# Patient Record
Sex: Female | Born: 1950
Health system: Southern US, Community
[De-identification: ages and names within clinical notes are randomized; demographics above are authoritative.]

## PROBLEM LIST (undated history)

## (undated) DIAGNOSIS — T7840XA Allergy, unspecified, initial encounter: Secondary | ICD-10-CM

## (undated) DIAGNOSIS — F419 Anxiety disorder, unspecified: Secondary | ICD-10-CM

## (undated) DIAGNOSIS — I10 Essential (primary) hypertension: Secondary | ICD-10-CM

## (undated) DIAGNOSIS — E785 Hyperlipidemia, unspecified: Secondary | ICD-10-CM

## (undated) DIAGNOSIS — E278 Other specified disorders of adrenal gland: Secondary | ICD-10-CM

## (undated) HISTORY — PX: OTHER SURGICAL HISTORY: SHX169

## (undated) HISTORY — PX: TONSILLECTOMY: SUR1361

## (undated) HISTORY — DX: Essential (primary) hypertension: I10

## (undated) HISTORY — PX: TUBAL LIGATION: SHX77

## (undated) HISTORY — DX: Other specified disorders of adrenal gland: E27.8

## (undated) HISTORY — PX: ECTOPIC PREGNANCY SURGERY: SHX613

## (undated) HISTORY — DX: Allergy, unspecified, initial encounter: T78.40XA

## (undated) HISTORY — DX: Hyperlipidemia, unspecified: E78.5

---

## 1998-02-22 ENCOUNTER — Ambulatory Visit (HOSPITAL_COMMUNITY): Admission: RE | Admit: 1998-02-22 | Discharge: 1998-02-22 | Payer: Self-pay

## 1999-02-08 ENCOUNTER — Other Ambulatory Visit: Admission: RE | Admit: 1999-02-08 | Discharge: 1999-02-08 | Payer: Self-pay | Admitting: *Deleted

## 1999-02-17 ENCOUNTER — Encounter: Admission: RE | Admit: 1999-02-17 | Discharge: 1999-02-17 | Payer: Self-pay | Admitting: Obstetrics

## 1999-02-17 ENCOUNTER — Other Ambulatory Visit: Admission: RE | Admit: 1999-02-17 | Discharge: 1999-02-17 | Payer: Self-pay | Admitting: Obstetrics

## 1999-03-14 ENCOUNTER — Encounter: Admission: RE | Admit: 1999-03-14 | Discharge: 1999-03-14 | Payer: Self-pay | Admitting: Internal Medicine

## 1999-05-24 ENCOUNTER — Other Ambulatory Visit: Admission: RE | Admit: 1999-05-24 | Discharge: 1999-05-24 | Payer: Self-pay | Admitting: Obstetrics & Gynecology

## 1999-05-24 ENCOUNTER — Encounter: Admission: RE | Admit: 1999-05-24 | Discharge: 1999-05-24 | Payer: Self-pay | Admitting: Obstetrics & Gynecology

## 1999-06-29 ENCOUNTER — Encounter: Admission: RE | Admit: 1999-06-29 | Discharge: 1999-06-29 | Payer: Self-pay | Admitting: Internal Medicine

## 1999-07-08 ENCOUNTER — Encounter: Admission: RE | Admit: 1999-07-08 | Discharge: 1999-07-08 | Payer: Self-pay | Admitting: Obstetrics & Gynecology

## 1999-10-07 ENCOUNTER — Encounter: Admission: RE | Admit: 1999-10-07 | Discharge: 1999-10-07 | Payer: Self-pay | Admitting: Internal Medicine

## 1999-10-10 ENCOUNTER — Encounter: Admission: RE | Admit: 1999-10-10 | Discharge: 1999-10-10 | Payer: Self-pay | Admitting: Obstetrics

## 2000-03-06 ENCOUNTER — Encounter: Admission: RE | Admit: 2000-03-06 | Discharge: 2000-03-06 | Payer: Self-pay | Admitting: Obstetrics & Gynecology

## 2000-04-12 ENCOUNTER — Encounter: Admission: RE | Admit: 2000-04-12 | Discharge: 2000-04-12 | Payer: Self-pay | Admitting: Internal Medicine

## 2001-11-19 ENCOUNTER — Other Ambulatory Visit: Admission: RE | Admit: 2001-11-19 | Discharge: 2001-11-19 | Payer: Self-pay | Admitting: Obstetrics and Gynecology

## 2001-11-22 ENCOUNTER — Encounter: Admission: RE | Admit: 2001-11-22 | Discharge: 2001-11-22 | Payer: Self-pay | Admitting: Obstetrics and Gynecology

## 2001-11-22 ENCOUNTER — Encounter: Payer: Self-pay | Admitting: Obstetrics and Gynecology

## 2002-11-21 ENCOUNTER — Other Ambulatory Visit: Admission: RE | Admit: 2002-11-21 | Discharge: 2002-11-21 | Payer: Self-pay | Admitting: Obstetrics and Gynecology

## 2003-11-30 ENCOUNTER — Encounter: Admission: RE | Admit: 2003-11-30 | Discharge: 2003-11-30 | Payer: Self-pay | Admitting: Internal Medicine

## 2003-12-08 ENCOUNTER — Encounter: Admission: RE | Admit: 2003-12-08 | Discharge: 2003-12-08 | Payer: Self-pay | Admitting: Internal Medicine

## 2003-12-15 ENCOUNTER — Other Ambulatory Visit: Admission: RE | Admit: 2003-12-15 | Discharge: 2003-12-15 | Payer: Self-pay | Admitting: Obstetrics and Gynecology

## 2005-03-23 ENCOUNTER — Encounter: Admission: RE | Admit: 2005-03-23 | Discharge: 2005-03-23 | Payer: Self-pay | Admitting: Family Medicine

## 2006-04-06 ENCOUNTER — Ambulatory Visit: Payer: Self-pay | Admitting: Family Medicine

## 2006-05-03 ENCOUNTER — Ambulatory Visit: Payer: Self-pay | Admitting: Family Medicine

## 2006-05-17 ENCOUNTER — Ambulatory Visit: Payer: Self-pay | Admitting: Family Medicine

## 2006-06-22 ENCOUNTER — Ambulatory Visit: Payer: Self-pay | Admitting: Family Medicine

## 2006-08-23 ENCOUNTER — Ambulatory Visit: Payer: Self-pay | Admitting: Family Medicine

## 2006-10-15 ENCOUNTER — Ambulatory Visit: Payer: Self-pay | Admitting: Family Medicine

## 2006-10-16 ENCOUNTER — Encounter: Admission: RE | Admit: 2006-10-16 | Discharge: 2006-10-16 | Payer: Self-pay | Admitting: Family Medicine

## 2006-11-15 ENCOUNTER — Ambulatory Visit: Payer: Self-pay | Admitting: Family Medicine

## 2006-11-21 ENCOUNTER — Ambulatory Visit: Payer: Self-pay | Admitting: Family Medicine

## 2006-12-06 ENCOUNTER — Ambulatory Visit: Payer: Self-pay | Admitting: Family Medicine

## 2007-01-03 ENCOUNTER — Ambulatory Visit: Payer: Self-pay | Admitting: Family Medicine

## 2007-02-19 ENCOUNTER — Ambulatory Visit: Payer: Self-pay | Admitting: Family Medicine

## 2007-02-19 ENCOUNTER — Encounter: Admission: RE | Admit: 2007-02-19 | Discharge: 2007-02-19 | Payer: Self-pay | Admitting: Obstetrics and Gynecology

## 2007-04-02 ENCOUNTER — Ambulatory Visit: Payer: Self-pay | Admitting: Family Medicine

## 2007-04-25 ENCOUNTER — Ambulatory Visit: Payer: Self-pay | Admitting: Family Medicine

## 2007-04-26 ENCOUNTER — Encounter: Admission: RE | Admit: 2007-04-26 | Discharge: 2007-04-26 | Payer: Self-pay | Admitting: Family Medicine

## 2007-05-14 ENCOUNTER — Ambulatory Visit: Payer: Self-pay | Admitting: Family Medicine

## 2007-08-15 ENCOUNTER — Ambulatory Visit: Payer: Self-pay | Admitting: Family Medicine

## 2008-02-21 ENCOUNTER — Encounter: Admission: RE | Admit: 2008-02-21 | Discharge: 2008-02-21 | Payer: Self-pay | Admitting: Internal Medicine

## 2008-05-12 ENCOUNTER — Ambulatory Visit: Payer: Self-pay | Admitting: Internal Medicine

## 2008-05-13 ENCOUNTER — Ambulatory Visit: Payer: Self-pay | Admitting: *Deleted

## 2008-05-14 ENCOUNTER — Ambulatory Visit: Payer: Self-pay | Admitting: Internal Medicine

## 2008-05-14 LAB — CONVERTED CEMR LAB
Albumin: 4.5 g/dL (ref 3.5–5.2)
Alkaline Phosphatase: 103 units/L (ref 39–117)
Calcium: 9.4 mg/dL (ref 8.4–10.5)
Cholesterol: 192 mg/dL (ref 0–200)
Creatinine, Ser: 0.78 mg/dL (ref 0.40–1.20)
Glucose, Bld: 99 mg/dL (ref 70–99)
LDL Cholesterol: 124 mg/dL — ABNORMAL HIGH (ref 0–99)
Potassium: 3.4 meq/L — ABNORMAL LOW (ref 3.5–5.3)
Total Bilirubin: 0.5 mg/dL (ref 0.3–1.2)
Triglycerides: 94 mg/dL (ref <150)
VLDL: 19 mg/dL (ref 0–40)

## 2008-06-24 ENCOUNTER — Ambulatory Visit: Payer: Self-pay | Admitting: Internal Medicine

## 2008-07-29 ENCOUNTER — Ambulatory Visit: Payer: Self-pay | Admitting: Internal Medicine

## 2008-08-19 ENCOUNTER — Ambulatory Visit: Payer: Self-pay | Admitting: Internal Medicine

## 2008-08-19 LAB — CONVERTED CEMR LAB
ALT: 9 units/L (ref 0–35)
AST: 18 units/L (ref 0–37)
Albumin: 4.7 g/dL (ref 3.5–5.2)
Alkaline Phosphatase: 113 units/L (ref 39–117)
CO2: 23 meq/L (ref 19–32)
Calcium: 9.6 mg/dL (ref 8.4–10.5)
Chloride: 104 meq/L (ref 96–112)
Creatinine, Ser: 0.86 mg/dL (ref 0.40–1.20)
Total Protein: 7.6 g/dL (ref 6.0–8.3)

## 2008-09-01 ENCOUNTER — Emergency Department (HOSPITAL_COMMUNITY): Admission: EM | Admit: 2008-09-01 | Discharge: 2008-09-01 | Payer: Self-pay | Admitting: Emergency Medicine

## 2008-09-25 ENCOUNTER — Ambulatory Visit: Payer: Self-pay | Admitting: Internal Medicine

## 2008-12-17 ENCOUNTER — Ambulatory Visit: Payer: Self-pay | Admitting: Internal Medicine

## 2009-02-19 ENCOUNTER — Telehealth (INDEPENDENT_AMBULATORY_CARE_PROVIDER_SITE_OTHER): Payer: Self-pay | Admitting: *Deleted

## 2009-02-25 ENCOUNTER — Ambulatory Visit: Payer: Self-pay | Admitting: Internal Medicine

## 2009-04-01 ENCOUNTER — Encounter: Payer: Self-pay | Admitting: Internal Medicine

## 2009-04-01 ENCOUNTER — Ambulatory Visit: Payer: Self-pay | Admitting: Family Medicine

## 2009-04-01 DIAGNOSIS — I10 Essential (primary) hypertension: Secondary | ICD-10-CM | POA: Insufficient documentation

## 2009-04-01 DIAGNOSIS — M5416 Radiculopathy, lumbar region: Secondary | ICD-10-CM | POA: Insufficient documentation

## 2009-04-01 DIAGNOSIS — M1990 Unspecified osteoarthritis, unspecified site: Secondary | ICD-10-CM | POA: Insufficient documentation

## 2009-04-23 ENCOUNTER — Ambulatory Visit: Payer: Self-pay | Admitting: Family Medicine

## 2009-04-29 ENCOUNTER — Ambulatory Visit: Payer: Self-pay | Admitting: Internal Medicine

## 2009-05-20 ENCOUNTER — Ambulatory Visit: Payer: Self-pay | Admitting: Internal Medicine

## 2009-07-01 ENCOUNTER — Ambulatory Visit: Payer: Self-pay | Admitting: Internal Medicine

## 2009-07-01 LAB — CONVERTED CEMR LAB
BUN: 17 mg/dL (ref 6–23)
Cholesterol: 205 mg/dL — ABNORMAL HIGH (ref 0–200)
HDL: 49 mg/dL (ref >39)
LDL Cholesterol: 134 mg/dL — ABNORMAL HIGH (ref 0–99)
Potassium: 4.2 meq/L (ref 3.5–5.3)
Sodium: 140 meq/L (ref 135–145)
TSH: 0.98 microintl units/mL (ref 0.350–4.500)
Total CHOL/HDL Ratio: 4.2
Triglycerides: 110 mg/dL (ref <150)
VLDL: 22 mg/dL (ref 0–40)

## 2009-07-07 ENCOUNTER — Ambulatory Visit: Payer: Self-pay | Admitting: Internal Medicine

## 2009-07-07 ENCOUNTER — Encounter: Payer: Self-pay | Admitting: Internal Medicine

## 2009-07-13 ENCOUNTER — Ambulatory Visit (HOSPITAL_COMMUNITY): Admission: RE | Admit: 2009-07-13 | Discharge: 2009-07-13 | Payer: Self-pay | Admitting: Internal Medicine

## 2009-08-11 ENCOUNTER — Ambulatory Visit: Payer: Self-pay | Admitting: Internal Medicine

## 2009-09-10 ENCOUNTER — Ambulatory Visit: Payer: Self-pay | Admitting: Family Medicine

## 2009-10-05 ENCOUNTER — Ambulatory Visit: Payer: Self-pay | Admitting: Internal Medicine

## 2009-10-07 ENCOUNTER — Ambulatory Visit: Payer: Self-pay | Admitting: Internal Medicine

## 2009-10-07 LAB — CONVERTED CEMR LAB
Cholesterol: 195 mg/dL (ref 0–200)
LDL Cholesterol: 130 mg/dL — ABNORMAL HIGH (ref 0–99)

## 2009-10-21 ENCOUNTER — Encounter (INDEPENDENT_AMBULATORY_CARE_PROVIDER_SITE_OTHER): Payer: Self-pay | Admitting: Internal Medicine

## 2009-11-29 ENCOUNTER — Ambulatory Visit: Payer: Self-pay | Admitting: Internal Medicine

## 2010-02-07 ENCOUNTER — Ambulatory Visit: Payer: Self-pay | Admitting: Internal Medicine

## 2010-02-07 LAB — CONVERTED CEMR LAB
CO2: 18 meq/L — ABNORMAL LOW (ref 19–32)
Calcium: 10.1 mg/dL (ref 8.4–10.5)
Glucose, Bld: 90 mg/dL (ref 70–99)
Hgb A1c MFr Bld: 5.9 % (ref 4.6–6.1)
Potassium: 4 meq/L (ref 3.5–5.3)

## 2010-05-17 ENCOUNTER — Ambulatory Visit: Payer: Self-pay | Admitting: Internal Medicine

## 2010-06-21 ENCOUNTER — Ambulatory Visit: Payer: Self-pay | Admitting: Internal Medicine

## 2010-06-24 ENCOUNTER — Ambulatory Visit (HOSPITAL_COMMUNITY): Admission: RE | Admit: 2010-06-24 | Discharge: 2010-06-24 | Payer: Self-pay | Admitting: Internal Medicine

## 2010-07-22 ENCOUNTER — Ambulatory Visit (HOSPITAL_COMMUNITY): Admission: RE | Admit: 2010-07-22 | Discharge: 2010-07-22 | Payer: Self-pay | Admitting: Family Medicine

## 2010-09-26 ENCOUNTER — Ambulatory Visit (HOSPITAL_COMMUNITY): Admission: RE | Admit: 2010-09-26 | Discharge: 2010-09-26 | Payer: Self-pay | Admitting: Internal Medicine

## 2010-11-24 ENCOUNTER — Encounter (INDEPENDENT_AMBULATORY_CARE_PROVIDER_SITE_OTHER): Payer: Self-pay | Admitting: Family Medicine

## 2010-11-24 LAB — CONVERTED CEMR LAB
ALT: 9 units/L (ref 0–35)
AST: 16 units/L (ref 0–37)
CO2: 27 meq/L (ref 19–32)
Cholesterol: 195 mg/dL (ref 0–200)
Creatinine, Ser: 0.82 mg/dL (ref 0.40–1.20)
HDL: 51 mg/dL (ref >39)
LDL Cholesterol: 124 mg/dL — ABNORMAL HIGH (ref 0–99)
Sodium: 138 meq/L (ref 135–145)
Total Bilirubin: 0.9 mg/dL (ref 0.3–1.2)
Total Protein: 7.6 g/dL (ref 6.0–8.3)
Triglycerides: 101 mg/dL (ref <150)
VLDL: 20 mg/dL (ref 0–40)

## 2010-11-29 ENCOUNTER — Encounter: Payer: Self-pay | Admitting: Cardiology

## 2010-11-29 ENCOUNTER — Ambulatory Visit
Admission: RE | Admit: 2010-11-29 | Discharge: 2010-11-29 | Payer: Self-pay | Source: Home / Self Care | Attending: Cardiology | Admitting: Cardiology

## 2010-11-29 DIAGNOSIS — R079 Chest pain, unspecified: Secondary | ICD-10-CM | POA: Insufficient documentation

## 2010-12-08 NOTE — Assessment & Plan Note (Signed)
Summary: chest pain/mt   Visit Type:  Initial Consult Primary Provider:  [facility]  CC:  chest pain.  History of Present Illness: The patient is seen in consultation for evaluation of chest pain.  She has no known cardiovascular disease.  She does have hypertension. she does not smoke.  She does not have diabetes.  Her mother had coronary disease and underwent CABG.  Her LDL is 124.  HDL is 51.  She says that she has chest discomfort at times.  It can occur at rest or with stress.  There is no nausea vomiting or diaphoresis.  She's had no syncope or presyncope.  The discomfort occurs substernal.  It is short in duration.  Preventive Screening-Counseling & Management  Alcohol-Tobacco     Smoking Status: never  Caffeine-Diet-Exercise     Does Patient Exercise: yes      Drug Use:  no.    Current Medications (verified): 1)  Flexeril 10 Mg Tabs (Cyclobenzaprine Hcl) .... Take 1 Tablet By Mouth Every 8 Hours As Needed 2)  Hydrochlorothiazide 25 Mg Tabs (Hydrochlorothiazide) .... Take One Tablet By Mouth Daily. 3)  Fish Oil   Oil (Fish Oil) .... Three Times A Day 4)  Krill Oil 1000 Mg Caps (Krill Oil) .... Once Daily 5)  Vitamin D3 5000 Unit Caps (Cholecalciferol) .... Once Daily 6)  Vitamin B-12 500 Mcg  Tabs (Cyanocobalamin) .... Once Daily 7)  Vitamin C 500 Mg  Tabs (Ascorbic Acid) .... Once Daily 8)  Gabapentin 300 Mg Caps (Gabapentin) .... Take 1 Capsule By Mouth Once A Day 9)  Potassium Chloride Crys Cr 20 Meq Cr-Tabs (Potassium Chloride Crys Cr) .... Take One Tablet By Mouth Daily 10)  Losartan Potassium 100 Mg Tabs (Losartan Potassium) .... Take 1 Tablet By Mouth Once A Day 11)  Amlodipine Besylate 10 Mg Tabs (Amlodipine Besylate) .... Take One Tablet By Mouth Daily 12)  Tramadol Hcl 50 Mg Tabs (Tramadol Hcl) .... Take 1 Tablet By Mouth Three Times A Day  Allergies (verified): 1)  ! Ace Inhibitors  Past History:  Past Medical History: Hypertension Osteoarthritis Back  pain Lipitor.... leg pain Chest pain Thyroid cyst   September 26, 2010( insignificant)  Family History: Family History of Cancer:  Family History of Coronary Artery Disease:  Family History of Diabetes:  Family History of Hypertension:   Social History: Divorced  Tobacco Use - No.  Alcohol Use - yes -- ocasional Regular Exercise - yes Drug Use - no Smoking Status:  never Does Patient Exercise:  yes Drug Use:  no  Review of Systems       The patient denies fever, chills, headache, sweats, rash, change in vision, change in hearing, cough, nausea vomiting, urinary symptoms.  All of the systems are reviewed and are negative  Vital Signs:  Patient profile:   60 year old female Height:      63 inches Weight:      187 pounds BMI:     33.25 Pulse rate:   84 / minute BP sitting:   142 / 78  (left arm) Cuff size:   regular  Vitals Entered By: Hardin Negus, RMA (November 29, 2010 2:07 PM)  Physical Exam  General:  patient is overweight but stable. Head:  head is atraumatic. Eyes:  no xanthelasma. Neck:  no jugular venous distention. Chest Wall:  no chest wall tenderness. Lungs:  lungs are clear.  Respiratory effort is not labored. Heart:  cardiac exam reveals a normal S2.  No clicks  or significant murmurs. Abdomen:  abdomen is soft. Msk:  no musculoskeletal deformities. Extremities:  no peripheral edema. Skin:  no skin rashes. Psych:  patient is oriented to person time and place.  Affect is normal.   Impression & Recommendations:  Problem # 1:  CHEST PAIN (ICD-786.50)  The patient does have some risk factors for coronary disease.  Her Framingham risk score for significant coronary disease in the next 10 years is less than 10%.  She is at low risk.  Her pain is not typical.  EKG is done and reviewed by me.  There is no diagnostic abnormalities.  We will proceed with a standard exercise tolerance test.  Orders: Treadmill (Treadmill)  Problem # 2:  HYPERTENSION  (ICD-401.9) Blood pressure is controlled today.  No change in therapy.  Problem # 3:  BACK PAIN, LUMBAR, WITH RADICULOPATHY (ICD-724.4) I feel that her back pain is not vascular in origin.  I will see her back to proceed with the treadmill.  Other Orders: EKG w/ Interpretation (93000)  Patient Instructions: 1)  Your physician has requested that you have an exercise tolerance test.  For further information please visit [email or web address]  Please also follow instruction sheet, as given.

## 2010-12-16 ENCOUNTER — Ambulatory Visit (HOSPITAL_COMMUNITY)
Admission: RE | Admit: 2010-12-16 | Discharge: 2010-12-16 | Disposition: A | Discharge status: Home / Self Care | Payer: Self-pay | Source: Ambulatory Visit | Attending: Family Medicine | Admitting: Family Medicine

## 2010-12-16 ENCOUNTER — Other Ambulatory Visit: Payer: Self-pay | Admitting: Family Medicine

## 2010-12-16 DIAGNOSIS — R52 Pain, unspecified: Secondary | ICD-10-CM

## 2010-12-16 DIAGNOSIS — R0789 Other chest pain: Secondary | ICD-10-CM | POA: Insufficient documentation

## 2010-12-19 ENCOUNTER — Encounter: Payer: Self-pay | Admitting: Cardiology

## 2010-12-19 ENCOUNTER — Encounter (INDEPENDENT_AMBULATORY_CARE_PROVIDER_SITE_OTHER): Payer: PRIVATE HEALTH INSURANCE | Admitting: Cardiology

## 2010-12-19 ENCOUNTER — Encounter (INDEPENDENT_AMBULATORY_CARE_PROVIDER_SITE_OTHER): Payer: PRIVATE HEALTH INSURANCE

## 2010-12-19 DIAGNOSIS — R0989 Other specified symptoms and signs involving the circulatory and respiratory systems: Secondary | ICD-10-CM

## 2010-12-19 DIAGNOSIS — R079 Chest pain, unspecified: Secondary | ICD-10-CM

## 2011-03-07 ENCOUNTER — Other Ambulatory Visit (HOSPITAL_COMMUNITY): Payer: Self-pay | Admitting: Family Medicine

## 2011-03-07 DIAGNOSIS — Z1231 Encounter for screening mammogram for malignant neoplasm of breast: Secondary | ICD-10-CM

## 2011-07-24 ENCOUNTER — Ambulatory Visit (HOSPITAL_COMMUNITY)
Admission: RE | Admit: 2011-07-24 | Discharge: 2011-07-24 | Disposition: A | Discharge status: Home / Self Care | Payer: Self-pay | Source: Ambulatory Visit | Attending: Family Medicine | Admitting: Family Medicine

## 2011-07-24 DIAGNOSIS — Z1231 Encounter for screening mammogram for malignant neoplasm of breast: Secondary | ICD-10-CM | POA: Insufficient documentation

## 2011-08-07 ENCOUNTER — Other Ambulatory Visit: Payer: Self-pay | Admitting: Family Medicine

## 2011-10-26 ENCOUNTER — Other Ambulatory Visit: Payer: Self-pay | Admitting: Family Medicine

## 2011-10-26 DIAGNOSIS — N644 Mastodynia: Secondary | ICD-10-CM

## 2011-11-13 ENCOUNTER — Ambulatory Visit
Admission: RE | Admit: 2011-11-13 | Discharge: 2011-11-13 | Disposition: A | Discharge status: Home / Self Care | Payer: Self-pay | Source: Ambulatory Visit | Attending: Family Medicine | Admitting: Family Medicine

## 2011-11-13 DIAGNOSIS — N644 Mastodynia: Secondary | ICD-10-CM

## 2012-06-03 ENCOUNTER — Other Ambulatory Visit: Payer: Self-pay | Admitting: Family Medicine

## 2012-07-30 ENCOUNTER — Encounter: Payer: Self-pay | Admitting: Internal Medicine

## 2012-07-30 ENCOUNTER — Ambulatory Visit (INDEPENDENT_AMBULATORY_CARE_PROVIDER_SITE_OTHER): Payer: Self-pay | Admitting: Internal Medicine

## 2012-07-30 VITALS — BP 141/85 | HR 79 | Temp 98.1°F | Ht 63.0 in | Wt 188.4 lb

## 2012-07-30 DIAGNOSIS — M899 Disorder of bone, unspecified: Secondary | ICD-10-CM

## 2012-07-30 DIAGNOSIS — N644 Mastodynia: Secondary | ICD-10-CM | POA: Insufficient documentation

## 2012-07-30 DIAGNOSIS — Z23 Encounter for immunization: Secondary | ICD-10-CM

## 2012-07-30 DIAGNOSIS — I10 Essential (primary) hypertension: Secondary | ICD-10-CM

## 2012-07-30 DIAGNOSIS — R209 Unspecified disturbances of skin sensation: Secondary | ICD-10-CM

## 2012-07-30 DIAGNOSIS — Z0000 Encounter for general adult medical examination without abnormal findings: Secondary | ICD-10-CM

## 2012-07-30 DIAGNOSIS — M8580 Other specified disorders of bone density and structure, unspecified site: Secondary | ICD-10-CM | POA: Insufficient documentation

## 2012-07-30 DIAGNOSIS — Z87898 Personal history of other specified conditions: Secondary | ICD-10-CM | POA: Insufficient documentation

## 2012-07-30 LAB — CBC
MCH: 29.6 pg (ref 26.0–34.0)
MCHC: 33.8 g/dL (ref 30.0–36.0)
Platelets: 309 10*3/uL (ref 150–400)
RBC: 4.76 MIL/uL (ref 3.87–5.11)
WBC: 5.9 10*3/uL (ref 4.0–10.5)

## 2012-07-30 MED ORDER — AMLODIPINE BESYLATE 10 MG PO TABS: 10.0000 mg | ORAL_TABLET | Freq: Every day | ORAL | Status: DC

## 2012-07-30 MED ORDER — LOSARTAN POTASSIUM-HCTZ 100-25 MG PO TABS: 1.0000 | ORAL_TABLET | Freq: Every day | ORAL | Status: DC

## 2012-07-30 NOTE — Patient Instructions (Signed)
Apply bacitracin ointment to right ear daily for 5-7 days and see if that helps with pain, if no relief call or come to clinic. If severe, go to ED  Take benadryl at night as needed to help with allergies and may help with sleep  We increased the dose of your Losartan-HCTZ medication: 100-25mg  tablet daily and keep taking amlodipine 10mg  daily  Follow up b/l breast ultrasound  Flu vaccine and Tdap given today  Pap smear next visit--follow up one month  Decrease salt intake in your diet and continue medication and continue exercise--try to monitor BP at home  Hypertension As your heart beats, it forces blood through your arteries. This force is your blood pressure. If the pressure is too high, it is called hypertension (HTN) or high blood pressure. HTN is dangerous because you may have it and not know it. High blood pressure may mean that your heart has to work harder to pump blood. Your arteries may be narrow or stiff. The extra work puts you at risk for heart disease, stroke, and other problems.  Blood pressure consists of two numbers, a higher number over a lower, 110/72, for example. It is stated as "110 over 72." The ideal is below 120 for the top number (systolic) and under 80 for the bottom (diastolic). Write down your blood pressure today. You should pay close attention to your blood pressure if you have certain conditions such as:  Heart failure.   Prior heart attack.   Diabetes   Chronic kidney disease.   Prior stroke.   Multiple risk factors for heart disease.  To see if you have HTN, your blood pressure should be measured while you are seated with your arm held at the level of the heart. It should be measured at least twice. A one-time elevated blood pressure reading (especially in the Emergency Department) does not mean that you need treatment. There may be conditions in which the blood pressure is different between your right and left arms. It is important to see your  caregiver soon for a recheck. Most people have essential hypertension which means that there is not a specific cause. This type of high blood pressure may be lowered by changing lifestyle factors such as:  Stress.   Smoking.   Lack of exercise.   Excessive weight.   Drug/tobacco/alcohol use.   Eating less salt.  Most people do not have symptoms from high blood pressure until it has caused damage to the body. Effective treatment can often prevent, delay or reduce that damage. TREATMENT  When a cause has been identified, treatment for high blood pressure is directed at the cause. There are a large number of medications to treat HTN. These fall into several categories, and your caregiver will help you select the medicines that are best for you. Medications may have side effects. You should review side effects with your caregiver. If your blood pressure stays high after you have made lifestyle changes or started on medicines,   Your medication(s) may need to be changed.   Other problems may need to be addressed.   Be certain you understand your prescriptions, and know how and when to take your medicine.   Be sure to follow up with your caregiver within the time frame advised (usually within two weeks) to have your blood pressure rechecked and to review your medications.   If you are taking more than one medicine to lower your blood pressure, make sure you know how and at what  times they should be taken. Taking two medicines at the same time can result in blood pressure that is too low.  SEEK IMMEDIATE MEDICAL CARE IF:  You develop a severe headache, blurred or changing vision, or confusion.   You have unusual weakness or numbness, or a faint feeling.   You have severe chest or abdominal pain, vomiting, or breathing problems.  MAKE SURE YOU:   Understand these instructions.   Will watch your condition.   Will get help right away if you are not doing well or get worse.  Document  Released: 10/23/2005 Document Revised: 10/12/2011 Document Reviewed: 06/12/2008 [facility] Patient Information 2012 [place], [place].

## 2012-07-30 NOTE — Assessment & Plan Note (Addendum)
History of chronic bilateral breast tenderness. Status post abscess drainage right breast several years ago. Currently claims return of breast tenderness more on the lateral surfaces of bilateral breast. Possible palpable cysts on breast exam bilaterally lower quadrants.Mammogram January 2013 negative. Usually gets mammograms every September. Presented to clinic today with letter from [facility] Center claiming she is due for her next mammogram in September 2013.  -Bilateral ultrasound if applicable, followup mammogram -Continue to monitor

## 2012-07-30 NOTE — Assessment & Plan Note (Addendum)
Blood pressure in clinic today 115/82. On amlodipine 10 mg daily and Hyzaar 50-12.5 mg daily. Counseled her on the following a salt restricted diet, and continued exercise.  -Increase Hyzaar 200-25 mg daily, continue Norvasc 10 mg--monitor closely due to hx of angioedema with ace inhibitor but claims to tolerate losartan with no issues. -Encouraged to check blood pressure at home -Follow a salt restricted diet -Continue to monitor

## 2012-07-30 NOTE — Assessment & Plan Note (Signed)
Stable at this time. No complaints of pain.  -Continue to monitor

## 2012-07-30 NOTE — Progress Notes (Signed)
Subjective:   Patient ID: Eileen Cameron female   DOB: 07/28/1951 61 y.o.   MRN: [id number]  HPI: [name] is a 61 y.o. very pleasant African American female past medical history of hypertension and hyperlipidemia presenting to the clinic today for new patient evaluation. [name] was a previous [facility] patient.  She has been taking amlodipine 10 mg and Hyzaar 50-12.5 mg daily for her blood pressure and has been taking fish oil, Krill oil, CoQ10 which she claims is for her high cholesterol. She also takes vitamin B12 and a multivitamin daily. She claims she was tried and on a statin in the past but had severe bilateral leg pain. Her main complaint today is right ear pain, scratchy throat and eyes, and feeling tired for the past 2 weeks. She also claims to have "honking"=coughing up occasional phlegm at times. She claims she had menopause approximately 10 years ago but still has occasional hot flashes and gets warm especially at night. She wakes up at least 2 times in the middle of the night to go to the bathroom. Her thyroid was last checked in 2010 with a TSH of 0.98, she was found to have a small thyroid cyst in 2011. Her last noted LDL was 124 January 2013.  She claims to snore at night and has occasionally woken up feeling that she can't catch her breath. She says that she's been told of a sleep study in the past but was not sure if it is covered by [employer] card. We will investigate this further and order if possible refer her for sleep study evaluation.  Otherwise Eileen Cameron has no major complaints at this time. She denies any fever, chills, nausea, vomiting, diarrhea, chest pain, shortness of breath, abdominal pain, or any urinary complaints at this time.    Of note, [name] claims to have had a colonoscopy in the past with no abnormal findings.  We will try to find prior records, if none found, will need to refer again for screening.   Past Medical History  Diagnosis Date  .  Hypertension   . Hyperlipidemia   . Allergy     seasonal allergies, ace inhibitors--angioedema   Current Outpatient Prescriptions  Medication Sig Dispense Refill  . amLODipine (NORVASC) 10 MG tablet Take 1 tablet (10 mg total) by mouth daily.  30 tablet  5  . Coenzyme Q10 (CO Q-10) 100 MG CAPS Take by mouth.      . Cyanocobalamin (VITAMIN B-12) 2500 MCG SUBL Place under the tongue.      . Multiple Vitamins-Minerals (CENTRUM SILVER ADULT 50+ PO) Take by mouth.      . Omega-3 Fatty Acids (FISH OIL) 1000 MG CAPS Take by mouth.      . OMEGA-3 KRILL OIL 300 MG CAPS Take by mouth daily.      Marland Kitchen DISCONTD: amLODipine (NORVASC) 10 MG tablet Take 10 mg by mouth daily.      Marland Kitchen DISCONTD: losartan-hydrochlorothiazide (HYZAAR) 50-12.5 MG per tablet Take 1 tablet by mouth daily.      Marland Kitchen losartan-hydrochlorothiazide (HYZAAR) 100-25 MG per tablet Take 1 tablet by mouth daily.  30 tablet  5   Family History  Problem Relation Age of Onset  . Heart disease Mother   . Diabetes Mother   . Hypertension Sister   . Heart disease Maternal Grandmother    History   Social History  . Marital Status: Divorced    Spouse Name: N/A    Number of Children: N/A  .  Years of Education: N/A   Social History Main Topics  . Smoking status: Never Smoker   . Smokeless tobacco: None  . Alcohol Use: No  . Drug Use: No  . Sexually Active: No   Other Topics Concern  . None   Social History Narrative  . None   Review of Systems: Constitutional: Denies fever, chills, diaphoresis, appetite change and fatigue.  HEENT: R Ear pain, scratchy throat and eyes. Denies photophobia, eye pain, redness, hearing loss, congestion, sore throat, rhinorrhea, sneezing, mouth sores, trouble swallowing, neck pain, neck stiffness and tinnitus.   Respiratory: Coughs up phlegm at times. Denies SOB, DOE, chest tightness,  and wheezing.   Cardiovascular: Denies chest pain, palpitations and leg swelling.  Gastrointestinal: Denies nausea,  vomiting, abdominal pain, diarrhea, constipation, blood in stool and abdominal distention.  Genitourinary: Denies dysuria, urgency, frequency, hematuria, flank pain and difficulty urinating.  Musculoskeletal: Hx of back pain. Denies myalgias, joint swelling, arthralgias and gait problem.  Skin: Denies pallor, rash and wound.  Neurological: Denies dizziness, seizures, syncope, weakness, light-headedness, numbness and headaches.  Psychiatric/Behavioral: Sleep disturbance. Denies suicidal ideation, mood changes, confusion, nervousness, and agitation  Objective:  Physical Exam: Filed Vitals:   07/30/12 1507 07/30/12 1623  BP: 158/82 141/85  Pulse: 78 79  Temp: 98.1 F (36.7 C)   TempSrc: Oral   Height: 5\' 3"  (1.6 m)   Weight: 188 lb 6.4 oz (85.458 kg)   SpO2: 98%    Constitutional: Vital signs reviewed.  Patient is a well-developed and well-nourished female in no acute distress and cooperative with exam. Alert and oriented x3.  Head: Normocephalic and atraumatic Ear: TM normal bilaterally, pimple noted in the right middle ear 4:00 position, non-oozing.  Mouth: no erythema or exudates, MMM Eyes: PERRLA, EOMI, conjunctivae normal, No scleral icterus.  Neck: Supple, Trachea midline normal ROM.  Cardiovascular: RRR, S1 normal, S2 normal, pulses symmetric and intact bilaterally Pulmonary/Chest: CTAB, no wheezes, rales, or rhonchi Abdominal: Soft. Non-tender, non-distended, bowel sounds are normal, no masses, organomegaly, or guarding present.  GU: no CVA tenderness Musculoskeletal: No joint deformities, erythema, or stiffness, ROM full and nontender, bilateral medial malleolus edema, nonpitting, non-tender. Hematology: no cervical, inginal, or axillary adenopathy.  Neurological: A&O x3, Strength is normal and symmetric bilaterally, cranial nerve II-XII are grossly intact, no focal motor deficit, sensory intact to light touch bilaterally.  Skin: Warm, dry and intact. No rash, cyanosis, or  clubbing.  Psychiatric: Normal mood and affect. speech and behavior is normal. Judgment and thought content normal. Cognition and memory are normal.   Assessment & Plan:  Discuss case with Dr. Lonzo Cloud  Hypertension-increase Hyzaar to 100-25 mg daily, continue Norvasc 10 mg daily, continue to monitor blood pressure  Right ear pain-apply bacitracin ointment to area as needed or at least 5-7 days, if symptoms worsen call or come to clinic  Scratchy throat and eyes-possibly secondary to allergies, has tried Claritin in the past to no relief, advised to try Benadryl as needed  Followup labs, return to clinic in one month

## 2012-07-31 LAB — COMPLETE METABOLIC PANEL WITH GFR
ALT: 8 U/L (ref 0–35)
Alkaline Phosphatase: 105 U/L (ref 39–117)
BUN: 16 mg/dL (ref 6–23)
CO2: 27 mEq/L (ref 19–32)
GFR, Est African American: >89 mL/min
GFR, Est Non African American: 79 mL/min
Glucose, Bld: 94 mg/dL (ref 70–99)
Sodium: 140 mEq/L (ref 135–145)
Total Bilirubin: 0.4 mg/dL (ref 0.3–1.2)

## 2012-07-31 LAB — T4, FREE: Free T4: 1.27 ng/dL (ref 0.80–1.80)

## 2012-07-31 NOTE — Progress Notes (Signed)
INTERNAL MEDICINE TEACHING ATTENDING ADDENDUM Lonzo Cloud , MD: I personally saw and evaluated Eileen Cameron  in this clinic visit in conjunction with the resident, Dr. Virgina Organ. I have discussed the patient's plan of care with Dr. Virgina Organ during this visit. I have confirmed the physical exam findings and have read and agree with the clinic note including the plan.

## 2012-08-02 ENCOUNTER — Ambulatory Visit
Admission: RE | Admit: 2012-08-02 | Discharge: 2012-08-02 | Disposition: A | Discharge status: Home / Self Care | Payer: Self-pay | Source: Ambulatory Visit | Attending: Internal Medicine | Admitting: Internal Medicine

## 2012-08-02 DIAGNOSIS — N644 Mastodynia: Secondary | ICD-10-CM

## 2012-08-26 ENCOUNTER — Ambulatory Visit (INDEPENDENT_AMBULATORY_CARE_PROVIDER_SITE_OTHER): Payer: Self-pay | Admitting: Internal Medicine

## 2012-08-26 ENCOUNTER — Encounter: Payer: Self-pay | Admitting: Internal Medicine

## 2012-08-26 VITALS — BP 136/86 | HR 82 | Temp 97.2°F | Ht 63.0 in | Wt 189.4 lb

## 2012-08-26 DIAGNOSIS — G4700 Insomnia, unspecified: Secondary | ICD-10-CM

## 2012-08-26 DIAGNOSIS — Z0000 Encounter for general adult medical examination without abnormal findings: Secondary | ICD-10-CM | POA: Insufficient documentation

## 2012-08-26 DIAGNOSIS — I10 Essential (primary) hypertension: Secondary | ICD-10-CM

## 2012-08-26 DIAGNOSIS — M899 Disorder of bone, unspecified: Secondary | ICD-10-CM

## 2012-08-26 DIAGNOSIS — M8580 Other specified disorders of bone density and structure, unspecified site: Secondary | ICD-10-CM

## 2012-08-26 MED ORDER — TRAZODONE HCL 50 MG PO TABS: 25.0000 mg | ORAL_TABLET | Freq: Every day | ORAL | Status: DC

## 2012-08-26 MED ORDER — CETIRIZINE HCL 10 MG PO CHEW: 10.0000 mg | CHEWABLE_TABLET | Freq: Every day | ORAL | Status: DC

## 2012-08-26 MED ORDER — CALCIUM CITRATE-VITAMIN D 315-200 MG-UNIT PO TABS: 2.0000 | ORAL_TABLET | Freq: Two times a day (BID) | ORAL | Status: DC

## 2012-08-26 NOTE — Assessment & Plan Note (Signed)
The patient's last Dexa shows a Tmin of -1.5, consistent with osteopenia -start taking calcium-vit D 1200-800 mg consistently daily -patient would benefit from repeat DEXA.  Patient declines at this time, but agrees to rediscuss at next visit

## 2012-08-26 NOTE — Assessment & Plan Note (Signed)
BP within goal after increasing losartan-HCTZ at last visit -continue current regimen

## 2012-08-26 NOTE — Progress Notes (Signed)
HPI The patient is a 61 y.o. yo female with a history of HTN, osteopenia, presenting for a follow-up visit.  The patient notes difficulty sleeping.  She describes it as difficulty falling asleep at night, and occasionally awakening at 3-4 am unable to go back to sleep.  She notes no racing or anxious thoughts.  The patient has tried taking benadryl with no relief.  She has tried valium short-term in the past with some success.  The patient had a DEXA 10 years, consistent with osteopenia.  She has taken calcium and vitamin D occasionally at home, but is unsure of the dose.  The patient has a history of HTN.  At her last visit, her losartan-HCTZ was increased to 100-25 mg.  Today her repeat BP is 136/86.  The patient has had a mammogram since her last visit, which was normal.  ROS: General: no fevers, chills, changes in weight, changes in appetite Skin: no rash HEENT: no blurry vision, hearing changes, sore throat Pulm: no dyspnea, coughing, wheezing CV: no chest pain, palpitations, shortness of breath Abd: no abdominal pain, nausea/vomiting, diarrhea/constipation GU: no dysuria, hematuria, polyuria Ext: no arthralgias, myalgias Neuro: no weakness, numbness, or tingling  Filed Vitals:   08/26/12 1018  BP: 136/86  Pulse:   Temp:     PEX General: alert, cooperative, and in no apparent distress HEENT: pupils equal round and reactive to light, vision grossly intact, oropharynx clear and non-erythematous  Neck: supple, no lymphadenopathy Lungs: clear to ascultation bilaterally, normal work of respiration, no wheezes, rales, ronchi Heart: regular rate and rhythm, no murmurs, gallops, or rubs Abdomen: soft, non-tender, non-distended, normal bowel sounds Msk: no joint edema, warmth, or erythema Extremities: no cyanosis, clubbing, or edema Neurologic: alert & oriented X3, cranial nerves II-XII intact, strength grossly intact, sensation intact to light touch  Current Outpatient  Prescriptions on File Prior to Visit  Medication Sig Dispense Refill  . amLODipine (NORVASC) 10 MG tablet Take 1 tablet (10 mg total) by mouth daily.  30 tablet  5  . Coenzyme Q10 (CO Q-10) 100 MG CAPS Take by mouth.      . Cyanocobalamin (VITAMIN B-12) 2500 MCG SUBL Place under the tongue.      Marland Kitchen losartan-hydrochlorothiazide (HYZAAR) 100-25 MG per tablet Take 1 tablet by mouth daily.  30 tablet  5  . Multiple Vitamins-Minerals (CENTRUM SILVER ADULT 50+ PO) Take by mouth.      . Omega-3 Fatty Acids (FISH OIL) 1000 MG CAPS Take by mouth.      . OMEGA-3 KRILL OIL 300 MG CAPS Take by mouth daily.        Assessment/Plan

## 2012-08-26 NOTE — Patient Instructions (Signed)
For your difficulty sleeping, try the following tips to help you sleep: -Avoid caffeine after noon (12:00 pm) -Avoid bright lights, such as a TV or computer screen, in the 2-3 hours before bedtime -Go to bed at the same time each night to get your body into a healthy sleep rhythm -Use the bedroom only for sleeping (do not watch TV, eat, or do other stimulating activities in the bedroom)  If you still have difficulty sleeping, you can try Trazodone, 1/2 of a tablet every evening.  Your bone scan 10 years ago shows slightly weaker bones, a condition called Osteopenia. -start taking Calcium and Vitamin D, 2 tablets twice per day -we recommend scheduling a repeat Bone Scan (DEXA) in the near future  Please return for a follow-up visit in 5-6 months.   Insomnia Insomnia is frequent trouble falling and/or staying asleep. Insomnia can be a long term problem or a short term problem. Both are common. Insomnia can be a short term problem when the wakefulness is related to a certain stress or worry. Long term insomnia is often related to ongoing stress during waking hours and/or poor sleeping habits. Overtime, sleep deprivation itself can make the problem worse. Every little thing feels more severe because you are overtired and your ability to cope is decreased. CAUSES   Stress, anxiety, and depression.  Poor sleeping habits.  Distractions such as TV in the bedroom.  Naps close to bedtime.  Engaging in emotionally charged conversations before bed.  Technical reading before sleep.  Alcohol and other sedatives. They may make the problem worse. They can hurt normal sleep patterns and normal dream activity.  Stimulants such as caffeine for several hours prior to bedtime.  Pain syndromes and shortness of breath can cause insomnia.  Exercise late at night.  Changing time zones may cause sleeping problems (jet lag). It is sometimes helpful to have someone observe your sleeping patterns. They  should look for periods of not breathing during the night (sleep apnea). They should also look to see how long those periods last. If you live alone or observers are uncertain, you can also be observed at a sleep clinic where your sleep patterns will be professionally monitored. Sleep apnea requires a checkup and treatment. Give your caregivers your medical history. Give your caregivers observations your family has made about your sleep.  SYMPTOMS   Not feeling rested in the morning.  Anxiety and restlessness at bedtime.  Difficulty falling and staying asleep. TREATMENT   Your caregiver may prescribe treatment for an underlying medical disorders. Your caregiver can give advice or help if you are using alcohol or other drugs for self-medication. Treatment of underlying problems will usually eliminate insomnia problems.  Medications can be prescribed for short time use. They are generally not recommended for lengthy use.  Over-the-counter sleep medicines are not recommended for lengthy use. They can be habit forming.  You can promote easier sleeping by making lifestyle changes such as:  Using relaxation techniques that help with breathing and reduce muscle tension.  Exercising earlier in the day.  Changing your diet and the time of your last meal. No night time snacks.  Establish a regular time to go to bed.  Counseling can help with stressful problems and worry.  Soothing music and white noise may be helpful if there are background noises you cannot remove.  Stop tedious detailed work at least one hour before bedtime. HOME CARE INSTRUCTIONS   Keep a diary. Inform your caregiver about your progress. This  includes any medication side effects. See your caregiver regularly. Take note of:  Times when you are asleep.  Times when you are awake during the night.  The quality of your sleep.  How you feel the next day. This information will help your caregiver care for you.  Get out  of bed if you are still awake after 15 minutes. Read or do some quiet activity. Keep the lights down. Wait until you feel sleepy and go back to bed.  Keep regular sleeping and waking hours. Avoid naps.  Exercise regularly.  Avoid distractions at bedtime. Distractions include watching television or engaging in any intense or detailed activity like attempting to balance the household checkbook.  Develop a bedtime ritual. Keep a familiar routine of bathing, brushing your teeth, climbing into bed at the same time each night, listening to soothing music. Routines increase the success of falling to sleep faster.  Use relaxation techniques. This can be using breathing and muscle tension release routines. It can also include visualizing peaceful scenes. You can also help control troubling or intruding thoughts by keeping your mind occupied with boring or repetitive thoughts like the old concept of counting sheep. You can make it more creative like imagining planting one beautiful flower after another in your backyard garden.  During your day, work to eliminate stress. When this is not possible use some of the previous suggestions to help reduce the anxiety that accompanies stressful situations. MAKE SURE YOU:   Understand these instructions.  Will watch your condition.  Will get help right away if you are not doing well or get worse. Document Released: 10/20/2000 Document Revised: 01/15/2012 Document Reviewed: 11/20/2007 [facility] Patient Information 2013 [place], [place].

## 2012-08-26 NOTE — Assessment & Plan Note (Signed)
Patient states she has had a colonoscopy within the last 10 years.  I called [facility] today, but they do not have a colonoscopy on file for her.  Will follow-up with patient at next visit to determine where this occurred.

## 2012-08-26 NOTE — Assessment & Plan Note (Signed)
The patient notes occasional difficulty sleeping.  We discussed maintaining appropriate sleep hygiene, and the patient was given information to read at home about maintaining a proper sleep schedule. -first line, try sleep hygiene -if still unable to sleep, gave prescription for trazodone 25 mg qhs prn

## 2012-10-09 ENCOUNTER — Emergency Department (HOSPITAL_COMMUNITY)
Admission: EM | Admit: 2012-10-09 | Discharge: 2012-10-09 | Disposition: A | Discharge status: Home / Self Care | Payer: Self-pay | Attending: Emergency Medicine | Admitting: Emergency Medicine

## 2012-10-09 ENCOUNTER — Encounter (HOSPITAL_COMMUNITY): Payer: Self-pay | Admitting: Emergency Medicine

## 2012-10-09 ENCOUNTER — Emergency Department (HOSPITAL_COMMUNITY): Payer: Self-pay

## 2012-10-09 DIAGNOSIS — R05 Cough: Secondary | ICD-10-CM | POA: Insufficient documentation

## 2012-10-09 DIAGNOSIS — R059 Cough, unspecified: Secondary | ICD-10-CM | POA: Insufficient documentation

## 2012-10-09 DIAGNOSIS — I10 Essential (primary) hypertension: Secondary | ICD-10-CM | POA: Insufficient documentation

## 2012-10-09 DIAGNOSIS — J020 Streptococcal pharyngitis: Secondary | ICD-10-CM | POA: Insufficient documentation

## 2012-10-09 DIAGNOSIS — E785 Hyperlipidemia, unspecified: Secondary | ICD-10-CM | POA: Insufficient documentation

## 2012-10-09 DIAGNOSIS — Z79899 Other long term (current) drug therapy: Secondary | ICD-10-CM | POA: Insufficient documentation

## 2012-10-09 DIAGNOSIS — R52 Pain, unspecified: Secondary | ICD-10-CM | POA: Insufficient documentation

## 2012-10-09 LAB — URINALYSIS, ROUTINE W REFLEX MICROSCOPIC
Bilirubin Urine: NEGATIVE
Glucose, UA: NEGATIVE mg/dL
Hgb urine dipstick: NEGATIVE
Nitrite: NEGATIVE
Specific Gravity, Urine: 1.021 (ref 1.005–1.030)
pH: 8 (ref 5.0–8.0)

## 2012-10-09 LAB — URINE MICROSCOPIC-ADD ON

## 2012-10-09 MED ORDER — PENICILLIN G BENZATHINE 1200000 UNIT/2ML IM SUSP
1.2000 10*6.[IU] | Freq: Once | INTRAMUSCULAR | Status: AC
  Administered 2012-10-09: 1.2 10*6.[IU] via INTRAMUSCULAR
  Filled 2012-10-09: qty 2

## 2012-10-09 MED ORDER — ACETAMINOPHEN 325 MG PO TABS
650.0000 mg | ORAL_TABLET | Freq: Once | ORAL | Status: AC
  Administered 2012-10-09: 650 mg via ORAL
  Filled 2012-10-09: qty 2

## 2012-10-09 NOTE — ED Notes (Signed)
Pt c/o frontal HA x 3 days with cough and body aches; pt sts some sore throat; pt denies vision change

## 2012-10-09 NOTE — ED Notes (Signed)
Dr Freida Busman in room with pt at this time

## 2012-10-09 NOTE — ED Notes (Signed)
Patient transported to X-ray 

## 2012-10-09 NOTE — ED Provider Notes (Signed)
History     CSN: [id number]  Arrival date & time 10/09/12  4098   First MD Initiated Contact with Patient 10/09/12 0957      Chief Complaint  Patient presents with  . Headache  . Cough  . Generalized Body Aches    (Consider location/radiation/quality/duration/timing/severity/associated sxs/prior treatment) Patient is a 61 y.o. female presenting with headaches and cough. The history is provided by the patient.  Headache   Cough Associated symptoms include headaches.   patient here with cough headache and sore throat x3 days. No vomiting or diarrhea. Low-grade temp without chills. No rashes. Mild headache characterized as dull and without visual changes. No difficulty swallowing. Nothing makes her symptoms better and no medications used prior to arrival  Past Medical History  Diagnosis Date  . Hypertension   . Hyperlipidemia   . Allergy     seasonal allergies, ace inhibitors--angioedema    Past Surgical History  Procedure Date  . Ectopic pregnancy surgery 1970s  . Tubal ligation   . Right breast abscess drainage     Family History  Problem Relation Age of Onset  . Heart disease Mother 47  . Diabetes Mother   . Hypertension Sister   . Heart disease Maternal Grandmother     History  Substance Use Topics  . Smoking status: Never Smoker   . Smokeless tobacco: Not on file  . Alcohol Use: No    OB History    Grav Para Term Preterm Abortions TAB SAB Ect Mult Living                  Review of Systems  Respiratory: Positive for cough.   Neurological: Positive for headaches.  All other systems reviewed and are negative.    Allergies  Ace inhibitors  Home Medications   Current Outpatient Rx  Name  Route  Sig  Dispense  Refill  . AMLODIPINE BESYLATE 10 MG PO TABS   Oral   Take 1 tablet (10 mg total) by mouth daily.   30 tablet   5   . CETIRIZINE HCL 10 MG PO CHEW   Oral   Chew 1 tablet (10 mg total) by mouth daily.   30 tablet   5   . CO Q-10  100 MG PO CAPS   Oral   Take 1 tablet by mouth daily.          Marland Kitchen VITAMIN B-12 2500 MCG SL SUBL   Sublingual   Place 1 tablet under the tongue daily.          Marland Kitchen LOSARTAN POTASSIUM-HCTZ 100-25 MG PO TABS   Oral   Take 1 tablet by mouth daily.   30 tablet   5   . CENTRUM SILVER ADULT 50+ PO   Oral   Take by mouth.         Marland Kitchen FISH OIL 1000 MG PO CAPS   Oral   Take 2 capsules by mouth daily.          . OMEGA-3 KRILL OIL 300 MG PO CAPS   Oral   Take 1 capsule by mouth daily.            BP 157/79  Pulse 99  Temp 99 F (37.2 C) (Oral)  Resp 18  SpO2 96%  Physical Exam  Nursing note and vitals reviewed. Constitutional: She is oriented to person, place, and time. She appears well-developed and well-nourished.  Non-toxic appearance. No distress.  HENT:  Head: Normocephalic and atraumatic.  Mouth/Throat: Oropharyngeal exudate and posterior oropharyngeal erythema present.  Eyes: Conjunctivae normal, EOM and lids are normal. Pupils are equal, round, and reactive to light.  Neck: Normal range of motion. Neck supple. No tracheal deviation present. No mass present.  Cardiovascular: Normal rate, regular rhythm and normal heart sounds.  Exam reveals no gallop.   No murmur heard. Pulmonary/Chest: Effort normal. No stridor. No respiratory distress. She has decreased breath sounds. She has wheezes. She has no rhonchi. She has no rales.  Abdominal: Soft. Normal appearance and bowel sounds are normal. She exhibits no distension. There is no tenderness. There is no rebound and no CVA tenderness.  Musculoskeletal: Normal range of motion. She exhibits no edema and no tenderness.  Neurological: She is alert and oriented to person, place, and time. She has normal strength. No cranial nerve deficit or sensory deficit. GCS eye subscore is 4. GCS verbal subscore is 5. GCS motor subscore is 6.  Skin: Skin is warm and dry. No abrasion and no rash noted.  Psychiatric: She has a normal mood  and affect. Her speech is normal and behavior is normal.    ED Course  Procedures (including critical care time)   Labs Reviewed  URINALYSIS, ROUTINE W REFLEX MICROSCOPIC  URINE CULTURE   No results found.   No diagnosis found.    MDM  Pt given bicillin for strep throat and tylenol for fever--stable for discharge        Toy Baker, MD 10/09/12 1226

## 2012-10-10 LAB — URINE CULTURE

## 2012-12-17 ENCOUNTER — Ambulatory Visit: Payer: No Typology Code available for payment source

## 2012-12-31 ENCOUNTER — Encounter: Payer: Self-pay | Admitting: Internal Medicine

## 2012-12-31 ENCOUNTER — Ambulatory Visit (INDEPENDENT_AMBULATORY_CARE_PROVIDER_SITE_OTHER): Payer: No Typology Code available for payment source | Admitting: Internal Medicine

## 2012-12-31 VITALS — BP 138/84 | HR 64 | Temp 97.0°F | Ht 63.0 in | Wt 181.3 lb

## 2012-12-31 DIAGNOSIS — R42 Dizziness and giddiness: Secondary | ICD-10-CM

## 2012-12-31 DIAGNOSIS — Z0000 Encounter for general adult medical examination without abnormal findings: Secondary | ICD-10-CM

## 2012-12-31 DIAGNOSIS — I10 Essential (primary) hypertension: Secondary | ICD-10-CM

## 2012-12-31 DIAGNOSIS — I951 Orthostatic hypotension: Secondary | ICD-10-CM

## 2012-12-31 MED ORDER — TRIAMCINOLONE ACETONIDE 0.1 % EX CREA: TOPICAL_CREAM | Freq: Every day | CUTANEOUS | Status: DC | PRN

## 2012-12-31 MED ORDER — AMLODIPINE BESYLATE 10 MG PO TABS: 5.0000 mg | ORAL_TABLET | Freq: Every day | ORAL | Status: DC

## 2012-12-31 NOTE — Patient Instructions (Addendum)
PLEASE TAKE 1/2 TABLET OF YOUR HYZAAR AND AMLODIPINE DAILY  STAND UP SLOWLY AND WAIT ONCE STANDING FOR A SHORT TIME BEFORE WALKING  DRINK PLENTY OF WATER AND EAT REGULARLY  IF YOUR DIZZINESS GETS WORSE, CALL OR COME TO CLINIC [phone] OR IF SEVERE GO TO ED  YOU HAVE BEEN REFERRED FOR COLONOSCOPY THIS VISIT

## 2013-01-01 ENCOUNTER — Encounter: Payer: Self-pay | Admitting: Internal Medicine

## 2013-01-01 DIAGNOSIS — I951 Orthostatic hypotension: Secondary | ICD-10-CM | POA: Insufficient documentation

## 2013-01-01 NOTE — Progress Notes (Signed)
Subjective:   Patient ID: Eileen Cameron female   DOB: 1951-07-14 62 y.o.   MRN: [id number]  HPI: Eileen Cameron is a 62 y.o. very pleasant African American female past medical history of hypertension and hyperlipidemia presenting to clinic today for medication refills and complaints of dizziness.  She claims for the past 3 weeks she has been feeling light-headed at times and when she closes her eyes at times she feels like the room is spinning. She does endorse falling in the bath tub ~1 month ago and slightly hitting her head but denies any LOC.  She claims she occasionally has a sharp pain in her head radiating down to neck but spontaneously resolves.  She denies any LOC, N/V/D, chest pain, or shortness of breath.  She does claim to clear her throat at all "honking" and clearing her nose and occasionally notices small speckles of blood that spontaneously resolves.  She denies any overt nose bleed or pain.    Of note: orthostatic vital signs in clinic were positive: 156/84 with HR 64 lying and 138/84 with HR 64 standing.   Past Medical History  Diagnosis Date  . Hypertension   . Hyperlipidemia   . Allergy     seasonal allergies, ace inhibitors--angioedema   Current Outpatient Prescriptions  Medication Sig Dispense Refill  . amLODipine (NORVASC) 10 MG tablet Take 0.5 tablets (5 mg total) by mouth daily.  30 tablet  5  . Coenzyme Q10 (CO Q-10) 100 MG CAPS Take 1 tablet by mouth daily.       . Cyanocobalamin (VITAMIN B-12) 2500 MCG SUBL Place 1 tablet under the tongue daily.       Marland Kitchen losartan-hydrochlorothiazide (HYZAAR) 100-25 MG per tablet Take 0.5 tablets by mouth daily.      . Multiple Vitamins-Minerals (CENTRUM SILVER ADULT 50+ PO) Take by mouth.      . Omega-3 Fatty Acids (FISH OIL) 1000 MG CAPS Take 2 capsules by mouth daily.       . OMEGA-3 KRILL OIL 300 MG CAPS Take 1 capsule by mouth daily.       . cetirizine (ZYRTEC) 10 MG chewable tablet Chew 1 tablet (10 mg total) by mouth daily.   30 tablet  5  . triamcinolone cream (KENALOG) 0.1 % Apply topically daily as needed.  30 g  0   No current facility-administered medications for this visit.   Family History  Problem Relation Age of Onset  . Heart disease Mother 104  . Diabetes Mother   . Hypertension Sister   . Heart disease Maternal Grandmother    History   Social History  . Marital Status: Divorced    Spouse Name: N/A    Number of Children: N/A  . Years of Education: N/A   Social History Main Topics  . Smoking status: Never Smoker   . Smokeless tobacco: None  . Alcohol Use: No  . Drug Use: No  . Sexually Active: None   Other Topics Concern  . None   Social History Narrative  . None   Review of Systems: Constitutional: Denies fever, chills, diaphoresis, appetite change and fatigue.  HEENT: intermittent nasal congestion and sputum.  Denies photophobia, eye pain, redness, hearing loss, ear pain, congestion, sore throat, rhinorrhea, sneezing, mouth sores, trouble swallowing, neck pain, neck stiffness and tinnitus.   Respiratory: Denies SOB, DOE, cough, chest tightness,  and wheezing.   Cardiovascular: Denies chest pain, palpitations and leg swelling.  Gastrointestinal: Denies nausea, vomiting, abdominal pain, diarrhea, constipation, blood in  stool and abdominal distention.  Genitourinary: Denies dysuria, urgency, frequency, hematuria, flank pain and difficulty urinating.  Musculoskeletal: Denies myalgias, back pain, joint swelling, arthralgias and gait problem.  Skin: Denies pallor, rash and wound.  Neurological: Dizziness.  Denies seizures, syncope, weakness, light-headedness, numbness and headaches.  Hematological: Denies adenopathy. Easy bruising, personal or family bleeding history  Psychiatric/Behavioral: Denies suicidal ideation, mood changes, confusion, nervousness, sleep disturbance and agitation  Objective:  Physical Exam: Filed Vitals:   12/31/12 1435 12/31/12 1605 12/31/12 1607 12/31/12 1610   BP: 136/79 156/84 143/85 138/84  Pulse: 84 64 64 64  Temp: 97 F (36.1 C)     TempSrc: Oral     Height: 5\' 3"  (1.6 m)     Weight: 181 lb 4.8 oz (82.237 kg)     SpO2: 98%      Constitutional: Vital signs reviewed.  Patient is a well-developed and well-nourished female in no acute distress and cooperative with exam. Alert and oriented x3.  Head: Normocephalic and atraumatic Ear: TM normal bilaterally Mouth: no erythema or exudates, MMM Eyes: PERRL, EOMI, conjunctivae normal, No scleral icterus.  Neck: Supple, Trachea midline normal ROM Cardiovascular: RRR, S1 normal, S2 normal, no MRG, pulses symmetric and intact bilaterally Pulmonary/Chest: CTAB, no wheezes, rales, or rhonchi Abdominal: Soft. Non-tender, non-distended, bowel sounds are normal, no masses, organomegaly, or guarding present.  GU: no CVA tenderness Musculoskeletal: No joint deformities, erythema, or stiffness, ROM full and no nontender Hematology: no cervical, inginal, or axillary adenopathy.  Neurological: A&O x3, Strength is normal and symmetric bilaterally, cranial nerve II-XII are grossly intact, no focal motor deficit, sensory intact to light touch bilaterally. Gait stable.  Finger to nose testing intact.   Skin: Warm, dry and intact. No rash, cyanosis, or clubbing.  Psychiatric: Normal mood and affect. speech and behavior is normal. Judgment and thought content normal. Cognition and memory are normal.   Assessment & Plan:  Discussed with Dr. Criselda Peaches  -Diziness: orthostatic vital signs +.  will decrease amlodipine and continue her dose of hyzaar 50-12.5 and monitor for 2 weeks, if no improvement she will need to return and further work up for dizziness  -Referred for colonoscopy today

## 2013-01-01 NOTE — Assessment & Plan Note (Signed)
BP Readings from Last 3 Encounters:  12/31/12 138/84  10/09/12 157/79  08/26/12 136/86    Lab Results  Component Value Date   NA 140 07/30/2012   K 3.7 07/30/2012   CREATININE 0.80 07/30/2012    Assessment:  Blood pressure control: controlled  Progress toward BP goal:  at goal  Comments: takes half of her Hyzaar daily and takes full dose Norvasc  Plan:  Medications:  continue current medications: Hyzaar 50-12.5mg  daily and Norvasc 5mg  daily, instructed to cut both tablets in half for now.    Educational resources provided: brochure  Self management tools provided: home blood pressure logbook

## 2013-01-01 NOTE — Assessment & Plan Note (Signed)
Referred for colonoscopy

## 2013-01-01 NOTE — Assessment & Plan Note (Signed)
Complains of recent dizziness ~2-3 weeks, worse when lying down and eyes close.  +orthostatic vital signs in clinic today.  Recommended to take 1/2 dose of current BP meds.  ?vertigo although no nystagmus or tinnitus.  Does report bumping her head slightly on the side ~1 month prior when she slipped in the bath tub.    -take Hyzaar 50-12.5mg  daily -take norvasc 5mg  daily -monitor closely, return to clinic in 1-2 weeks for BP check and if symptoms still present or worsening will need to consider further work up for dizziness

## 2013-01-07 ENCOUNTER — Encounter (HOSPITAL_COMMUNITY): Payer: Self-pay | Admitting: Pharmacy Technician

## 2013-01-08 ENCOUNTER — Ambulatory Visit (HOSPITAL_COMMUNITY)
Admission: RE | Admit: 2013-01-08 | Discharge: 2013-01-08 | Disposition: A | Discharge status: Home / Self Care | Payer: No Typology Code available for payment source | Source: Ambulatory Visit | Attending: Gastroenterology | Admitting: Gastroenterology

## 2013-01-08 ENCOUNTER — Encounter (HOSPITAL_COMMUNITY)
Admission: RE | Disposition: A | Discharge status: Home / Self Care | Payer: Self-pay | Source: Ambulatory Visit | Attending: Gastroenterology

## 2013-01-08 ENCOUNTER — Encounter (HOSPITAL_COMMUNITY): Payer: Self-pay | Admitting: *Deleted

## 2013-01-08 DIAGNOSIS — Z1211 Encounter for screening for malignant neoplasm of colon: Secondary | ICD-10-CM | POA: Insufficient documentation

## 2013-01-08 DIAGNOSIS — K5730 Diverticulosis of large intestine without perforation or abscess without bleeding: Secondary | ICD-10-CM | POA: Insufficient documentation

## 2013-01-08 DIAGNOSIS — Z79899 Other long term (current) drug therapy: Secondary | ICD-10-CM | POA: Insufficient documentation

## 2013-01-08 DIAGNOSIS — I10 Essential (primary) hypertension: Secondary | ICD-10-CM | POA: Insufficient documentation

## 2013-01-08 HISTORY — PX: COLONOSCOPY: SHX5424

## 2013-01-08 SURGERY — COLONOSCOPY
Anesthesia: Moderate Sedation

## 2013-01-08 MED ORDER — FENTANYL CITRATE 0.05 MG/ML IJ SOLN
INTRAMUSCULAR | Status: AC
  Filled 2013-01-08: qty 4

## 2013-01-08 MED ORDER — MIDAZOLAM HCL 10 MG/2ML IJ SOLN
INTRAMUSCULAR | Status: AC
  Filled 2013-01-08: qty 4

## 2013-01-08 MED ORDER — MIDAZOLAM HCL 5 MG/5ML IJ SOLN
INTRAMUSCULAR | Status: DC | PRN
  Administered 2013-01-08: 1 mg via INTRAVENOUS
  Administered 2013-01-08 (×2): 2 mg via INTRAVENOUS
  Administered 2013-01-08: 1 mg via INTRAVENOUS

## 2013-01-08 MED ORDER — FENTANYL CITRATE 0.05 MG/ML IJ SOLN
INTRAMUSCULAR | Status: DC | PRN
  Administered 2013-01-08 (×3): 25 ug via INTRAVENOUS

## 2013-01-08 MED ORDER — DIPHENHYDRAMINE HCL 50 MG/ML IJ SOLN
INTRAMUSCULAR | Status: DC | PRN
  Administered 2013-01-08: 12.5 mg via INTRAVENOUS

## 2013-01-08 MED ORDER — DIPHENHYDRAMINE HCL 50 MG/ML IJ SOLN
INTRAMUSCULAR | Status: AC
  Filled 2013-01-08: qty 1

## 2013-01-08 MED ORDER — SODIUM CHLORIDE 0.9 % IV SOLN
INTRAVENOUS | Status: DC
  Administered 2013-01-08: 13:00:00 via INTRAVENOUS

## 2013-01-08 NOTE — Op Note (Signed)
[facility] [address] [place], [address]   COLONOSCOPY PROCEDURE REPORT  PATIENT: Eileen Cameron, Eileen Cameron  MR#: [id number] BIRTHDATE: 1951-05-17 , 61  yrs. old GENDER: Female ENDOSCOPIST: Willis Modena, MD REFERRED QQ:[id number] Gae Gallop, MD PROCEDURE DATE:  01/08/2013 PROCEDURE:   Colonoscopy, screening ASA CLASS:   Class II INDICATIONS:Average risk patient for colon cancer. MEDICATIONS: Benadryl 12.5 mg IV, Fentanyl 75 mcg IV, and Versed 6 mg IV  DESCRIPTION OF PROCEDURE:   After the risks benefits and alternatives of the procedure were thoroughly explained, informed consent was obtained.  A digital rectal exam revealed no abnormalities of the rectum.   The Pentax Ped Colon [id number] endoscope was introduced through the anus and advanced to the cecum, which was identified by both the appendix and ileocecal valve. No adverse events experienced.   The quality of the prep was good.  The instrument was then slowly withdrawn as the colon was fully examined.    Findings: Digital rectal exam was normal.  Prep quality was good.  Few scattered small sigmoid diverticula.  Otherwise normal colonoscopy; no polyps, masses, vascular ectasias, or inflammatory changes were seen. Normal retroflexed view of rectum.            Withdrawal time was about 10 minutes     .  The scope was withdrawn and the procedure completed.  ENDOSCOPIC IMPRESSION:     Few sigmoid diverticula, otherwise normal colonoscopy.  RECOMMENDATIONS:     1.  Watch for potential complications of procedure. 2.  Hemoccults annually x 3 years 5-9. 3.  Repeat screening colonoscopy in 10 years. 4.  Follow-up with [facility] on as-needed basis.  eSigned:  Willis Modena, MD 01/08/2013 2:01 PM   cc:

## 2013-01-08 NOTE — H&P (Signed)
Patient interval history reviewed.  Patient examined again.  There has been no change from documented H/P dated 01/03/13 (scanned into chart from our office) except as documented above.  Assessment:  1.  Average-risk colon cancer screening.  Plan:  1.  Colonoscopy. 2.  Risks (bleeding, infection, bowel perforation that could require surgery, sedation-related changes in cardiopulmonary systems), benefits (identification and possible treatment of source of symptoms, exclusion of certain causes of symptoms), and alternatives (watchful waiting, radiographic imaging studies, empiric medical treatment) of colonoscopy were explained to patient in detail and she wishes to proceed.

## 2013-01-09 ENCOUNTER — Encounter (HOSPITAL_COMMUNITY): Payer: Self-pay | Admitting: Gastroenterology

## 2013-01-14 ENCOUNTER — Ambulatory Visit (INDEPENDENT_AMBULATORY_CARE_PROVIDER_SITE_OTHER): Payer: No Typology Code available for payment source | Admitting: Internal Medicine

## 2013-01-14 ENCOUNTER — Ambulatory Visit: Payer: No Typology Code available for payment source | Admitting: Internal Medicine

## 2013-01-14 ENCOUNTER — Encounter: Payer: Self-pay | Admitting: Internal Medicine

## 2013-01-14 VITALS — BP 145/86 | HR 77 | Temp 96.9°F | Ht 63.0 in | Wt 181.0 lb

## 2013-01-14 DIAGNOSIS — J309 Allergic rhinitis, unspecified: Secondary | ICD-10-CM

## 2013-01-14 DIAGNOSIS — M899 Disorder of bone, unspecified: Secondary | ICD-10-CM

## 2013-01-14 DIAGNOSIS — Z0100 Encounter for examination of eyes and vision without abnormal findings: Secondary | ICD-10-CM

## 2013-01-14 DIAGNOSIS — I10 Essential (primary) hypertension: Secondary | ICD-10-CM

## 2013-01-14 DIAGNOSIS — J300 Vasomotor rhinitis: Secondary | ICD-10-CM

## 2013-01-14 DIAGNOSIS — Z0000 Encounter for general adult medical examination without abnormal findings: Secondary | ICD-10-CM

## 2013-01-14 DIAGNOSIS — I951 Orthostatic hypotension: Secondary | ICD-10-CM

## 2013-01-14 DIAGNOSIS — M949 Disorder of cartilage, unspecified: Secondary | ICD-10-CM

## 2013-01-14 DIAGNOSIS — M8580 Other specified disorders of bone density and structure, unspecified site: Secondary | ICD-10-CM

## 2013-01-14 MED ORDER — FLONASE 50 MCG/ACT NA SUSP: NASAL | Status: DC

## 2013-01-14 MED ORDER — CALCIUM CARBONATE-VITAMIN D 600-400 MG-UNIT PO TABS: 2.0000 | ORAL_TABLET | Freq: Every day | ORAL | Status: DC

## 2013-01-14 NOTE — Progress Notes (Signed)
Subjective:   Patient ID: Eileen Cameron female   DOB: 1951/08/01 62 y.o.   MRN: [id number]  HPI: [name] is a 62 y.o. pleasant African American female past medical history of hypertension and hyperlipidemia presenting to clinic today for follow up visit when noted to have orthostatic vital signs and complaints of light-headedness and dizzy.  Her BP medications were reduced in dose at that visit and today is noted NOT to be orthostatic with mild elevation in BP.  She reports slight improvement in symptoms.  Denies any syncope, near syncopal episodes, and loss of conciousness.  She does report occasional headaches, one sided that radiate to eye and relieved with NSAID.  She also endorses eye itchiness, occasional redness, tearing, and pain on the corners of her eyes when pressing.  She claims claritin has never work nor has zyrtec which made her feel funny.  She denies seasonal allergies and feels like these symptoms persist at anytime.  She continues to "honk" by clearing her throat when she has phlegm--occasionally green in color and sometimes blood streak when she does it a lot.    She is also requesting to see an eye specialist for vision screening and eye evaluation.  Otherwise, she has no major complaints at this time. She denies any fever, chills, N/V/D, chest pain, shortness of breath, abdominal pain, or any urinary complaints at this time.    Of note, she had her colonoscopy done by Dr. Dulce Sellar on 01/08/13--few sigmoid diverticuli, repeat 10 years.     Past Medical History  Diagnosis Date  . Hypertension   . Hyperlipidemia   . Allergy     seasonal allergies, ace inhibitors--angioedema   Current Outpatient Prescriptions  Medication Sig Dispense Refill  . amLODipine (NORVASC) 10 MG tablet Take 5 mg by mouth every morning.      . Coenzyme Q10 (CO Q-10) 100 MG CAPS Take 1 tablet by mouth daily.       . Cyanocobalamin (VITAMIN B-12) 2500 MCG SUBL Place 1 tablet under the tongue daily.        Marland Kitchen losartan-hydrochlorothiazide (HYZAAR) 100-25 MG per tablet Take 0.5 tablets by mouth every morning.       . Multiple Vitamins-Minerals (CENTRUM SILVER ADULT 50+ PO) Take by mouth.      . naproxen sodium (ANAPROX) 220 MG tablet Take 220-440 mg by mouth 2 (two) times daily as needed (for pain).      . Omega-3 Fatty Acids (FISH OIL) 1000 MG CAPS Take 2 capsules by mouth daily.       . OMEGA-3 KRILL OIL 300 MG CAPS Take 1 capsule by mouth daily.       . Simethicone 125 MG CAPS Take 125 mg by mouth daily as needed (for gas).      . sodium chloride (OCEAN) 0.65 % nasal spray Place 1 spray into the nose daily as needed for congestion.      . triamcinolone cream (KENALOG) 0.1 % Apply 1 application topically daily as needed (apply to rash).      [name] 50 MCG/ACT nasal spray 2 sprays per nostril one daily  16 g  2   No current facility-administered medications for this visit.   Family History  Problem Relation Age of Onset  . Heart disease Mother 75  . Diabetes Mother   . Hypertension Sister   . Heart disease Maternal Grandmother    History   Social History  . Marital Status: Divorced    Spouse Name: N/A  Number of Children: N/A  . Years of Education: N/A   Social History Main Topics  . Smoking status: Never Smoker   . Smokeless tobacco: None  . Alcohol Use: No  . Drug Use: No  . Sexually Active: No   Other Topics Concern  . None   Social History Narrative  . None   Review of Systems: Constitutional: Denies fever, chills, diaphoresis, appetite change and fatigue.  HEENT: occasional redness in b/l eyes, itching, pain on sides, congestion.  Denies photophobia, hearing loss, ear pain, congestion, sore throat,sneezing, mouth sores, trouble swallowing, neck pain, neck stiffness and tinnitus.   Respiratory: +phlegm.  Denies SOB, DOE, cough, chest tightness,  and wheezing.   Cardiovascular: Denies chest pain, palpitations and leg swelling.  Gastrointestinal: Denies nausea,  vomiting, abdominal pain, diarrhea, constipation, blood in stool and abdominal distention.  Genitourinary: Denies dysuria, urgency, frequency, hematuria, flank pain and difficulty urinating.  Musculoskeletal: Denies myalgias, back pain, joint swelling, arthralgias and gait problem.  Skin: Denies pallor, rash and wound.  Neurological: occasional light-headedness and headaches.  Denies dizziness, seizures, syncope, weakness,numbness. Hematological: Denies adenopathy. Easy bruising, personal or family bleeding history  Psychiatric/Behavioral: Denies suicidal ideation, mood changes, confusion, nervousness, sleep disturbance and agitation  Objective:  Physical Exam: Filed Vitals:   01/14/13 0852 01/14/13 0907 01/14/13 0909 01/14/13 0911  BP: 142/82 146/79 146/84 145/86  Pulse: 79 74 75 77  Temp: 96.9 F (36.1 C)     TempSrc: Oral     Height: 5\' 3"  (1.6 m)     Weight: 181 lb (82.101 kg)     SpO2: 99%      Constitutional: Vital signs reviewed.  Patient is a well-developed and well-nourished female in no acute distress and cooperative with exam. Alert and oriented x3.  Head: Normocephalic and atraumatic Ear: TM normal bilaterally Mouth: no erythema or exudates, MMM Eyes: PERRL, EOMI, conjunctivae normal, No scleral icterus.  Neck: Supple, Trachea midline normal ROM Cardiovascular: RRR, S1 normal, S2 normal, no MRG, pulses symmetric and intact bilaterally Pulmonary/Chest: CTAB, no wheezes, rales, or rhonchi Abdominal: Soft. Non-tender, non-distended, bowel sounds are normal, no masses, organomegaly, or guarding present.  GU: no CVA tenderness Musculoskeletal: No joint deformities, erythema, or stiffness, ROM full and no nontender Hematology: no cervical, inginal, or axillary adenopathy.  Neurological: A&O x3, Strength is normal and symmetric bilaterally, cranial nerve II-XII are grossly intact, no focal motor deficit, sensory intact to light touch bilaterally.  Skin: Warm, dry and intact. No  rash, cyanosis, or clubbing.  Psychiatric: Normal mood and affect. speech and behavior is normal. Judgment and thought content normal. Cognition and memory are normal.   Assessment & Plan:  Discussed with Dr. Josem Kaufmann  -trial of flonase for presumed vasomotor rhinitis -continue to monitor bp and symptoms of occasional dizziness with current bp medication regimen

## 2013-01-14 NOTE — Assessment & Plan Note (Signed)
Symptoms slightly improved since last visit.  Not orthosatic today in clinic.  Will proceed with current medication regimen and allow slight elevation in BP given reduced dose of medications.  Denies any head spinning, nausea, and vomiting at this time.  Occasionally had headaches and buzzing sound in b/l ears.    -continue to monitor, if symptoms worsen, instructed to call or come back to clinic -if syncope, or near syncope, or LOC, will need to go to ED asap  -if symptoms persist, will need to investigate further -instructed to continue to take her time when standing suddenly and avoiding rapid movements -will proceed with flonase trial for possible vasomotor rhinitis

## 2013-01-14 NOTE — Assessment & Plan Note (Signed)
Relates nasal congestion, headaches that radiate towards eyes sometimes, itchy eyes, tearing, but not seasonal and can be at any time.    -will try trial of flonase today

## 2013-01-14 NOTE — Patient Instructions (Addendum)
Please continue to take your blood pressure medicines as prescribed  Please check your blood pressure daily and record your values, call me in one week with your values.  If severely elevated >150/90, call me and let me know, we may need to change your medicine  If you notice your dizziness or symptoms getting worse or not improving call clinic [phone]  If any fainting, go directly to ED  Try flonase nasal spray to help with allergic symptoms, its [facility]  Take some time before standing, drink plenty of water, eat well, and take your time in the shower   Follow up with eye doctor  It was great seeing you  Allergic Rhinitis Allergic rhinitis is when the mucous membranes in the nose respond to allergens. Allergens are particles in the air that cause your body to have an allergic reaction. This causes you to release allergic antibodies. Through a chain of events, these eventually cause you to release histamine into the blood stream (hence the use of antihistamines). Although meant to be protective to the body, it is this release that causes your discomfort, such as frequent sneezing, congestion and an itchy runny nose.  CAUSES  The pollen allergens may come from grasses, trees, and weeds. This is seasonal allergic rhinitis, or "hay fever." Other allergens cause year-round allergic rhinitis (perennial allergic rhinitis) such as house dust mite allergen, pet dander and mold spores.  SYMPTOMS   Nasal stuffiness (congestion).  Runny, itchy nose with sneezing and tearing of the eyes.  There is often an itching of the mouth, eyes and ears. It cannot be cured, but it can be controlled with medications. DIAGNOSIS  If you are unable to determine the offending allergen, skin or blood testing may find it. TREATMENT   Avoid the allergen.  Medications and allergy shots (immunotherapy) can help.  Hay fever may often be treated with antihistamines in pill or nasal spray forms.  Antihistamines block the effects of histamine. There are over-the-counter medicines that may help with nasal congestion and swelling around the eyes. Check with your caregiver before taking or giving this medicine. If the treatment above does not work, there are many new medications your caregiver can prescribe. Stronger medications may be used if initial measures are ineffective. Desensitizing injections can be used if medications and avoidance fails. Desensitization is when a patient is given ongoing shots until the body becomes less sensitive to the allergen. Make sure you follow up with your caregiver if problems continue. SEEK MEDICAL CARE IF:   You develop fever (more than 100.5 F (38.1 C).  You develop a cough that does not stop easily (persistent).  You have shortness of breath.  You start wheezing.  Symptoms interfere with normal daily activities. Document Released: 07/18/2001 Document Revised: 01/15/2012 Document Reviewed: 01/27/2009 [facility] Patient Information 2013 [place], [place].

## 2013-01-14 NOTE — Assessment & Plan Note (Signed)
Discussed dexa scans again on the phone today.  She has not been taking the calcium and vitamin dr. Manson Cameron recommended in 08/2012 but claims she does take vitamin d 5000iu daily and has some calcium vitamin d combination at home with unknown dose to her at this time.    -resume calcium-vitamin d 1200-800mg  daily, asked to take current bottles to pharmacy and discuss with pharmacists tablets needed to take to equal current prescription so she does not need to buy more if possible -if any side effects asked to call clinic -consider repeat DEXA when willing

## 2013-01-14 NOTE — Assessment & Plan Note (Addendum)
Cannot afford zostavax vaccine, otherwise up to date.   she had her colonoscopy done by Dr. Dulce Sellar on 01/08/13--few sigmoid diverticuli, repeat 10 years.

## 2013-01-14 NOTE — Assessment & Plan Note (Addendum)
BP today slightly elevated in setting of reducing dose of norvasc last visit to 5mg  for orthostatic hypotension.   BP Readings from Last 3 Encounters:  01/14/13 145/86  01/08/13 153/82  01/08/13 153/82   Lab Results  Component Value Date   NA 140 07/30/2012   K 3.7 07/30/2012   CREATININE 0.80 07/30/2012    Assessment:  Blood pressure control: mildly elevated  Progress toward BP goal:  at goal  Comments: decreased norvasc last visit due to orthostatic vital signs.  NOT orthostatic today.    Plan:  Medications:  continue current medications norvasc 5mg  qd and hyzaar 50-12.5mg  daily  Educational resources provided: video  Self management tools provided: home blood pressure logbook;instructions for home blood pressure monitoring  Other plans: monitor BP at home, instructed to call clinic with values after one week or if she notices drastic elevation in BP >150/90 to call clinic and we may need to make adjustments

## 2013-01-23 ENCOUNTER — Other Ambulatory Visit: Payer: Self-pay | Admitting: Internal Medicine

## 2013-01-23 DIAGNOSIS — I10 Essential (primary) hypertension: Secondary | ICD-10-CM

## 2013-01-23 MED ORDER — LOSARTAN POTASSIUM-HCTZ 50-12.5 MG PO TABS
1.0000 | ORAL_TABLET | Freq: Every day | ORAL | Status: DC
Start: 2013-01-23 — End: 2013-04-08

## 2013-01-23 NOTE — Telephone Encounter (Signed)
Changed it to hyzaar 50-12.5 which is what she should be taking.

## 2013-02-24 ENCOUNTER — Encounter: Payer: Self-pay | Admitting: *Deleted

## 2013-02-24 ENCOUNTER — Telehealth: Payer: Self-pay | Admitting: *Deleted

## 2013-02-24 NOTE — Telephone Encounter (Signed)
Pt left a message unable to afford meds since [facility] closed. Message left on ID recording to start back with [facility] and poss [facility]. If no assist call [facility] - may have to change med. Stanton Kidney Gaylon Bentz RN 02/24/13 2:15PM

## 2013-04-01 ENCOUNTER — Telehealth: Payer: Self-pay | Admitting: Internal Medicine

## 2013-04-01 NOTE — Telephone Encounter (Signed)
I called to speak to Eileen Cameron on the phone today in regards to paperwork I received from [facility] in regards to some medication changes and her blood pressure.  Per [facility] documentation, she is asking for flonase, however, they can get Nasonex for free as an alternative.  Additionally, they gave a list of free medications for BP that are available and noted that her most recent BP reading was 158/98 with continued occasional dizziness.    She has an appointment to be seen by me on 04/15/13, however, she will likely need to be seen earlier given her BP and dizziness complaints if she wishes.    I called the house number where there was no answer and then I called her cell phone and left a voicemail for her to call the clinic back to speak to myself or schedule an earlier appointment to be seen in [facility].   Upon speaking to her or seeing her in opc we can decide if she still needs nasal spray and if so we can change to nasonex at that time.

## 2013-04-08 ENCOUNTER — Encounter: Payer: No Typology Code available for payment source | Admitting: Internal Medicine

## 2013-04-08 ENCOUNTER — Encounter: Payer: Self-pay | Admitting: Internal Medicine

## 2013-04-08 ENCOUNTER — Ambulatory Visit (INDEPENDENT_AMBULATORY_CARE_PROVIDER_SITE_OTHER): Payer: No Typology Code available for payment source | Admitting: Internal Medicine

## 2013-04-08 VITALS — BP 142/80 | HR 74 | Temp 97.2°F | Ht 63.0 in | Wt 183.7 lb

## 2013-04-08 DIAGNOSIS — R232 Flushing: Secondary | ICD-10-CM

## 2013-04-08 DIAGNOSIS — M25562 Pain in left knee: Secondary | ICD-10-CM | POA: Insufficient documentation

## 2013-04-08 DIAGNOSIS — N951 Menopausal and female climacteric states: Secondary | ICD-10-CM

## 2013-04-08 DIAGNOSIS — I10 Essential (primary) hypertension: Secondary | ICD-10-CM

## 2013-04-08 DIAGNOSIS — M25569 Pain in unspecified knee: Secondary | ICD-10-CM

## 2013-04-08 MED ORDER — OLMESARTAN MEDOXOMIL-HCTZ 20-12.5 MG PO TABS: 1.0000 | ORAL_TABLET | Freq: Every day | ORAL | Status: DC

## 2013-04-08 MED ORDER — MOMETASONE FUROATE 50 MCG/ACT NA SUSP: 2.0000 | Freq: Every day | NASAL | Status: DC

## 2013-04-08 NOTE — Progress Notes (Signed)
Case discussed with Dr. Heloise Beecham  Immediately after the resident saw the patient.  We reviewed the resident's history and exam and pertinent patient test results.  I agree with the assessment, diagnosis and plan of care documented in the resident's note.  Black Cohash should also be avoided in women with high risk for breast cancer, though the evidence around this recommendation is by expert opinion.

## 2013-04-08 NOTE — Progress Notes (Signed)
Patient ID: Eileen Cameron, female   DOB: 18-Feb-1951, 62 y.o.   MRN: [id number]  Subjective:   Patient ID: Eileen Cameron female   DOB: [birth year] 62 y.o.   MRN: [id number]  HPI: [name] is a 62 y.o. female with history of HTN presenting to the clinic for follow up of BP. HTN: Patient is here for followup of hypertension. She had some dizziness associated with amlodipine 10 mg and reports that dizziness has resolved since her PCP decrease the dose to 5 mg daily. She is also on losartan-HCTZ, however this medication will no longer be affordable to the medication assistance program. Her blood pressure today is 142/80. L knee pain:  patient reports about a week's worth of pain in her Left knee. The pain is intermittent, and often worse toward the end of the day. she denies any joint swelling or redness. She denies any numbness of her lower leg. She has tried Aleve and ice application, which helps the pain. She denies any antecedent trauma. She has chronic back pain with disc bulge at L5-S1 seen on MRI in 2005.   Past Medical History  Diagnosis Date  . Hypertension   . Hyperlipidemia   . Allergy     seasonal allergies, ace inhibitors--angioedema   Current Outpatient Prescriptions  Medication Sig Dispense Refill  . amLODipine (NORVASC) 10 MG tablet Take 5 mg by mouth every morning.      . Calcium Carbonate-Vitamin D 600-400 MG-UNIT per tablet Take 2 tablets by mouth daily.  60 tablet  5  . Coenzyme Q10 (CO Q-10) 100 MG CAPS Take 1 tablet by mouth daily.       . Cyanocobalamin (VITAMIN B-12) 2500 MCG SUBL Place 1 tablet under the tongue daily.       . mometasone (NASONEX) 50 MCG/ACT nasal spray Place 2 sprays into the nose daily.  17 g  2  . Multiple Vitamins-Minerals (CENTRUM SILVER ADULT 50+ PO) Take by mouth.      . naproxen sodium (ANAPROX) 220 MG tablet Take 220-440 mg by mouth 2 (two) times daily as needed (for pain).      Marland Kitchen olmesartan-hydrochlorothiazide (BENICAR HCT) 20-12.5 MG per  tablet Take 1 tablet by mouth daily.  30 tablet  5  . Omega-3 Fatty Acids (FISH OIL) 1000 MG CAPS Take 2 capsules by mouth daily.       . OMEGA-3 KRILL OIL 300 MG CAPS Take 1 capsule by mouth daily.       . Simethicone 125 MG CAPS Take 125 mg by mouth daily as needed (for gas).      . sodium chloride (OCEAN) 0.65 % nasal spray Place 1 spray into the nose daily as needed for congestion.      . triamcinolone cream (KENALOG) 0.1 % Apply 1 application topically daily as needed (apply to rash).       No current facility-administered medications for this visit.   Family History  Problem Relation Age of Onset  . Heart disease Mother 20  . Diabetes Mother   . Hypertension Sister   . Heart disease Maternal Grandmother    History   Social History  . Marital Status: Divorced    Spouse Name: N/A    Number of Children: N/A  . Years of Education: N/A   Social History Main Topics  . Smoking status: Never Smoker   . Smokeless tobacco: None  . Alcohol Use: No  . Drug Use: No  . Sexually Active: No   Other Topics  Concern  . None   Social History Narrative  . None   Review of Systems: 10 pt ROS performed, pertinent positives and negatives noted in HPI Objective:  Physical Exam: Filed Vitals:   04/08/13 0813  BP: 142/80  Pulse: 74  Temp: 97.2 F (36.2 C)  TempSrc: Oral  Height: 5\' 3"  (1.6 m)  Weight: 183 lb 11.2 oz (83.326 kg)  SpO2: 99%   Constitutional: Vital signs reviewed.  Patient is a well-developed and well-nourished woman in no acute distress and cooperative with exam. Alert and oriented x3.  Head: Normocephalic and atraumatic Mouth: no erythema or exudates, MMM Eyes: PERRL, EOMI, conjunctivae normal, No scleral icterus.  Cardiovascular: RRR, S1 normal, S2 normal, no MRG, pulses symmetric and intact bilaterally Pulmonary/Chest: CTAB, no wheezes, rales, or rhonchi Abdominal: Soft. Non-tender, non-distended, bowel sounds are normal, no masses, organomegaly, or guarding  present.  Musculoskeletal: examination of bilateral knee joints reveals no joint effusion or erythema. No cyst palpated in the popliteal fossa. Range of motion of bilateral knee joints is full strength is 5 out of 5. No joint laxity. Negative straight leg raise bilaterally and McMurray maneuvers. 2+ patellar reflexes. Soft tissues pulses are 2+ bilaterally. Neurological: A&O x3, Strength is normal and symmetric bilaterally, cranial nerve II-XII are grossly intact, no focal motor deficit, sensory intact to light touch bilaterally.  Skin: Warm, dry and intact. No rash, cyanosis, or clubbing.  Psychiatric: Normal mood and affect. Assessment & Plan:   Please see problem-based charting for assessment and plan.

## 2013-04-08 NOTE — Assessment & Plan Note (Signed)
Patient wants to know if ok to take black cohosh for hot flash symptoms. Told her that there is a small chance this medication may raise her blood pressure, but should be okay to trial taking it. Instructed her to check her blood pressure on days when she takes the medication. If she notices a pattern of elevated blood pressures while on this supplement, she should discontinue it. She will return in one month for followup of blood pressure.

## 2013-04-08 NOTE — Assessment & Plan Note (Signed)
Lab Results  Component Value Date   NA 140 07/30/2012   K 3.7 07/30/2012   CL 103 07/30/2012   CO2 27 07/30/2012   BUN 16 07/30/2012   CREATININE 0.80 07/30/2012   CREATININE 0.82 11/24/2010    BP Readings from Last 3 Encounters:  04/08/13 142/80  01/14/13 145/86  01/08/13 153/82    Assessment: Hypertension control:  controlled  Progress toward goals:  at goal Barriers to meeting goals:  no barriers identified  Plan: Hypertension treatment:  Continued amlodipine 5 mg daily. Substitute Benicar/HCTZ for losartan/HCTZ due to formulary changes in medication assistance program.   Patient wants to know if ok to take black cohosh for hot flash symptoms. Told her that there is a small chance this medication may raise her blood pressure, but should be okay to trial taking it. Instructed her to check her blood pressure on days when she takes the medication. If she notices a pattern of elevated blood pressures while on this supplement, she should discontinue it. She will return in one month for followup of blood pressure.

## 2013-04-08 NOTE — Patient Instructions (Signed)
1. I have faxed new prescriptions for your blood pressure medication to your pharmacy. Keep taking amlodipine 5mg  daily. Add benicar-HCTZ one pill daily. I have also sent in the new nasal spray. 2. Keep using ice or heat for your knee. It may represent osteoarthritis. Follow up with Dr. Virgina Organ on July 8th or call earlier if needed. Keep an eye out for these symptoms: knee locking, popping, red knee, fever, or chills.  Try taking ibuprofen 600-800 mg three times a day ( no more than 3 times a day) for the next 5 days for the pain. Try elevating the joint, and using ice packs.   Osteoarthritis Osteoarthritis is the most common form of arthritis. It is redness, soreness, and swelling (inflammation) affecting the cartilage. Cartilage acts as a cushion, covering the ends of bones where they meet to form a joint. CAUSES  Over time, the cartilage begins to wear away. This causes bone to rub on bone. This produces pain and stiffness in the affected joints. Factors that contribute to this problem are:  Excessive body weight.  Age.  Overuse of joints. SYMPTOMS   People with osteoarthritis usually experience joint pain, swelling, or stiffness.  Over time, the joint may lose its normal shape.  Small deposits of bone (osteophytes) may grow on the edges of the joint.  Bits of bone or cartilage can break off and float inside the joint space. This may cause more pain and damage.  Osteoarthritis can lead to depression, anxiety, feelings of helplessness, and limitations on daily activities. The most commonly affected joints are in the:  Ends of the fingers.  Thumbs.  Neck.  Lower back.  Knees.  Hips. DIAGNOSIS  Diagnosis is mostly based on your symptoms and exam. Tests may be helpful, including:  X-rays of the affected joint.  A computerized magnetic scan (MRI).  Blood tests to rule out other types of arthritis.  Joint fluid tests. This involves using a needle to draw fluid from the  joint and examining the fluid under a microscope. TREATMENT  Goals of treatment are to control pain, improve joint function, maintain a normal body weight, and maintain a healthy lifestyle. Treatment approaches may include:  A prescribed exercise program with rest and joint relief.  Weight control with nutritional education.  Pain relief techniques such as:  Properly applied heat and cold.  Electric pulses delivered to nerve endings under the skin (transcutaneous electrical nerve stimulation, TENS).  Massage.  Certain supplements. Ask your caregiver before using any supplements, especially in combination with prescribed drugs.  Medicines to control pain, such as:  Acetaminophen.  Nonsteroidal anti-inflammatory drugs (NSAIDs), such as naproxen.  Narcotic or central-acting agents, such as tramadol. This drug carries a risk of addiction and is generally prescribed for short-term use.  Corticosteroids. These can be given orally or as injection. This is a short-term treatment, not recommended for routine use.  Surgery to reposition the bones and relieve pain (osteotomy) or to remove loose pieces of bone and cartilage. Joint replacement may be needed in advanced states of osteoarthritis. HOME CARE INSTRUCTIONS  Your caregiver can recommend specific types of exercise. These may include:  Strengthening exercises. These are done to strengthen the muscles that support joints affected by arthritis. They can be performed with weights or with exercise bands to add resistance.  Aerobic activities. These are exercises, such as brisk walking or low-impact aerobics, that get your heart pumping. They can help keep your lungs and circulatory system in shape.  Range-of-motion activities. These  keep your joints limber.  Balance and agility exercises. These help you maintain daily living skills. Learning about your condition and being actively involved in your care will help improve the course of  your osteoarthritis. SEEK MEDICAL CARE IF:   You feel hot or your skin turns red.  You develop a rash in addition to your joint pain.  You have an oral temperature above 102 F (38.9 C). FOR MORE INFORMATION  National Institute of Arthritis and Musculoskeletal and Skin Diseases: www.niams.http://www.myers.net/ [employer] on Aging: https://walker.com/ American College of Rheumatology: www.rheumatology.org Document Released: 10/23/2005 Document Revised: 01/15/2012 Document Reviewed: 02/03/2010 [facility] Patient Information 2014 [place], [place].

## 2013-04-08 NOTE — Assessment & Plan Note (Signed)
Possibly early osteoarthritis with description of worsening pain in joint toward the end of the dayand with heavy use. No clinical evidence to suggest an infectious or inflammatory arthritis. Negative lateral straight leg raise, and no neurologic deficits. No alarm systems or indication for x-ray of the knee today. Instructed on conservative therapy with rest, ice, elevation, and NSAIDs with ibuprofen 600 800 mg up to 3 times a day. Discussed alarm systems including joint locking/popping, acute swelling of the joint, or warmth/erythema of the joint. She should return to the clinic if she experiences any of these. -She will come back in one month for followup

## 2013-04-15 ENCOUNTER — Encounter: Payer: No Typology Code available for payment source | Admitting: Internal Medicine

## 2013-05-13 ENCOUNTER — Ambulatory Visit (INDEPENDENT_AMBULATORY_CARE_PROVIDER_SITE_OTHER): Payer: No Typology Code available for payment source | Admitting: Internal Medicine

## 2013-05-13 ENCOUNTER — Encounter: Payer: Self-pay | Admitting: Internal Medicine

## 2013-05-13 VITALS — BP 143/80 | HR 79 | Temp 97.0°F | Ht 63.0 in | Wt 186.4 lb

## 2013-05-13 DIAGNOSIS — G479 Sleep disorder, unspecified: Secondary | ICD-10-CM | POA: Insufficient documentation

## 2013-05-13 DIAGNOSIS — M25562 Pain in left knee: Secondary | ICD-10-CM

## 2013-05-13 DIAGNOSIS — M8580 Other specified disorders of bone density and structure, unspecified site: Secondary | ICD-10-CM

## 2013-05-13 DIAGNOSIS — R5383 Other fatigue: Secondary | ICD-10-CM

## 2013-05-13 DIAGNOSIS — R5381 Other malaise: Secondary | ICD-10-CM

## 2013-05-13 DIAGNOSIS — M25569 Pain in unspecified knee: Secondary | ICD-10-CM

## 2013-05-13 DIAGNOSIS — I10 Essential (primary) hypertension: Secondary | ICD-10-CM

## 2013-05-13 DIAGNOSIS — I951 Orthostatic hypotension: Secondary | ICD-10-CM

## 2013-05-13 DIAGNOSIS — M949 Disorder of cartilage, unspecified: Secondary | ICD-10-CM

## 2013-05-13 DIAGNOSIS — E785 Hyperlipidemia, unspecified: Secondary | ICD-10-CM

## 2013-05-13 DIAGNOSIS — M899 Disorder of bone, unspecified: Secondary | ICD-10-CM

## 2013-05-13 NOTE — Assessment & Plan Note (Addendum)
Resolved  Since last visit  F/u dexa scan Calcium and vitamin d

## 2013-05-13 NOTE — Assessment & Plan Note (Signed)
Repeat dexa scan Has not started calcium yet but says she does take vitamin d but not sure of how many units  -advised to restart calcium at least 1200mg  daily and 400-800iu vitamin d -f/u dexa

## 2013-05-13 NOTE — Assessment & Plan Note (Signed)
Gradual onset, claims to have very little energy and easily tired.  Also reports feeling cold often and then has occasional hot flashes.  Last TSH wnl 0.828 2013  -Recheck tsh and ft4 -cbc, cmet

## 2013-05-13 NOTE — Progress Notes (Signed)
Subjective:   Patient ID: Madie Cahn female   DOB: May 01, 1951 62 y.o.   MRN: [id number]  HPI: Ms.Deanna Depp is a 62 y.o. African American female with PMH of hypertension and hyperlipidemia presenting to clinic today for routine follow up visit.  She was last seen by  Dr. Heloise Beecham in [facility] on 04/08/13 for HTN and knee pain.  Since her last visit, her BP has remained stable 143/80 today.  She has been unable to start the benicar/hctz since it is not available at [facility] at this time, so she is continuing her Hyzaar until [facility] calls her along with her amlodipine.  She denies any more dizziness.    Her knee pain has improved since her last visit.  Of note, she was noted to have T score of -1.5 for lumbar in 2003 dexa scan and is due for another.  She has been prescribed calcium and vitamin d but has been unable to get it from her pharmacy.  Today, she endorses feeling tired all the time and cold at times along with occasional hot flashes. She denies any fever chills, chest pain, N/V/D, abdominal pain, or any urinary complaints at this time.   Past Medical History  Diagnosis Date  . Hypertension   . Hyperlipidemia   . Allergy     seasonal allergies, ace inhibitors--angioedema   Current Outpatient Prescriptions  Medication Sig Dispense Refill  . amLODipine (NORVASC) 10 MG tablet Take 5 mg by mouth every morning.      . Calcium Carbonate-Vitamin D 600-400 MG-UNIT per tablet Take 2 tablets by mouth daily.  60 tablet  5  . Coenzyme Q10 (CO Q-10) 100 MG CAPS Take 1 tablet by mouth daily.       . Cyanocobalamin (VITAMIN B-12) 2500 MCG SUBL Place 1 tablet under the tongue daily.       . mometasone (NASONEX) 50 MCG/ACT nasal spray Place 2 sprays into the nose daily.  17 g  2  . Multiple Vitamins-Minerals (CENTRUM SILVER ADULT 50+ PO) Take by mouth.      . naproxen sodium (ANAPROX) 220 MG tablet Take 220-440 mg by mouth 2 (two) times daily as needed (for pain).      . Nutritional Supplements (ESTROVEN PO)  Take by mouth.      . olmesartan-hydrochlorothiazide (BENICAR HCT) 20-12.5 MG per tablet Take 1 tablet by mouth daily.  30 tablet  5  . Omega-3 Fatty Acids (FISH OIL) 1000 MG CAPS Take 2 capsules by mouth daily.       . OMEGA-3 KRILL OIL 300 MG CAPS Take 1 capsule by mouth daily.       . Simethicone 125 MG CAPS Take 125 mg by mouth daily as needed (for gas).      . sodium chloride (OCEAN) 0.65 % nasal spray Place 1 spray into the nose daily as needed for congestion.      . triamcinolone cream (KENALOG) 0.1 % Apply 1 application topically daily as needed (apply to rash).       No current facility-administered medications for this visit.   Family History  Problem Relation Age of Onset  . Heart disease Mother 66  . Diabetes Mother   . Hypertension Sister   . Heart disease Maternal Grandmother    History   Social History  . Marital Status: Divorced    Spouse Name: N/A    Number of Children: N/A  . Years of Education: N/A   Social History Main Topics  . Smoking  status: Never Smoker   . Smokeless tobacco: None  . Alcohol Use: No  . Drug Use: No  . Sexually Active: No   Other Topics Concern  . None   Social History Narrative  . None   Review of Systems:  Constitutional:  Feeling cold, fatigue, occasional hot flashes.  Denies fever, diaphoresis, appetite change   HEENT:  Denies congestion, sore throat, rhinorrhea, sneezing, mouth sores, trouble swallowing, neck pain   Respiratory:  Denies SOB, DOE, cough, and wheezing.   Cardiovascular:  Denies palpitations and leg swelling.   Gastrointestinal:  Denies nausea, vomiting, abdominal pain, diarrhea, constipation, blood in stool and abdominal distention.   Genitourinary:  Denies dysuria, urgency, frequency, hematuria, flank pain and difficulty urinating.   Musculoskeletal:  Denies myalgias, back pain, joint swelling, arthralgias and gait problem.   Skin:  Denies pallor, rash and wound.   Neurological:  Denies dizziness, seizures,  syncope, weakness, light-headedness, numbness and headaches.    Objective:  Physical Exam: Filed Vitals:   05/13/13 1410  BP: 143/80  Pulse: 79  Temp: 97 F (36.1 C)  TempSrc: Oral  Height: 5\' 3"  (1.6 m)  Weight: 186 lb 6.4 oz (84.55 kg)  SpO2: 98%   Vitals reviewed. General: sitting in chair, NAD HEENT: PERRL, EOMI, no scleral icterus Cardiac: RRR, no rubs, murmurs or gallops Pulm: clear to auscultation bilaterally, no wheezes, rales, or rhonchi Abd: soft, nontender, nondistended, BS present Ext: warm and well perfused, no pedal edema, +2DP B/L Neuro: alert and oriented X3, cranial nerves II-XII grossly intact, strength and sensation to light touch equal in bilateral upper and lower extremities  Assessment & Plan:  Discussed with Dr. Rogelia Boga  Repeat dexa scan, restart calcium vitamin d Resume benicar/hctz when available at [facility] Check TSH and ft4

## 2013-05-13 NOTE — Assessment & Plan Note (Signed)
BP Readings from Last 3 Encounters:  05/13/13 143/80  04/08/13 142/80  01/14/13 145/86   Lab Results  Component Value Date   NA 140 07/30/2012   K 3.7 07/30/2012   CREATININE 0.80 07/30/2012   Assessment: Blood pressure control: controlled Progress toward BP goal:  unchanged Comments: has not started benicar/hctz yet due to not being available at [facility].  It is now available for pick up and she will start likely next week when she is due for her amlodipine refill as well.   Plan: Medications:  continue current medications amlodipine 5mg  and losartan/hctz but transition to benicar/hctz when available Educational resources provided: brochure Self management tools provided: home blood pressure logbook

## 2013-05-13 NOTE — Assessment & Plan Note (Signed)
Resolved since decreasing amlodipine to 5mg  qd

## 2013-05-13 NOTE — Patient Instructions (Signed)
General Instructions:  Please restart your calcium and vitamin d tablets Please get your dexa scan done Please start your new BP medication when available at [facility]  Treatment Goals:  Goals (1 Years of Data) as of 05/13/13         As of Today 04/08/13 01/14/13 01/14/13 01/14/13     Blood Pressure    . Blood Pressure < 140/90  143/80 142/80 145/86 146/84 146/79      Progress Toward Treatment Goals:  Treatment Goal 05/13/2013  Blood pressure unchanged    Self Care Goals & Plans:  Self Care Goal 05/13/2013  Manage my medications take my medicines as prescribed; bring my medications to every visit  Monitor my health -  Eat healthy foods -  Be physically active -    Care Management & Community Referrals:  Referral 01/14/2013  Referrals made for care management support none needed

## 2013-05-14 LAB — LIPID PANEL
Cholesterol: 187 mg/dL (ref 0–200)
HDL: 51 mg/dL (ref >39)
Total CHOL/HDL Ratio: 3.7 Ratio

## 2013-05-14 LAB — CBC WITH DIFFERENTIAL/PLATELET
Eosinophils Relative: 1 % (ref 0–5)
HCT: 39.8 % (ref 36.0–46.0)
Lymphocytes Relative: 52 % — ABNORMAL HIGH (ref 12–46)
Lymphs Abs: 2.3 10*3/uL (ref 0.7–4.0)
MCH: 29.8 pg (ref 26.0–34.0)
MCHC: 34.4 g/dL (ref 30.0–36.0)
MCV: 86.5 fL (ref 78.0–100.0)
Monocytes Absolute: 0.4 10*3/uL (ref 0.1–1.0)
Monocytes Relative: 9 % (ref 3–12)
Platelets: 283 10*3/uL (ref 150–400)
RBC: 4.6 MIL/uL (ref 3.87–5.11)
RDW: 14.4 % (ref 11.5–15.5)
WBC: 4.5 10*3/uL (ref 4.0–10.5)

## 2013-05-14 LAB — COMPREHENSIVE METABOLIC PANEL
BUN: 14 mg/dL (ref 6–23)
CO2: 30 mEq/L (ref 19–32)
Calcium: 9.9 mg/dL (ref 8.4–10.5)
Chloride: 104 mEq/L (ref 96–112)
Creat: 0.89 mg/dL (ref 0.50–1.10)
Glucose, Bld: 86 mg/dL (ref 70–99)
Sodium: 141 mEq/L (ref 135–145)

## 2013-05-14 NOTE — Progress Notes (Signed)
Case discussed with Dr. Qureshi at the time of the visit.  We reviewed the resident's history and exam and pertinent patient test results.  I agree with the assessment, diagnosis, and plan of care documented in the resident's note. 

## 2013-05-21 ENCOUNTER — Ambulatory Visit (HOSPITAL_COMMUNITY)
Admission: RE | Admit: 2013-05-21 | Discharge: 2013-05-21 | Disposition: A | Discharge status: Home / Self Care | Payer: No Typology Code available for payment source | Source: Ambulatory Visit | Attending: Internal Medicine | Admitting: Internal Medicine

## 2013-05-21 DIAGNOSIS — Z780 Asymptomatic menopausal state: Secondary | ICD-10-CM | POA: Insufficient documentation

## 2013-05-21 DIAGNOSIS — Z13820 Encounter for screening for osteoporosis: Secondary | ICD-10-CM | POA: Insufficient documentation

## 2013-05-21 DIAGNOSIS — M8580 Other specified disorders of bone density and structure, unspecified site: Secondary | ICD-10-CM

## 2013-06-17 ENCOUNTER — Ambulatory Visit: Payer: No Typology Code available for payment source

## 2013-06-25 ENCOUNTER — Other Ambulatory Visit: Payer: Self-pay | Admitting: *Deleted

## 2013-06-25 MED ORDER — AMLODIPINE BESYLATE 5 MG PO TABS: 5.0000 mg | ORAL_TABLET | Freq: Every day | ORAL | Status: DC

## 2013-06-25 NOTE — Telephone Encounter (Signed)
Please call in to health department, if 5mg  not available, then she should get the 10mg  tablet and take 1/2 daily for total 5mg  daily.

## 2013-06-25 NOTE — Telephone Encounter (Signed)
Faxed in

## 2013-08-12 ENCOUNTER — Telehealth: Payer: Self-pay | Admitting: Internal Medicine

## 2013-08-12 NOTE — Telephone Encounter (Signed)
Called Dr. Christophe Louis office again and spoke with Drinda Butts. Patient never resch her appointment on 03/06/2013 and no future appointment have been attempted by the office.

## 2013-08-19 ENCOUNTER — Ambulatory Visit (INDEPENDENT_AMBULATORY_CARE_PROVIDER_SITE_OTHER): Payer: No Typology Code available for payment source | Admitting: Internal Medicine

## 2013-08-19 ENCOUNTER — Encounter: Payer: Self-pay | Admitting: Internal Medicine

## 2013-08-19 VITALS — BP 144/80 | HR 83 | Temp 97.5°F | Ht 63.0 in | Wt 194.4 lb

## 2013-08-19 DIAGNOSIS — Z0000 Encounter for general adult medical examination without abnormal findings: Secondary | ICD-10-CM

## 2013-08-19 DIAGNOSIS — M8580 Other specified disorders of bone density and structure, unspecified site: Secondary | ICD-10-CM

## 2013-08-19 DIAGNOSIS — I10 Essential (primary) hypertension: Secondary | ICD-10-CM

## 2013-08-19 DIAGNOSIS — R232 Flushing: Secondary | ICD-10-CM

## 2013-08-19 DIAGNOSIS — Z1239 Encounter for other screening for malignant neoplasm of breast: Secondary | ICD-10-CM

## 2013-08-19 DIAGNOSIS — Z23 Encounter for immunization: Secondary | ICD-10-CM

## 2013-08-19 DIAGNOSIS — M899 Disorder of bone, unspecified: Secondary | ICD-10-CM

## 2013-08-19 DIAGNOSIS — N951 Menopausal and female climacteric states: Secondary | ICD-10-CM

## 2013-08-19 MED ORDER — CALCIUM CARBONATE-VITAMIN D 600-400 MG-UNIT PO TABS: 2.0000 | ORAL_TABLET | Freq: Every day | ORAL | Status: DC

## 2013-08-19 MED ORDER — TRIAMCINOLONE ACETONIDE 0.1 % EX CREA: 1.0000 "application " | TOPICAL_CREAM | Freq: Every day | CUTANEOUS | Status: DC | PRN

## 2013-08-19 NOTE — Progress Notes (Signed)
Kenalog cream and Calcium-Vit D rxs faxed to [facility].

## 2013-08-19 NOTE — Assessment & Plan Note (Signed)
Flu vaccine given today Referred for upcoming mammogram today as well

## 2013-08-19 NOTE — Assessment & Plan Note (Signed)
She wishes to continue calcium and vitamin d supplementation

## 2013-08-19 NOTE — Assessment & Plan Note (Signed)
BP Readings from Last 3 Encounters:  08/19/13 144/80  05/13/13 143/80  04/08/13 142/80   Lab Results  Component Value Date   NA 141 05/13/2013   K 4.1 05/13/2013   CREATININE 0.89 05/13/2013   Assessment: Blood pressure control: controlled Comments: compliant with her current medications  Plan: Medications:  continue current medications amlodipine 5mg  and benicar 20-12.5mg  po qd

## 2013-08-19 NOTE — Progress Notes (Signed)
Subjective:   Patient ID: Eileen Cameron female   DOB: Jun 15, 1951 62 y.o.   MRN: [id number]  HPI: [name] is a 62 y.o. African American female with PMH of hypertension and hyperlipidemia presenting to clinic today for routine follow up visit. She reports compliance with her BP medications.  She asks me today if there is anything to treat her hot flashes.  She says they happen often and at random times, but she has been noticing them more frequently.  She occasionally has them at night as well and reports sweating mainly around her face and neck. She claims they started since menopause in her 64s and never stopped. She denies any chest pain, fever, chills, headaches, syncope, nausea, vomiting, abdominal pain, or any urinary complaints.  No major changes in her medications and claims her symptoms were likely present prior to starting amlodipine. Otherwise, she has no complaints today and claims to be doing well.   She is requesting flu vaccine today and we will schedule her for an upcoming mammogram appointment.   Past Medical History  Diagnosis Date  . Hypertension   . Hyperlipidemia   . Allergy     seasonal allergies, ace inhibitors--angioedema   Current Outpatient Prescriptions  Medication Sig Dispense Refill  . amLODipine (NORVASC) 5 MG tablet Take 1 tablet (5 mg total) by mouth daily.  30 tablet  5  . Coenzyme Q10 (CO Q-10) 100 MG CAPS Take 1 tablet by mouth daily.       . mometasone (NASONEX) 50 MCG/ACT nasal spray Place 2 sprays into the nose daily.  17 g  2  . Multiple Vitamins-Minerals (CENTRUM SILVER ADULT 50+ PO) Take by mouth.      . olmesartan-hydrochlorothiazide (BENICAR HCT) 20-12.5 MG per tablet Take 1 tablet by mouth daily.  30 tablet  5  . Omega-3 Fatty Acids (FISH OIL) 1000 MG CAPS Take 2 capsules by mouth daily.       . OMEGA-3 KRILL OIL 300 MG CAPS Take 1 capsule by mouth daily.       . Simethicone 125 MG CAPS Take 125 mg by mouth daily as needed (for gas).        . Calcium Carbonate-Vitamin D 600-400 MG-UNIT per tablet Take 2 tablets by mouth daily.  60 tablet  5  . triamcinolone cream (KENALOG) 0.1 % Apply 1 application topically daily as needed (apply to rash).  30 g  0   No current facility-administered medications for this visit.   Family History  Problem Relation Age of Onset  . Heart disease Mother 59  . Diabetes Mother   . Hypertension Sister   . Heart disease Maternal Grandmother    History   Social History  . Marital Status: Divorced    Spouse Name: N/A    Number of Children: N/A  . Years of Education: N/A   Social History Main Topics  . Smoking status: Never Smoker   . Smokeless tobacco: None  . Alcohol Use: No  . Drug Use: No  . Sexual Activity: None   Other Topics Concern  . None   Social History Narrative  . None   Review of Systems:  Constitutional:  Denies fever, chills, diaphoresis, appetite change and fatigue.   HEENT:  Occasional nasal congestion. Denies sore throat, rhinorrhea, sneezing, mouth sores, trouble swallowing, neck pain   Respiratory:  Denies SOB, DOE, cough, and wheezing.   Cardiovascular:  Denies chest pain, palpitations, and leg swelling.   Gastrointestinal:  Denies nausea,  vomiting, abdominal pain, diarrhea, constipation, blood in stool and abdominal distention.   Genitourinary:  Denies dysuria, urgency, frequency, hematuria, flank pain and difficulty urinating.   Musculoskeletal:  Denies myalgias, back pain, joint swelling, arthralgias and gait problem.   Skin:  Denies pallor, rash and wound.   Neurological:  "hot flashes" Denies dizziness, seizures, syncope, weakness, light-headedness, numbness and headaches.    Objective:  Physical Exam: Filed Vitals:   08/19/13 1314  BP: 144/80  Pulse: 83  Temp: 97.5 F (36.4 C)  TempSrc: Oral  Height: 5\' 3"  (1.6 m)  Weight: 194 lb 6.4 oz (88.179 kg)  SpO2: 97%   Vitals reviewed. General: sitting in chair, NAD HEENT: PERRL, EOMI, no scleral  icterus Cardiac: RRR, 2/6 SEM loudest at RUS border Pulm: clear to auscultation bilaterally, no wheezes Abd: soft, nontender, nondistended, BS present Ext: warm and well perfused, no pedal edema, +2DP B/L Neuro: alert and oriented X3, cranial nerves II-XII grossly intact, strength and sensation to light touch equal in bilateral upper and lower extremities  Assessment & Plan:  Discussed with Dr. Josem Kaufmann -Diary of hotflashes

## 2013-08-19 NOTE — Assessment & Plan Note (Addendum)
Chronic per description. She claims symptoms started with menopause and never resolved, however, she feel they happen more often now.  Currently not too bothersome but noticeable. She has episodes at random times of the day and night and does not know of any specific trigger. Could be chronic vasomotor flushing but could also possibly be secondary to medications.  Other etiologies however less likely at this time include carcinoid syndrome or even pheochromocytoma among others.   -recommend diary of symptoms at this time for at least 2-3 weeks to find possible trigger. Instructed to notice time of day, activities before, length of onset. Will review once patient has completed, she can drop it to clinic when done.  -discussed possible therapy options if this continues and/or worsens to a point of distress to Eileen Cameron.  Options include SSRI, gabapentin, or even HRT, however, she does not wish to try HRT but says maybe SSRI.  -will continue to monitor

## 2013-08-19 NOTE — Patient Instructions (Signed)
Please keep a diary of your "hot flash" symptoms  Please get your mammogram done  You did get your flu shot today  Please let me know right away if you notice worsening shortness of breath, loss of consciousness or feeling like you may faint, or chest pain.

## 2013-08-20 NOTE — Progress Notes (Signed)
Case discussed with Dr. Qureshi at the time of the visit.  We reviewed the resident's history and exam and pertinent patient test results.  I agree with the assessment, diagnosis and plan of care documented in the resident's note. 

## 2013-09-09 ENCOUNTER — Ambulatory Visit (HOSPITAL_COMMUNITY)
Admission: RE | Admit: 2013-09-09 | Discharge: 2013-09-09 | Disposition: A | Discharge status: Home / Self Care | Payer: No Typology Code available for payment source | Source: Ambulatory Visit | Attending: Internal Medicine | Admitting: Internal Medicine

## 2013-09-09 ENCOUNTER — Other Ambulatory Visit: Payer: Self-pay | Admitting: Internal Medicine

## 2013-09-09 DIAGNOSIS — Z1239 Encounter for other screening for malignant neoplasm of breast: Secondary | ICD-10-CM

## 2013-09-09 DIAGNOSIS — Z1231 Encounter for screening mammogram for malignant neoplasm of breast: Secondary | ICD-10-CM

## 2013-10-27 ENCOUNTER — Other Ambulatory Visit: Payer: Self-pay | Admitting: *Deleted

## 2013-10-27 DIAGNOSIS — J300 Vasomotor rhinitis: Secondary | ICD-10-CM

## 2013-10-27 DIAGNOSIS — I10 Essential (primary) hypertension: Secondary | ICD-10-CM

## 2013-10-27 MED ORDER — MOMETASONE FUROATE 50 MCG/ACT NA SUSP: 2.0000 | Freq: Every day | NASAL | Status: DC

## 2013-10-27 MED ORDER — OLMESARTAN MEDOXOMIL-HCTZ 20-12.5 MG PO TABS: 1.0000 | ORAL_TABLET | Freq: Every day | ORAL | Status: DC

## 2013-11-04 ENCOUNTER — Ambulatory Visit: Payer: Medicare Other

## 2013-11-12 ENCOUNTER — Encounter: Payer: Self-pay | Admitting: Internal Medicine

## 2013-11-12 ENCOUNTER — Ambulatory Visit (INDEPENDENT_AMBULATORY_CARE_PROVIDER_SITE_OTHER): Payer: No Typology Code available for payment source | Admitting: Internal Medicine

## 2013-11-12 VITALS — BP 153/80 | HR 82 | Temp 97.3°F | Ht 63.0 in | Wt 192.8 lb

## 2013-11-12 DIAGNOSIS — J300 Vasomotor rhinitis: Secondary | ICD-10-CM

## 2013-11-12 DIAGNOSIS — R1013 Epigastric pain: Secondary | ICD-10-CM

## 2013-11-12 DIAGNOSIS — I10 Essential (primary) hypertension: Secondary | ICD-10-CM

## 2013-11-12 MED ORDER — TRIAMCINOLONE ACETONIDE 0.1 % EX CREA: 1.0000 "application " | TOPICAL_CREAM | Freq: Two times a day (BID) | CUTANEOUS | Status: DC

## 2013-11-12 MED ORDER — AMLODIPINE BESYLATE 5 MG PO TABS: 5.0000 mg | ORAL_TABLET | Freq: Every day | ORAL | Status: DC

## 2013-11-12 MED ORDER — MOMETASONE FUROATE 50 MCG/ACT NA SUSP: 2.0000 | Freq: Every day | NASAL | Status: DC

## 2013-11-12 MED ORDER — OMEPRAZOLE 20 MG PO CPDR: 20.0000 mg | DELAYED_RELEASE_CAPSULE | Freq: Two times a day (BID) | ORAL | Status: DC

## 2013-11-12 MED ORDER — TRIAMCINOLONE ACETONIDE 0.1 % EX CREA: 1.0000 "application " | TOPICAL_CREAM | Freq: Every day | CUTANEOUS | Status: DC | PRN

## 2013-11-12 MED ORDER — OLMESARTAN MEDOXOMIL-HCTZ 20-12.5 MG PO TABS: 1.0000 | ORAL_TABLET | Freq: Every day | ORAL | Status: DC

## 2013-11-12 NOTE — Patient Instructions (Signed)
It was nice to meet you today.   In terms of your belly pain it maybe some acid reflux and therefore start taking the Omeprazole 20mg  pill twice a day for a month and we will see you back to see how your pain is doing or sooner if you are not improving.   Have a happy new year!Gastroesophageal Reflux Disease, Adult Gastroesophageal reflux disease (GERD) happens when acid from your stomach goes into your food pipe (esophagus). The acid can cause a burning feeling in your chest. Over time, the acid can make small holes (ulcers) in your food pipe.  HOME CARE  Ask your doctor for advice about:  Losing weight.  Quitting smoking.  Alcohol use.  Avoid foods and drinks that make your problems worse. You may want to avoid:  Caffeine and alcohol.  Chocolate.  Mints.  Garlic and onions.  Spicy foods.  Citrus fruits, such as oranges, lemons, or limes.  Foods that contain tomato, such as sauce, chili, salsa, and pizza.  Fried and fatty foods.  Avoid lying down for 3 hours before you go to bed or before you take a nap.  Eat small meals often, instead of large meals.  Wear loose-fitting clothing. Do not wear anything tight around your waist.  Raise (elevate) the head of your bed 6 to 8 inches with wood blocks. Using extra pillows does not help.  Only take medicines as told by your doctor.  Do not take aspirin or ibuprofen. GET HELP RIGHT AWAY IF:   You have pain in your arms, neck, jaw, teeth, or back.  Your pain gets worse or changes.  You feel sick to your stomach (nauseous), throw up (vomit), or sweat (diaphoresis).  You feel short of breath, or you pass out (faint).  Your throw up is green, yellow, black, or looks like coffee grounds or blood.  Your poop (stool) is red, bloody, or black. MAKE SURE YOU:   Understand these instructions.  Will watch your condition.  Will get help right away if you are not doing well or get worse. Document Released: 04/10/2008  Document Revised: 01/15/2012 Document Reviewed: 05/12/2011 [facility] Patient Information 2014 [place], [place].

## 2013-11-13 DIAGNOSIS — R1013 Epigastric pain: Secondary | ICD-10-CM | POA: Insufficient documentation

## 2013-11-13 NOTE — Progress Notes (Signed)
   Subjective:    Patient ID: Eileen Cameron, female    DOB: [birth year], 63 y.o.   MRN: [id number]  Abdominal Pain Pertinent negatives include no constipation, diarrhea, dysuria, fever, nausea or vomiting.  Ankle Pain    Eileen Cameron is a 63 yo woman pmh as listed below presents for some ongoing abdominal pain.   Patient states that pain has been going on since before the Christmas holiday it is mostly located in her upper abdomen exacerbated by heavy meals and sometimes worse when eating and then sleeping. Only intermittently does she notice a sour taste in her mouth and she is currently not on any over-the-counter medication. She did say at one point she tried some Pepto-Bismol that provided immediate relief but has not taking anything consistently since that time. He denied any weight loss secondary to this abdominal pain any nausea/vomiting or diarrhea she doesn't have any postprandial pain and still has them maintains a good appetite. She has noticed some increased flatulence over the holiday season but is not sure whether or not this relieves the abdominal pain. Is not sure about any frank different dietary habits but she has moved in with her daughter and grandson.  Review of Systems  Constitutional: Negative for fever, chills, activity change, fatigue and unexpected weight change.  Respiratory: Negative for cough and shortness of breath.   Cardiovascular: Negative for chest pain, palpitations and leg swelling.  Gastrointestinal: Positive for abdominal pain. Negative for nausea, vomiting, diarrhea, constipation, blood in stool, abdominal distention and anal bleeding.  Genitourinary: Negative for dysuria, flank pain and pelvic pain.  Skin: Negative for rash.  Allergic/Immunologic: Negative for food allergies.    Past Medical History  Diagnosis Date  . Hypertension   . Hyperlipidemia   . Allergy     seasonal allergies, ace inhibitors--angioedema   Social, surgical, family history  reviewed.    Objective:   Physical Exam Filed Vitals:   11/12/13 1036  BP: 153/80  Pulse: 82  Temp: 97.3 F (36.3 C)   General: sitting in chair, NAD HEENT: PERRL, EOMI, no scleral icterus Cardiac: RRR, no rubs, murmurs or gallops Pulm: clear to auscultation bilaterally, moving normal volumes of air Abd: soft, nontender, nondistended, BS present, no organomegaly Ext: warm and well perfused, no pedal edema Neuro: alert and oriented X3, cranial nerves II-XII grossly intact     Assessment & Plan:  Please see problem oriented charting.  Patient discussed with Dr. Lynnae January

## 2013-11-13 NOTE — Progress Notes (Signed)
Case discussed with Dr. Sadek soon after the resident saw the patient.  We reviewed the resident's history and exam and pertinent patient test results.  I agree with the assessment, diagnosis, and plan of care documented in the resident's note. 

## 2013-11-13 NOTE — Assessment & Plan Note (Signed)
Patient has been having pain going on for at least 3-4 weeks making more of a chronic issue there has been no recent exacerbation the symptoms. She's not also having any significant decrease in weight or having uncontrolled abdominal pain. She has nothing localizing on physical exam worrisome for acute abdomen. She does have some supporting factors towards GERD including immediate relief with Pepto-Bismol, exacerbation of some epigastric symptoms or discomfort with heavy meals. -Omeprazole 20 mg twice a day for one month -Followup in one month for resolution of symptoms or before if worsening

## 2013-11-19 ENCOUNTER — Other Ambulatory Visit: Payer: Self-pay | Admitting: Internal Medicine

## 2013-11-19 ENCOUNTER — Telehealth: Payer: Self-pay | Admitting: *Deleted

## 2013-11-19 DIAGNOSIS — R1013 Epigastric pain: Secondary | ICD-10-CM

## 2013-11-19 MED ORDER — ESOMEPRAZOLE MAGNESIUM 20 MG PO CPDR: 20.0000 mg | DELAYED_RELEASE_CAPSULE | Freq: Every day | ORAL | Status: DC

## 2013-11-19 NOTE — Telephone Encounter (Signed)
Can we do nexium 20mg  daily. Per Dr. Gwenlyn Perking note for GERD, this looks to be the right dose instead of 40mg  bid. If this is incorrect or if Dr. Algis Liming wished to prescribe a different dose please check with her. In the meantime, i have placed the order for nexium 20mg , please call or fax in

## 2013-11-19 NOTE — Telephone Encounter (Signed)
Health dept states they can provide nexium 40mg  BID at no charge for pt instead of omeprazole, please change med list if ok

## 2013-11-20 NOTE — Telephone Encounter (Signed)
Called [facility]

## 2013-11-25 ENCOUNTER — Telehealth: Payer: Self-pay | Admitting: Internal Medicine

## 2013-11-25 ENCOUNTER — Encounter: Payer: Self-pay | Admitting: Internal Medicine

## 2013-11-25 NOTE — Telephone Encounter (Signed)
I called [name] today after seeing she had an appointment to see me in [facility].  She was last seen by Dr. Algis Liming on 11/12/13 for complaints of epigastric abdominal pain thought to be secondary to GERD and started on Omeprazole.  She was recommended to return in February for re-evaluation.  Today, Eileen Cameron reports picking up the nexium but has not been taking it daily as prescribed.  She does endorse some mild abdominal pain today after drinking coffee and not taking her nexium.  I reviewed the importance of adherence to PPI therapy daily and to take it at least 30 minutes before or after a meal. She says she will work on compliance and we also discussed avoiding spicy foods and caffeinated drinks as much as possible.  I encouraged her to return to [facility] for further evaluation if she feels like her pain has not improved or gotten better and she said she will reschedule when needed for February as discussed by Dr. Algis Liming. She thanked me for the call.

## 2013-12-29 ENCOUNTER — Ambulatory Visit (INDEPENDENT_AMBULATORY_CARE_PROVIDER_SITE_OTHER): Payer: No Typology Code available for payment source | Admitting: Internal Medicine

## 2013-12-29 ENCOUNTER — Telehealth: Payer: Self-pay | Admitting: *Deleted

## 2013-12-29 ENCOUNTER — Encounter: Payer: Self-pay | Admitting: Internal Medicine

## 2013-12-29 VITALS — BP 141/74 | HR 84 | Temp 97.4°F | Wt 192.5 lb

## 2013-12-29 DIAGNOSIS — J309 Allergic rhinitis, unspecified: Secondary | ICD-10-CM

## 2013-12-29 DIAGNOSIS — R0982 Postnasal drip: Secondary | ICD-10-CM

## 2013-12-29 MED ORDER — SALINE SPRAY 0.65 % NA SOLN: 1.0000 | Freq: Four times a day (QID) | NASAL | Status: DC

## 2013-12-29 MED ORDER — FLUTICASONE PROPIONATE 50 MCG/ACT NA SUSP: 2.0000 | Freq: Every day | NASAL | Status: DC

## 2013-12-29 NOTE — Patient Instructions (Signed)
-You have an upper respiratory infection with allergies.  -Continue using the eye drops reccommended by your eye doctor.  -Start using the saline nasal spray at least 4 times per day.  -Use the flonase nasal spray for your allergies.  -Follow up with your primary care doctor for routine care visit within one month.  -It was great meeting you today!    Allergic Rhinitis Allergic rhinitis is when the mucous membranes in the nose respond to allergens. Allergens are particles in the air that cause your body to have an allergic reaction. This causes you to release allergic antibodies. Through a chain of events, these eventually cause you to release histamine into the blood stream. Although meant to protect the body, it is this release of histamine that causes your discomfort, such as frequent sneezing, congestion, and an itchy, runny nose.  CAUSES  Seasonal allergic rhinitis (hay fever) is caused by pollen allergens that may come from grasses, trees, and weeds. Year-round allergic rhinitis (perennial allergic rhinitis) is caused by allergens such as house dust mites, pet dander, and mold spores.  SYMPTOMS   Nasal stuffiness (congestion).  Itchy, runny nose with sneezing and tearing of the eyes. DIAGNOSIS  Your health care provider can help you determine the allergen or allergens that trigger your symptoms. If you and your health care provider are unable to determine the allergen, skin or blood testing may be used. TREATMENT  Allergic Rhinitis does not have a cure, but it can be controlled by:  Medicines and allergy shots (immunotherapy).  Avoiding the allergen. Hay fever may often be treated with antihistamines in pill or nasal spray forms. Antihistamines block the effects of histamine. There are over-the-counter medicines that may help with nasal congestion and swelling around the eyes. Check with your health care provider before taking or giving this medicine.  If avoiding the allergen or the  medicine prescribed do not work, there are many new medicines your health care provider can prescribe. Stronger medicine may be used if initial measures are ineffective. Desensitizing injections can be used if medicine and avoidance does not work. Desensitization is when a patient is given ongoing shots until the body becomes less sensitive to the allergen. Make sure you follow up with your health care provider if problems continue. HOME CARE INSTRUCTIONS It is not possible to completely avoid allergens, but you can reduce your symptoms by taking steps to limit your exposure to them. It helps to know exactly what you are allergic to so that you can avoid your specific triggers. SEEK MEDICAL CARE IF:   You have a fever.  You develop a cough that does not stop easily (persistent).  You have shortness of breath.  You start wheezing.  Symptoms interfere with normal daily activities. Document Released: 07/18/2001 Document Revised: 08/13/2013 Document Reviewed: 06/30/2013 [facility] Patient Information 2014 [place].  Upper Respiratory Infection, Adult An upper respiratory infection (URI) is also known as the common cold. It is often caused by a type of germ (virus). Colds are easily spread (contagious). You can pass it to others by kissing, coughing, sneezing, or drinking out of the same glass. Usually, you get better in 1 or 2 weeks.  HOME CARE   Only take medicine as told by your doctor.  Use a warm mist humidifier or breathe in steam from a hot shower.  Drink enough water and fluids to keep your pee (urine) clear or pale yellow.  Get plenty of rest.  Return to work when your temperature  is back to normal or as told by your doctor. You may use a face mask and wash your hands to stop your cold from spreading. GET HELP RIGHT AWAY IF:   After the first few days, you feel you are getting worse.  You have questions about your medicine.  You have chills, shortness of breath, or brown  or red spit (mucus).  You have yellow or brown snot (nasal discharge) or pain in the face, especially when you bend forward.  You have a fever, puffy (swollen) neck, pain when you swallow, or white spots in the back of your throat.  You have a bad headache, ear pain, sinus pain, or chest pain.  You have a high-pitched whistling sound when you breathe in and out (wheezing).  You have a lasting cough or cough up blood.  You have sore muscles or a stiff neck. MAKE SURE YOU:   Understand these instructions.  Will watch your condition.  Will get help right away if you are not doing well or get worse. Document Released: 04/10/2008 Document Revised: 01/15/2012 Document Reviewed: 02/27/2011 [facility] Patient Information 2014 [place], [place].

## 2013-12-29 NOTE — Telephone Encounter (Signed)
Pt calls c/o "a cold" for 3 to 4 weeks states she will be going to ED if not given appt, appt given for this pm dr Hayes Ludwig.

## 2013-12-29 NOTE — Telephone Encounter (Signed)
Ok, thanks.

## 2013-12-31 DIAGNOSIS — J302 Other seasonal allergic rhinitis: Secondary | ICD-10-CM | POA: Insufficient documentation

## 2013-12-31 NOTE — Progress Notes (Signed)
   Subjective:    Patient ID: Eileen Cameron, female    DOB: 1951-04-25, 63 y.o.   MRN: [id number]  URI  Associated symptoms include congestion, coughing, ear pain and rhinorrhea. Pertinent negatives include no abdominal pain, chest pain, diarrhea, dysuria, headaches, nausea, rash, sore throat, vomiting or wheezing.   [name] is a 63 year old woman with PMH significant for HTN who presents for acute visit for URI symptoms. For the past 3-4 weeks she has had a cough productive of scant clear sputum (described as hacking/post-nasal drip) that is worse at night, itchy watery eyes, runny nose with clear discharge, transient ear pain, throat itching. She started taking a generic OTC allergy medicine one week ago but saw no improvement of her symptoms.  She states that her daughter is taking antibiotics for sinusitis and she was worried she also needed antibiotics. She has had seasonal allergies for years, saw an allergist years ago but had not been on allergy medications for a long time.    Review of Systems  Constitutional: Negative for fever, chills, diaphoresis, activity change, appetite change, fatigue and unexpected weight change.  HENT: Positive for congestion, ear pain, postnasal drip and rhinorrhea. Negative for dental problem, ear discharge, hearing loss, nosebleeds, sinus pressure, sore throat, tinnitus, trouble swallowing and voice change.        Throat itching, postnasal drip  Eyes: Positive for itching. Negative for photophobia, pain, discharge, redness and visual disturbance.  Respiratory: Positive for cough. Negative for chest tightness, shortness of breath, wheezing and stridor.   Cardiovascular: Negative for chest pain, palpitations and leg swelling.  Gastrointestinal: Negative for nausea, vomiting, abdominal pain, diarrhea and constipation.  Genitourinary: Negative for dysuria.  Musculoskeletal: Negative for back pain and myalgias.  Skin: Negative for rash.  Allergic/Immunologic:  Positive for environmental allergies.  Neurological: Negative for dizziness, weakness, light-headedness and headaches.  Hematological: Negative for adenopathy.  Psychiatric/Behavioral: Negative for behavioral problems and agitation.       Objective:   Physical Exam  Nursing note and vitals reviewed. Constitutional: She is oriented to person, place, and time. She appears well-developed. No distress.  Obese  HENT:  Right Ear: External ear normal.  Left Ear: External ear normal.  Mouth/Throat: Oropharynx is clear and moist. No oropharyngeal exudate.  Pale nasal turbinates bilaterally with no discharge  Eyes: Conjunctivae are normal. Right eye exhibits no discharge. Left eye exhibits no discharge. No scleral icterus.  Neck: No thyromegaly present.  Cardiovascular: Normal rate and regular rhythm.   Pulmonary/Chest: Effort normal and breath sounds normal. No respiratory distress. She has no wheezes. She has no rales.  Abdominal: Soft. There is no tenderness.  Musculoskeletal: She exhibits no edema and no tenderness.  Lymphadenopathy:    She has no cervical adenopathy.  Neurological: She is alert and oriented to person, place, and time.  Skin: Skin is warm and dry. No rash noted. She is not diaphoretic. No erythema. No pallor.  Psychiatric: She has a normal mood and affect.          Assessment & Plan:

## 2013-12-31 NOTE — Progress Notes (Signed)
INTERNAL MEDICINE TEACHING ATTENDING ADDENDUM - Amiel Sharrow, DO: I reviewed and discussed at the time of visit with the resident Dr. Kennerly, the patient's medical history, physical examination, diagnosis and results of tests and treatment and I agree with the patient's care as documented. 

## 2013-12-31 NOTE — Assessment & Plan Note (Addendum)
Patient with symptoms of allergic rhinitis with no dental pain, no purulent nasal discharge, no sinus tenderness/pain.  Rx of Flonase given to patient. Called [facility], they had free samples of this prescription ready for pick up.  Pt is interested in starting Allegra OTC as well but will wait until after using Flonase for a while to start this medications.  Saline nasal spray 4 x daily for nasal congestion/moisturizing -Pt to continue using eye drops prescribed by her eye doctor as she has seen improvement of her itchy watery eyes -Pt advised to follow up with her PCP for reevaluation of her symptoms and possible referral to an Allergist if her symptoms do not improve.

## 2014-01-14 ENCOUNTER — Ambulatory Visit (INDEPENDENT_AMBULATORY_CARE_PROVIDER_SITE_OTHER): Payer: No Typology Code available for payment source | Admitting: Internal Medicine

## 2014-01-14 ENCOUNTER — Encounter: Payer: Self-pay | Admitting: Internal Medicine

## 2014-01-14 VITALS — BP 142/79 | HR 83 | Temp 97.4°F | Ht 63.0 in | Wt 194.0 lb

## 2014-01-14 DIAGNOSIS — Z124 Encounter for screening for malignant neoplasm of cervix: Secondary | ICD-10-CM

## 2014-01-14 DIAGNOSIS — R1031 Right lower quadrant pain: Secondary | ICD-10-CM | POA: Insufficient documentation

## 2014-01-14 LAB — COMPREHENSIVE METABOLIC PANEL
AST: 16 U/L (ref 0–37)
Albumin: 4.4 g/dL (ref 3.5–5.2)
Alkaline Phosphatase: 91 U/L (ref 39–117)
BILIRUBIN TOTAL: 0.6 mg/dL (ref 0.2–1.2)
BUN: 10 mg/dL (ref 6–23)
CALCIUM: 9.4 mg/dL (ref 8.4–10.5)
CO2: 29 mEq/L (ref 19–32)
CREATININE: 0.69 mg/dL (ref 0.50–1.10)
Chloride: 104 mEq/L (ref 96–112)
Glucose, Bld: 96 mg/dL (ref 70–99)
Potassium: 3.7 mEq/L (ref 3.5–5.3)
Sodium: 141 mEq/L (ref 135–145)
Total Protein: 7.1 g/dL (ref 6.0–8.3)

## 2014-01-14 LAB — CBC WITH DIFFERENTIAL/PLATELET
BASOS ABS: 0 10*3/uL (ref 0.0–0.1)
BASOS PCT: 0 % (ref 0–1)
EOS ABS: 0 10*3/uL (ref 0.0–0.7)
EOS PCT: 1 % (ref 0–5)
HCT: 39.8 % (ref 36.0–46.0)
Hemoglobin: 13.5 g/dL (ref 12.0–15.0)
LYMPHS ABS: 2 10*3/uL (ref 0.7–4.0)
Lymphocytes Relative: 43 % (ref 12–46)
MCH: 30.1 pg (ref 26.0–34.0)
MCHC: 33.9 g/dL (ref 30.0–36.0)
MCV: 88.8 fL (ref 78.0–100.0)
Monocytes Absolute: 0.5 10*3/uL (ref 0.1–1.0)
Monocytes Relative: 10 % (ref 3–12)
NEUTROS PCT: 46 % (ref 43–77)
Neutro Abs: 2.2 10*3/uL (ref 1.7–7.7)
PLATELETS: 279 10*3/uL (ref 150–400)
RBC: 4.48 MIL/uL (ref 3.87–5.11)
RDW: 13.9 % (ref 11.5–15.5)
WBC: 4.7 10*3/uL (ref 4.0–10.5)

## 2014-01-14 NOTE — Progress Notes (Signed)
   Subjective:    Patient ID: Eileen Cameron, female    DOB: Oct 12, 1951, 62 y.o.   MRN: [id number]  HPI  Eileen Cameron presents with complaints of pain in her right pelvic area for a week and a half. She can point to a specific area in the RLQ of her abdomen and describes the discomfort as deep with a burning quality.  Never had this before.  Endorses heart burn symptoms, a diarrhea x 2 days last week, and mild nausea without vomiting.  No fever, pain does not change with position. Hx significant for lumbar radiculopathy and hypertension.   Review of Systems  Constitutional: Negative.   HENT: Negative.   Eyes: Negative.   Respiratory: Negative.   Cardiovascular: Negative.   Gastrointestinal: Negative.        Lower right abdominal pain with mild discomfort along upper right medial thigh  Genitourinary: Negative.   Musculoskeletal:       Pain right side  Skin: Negative.   Allergic/Immunologic: Positive for environmental allergies.  Neurological: Negative.   Psychiatric/Behavioral: Negative.        Objective:   Physical Exam  Constitutional: She is oriented to person, place, and time. She appears well-developed and well-nourished. No distress.  HENT:  Head: Normocephalic and atraumatic.  Eyes: Conjunctivae and EOM are normal. Pupils are equal, round, and reactive to light.  Neck: Normal range of motion.  Cardiovascular: Normal rate, regular rhythm and normal heart sounds.   Pulmonary/Chest: Effort normal and breath sounds normal.  Abdominal: Soft. Bowel sounds are normal. She exhibits no distension. There is tenderness in the right lower quadrant. There is no rebound and no guarding.    + Psoas maneuver with pain on flexion of right hip - Rovsing without pain elicited on palpation LLQ equivocal Obturator on int and ext rotation of right thigh  Genitourinary: Uterus normal. There is no rash or tenderness on the right labia. There is no rash or tenderness on the left labia. Cervix  exhibits no motion tenderness, no discharge and no friability. Right adnexum displays no mass and no tenderness. Left adnexum displays no mass and no tenderness. No erythema or bleeding around the vagina. No vaginal discharge found.  Could not palpate ovaries secondary abdominal girth.  Lymphadenopathy:       Right: No inguinal adenopathy present.       Left: No inguinal adenopathy present.  Neurological: She is alert and oriented to person, place, and time.  + Psoas maneuver with pain on flexion of right hip - Rovsing without pain elicited on palpation LLQ equivical Obturator on int and ext rotation of right thigh  Skin: Skin is warm and dry.  Psychiatric: She has a normal mood and affect.          Assessment & Plan:  See separate problem list charting:

## 2014-01-14 NOTE — Patient Instructions (Signed)
It is unclear what is the cause of your lower belly pain. We will have you get blood work and a CT Scan to evaluate your belly pain further.

## 2014-01-14 NOTE — Assessment & Plan Note (Signed)
RLQ pain x 1.5 weeks associated with mild decreased appetite, mild nausea, no fever, brief diarrhea, increased reflux, burning quality, positive Psoas maneuver, neg Rovsing, Obturator indeterminate.  No guarding, no rigidity, no rebound. Normal pelvic exam, no vaginal atrophy noted, ovaries unable to be palpated secondary to abdominal girth on bimanual exam, no reproducible pain on pelvic exam.  -[place] abd/ trans vag r/o appendicitis vs ovarian etiology

## 2014-01-15 ENCOUNTER — Other Ambulatory Visit: Payer: Self-pay | Admitting: Internal Medicine

## 2014-01-15 ENCOUNTER — Ambulatory Visit (HOSPITAL_COMMUNITY)
Admission: RE | Admit: 2014-01-15 | Discharge: 2014-01-15 | Disposition: A | Discharge status: Home / Self Care | Payer: No Typology Code available for payment source | Source: Ambulatory Visit | Attending: Internal Medicine | Admitting: Internal Medicine

## 2014-01-15 DIAGNOSIS — R1031 Right lower quadrant pain: Secondary | ICD-10-CM

## 2014-01-15 DIAGNOSIS — R109 Unspecified abdominal pain: Secondary | ICD-10-CM | POA: Insufficient documentation

## 2014-01-15 DIAGNOSIS — I880 Nonspecific mesenteric lymphadenitis: Secondary | ICD-10-CM | POA: Insufficient documentation

## 2014-01-15 LAB — URINALYSIS, ROUTINE W REFLEX MICROSCOPIC
Bilirubin Urine: NEGATIVE
Glucose, UA: NEGATIVE mg/dL
HGB URINE DIPSTICK: NEGATIVE
KETONES UR: NEGATIVE mg/dL
LEUKOCYTES UA: NEGATIVE
NITRITE: NEGATIVE
PROTEIN: NEGATIVE mg/dL
Specific Gravity, Urine: 1.015 (ref 1.005–1.030)
UROBILINOGEN UA: 0.2 mg/dL (ref 0.0–1.0)
pH: 6 (ref 5.0–8.0)

## 2014-01-15 MED ORDER — CIPROFLOXACIN HCL 500 MG PO TABS: 500.0000 mg | ORAL_TABLET | Freq: Two times a day (BID) | ORAL | Status: DC

## 2014-01-15 MED ORDER — IOHEXOL 300 MG/ML  SOLN
80.0000 mL | Freq: Once | INTRAMUSCULAR | Status: AC | PRN
  Administered 2014-01-15: 80 mL via INTRAVENOUS

## 2014-01-15 NOTE — Progress Notes (Signed)
Cipro rx faxed to [facility].

## 2014-01-16 NOTE — Progress Notes (Signed)
Case discussed with Dr. Schooler at the time of the visit.  We reviewed the resident's history and exam and pertinent patient test results.  I agree with the assessment, diagnosis, and plan of care documented in the resident's note.     

## 2014-01-19 ENCOUNTER — Ambulatory Visit (HOSPITAL_COMMUNITY): Payer: No Typology Code available for payment source

## 2014-03-03 ENCOUNTER — Other Ambulatory Visit: Payer: Self-pay | Admitting: *Deleted

## 2014-03-04 MED ORDER — ESOMEPRAZOLE MAGNESIUM 20 MG PO CPDR: 20.0000 mg | DELAYED_RELEASE_CAPSULE | Freq: Every day | ORAL | Status: DC

## 2014-03-31 ENCOUNTER — Ambulatory Visit (INDEPENDENT_AMBULATORY_CARE_PROVIDER_SITE_OTHER): Payer: Medicare Other | Admitting: Internal Medicine

## 2014-03-31 ENCOUNTER — Encounter: Payer: Self-pay | Admitting: Internal Medicine

## 2014-03-31 VITALS — BP 155/80 | HR 80 | Temp 97.6°F | Ht 63.0 in | Wt 191.9 lb

## 2014-03-31 DIAGNOSIS — Z23 Encounter for immunization: Secondary | ICD-10-CM | POA: Diagnosis not present

## 2014-03-31 DIAGNOSIS — J302 Other seasonal allergic rhinitis: Secondary | ICD-10-CM

## 2014-03-31 DIAGNOSIS — I10 Essential (primary) hypertension: Secondary | ICD-10-CM

## 2014-03-31 DIAGNOSIS — J309 Allergic rhinitis, unspecified: Secondary | ICD-10-CM

## 2014-03-31 DIAGNOSIS — Z0000 Encounter for general adult medical examination without abnormal findings: Secondary | ICD-10-CM

## 2014-03-31 MED ORDER — FEXOFENADINE HCL 180 MG PO TABS: 180.0000 mg | ORAL_TABLET | Freq: Every day | ORAL | Status: DC

## 2014-03-31 MED ORDER — TRIAMCINOLONE ACETONIDE 0.1 % EX CREA: 1.0000 "application " | TOPICAL_CREAM | Freq: Two times a day (BID) | CUTANEOUS | Status: DC

## 2014-03-31 MED ORDER — SIMETHICONE 125 MG PO CAPS: 125.0000 mg | ORAL_CAPSULE | Freq: Every day | ORAL | Status: DC | PRN

## 2014-03-31 MED ORDER — OLMESARTAN MEDOXOMIL-HCTZ 20-12.5 MG PO TABS: 1.0000 | ORAL_TABLET | Freq: Every day | ORAL | Status: DC

## 2014-03-31 MED ORDER — CALCIUM CARBONATE-VITAMIN D 600-400 MG-UNIT PO TABS: 2.0000 | ORAL_TABLET | Freq: Every day | ORAL | Status: DC

## 2014-03-31 MED ORDER — AMLODIPINE BESYLATE 5 MG PO TABS: 5.0000 mg | ORAL_TABLET | Freq: Every day | ORAL | Status: DC

## 2014-03-31 NOTE — Assessment & Plan Note (Signed)
BP Readings from Last 3 Encounters:  03/31/14 155/80  01/14/14 142/79  12/29/13 141/74   Lab Results  Component Value Date   NA 141 01/14/2014   K 3.7 01/14/2014   CREATININE 0.69 01/14/2014   Assessment: Blood pressure control: moderately elevated Progress toward BP goal:  deteriorated  Plan: Medications:  continue current medications benicar and norvasc. Gave paper prescriptions today since she is changing her pharmacy to [facility]

## 2014-03-31 NOTE — Progress Notes (Signed)
Subjective:   Patient ID: Eileen Cameron female   DOB: 04/17/1951 63 y.o.   MRN: [id number]  HPI: [name] is a 63 y.o. African American female with PMH of HTN presenting to [facility] today for routine visit. She reports worsening of her seasonal allergies with no relief from claritin or zyrtec in the past. She has not tried Human resources officer and is willing to try that now given her congestion and watery eyes. She also reports nasal congestion relief with prior nasal spray [facility] but she cannot remember the name. It seems it is likely nasonex based on prior prescriptions but she is unsure and will check when she gets home in case she has old bottle and will also check with new pharmacy and records. Currently, she claims flonase and the otc nasal spray has provided minimal if any relief. She still has intermittent hot flashes.   Past Medical History  Diagnosis Date  . Hypertension   . Hyperlipidemia   . Allergy     seasonal allergies, ace inhibitors--angioedema   Current Outpatient Prescriptions  Medication Sig Dispense Refill  . amLODipine (NORVASC) 5 MG tablet Take 1 tablet (5 mg total) by mouth daily.  30 tablet  5  . Calcium Carbonate-Vitamin D 600-400 MG-UNIT per tablet Take 2 tablets by mouth daily.  60 tablet  5  . ciprofloxacin (CIPRO) 500 MG tablet Take 1 tablet (500 mg total) by mouth 2 (two) times daily.  14 tablet  0  . Coenzyme Q10 (CO Q-10) 100 MG CAPS Take 1 tablet by mouth daily.       Marland Kitchen esomeprazole (NEXIUM) 20 MG capsule Take 1 capsule (20 mg total) by mouth daily.  30 capsule  1  . fluticasone (FLONASE) 50 MCG/ACT nasal spray Place 2 sprays into both nostrils daily.  16 g  2  . Multiple Vitamins-Minerals (CENTRUM SILVER ADULT 50+ PO) Take by mouth.      . olmesartan-hydrochlorothiazide (BENICAR HCT) 20-12.5 MG per tablet Take 1 tablet by mouth daily.  30 tablet  11  . Omega-3 Fatty Acids (FISH OIL) 1000 MG CAPS Take 2 capsules by mouth daily.       .  OMEGA-3 KRILL OIL 300 MG CAPS Take 1 capsule by mouth daily.       . Simethicone 125 MG CAPS Take 125 mg by mouth daily as needed (for gas).      . sodium chloride (OCEAN) 0.65 % SOLN nasal spray Place 1 spray into both nostrils 4 (four) times daily.  15 mL  3  . triamcinolone cream (KENALOG) 0.1 % Apply 1 application topically 2 (two) times daily.  30 g  0  . triamcinolone cream (KENALOG) 0.1 % Apply 1 application topically daily as needed (apply to rash).  30 g  0   No current facility-administered medications for this visit.   Family History  Problem Relation Age of Onset  . Heart disease Mother 37  . Diabetes Mother   . Hypertension Sister   . Heart disease Maternal Grandmother    History   Social History  . Marital Status: Divorced    Spouse Name: N/A    Number of Children: N/A  . Years of Education: N/A   Social History Main Topics  . Smoking status: Never Smoker   . Smokeless tobacco: None  . Alcohol Use: No  . Drug Use: No  . Sexual Activity: None   Other Topics Concern  . None   Social History  Narrative  . None   Review of Systems:  Constitutional:  Denies fever, chills, diaphoresis, appetite change  HEENT:  Nasal congestion  Respiratory:  Denies SOB, DOE, cough, and wheezing.   Cardiovascular:  Denies chest pain, and leg swelling. Occasional feeling "skipped beat"  Gastrointestinal:  Denies nausea, vomiting  Genitourinary:  Denies dysuria   Musculoskeletal:  Denies myalgias  Skin:  Denies pallor, rash and wound.   Neurological:  Denies dizziness   Objective:  Physical Exam: Filed Vitals:   03/31/14 1539  BP: 155/80  Pulse: 80  Temp: 97.6 F (36.4 C)  TempSrc: Oral  Height: 5\' 3"  (1.6 m)  Weight: 191 lb 14.4 oz (87.045 kg)  SpO2: 99%   Vitals reviewed. General: sitting in chair, NAD HEENT: EOMI Cardiac: RRR, 1/6 SEM heard loudest upper sternal borders Pulm: clear to auscultation bilaterally, no wheezes, rales, or rhonchi Abd: soft, nontender,  nondistended, BS present Ext: warm and well perfused, no pedal edema, moving all 4 extremities Neuro: alert and oriented X3, cranial nerves II-XII grossly intact, strength and sensation to light touch equal in bilateral upper and lower extremities  Assessment & Plan:  Discussed with Dr. Stann Mainland Allergic rhinitis--Allegra

## 2014-03-31 NOTE — Patient Instructions (Signed)
Please check with your pharmacy and home medications and call me with what nasal spray you believed worked  I have provided you with all your prescriptions to be filled at new pharmacy  You have gotten your pneumovax  Today  Try allegra for allergy relief  Fexofenadine capsules or tablets What is this medicine? FEXOFENADINE (fex oh FEN a deen) is an antihistamine. This medicine is used to treat or prevent symptoms of allergies. It is also used to help reduce itchy skin rash and hives. This medicine may be used for other purposes; ask your health care provider or pharmacist if you have questions. COMMON BRAND NAME(S): Allegra Allergy 12 Hour , Allegra Allergy 24 Hour, Allegra Children's Allergy , Allegra What should I tell my health care provider before I take this medicine? They need to know if you have any of these conditions: -kidney disease -an unusual or allergic reaction to fexofenadine, terfenadine, other medicines, foods, dyes, or preservatives -pregnant or trying to get pregnant -breast-feeding How should I use this medicine? Take this medicine by mouth with a full glass of water. Follow the directions on the prescription label. You may take this medicine with food or on an empty stomach. Take your medicine at regular intervals. Do not take it more often than directed. You may need to take this medicine for several days before your symptoms improve. Talk to your pediatrician regarding the use of this medicine in children. While this drug may be prescribed for children as young as 30 years old for selected conditions, precautions do apply. Overdosage: If you think you have taken too much of this medicine contact a poison control center or emergency room at once. NOTE: This medicine is only for you. Do not share this medicine with others. What if I miss a dose? If you miss a dose, take it as soon as you can. If it is almost time for your next dose, take only that dose. Do not take  double or extra doses. What may interact with this medicine? -antacids -erythromycin -grapefruit, apple, or orange juice -ketoconazole -magnesium-containing products This list may not describe all possible interactions. Give your health care provider a list of all the medicines, herbs, non-prescription drugs, or dietary supplements you use. Also tell them if you smoke, drink alcohol, or use illegal drugs. Some items may interact with your medicine. What should I watch for while using this medicine? Visit your doctor or health care professional for regular checks on your health. Tell your doctor or healthcare professional if your symptoms do not start to get better or if they get worse. What side effects may I notice from receiving this medicine? Side effects that you should report to your doctor or health care professional as soon as possible: -allergic reactions like skin rash, itching or hives, swelling of the face, lips, or tongue -breathing problems -chest pain -fast heartbeat -infection or fever Side effects that usually do not require medical attention (report to your doctor or health care professional if they continue or are bothersome): -cough -drowsiness -dry or irritated nose, mouth, or throat -headache -menstrual changes -pain -stomach upset, nausea This list may not describe all possible side effects. Call your doctor for medical advice about side effects. You may report side effects to FDA at 1-800-FDA-1088. Where should I keep my medicine? Keep out of the reach of children. Store at room temperature between 20 and 25 degrees C (68 and 77degrees F). Protect from moisture. Throw away any unused medicine after the  expiration date. NOTE: This sheet is a summary. It may not cover all possible information. If you have questions about this medicine, talk to your doctor, pharmacist, or health care provider.  2014, Elsevier/Gold Standard. (2006-07-25 12:30:00)

## 2014-03-31 NOTE — Assessment & Plan Note (Signed)
Pneumovax today 

## 2014-03-31 NOTE — Assessment & Plan Note (Addendum)
Minimal relief with flonase and otc nasal spray. Reports relief in the past with ?medication but does not recall name. Maybe nasonex per chart review, but she will try to see if she has old bottle at home and check with pharmacy and let me know.   -will start allegra, no relief with claritin and zyrtec in the past -if no improvement in symptoms, will consider referral or allergist

## 2014-04-01 ENCOUNTER — Telehealth: Payer: Self-pay | Admitting: *Deleted

## 2014-04-01 NOTE — Telephone Encounter (Signed)
Call from pt - pt states she uses Nasonex and Flonase nasal sprays. Thanks

## 2014-04-01 NOTE — Progress Notes (Signed)
Case discussed with Dr. Qureshi soon after the resident saw the patient.  We reviewed the resident's history and exam and pertinent patient test results.  I agree with the assessment, diagnosis, and plan of care documented in the resident's note. 

## 2014-04-01 NOTE — Telephone Encounter (Signed)
Thank you.  I spoke to Eileen Cameron on the phone today. She would like to continue with flonase and will also continue nexium. She has omeprazole at home and nexium., but will only take nexium from now on.

## 2014-05-19 ENCOUNTER — Ambulatory Visit (INDEPENDENT_AMBULATORY_CARE_PROVIDER_SITE_OTHER): Payer: Medicare Other | Admitting: Internal Medicine

## 2014-05-19 ENCOUNTER — Encounter: Payer: Self-pay | Admitting: Internal Medicine

## 2014-05-19 VITALS — BP 149/79 | HR 75 | Temp 97.1°F | Ht 63.0 in | Wt 188.7 lb

## 2014-05-19 DIAGNOSIS — I10 Essential (primary) hypertension: Secondary | ICD-10-CM | POA: Diagnosis not present

## 2014-05-19 DIAGNOSIS — L821 Other seborrheic keratosis: Secondary | ICD-10-CM

## 2014-05-22 DIAGNOSIS — L821 Other seborrheic keratosis: Secondary | ICD-10-CM | POA: Insufficient documentation

## 2014-05-22 NOTE — Assessment & Plan Note (Signed)
Left side abdomen, present for several years, now itchy. Does scrap off when scratching and bled with itching a few days ago. Currently no inflamed, tender, and no bleeding or erythema. She was in a rush to leave as she had to pick up her grandchild from soon.   -will do supportive measures for now with itching, cortisone cream she wishes to apply which I think should be okay -shave biopsy if still present or bothersome, cautioned on warning signs of changing shape, color, more itching or bleeding -may need dermatology or cryotherapy (unsure if can be done in our clinic)

## 2014-05-22 NOTE — Assessment & Plan Note (Signed)
BP Readings from Last 3 Encounters:  05/19/14 149/79  03/31/14 155/80  01/14/14 142/79   Lab Results  Component Value Date   NA 141 01/14/2014   K 3.7 01/14/2014   CREATININE 0.69 01/14/2014   Assessment: Blood pressure control:   Progress toward BP goal:    Comments: slightly elevated but improved since last week  Plan: Medications:  continue current medications norvasc 5mg  and benicar 20-12.5mg 

## 2014-05-22 NOTE — Progress Notes (Signed)
Subjective:   Patient ID: Eileen Cameron female   DOB: [birth year] 63 y.o.   MRN: [id number]  HPI: Eileen Cameron is a 63 y.o. female with HTN presenting to [facility] today for acute visit.   L lower abdominal "mole" that has been present for several years and recently more itchy than usual. She says it did scrape off sometime ago because it was itching and when she was scratching she noticed some mild bleeding but that has since then resolved. She denies change in color, size, or shape. No tenderness or erythema. No other places on body with similar lesion.   Past Medical History  Diagnosis Date  . Hypertension   . Hyperlipidemia   . Allergy     seasonal allergies, ace inhibitors--angioedema   Current Outpatient Prescriptions  Medication Sig Dispense Refill  . amLODipine (NORVASC) 5 MG tablet Take 1 tablet (5 mg total) by mouth daily.  30 tablet  5  . Calcium Carbonate-Vitamin D 600-400 MG-UNIT per tablet Take 2 tablets by mouth daily.  60 tablet  5  . Coenzyme Q10 (CO Q-10) 100 MG CAPS Take 1 tablet by mouth daily.       Marland Kitchen esomeprazole (NEXIUM) 20 MG capsule Take 1 capsule (20 mg total) by mouth daily.  30 capsule  1  . fexofenadine (ALLEGRA ALLERGY) 180 MG tablet Take 1 tablet (180 mg total) by mouth daily.  30 tablet  3  . fluticasone (FLONASE) 50 MCG/ACT nasal spray Place 2 sprays into both nostrils daily.  16 g  2  . Multiple Vitamins-Minerals (CENTRUM SILVER ADULT 50+ PO) Take by mouth.      . olmesartan-hydrochlorothiazide (BENICAR HCT) 20-12.5 MG per tablet Take 1 tablet by mouth daily.  30 tablet  11  . Omega-3 Fatty Acids (FISH OIL) 1000 MG CAPS Take 2 capsules by mouth daily.       . OMEGA-3 KRILL OIL 300 MG CAPS Take 1 capsule by mouth daily.       . Simethicone 125 MG CAPS Take 1 capsule (125 mg total) by mouth daily as needed (for gas).  28 each  0  . sodium chloride (OCEAN) 0.65 % SOLN nasal spray Place 1 spray into both nostrils 4 (four) times daily.  15 mL  3  .  triamcinolone cream (KENALOG) 0.1 % Apply 1 application topically 2 (two) times daily.  30 g  0   No current facility-administered medications for this visit.   Family History  Problem Relation Age of Onset  . Heart disease Mother 2  . Diabetes Mother   . Hypertension Sister   . Heart disease Maternal Grandmother    History   Social History  . Marital Status: Divorced    Spouse Name: N/A    Number of Children: N/A  . Years of Education: N/A   Social History Main Topics  . Smoking status: Never Smoker   . Smokeless tobacco: None  . Alcohol Use: No  . Drug Use: No  . Sexual Activity: None   Other Topics Concern  . None   Social History Narrative  . None   Review of Systems:  Constitutional:  Denies fever, chills  HEENT:  Occasional nasal congestion  Respiratory:  Denies SOB  Cardiovascular:  Denies chest pain  Gastrointestinal:  Denies nausea, vomiting, abdominal pain  Genitourinary:  Denies dysuria  Musculoskeletal:  Denies myalgias.   Skin:  L abdominal mole--itchy   Neurological:  Denies dizziness   Objective:  Physical Exam: Filed Vitals:  05/19/14 1521  BP: 149/79  Pulse: 75  Temp: 97.1 F (36.2 C)  TempSrc: Oral  Height: 5\' 3"  (1.6 m)  Weight: 188 lb 11.2 oz (85.594 kg)  SpO2: 98%   Vitals reviewed. General: sitting in chair, NAD HEENT: EOMI Cardiac: RRR Pulm: clear to auscultation bilaterally, no wheezes, rales, or rhonchi Abd: soft, nontender, nondistended, BS present Ext: warm and well perfused, no pedal edema Neuro: alert and oriented X3 Skin: L lower abdominal darkened lesion, raised surface, dry cracked surface, no bleeding, erythema or edema, no tenderness to palpation, oval/circular shape ~2cm in length, itchy   Assessment & Plan:  Discussed with Dr. Berneice Gandy for now, supportive measure

## 2014-05-22 NOTE — Progress Notes (Signed)
INTERNAL MEDICINE TEACHING ATTENDING ADDENDUM - Aldine Contes, MD: I personally saw and evaluated Eileen Cameron in this clinic visit in conjunction with the resident, Dr. Eula Fried. I have discussed patient's plan of care with medical resident during this visit. I have confirmed the physical exam findings and have read and agree with the clinic note including the plan with the following addition: - Patient with mole over abdomen which is now pruritic and mild bleeding - On exam- Patient with likely sebhorreic keratosis, no active bleeding - Will continue with supportive measures for now - If persists or worsening pruritis or bleeding will consider shave biopsy pr referral to dermatology

## 2014-05-22 NOTE — Patient Instructions (Signed)
General Instructions:   Please bring your medicines with you each time you come to clinic.  Medicines may include prescription medications, over-the-counter medications, herbal remedies, eye drops, vitamins, or other pills.  Continue to watch your lesion, try not to scratch. You can apply cortisone cream for itching relief if that helps.   If it changes shape or size or you have more itching or bleeding return or call opc and we will need to treat further, maybe shave biopsy or dermatology referral if needed.   Progress Toward Treatment Goals:  Treatment Goal 05/19/2014  Blood pressure improved    Self Care Goals & Plans:  Self Care Goal 05/19/2014  Manage my medications bring my medications to every visit  Monitor my health -  Eat healthy foods -  Be physically active -    No flowsheet data found.   Care Management & Community Referrals:  Referral 01/14/2013  Referrals made for care management support none needed

## 2014-07-01 ENCOUNTER — Other Ambulatory Visit: Payer: Self-pay | Admitting: *Deleted

## 2014-07-01 DIAGNOSIS — I10 Essential (primary) hypertension: Secondary | ICD-10-CM

## 2014-07-01 DIAGNOSIS — J302 Other seasonal allergic rhinitis: Secondary | ICD-10-CM

## 2014-07-01 MED ORDER — FEXOFENADINE HCL 180 MG PO TABS: 180.0000 mg | ORAL_TABLET | Freq: Every day | ORAL | Status: DC

## 2014-07-01 MED ORDER — CALCIUM CARBONATE-VITAMIN D 600-400 MG-UNIT PO TABS: 2.0000 | ORAL_TABLET | Freq: Every day | ORAL | Status: DC

## 2014-07-01 MED ORDER — AMLODIPINE BESYLATE 5 MG PO TABS: 5.0000 mg | ORAL_TABLET | Freq: Every day | ORAL | Status: DC

## 2014-07-01 MED ORDER — OLMESARTAN MEDOXOMIL-HCTZ 20-12.5 MG PO TABS: 1.0000 | ORAL_TABLET | Freq: Every day | ORAL | Status: DC

## 2014-07-01 NOTE — Telephone Encounter (Signed)
Meds were just refilled but patient and pharmacy are requesting a 90 day supply.  It has to be sent in that way. Please resend.

## 2014-07-03 MED ORDER — OLMESARTAN MEDOXOMIL-HCTZ 20-12.5 MG PO TABS: 1.0000 | ORAL_TABLET | Freq: Every day | ORAL | Status: DC

## 2014-07-03 MED ORDER — FEXOFENADINE HCL 180 MG PO TABS: 180.0000 mg | ORAL_TABLET | Freq: Every day | ORAL | Status: DC

## 2014-07-03 MED ORDER — CALCIUM CARBONATE-VITAMIN D 600-400 MG-UNIT PO TABS: 2.0000 | ORAL_TABLET | Freq: Every day | ORAL | Status: DC

## 2014-07-03 MED ORDER — AMLODIPINE BESYLATE 5 MG PO TABS: 5.0000 mg | ORAL_TABLET | Freq: Every day | ORAL | Status: DC

## 2014-07-03 NOTE — Telephone Encounter (Signed)
90 day refills done.

## 2014-07-03 NOTE — Addendum Note (Signed)
Addended by: Wilber Oliphant on: 07/03/2014 03:44 PM   Modules accepted: Orders

## 2014-07-03 NOTE — Telephone Encounter (Signed)
90 days, please

## 2014-07-12 ENCOUNTER — Emergency Department (HOSPITAL_COMMUNITY)
Admission: EM | Admit: 2014-07-12 | Discharge: 2014-07-12 | Disposition: A | Discharge status: Home / Self Care | Payer: Medicare Other | Attending: Emergency Medicine | Admitting: Emergency Medicine

## 2014-07-12 ENCOUNTER — Encounter (HOSPITAL_COMMUNITY): Payer: Self-pay | Admitting: Emergency Medicine

## 2014-07-12 DIAGNOSIS — S61209A Unspecified open wound of unspecified finger without damage to nail, initial encounter: Secondary | ICD-10-CM | POA: Diagnosis not present

## 2014-07-12 DIAGNOSIS — Y9289 Other specified places as the place of occurrence of the external cause: Secondary | ICD-10-CM | POA: Diagnosis not present

## 2014-07-12 DIAGNOSIS — Z8639 Personal history of other endocrine, nutritional and metabolic disease: Secondary | ICD-10-CM | POA: Insufficient documentation

## 2014-07-12 DIAGNOSIS — S61011A Laceration without foreign body of right thumb without damage to nail, initial encounter: Secondary | ICD-10-CM

## 2014-07-12 DIAGNOSIS — Z23 Encounter for immunization: Secondary | ICD-10-CM | POA: Diagnosis not present

## 2014-07-12 DIAGNOSIS — Y93G1 Activity, food preparation and clean up: Secondary | ICD-10-CM | POA: Diagnosis not present

## 2014-07-12 DIAGNOSIS — Z79899 Other long term (current) drug therapy: Secondary | ICD-10-CM | POA: Diagnosis not present

## 2014-07-12 DIAGNOSIS — IMO0002 Reserved for concepts with insufficient information to code with codable children: Secondary | ICD-10-CM | POA: Diagnosis not present

## 2014-07-12 DIAGNOSIS — I10 Essential (primary) hypertension: Secondary | ICD-10-CM | POA: Diagnosis not present

## 2014-07-12 DIAGNOSIS — W268XXA Contact with other sharp object(s), not elsewhere classified, initial encounter: Secondary | ICD-10-CM | POA: Insufficient documentation

## 2014-07-12 DIAGNOSIS — Z862 Personal history of diseases of the blood and blood-forming organs and certain disorders involving the immune mechanism: Secondary | ICD-10-CM | POA: Diagnosis not present

## 2014-07-12 MED ORDER — TETANUS-DIPHTH-ACELL PERTUSSIS 5-2.5-18.5 LF-MCG/0.5 IM SUSP
0.5000 mL | Freq: Once | INTRAMUSCULAR | Status: AC
  Administered 2014-07-12: 0.5 mL via INTRAMUSCULAR
  Filled 2014-07-12: qty 0.5

## 2014-07-12 NOTE — ED Notes (Signed)
Declined W/C at D/C and was escorted to lobby by RN. 

## 2014-07-12 NOTE — ED Provider Notes (Signed)
CSN: [id number]     Arrival date & time 07/12/14  1601 History   First MD Initiated Contact with Patient 07/12/14 1618   This chart was scribed for non-physician practitioner Vivi Barrack, PA-C,  working with Fredia Sorrow, MD by Rosary Lively, ED scribe. This patient was seen in room TR08C/TR08C and the patient's care was started at 4:42 PM.    Chief Complaint  Patient presents with  . Laceration   The history is provided by the patient. No language interpreter was used.    HPI Comments:  Eileen Cameron is a 63 y.o. female who presents to the Emergency Department complaining of a laceration to the region below the right thumb. Pt reports that she was washing dishes and the bottom fell out of a glass and cut her thumb. Pt reports that she is able to move her thumb, but denies any tingling or numbness. Pt states that she did experience a great deal of bleeding, but wrapped thumb injury in a towel and applied pressure. Pt is unsure of her last tetanus shot. Pt denies nausea, vomiting, fever or chills. Pt denies allergies to shell fish or iodine. Pt reports that she has experienced an allergic reaction to ace inhibitors. Pt has HTN, but reports that she is otherwise healthy. All other ROS are negative.  Past Medical History  Diagnosis Date  . Hypertension   . Hyperlipidemia   . Allergy     seasonal allergies, ace inhibitors--angioedema   Past Surgical History  Procedure Laterality Date  . Ectopic pregnancy surgery  1970s  . Tubal ligation    . Right breast abscess drainage    . Tonsillectomy    . Colonoscopy N/A 01/08/2013    Procedure: COLONOSCOPY;  Surgeon: Arta Silence, MD;  Location: [facility];  Service: Endoscopy;  Laterality: N/A;   Family History  Problem Relation Age of Onset  . Heart disease Mother 78  . Diabetes Mother   . Hypertension Sister   . Heart disease Maternal Grandmother    History  Substance Use Topics  . Smoking status: Never Smoker   . Smokeless  tobacco: Not on file  . Alcohol Use: No   OB History   Grav Para Term Preterm Abortions TAB SAB Ect Mult Living                 Review of Systems  Skin: Positive for wound.  All other systems reviewed and are negative.     Allergies  Ace inhibitors  Home Medications   Prior to Admission medications   Medication Sig Start Date End Date Taking? Authorizing Provider  amLODipine (NORVASC) 5 MG tablet Take 1 tablet (5 mg total) by mouth daily. 07/03/14   Wilber Oliphant, MD  Calcium Carbonate-Vitamin D 600-400 MG-UNIT per tablet Take 2 tablets by mouth daily. 07/03/14   Wilber Oliphant, MD  Coenzyme Q10 (CO Q-10) 100 MG CAPS Take 1 tablet by mouth daily.     Historical Provider, MD  esomeprazole (NEXIUM) 20 MG capsule Take 1 capsule (20 mg total) by mouth daily.  03/03/15  Wilber Oliphant, MD  fexofenadine (ALLEGRA ALLERGY) 180 MG tablet Take 1 tablet (180 mg total) by mouth daily. 07/03/14   Wilber Oliphant, MD  fluticasone (FLONASE) 50 MCG/ACT nasal spray Place 2 sprays into both nostrils daily. 12/29/13   Blain Pais, MD  Multiple Vitamins-Minerals (CENTRUM SILVER ADULT 50+ PO) Take by mouth.    Historical Provider, MD  olmesartan-hydrochlorothiazide (BENICAR HCT) 20-12.5 MG  per tablet Take 1 tablet by mouth daily. 07/03/14   Wilber Oliphant, MD  Omega-3 Fatty Acids (FISH OIL) 1000 MG CAPS Take 2 capsules by mouth daily.     Historical Provider, MD  OMEGA-3 KRILL OIL 300 MG CAPS Take 1 capsule by mouth daily.     Historical Provider, MD  Simethicone 125 MG CAPS Take 1 capsule (125 mg total) by mouth daily as needed (for gas). 03/31/14   Wilber Oliphant, MD  sodium chloride (OCEAN) 0.65 % SOLN nasal spray Place 1 spray into both nostrils 4 (four) times daily. 12/29/13   Blain Pais, MD  triamcinolone cream (KENALOG) 0.1 % Apply 1 application topically 2 (two) times daily. 03/31/14   Wilber Oliphant, MD   BP 146/77  Pulse 90  Temp(Src) 98.1 F (36.7 C) (Oral)  Resp  18  Ht 5\' 3"  (1.6 m)  Wt 188 lb (85.276 kg)  BMI 33.31 kg/m2  SpO2 98% Physical Exam  Nursing note and vitals reviewed. Constitutional: She is oriented to person, place, and time. She appears well-developed and well-nourished.  HENT:  Head: Normocephalic and atraumatic.  Eyes: EOM are normal.  Neck: Normal range of motion. Neck supple.  Cardiovascular: Normal rate, regular rhythm and normal heart sounds.   Pulses:      Radial pulses are 2+ on the right side.       Popliteal pulses are 2+ on the left side.  Pulmonary/Chest: Effort normal and breath sounds normal. No respiratory distress. She has no wheezes. She has no rales.  Musculoskeletal: Normal range of motion.       Right hand: She exhibits laceration. She exhibits normal range of motion, no tenderness, no bony tenderness, normal two-point discrimination, normal capillary refill, no deformity and no swelling. Normal sensation noted. Normal strength noted.       Hands: Full opposition, flexion, extension, and abduction of the hand.    Neurological: She is alert and oriented to person, place, and time.  Skin: Skin is warm and dry. No rash noted. No erythema. No pallor.  Psychiatric: She has a normal mood and affect. Her behavior is normal. Judgment and thought content normal.    ED Course  LACERATION REPAIR Date/Time: 07/12/2014 5:47 PM Performed by: Starlyn Skeans A Authorized by: Starlyn Skeans A Consent: Verbal consent obtained. Risks and benefits: risks, benefits and alternatives were discussed Consent given by: patient Patient understanding: patient states understanding of the procedure being performed Patient consent: the patient's understanding of the procedure matches consent given Procedure consent: procedure consent matches procedure scheduled Relevant documents: relevant documents present and verified Test results: test results available and properly labeled Site marked: the operative site was  marked Imaging studies: imaging studies available Patient identity confirmed: verbally with patient Time out: Immediately prior to procedure a "time out" was called to verify the correct patient, procedure, equipment, support staff and site/side marked as required. Body area: upper extremity Location details: right thumb Laceration length: 1 cm Foreign bodies: no foreign bodies Tendon involvement: none Nerve involvement: none Vascular damage: no Anesthesia: local infiltration Local anesthetic: lidocaine 2% without epinephrine Anesthetic total: 4 ml Patient sedated: no Preparation: Patient was prepped and draped in the usual sterile fashion. Irrigation solution: saline Irrigation method: syringe Amount of cleaning: standard Debridement: none Degree of undermining: none Skin closure: 4-0 Prolene Number of sutures: 2 Technique: simple Approximation: close Approximation difficulty: simple Patient tolerance: Patient tolerated the procedure well with no immediate complications.    DIAGNOSTIC STUDIES:  Oxygen Saturation is 98% on RA, normal by my interpretation.  COORDINATION OF CARE: 4:56 PM-Discussed treatment plan which includes stitches to the right thumb to close laceration with pt at bedside and pt agreed to plan.   Labs Review Labs Reviewed - No data to display  Imaging Review No results found.   EKG Interpretation None      MDM   Final diagnoses:  Thumb laceration, right, initial encounter   Patient is a 64 y.o. Female who presents to the ED with right thumb laceration.  Laceration was repaired as seen above.  Patient had tetanus updated here.  Patient was told to follow up in 5 days for suture removal.  Patient is stable for discharge.  Patient to return for signs of infection.  Patient states understanding and agreement at this time.    I personally performed the services described in this documentation, which was scribed in my presence. The recorded information  has been reviewed and is accurate.   Cherylann Parr, PA-C 07/12/14 [phone]

## 2014-07-12 NOTE — ED Notes (Signed)
Pt reports doing dishes and broken dish cut her right thumb, approx 1 inch laceration noted, bleeding controlled. Unknown tetanus.

## 2014-07-12 NOTE — Discharge Instructions (Signed)

## 2014-07-16 NOTE — ED Provider Notes (Signed)
Medical screening examination/treatment/procedure(s) were performed by non-physician practitioner and as supervising physician I was immediately available for consultation/collaboration.   EKG Interpretation None        Mayzie Caughlin, MD 07/16/14 0741 

## 2014-07-17 ENCOUNTER — Ambulatory Visit (INDEPENDENT_AMBULATORY_CARE_PROVIDER_SITE_OTHER): Payer: Medicare Other | Admitting: Internal Medicine

## 2014-07-17 ENCOUNTER — Encounter: Payer: Self-pay | Admitting: Internal Medicine

## 2014-07-17 VITALS — BP 154/68 | HR 75 | Temp 98.1°F | Wt 192.5 lb

## 2014-07-17 DIAGNOSIS — L538 Other specified erythematous conditions: Secondary | ICD-10-CM | POA: Diagnosis not present

## 2014-07-17 DIAGNOSIS — Z23 Encounter for immunization: Secondary | ICD-10-CM | POA: Diagnosis not present

## 2014-07-17 DIAGNOSIS — I10 Essential (primary) hypertension: Secondary | ICD-10-CM | POA: Diagnosis not present

## 2014-07-17 DIAGNOSIS — L304 Erythema intertrigo: Secondary | ICD-10-CM

## 2014-07-17 DIAGNOSIS — Z5189 Encounter for other specified aftercare: Secondary | ICD-10-CM

## 2014-07-17 DIAGNOSIS — Z0000 Encounter for general adult medical examination without abnormal findings: Secondary | ICD-10-CM

## 2014-07-17 DIAGNOSIS — S61011A Laceration without foreign body of right thumb without damage to nail, initial encounter: Secondary | ICD-10-CM | POA: Insufficient documentation

## 2014-07-17 DIAGNOSIS — S61011D Laceration without foreign body of right thumb without damage to nail, subsequent encounter: Secondary | ICD-10-CM

## 2014-07-17 MED ORDER — TRIAMCINOLONE ACETONIDE 0.1 % EX CREA: 1.0000 "application " | TOPICAL_CREAM | Freq: Two times a day (BID) | CUTANEOUS | Status: DC

## 2014-07-17 MED ORDER — AMLODIPINE BESYLATE 5 MG PO TABS: 10.0000 mg | ORAL_TABLET | Freq: Every day | ORAL | Status: DC

## 2014-07-17 NOTE — Assessment & Plan Note (Signed)
She complains of rash that comes and goes in the groin area, is red and pruritic and responds to triamcinolone crease which had been prescribed to her by a Dermatologist in the past.  Rx triamcinolone cream

## 2014-07-17 NOTE — Patient Instructions (Signed)
-  Start taking amlodipine (Norvasc) 10mg  daily for your blood pressure.  -You may apply neosporin to the cut in your right thumb or place a band-aid to protect it but if possible leave it open and dry.  -Let us know if the wound in your thumb turns red, swollen or with purulent discharge.  -Follow up with [place] in 1 week for blood pressure recheck.   Please bring your medicines with you each time you come.   Medicines may be  Eye drops  Herbal   Vitamins  Pills  Seeing these help [place] take care of you.

## 2014-07-17 NOTE — Assessment & Plan Note (Signed)
She received the flu vaccine during this visit.  She is interested in getting the shingles vaccine--will provide Rx for her during her next visit.

## 2014-07-17 NOTE — Progress Notes (Signed)
   Subjective:    Patient ID: Eileen Cameron, female    DOB: [birth year], 63 y.o.   MRN: [id number]  Suture / Staple Removal   Ms. Jackowski is a pleasant 63 year old woman with PMH of HTN who presents for suture removal of her right thumb. On 9/6 she cut her right thumb when the bottom of a glass fell off while she was washing dishes. She was at the ED, was given a tetanus vaccine, and has the 1cm wound/laceration cleaned and approximated with 2 sutures.  She tells me she has been keeping the wound clean and dry and has not noticed purulent discharge, swelling, or erythema. The has TTP if she bumps the are too hard but has intact sensation of the thumb and full ROM.  She also complains of intermittent redness and itching in the groin area along her underwear lining that responds well to triamcinolone cream that was prescribed by her Dermatologist in the past. She requests refills for this cream.     Review of Systems  Constitutional: Negative for fever, chills, diaphoresis, activity change, appetite change, fatigue and unexpected weight change.  Respiratory: Negative for cough and shortness of breath.   Cardiovascular: Negative for chest pain, palpitations and leg swelling.  Skin: Positive for rash and wound.       She has had intermittent rash in the groin area   Neurological: Negative for dizziness, weakness, light-headedness and headaches.  Psychiatric/Behavioral: Negative for agitation.       Objective:   Physical Exam  Nursing note and vitals reviewed. Constitutional: She is oriented to person, place, and time. She appears well-developed and well-nourished. No distress.  Eyes: Conjunctivae are normal.  Cardiovascular: Normal rate and regular rhythm.   Pulmonary/Chest: Effort normal. No respiratory distress.  Genitourinary:  Examination of rash in groin area deferred  Musculoskeletal: Normal range of motion. She exhibits no edema.  Right thumb with 1cm wound at the base with 2  prolene sutures that were removed with no complications. She has good granulation tissue with skin edges well approximated. The wound is clean does not have surrounding edema/erythema or purulent discharge.   Neurological: She is alert and oriented to person, place, and time. Coordination normal.  Skin: Skin is warm and dry. She is not diaphoretic.  Psychiatric: She has a normal mood and affect. Her behavior is normal.          Assessment & Plan:

## 2014-07-17 NOTE — Assessment & Plan Note (Signed)
She cut her right thumb on 9/6 on broken glass while washing dishes. The laceration was sutured in the ED and her tetanus vaccine was updated.   The 2 sutures were removed today with no complications. The wound is healing well with pink granulation tissue and no signs of infection.   Continue monitoring.

## 2014-07-17 NOTE — Assessment & Plan Note (Signed)
BP Readings from Last 3 Encounters:  07/17/14 154/68  07/12/14 146/77  05/19/14 149/79    Lab Results  Component Value Date   NA 141 01/14/2014   K 3.7 01/14/2014   CREATININE 0.69 01/14/2014    Assessment: Blood pressure control:  Not controlled Progress toward BP goal:   Not at goal Comments: She is on Benicar 20-12.5mg  and amlodipine 5mg  daily  Plan: Medications:  Continue benicar, increased amlodipine to 10mg  daily. She has hx of orthostatic hypotension and dizziness but she is not sure if it was amlodipine 10mg  that caused it. Her BP has been elevated for multiple past visits.  Educational resources provided:   Self management tools provided:   Other plans: Patient instructed to go back to Norvasc 5mg  daily (instead of 10mg ) if she becomes dizzy. Follow up in 1 week for BP recheck.

## 2014-07-22 NOTE — Progress Notes (Signed)
Case discussed with Dr. Kennerly soon after the resident saw the patient.  We reviewed the resident's history and exam and pertinent patient test results.  I agree with the assessment, diagnosis, and plan of care documented in the resident's note. 

## 2014-07-23 ENCOUNTER — Ambulatory Visit (INDEPENDENT_AMBULATORY_CARE_PROVIDER_SITE_OTHER): Payer: Medicare Other | Admitting: Internal Medicine

## 2014-07-23 ENCOUNTER — Encounter: Payer: Self-pay | Admitting: Internal Medicine

## 2014-07-23 VITALS — BP 135/73 | HR 77 | Temp 98.2°F | Wt 190.8 lb

## 2014-07-23 DIAGNOSIS — Z5189 Encounter for other specified aftercare: Secondary | ICD-10-CM | POA: Diagnosis not present

## 2014-07-23 DIAGNOSIS — I10 Essential (primary) hypertension: Secondary | ICD-10-CM

## 2014-07-23 DIAGNOSIS — S61209A Unspecified open wound of unspecified finger without damage to nail, initial encounter: Secondary | ICD-10-CM | POA: Diagnosis not present

## 2014-07-23 DIAGNOSIS — J309 Allergic rhinitis, unspecified: Secondary | ICD-10-CM | POA: Diagnosis not present

## 2014-07-23 DIAGNOSIS — J302 Other seasonal allergic rhinitis: Secondary | ICD-10-CM

## 2014-07-23 DIAGNOSIS — S61011D Laceration without foreign body of right thumb without damage to nail, subsequent encounter: Secondary | ICD-10-CM

## 2014-07-23 MED ORDER — AMLODIPINE BESYLATE 10 MG PO TABS: 10.0000 mg | ORAL_TABLET | Freq: Every day | ORAL | Status: DC

## 2014-07-23 NOTE — Assessment & Plan Note (Signed)
She continues to take Allegra and Flonase for it. She will use lozenges to sooth her throat.

## 2014-07-23 NOTE — Assessment & Plan Note (Signed)
BP Readings from Last 3 Encounters:  07/23/14 135/73  07/17/14 154/68  07/12/14 146/77    Lab Results  Component Value Date   NA 141 01/14/2014   K 3.7 01/14/2014   CREATININE 0.69 01/14/2014    Assessment: Blood pressure control:  Controlled Progress toward BP goal:   At goal Comments: She is on benicar 20-12.5mg  daily, and amlodipine 10mg  daily which was started during her last visit.   Plan: Medications:  Continue current medications Educational resources provided:   Self management tools provided:   Other plans: Follow up in 3 months.

## 2014-07-23 NOTE — Progress Notes (Signed)
   Subjective:    Patient ID: Eileen Cameron, female    DOB: 07-21-1951, 63 y.o.   MRN: [id number]  HPI Eileen Cameron is a 63 year old woman with PMH of HTN, presenting for BP recheck.  She denies dizziness, headache, or lightheadedness with the increased dose of Norvasc. Her BP is well controlled today. She has been checking it a home and is in the 130s/80s range.  She has seasonal allergies with itchy watery eyes, "scracthy" throat for which she is taking Allegra.  Her thumb is healing well with no pain or discharge.     Review of Systems  Constitutional: Negative for fever, chills, diaphoresis, activity change, appetite change, fatigue and unexpected weight change.  HENT: Positive for sore throat. Negative for postnasal drip, rhinorrhea and voice change.   Eyes: Positive for itching.  Respiratory: Negative for cough, shortness of breath and wheezing.   Cardiovascular: Negative for chest pain, palpitations and leg swelling.  Gastrointestinal: Negative for abdominal pain.  Genitourinary: Negative for dysuria.  Skin:       Right thumb laceration healing well with pink granulation tissue, no discharge  Neurological: Negative for dizziness and light-headedness.  Psychiatric/Behavioral: Negative for agitation.       Objective:   Physical Exam  Nursing note and vitals reviewed. Constitutional: She is oriented to person, place, and time. She appears well-developed and well-nourished. No distress.  HENT:  Mouth/Throat: No oropharyngeal exudate.  Eyes: Conjunctivae are normal. Right eye exhibits no discharge. Left eye exhibits no discharge.  Cardiovascular: Normal rate.   Pulmonary/Chest: Effort normal. No respiratory distress. She has no wheezes. She has no rales.  Neurological: She is alert and oriented to person, place, and time. Coordination normal.  Skin: Skin is warm and dry. She is not diaphoretic.  Right thumb with 1cm wound at base healing well with no surrounding edema or  erythema.  Psychiatric: She has a normal mood and affect.          Assessment & Plan:

## 2014-07-23 NOTE — Progress Notes (Signed)
Medicine attending: Medical history, presenting problems, physical findings, and medications, reviewed with medical resident Dr. Hayes Ludwig and I concur with her evaluation and management plan. Murriel Hopper, M.D., [place]

## 2014-07-23 NOTE — Patient Instructions (Signed)
-  Your blood pressure looks great today! -Continue taking amlodipine 10mg  daily. This prescription was sent electronically to your pharmacy. -Follow up in 3 months for blood pressure recheck.   Please bring your medicines with you each time you come.   Medicines may be  Eye drops  Herbal   Vitamins  Pills  Seeing these help [place] take care of you.

## 2014-07-23 NOTE — Assessment & Plan Note (Signed)
The wound is healing well with no s/s of infection

## 2014-08-06 ENCOUNTER — Other Ambulatory Visit: Payer: Self-pay | Admitting: Internal Medicine

## 2014-08-06 DIAGNOSIS — Z1231 Encounter for screening mammogram for malignant neoplasm of breast: Secondary | ICD-10-CM

## 2014-09-02 ENCOUNTER — Telehealth: Payer: Self-pay | Admitting: *Deleted

## 2014-09-02 DIAGNOSIS — I10 Essential (primary) hypertension: Secondary | ICD-10-CM

## 2014-09-02 MED ORDER — OLMESARTAN MEDOXOMIL-HCTZ 20-12.5 MG PO TABS: 2.0000 | ORAL_TABLET | Freq: Every day | ORAL | Status: DC

## 2014-09-02 NOTE — Telephone Encounter (Signed)
Pt called and would like you to call her.   On last visit 9/17  Norvasc was increased to 10 mg.  This dose of Norvasc is making her dizzy so pt decreased back to Norvasc 5 mg. She would like you to call her about this. Pt # [id number]

## 2014-09-02 NOTE — Telephone Encounter (Signed)
Blood pressure 165/86 yesterday while on norvasc 5mg  and has been higher than 140/80 whenever she checks recently on amlodipine 5mg  and benicar and usually SBP 150s. She thinks when she was on norvasc 10mg  she was getting dizzy and does not want to go back up to 10mg  as she was told on prior visit with Dr. Hayes Ludwig. She is not dizzy when she is on 5mg  of norvasc with benicar but her BP is not controlled.  She denies any headaches, chest pain, nausea, or vomiting.   Therefore, we will hold norvasc for now, and increase benicar hct 20-12.5mg  to two tablets a day, for total 40-25mg  daily. She is asked to keep checking her blood pressure at home and report the values to me in one week and we can make further adjustments. I have also asked her to move up her appointment with me to 2 weeks instead of late November so we can reassess the blood pressure then. If still having dizziness, will need to assess for orthostatic and perhaps d/c diuretic and consider other options. But for now, would like to maximize monotherapy to see if we can control both BP and symptoms. She is advised that if her blood pressure remains elevated or if she starts having symptoms, headaches, change in vision, chest pain, sob, nausea or vomiting to call us right away.   Signed: Jerene Pitch, MD PGY-3, Internal Medicine Resident Pager: [phone]  09/02/2014,1:47 PM

## 2014-09-10 ENCOUNTER — Ambulatory Visit (HOSPITAL_COMMUNITY)
Admission: RE | Admit: 2014-09-10 | Discharge: 2014-09-10 | Disposition: A | Discharge status: Home / Self Care | Payer: Medicare Other | Source: Ambulatory Visit | Attending: Internal Medicine | Admitting: Internal Medicine

## 2014-09-10 DIAGNOSIS — Z1231 Encounter for screening mammogram for malignant neoplasm of breast: Secondary | ICD-10-CM | POA: Diagnosis not present

## 2014-09-18 ENCOUNTER — Encounter: Payer: Self-pay | Admitting: Internal Medicine

## 2014-09-18 ENCOUNTER — Ambulatory Visit (INDEPENDENT_AMBULATORY_CARE_PROVIDER_SITE_OTHER): Payer: Medicare Other | Admitting: Internal Medicine

## 2014-09-18 VITALS — BP 144/79 | HR 84 | Temp 97.6°F | Ht 63.0 in | Wt 192.1 lb

## 2014-09-18 DIAGNOSIS — Z0000 Encounter for general adult medical examination without abnormal findings: Secondary | ICD-10-CM

## 2014-09-18 DIAGNOSIS — I10 Essential (primary) hypertension: Secondary | ICD-10-CM

## 2014-09-18 DIAGNOSIS — G4700 Insomnia, unspecified: Secondary | ICD-10-CM

## 2014-09-18 DIAGNOSIS — R140 Abdominal distension (gaseous): Secondary | ICD-10-CM | POA: Diagnosis not present

## 2014-09-18 LAB — BASIC METABOLIC PANEL WITH GFR
BUN: 13 mg/dL (ref 6–23)
CHLORIDE: 102 meq/L (ref 96–112)
CO2: 26 meq/L (ref 19–32)
CREATININE: 0.84 mg/dL (ref 0.50–1.10)
Calcium: 9.6 mg/dL (ref 8.4–10.5)
GFR, Est African American: 84 mL/min
GFR, Est Non African American: 73 mL/min
GLUCOSE: 95 mg/dL (ref 70–99)
Potassium: 4.1 mEq/L (ref 3.5–5.3)
Sodium: 139 mEq/L (ref 135–145)

## 2014-09-18 MED ORDER — OLMESARTAN MEDOXOMIL-HCTZ 40-25 MG PO TABS
1.0000 | ORAL_TABLET | Freq: Every day | ORAL | Status: DC
Start: 2014-09-18 — End: 2015-02-02

## 2014-09-18 MED ORDER — CALCIUM CARBONATE-VITAMIN D 600-400 MG-UNIT PO TABS
2.0000 | ORAL_TABLET | Freq: Every day | ORAL | Status: DC
Start: 2014-09-18 — End: 2015-11-18

## 2014-09-18 NOTE — Progress Notes (Signed)
Internal Medicine Clinic Attending  Case discussed with Dr. Qureshi at the time of the visit.  We reviewed the resident's history and exam and pertinent patient test results.  I agree with the assessment, diagnosis, and plan of care documented in the resident's note. 

## 2014-09-18 NOTE — Assessment & Plan Note (Signed)
She is debating shingles vaccination, reviewed side-effects

## 2014-09-18 NOTE — Assessment & Plan Note (Signed)
BP Readings from Last 3 Encounters:  09/18/14 144/79  07/23/14 135/73  07/17/14 154/68   Lab Results  Component Value Date   NA 141 01/14/2014   K 3.7 01/14/2014   CREATININE 0.69 01/14/2014    Assessment: Blood pressure control: controlled Progress toward BP goal:  at goal Comments: per new [facility] guidelines at goal with no hx of DM and CKD and age >82  Plan: Medications:  continue current medications benicar hct 40-25mg  daily Educational resources provided:   Self management tools provided:   Other plans: bmet today, follow up 3 months

## 2014-09-18 NOTE — Patient Instructions (Signed)
General Instructions:  Please bring your medicines with you each time you come to clinic.  Medicines may include prescription medications, over-the-counter medications, herbal remedies, eye drops, vitamins, or other pills.  Great job on your blood pressure. Lets keep you on benicar hct 40-25mg  daily. Follow up with me in ~3 months.   Please drink more water and cut back on salt intake.   Progress Toward Treatment Goals:  Treatment Goal 09/18/2014  Blood pressure at goal    Self Care Goals & Plans:  Self Care Goal 09/18/2014  Manage my medications bring my medications to every visit; take my medicines as prescribed; refill my medications on time  Monitor my health -  Eat healthy foods eat more vegetables; eat foods that are low in salt; eat baked foods instead of fried foods  Be physically active find an activity I enjoy; take a walk every day    No flowsheet data found.   Care Management & Community Referrals:  Referral 01/14/2013  Referrals made for care management support none needed    Bloating Bloating is the feeling of fullness in your belly. You may feel as though your pants are too tight. Often the cause of bloating is overeating, retaining fluids, or having gas in your bowel. It is also caused by swallowing air and eating foods that cause gas. Irritable bowel syndrome is one of the most common causes of bloating. Constipation is also a common cause. Sometimes more serious problems can cause bloating. SYMPTOMS  Usually there is a feeling of fullness, as though your abdomen is bulged out. There may be mild discomfort.  DIAGNOSIS  Usually no particular testing is necessary for most bloating. If the condition persists and seems to become worse, your caregiver may do additional testing.  TREATMENT   There is no direct treatment for bloating.  Do not put gas into the bowel. Avoid chewing gum and sucking on candy. These tend to make you swallow air. Swallowing air can also be  a nervous habit. Try to avoid this.  Avoiding high residue diets will help. Eat foods with soluble fibers (examples include root vegetables, apples, or barley) and substitute dairy products with soy and rice products. This helps irritable bowel syndrome.  If constipation is the cause, then a high residue diet with more fiber will help.  Avoid carbonated beverages.  Over-the-counter preparations are available that help reduce gas. Your pharmacist can help you with this. SEEK MEDICAL CARE IF:   Bloating continues and seems to be getting worse.  You notice a weight gain.  You have a weight loss but the bloating is getting worse.  You have changes in your bowel habits or develop nausea or vomiting. SEEK IMMEDIATE MEDICAL CARE IF:   You develop shortness of breath or swelling in your legs.  You have an increase in abdominal pain or develop chest pain. Document Released: 08/23/2006 Document Revised: 01/15/2012 Document Reviewed: 10/11/2007 [facility] Patient Information 2015 [place], [place]. This information is not intended to replace advice given to you by your health care provider. Make sure you discuss any questions you have with your health care provider.

## 2014-09-18 NOTE — Assessment & Plan Note (Signed)
Acute. No constipation or diarrhea. Passing gas. Admits to high salt diet and adds extra salt and seasoning to food and does not drink a lot of water.   Discussed dietary modifications today. Increase water intake, cut back on salt intake, <2g a day, ~<1tsp of salt a day. She will work on this. Encouraged to buy low sodium ingredients.

## 2014-09-18 NOTE — Progress Notes (Signed)
Subjective:   Patient ID: Eileen Cameron female   DOB: Jul 11, 1951 63 y.o.   MRN: [id number]  HPI: Ms.Anthea Spagnuolo is a 63 y.o. female with HTN presenting to [facility] today for BP follow up visit.   HTN--called me 10/28 in regards to elevated BP, not taking norvasc 10 as prescribed for complaints of dizziness. We decided to come off the norvasc and increase benicar dose to 2 tablets a day for total 40mg  olmesartan and 25mg  hctz. Today, BP is 144/79, which is at goal and improved from her values at home when she called last month 160s/80s with no more dizziness.   Bloating--feeling more bloating than usual. Admits to high salt diet and low water intake. Passing gas, no constipation or diarrhea.   Not sleeping well--admits to snoring a lot, maybe some apneic episodes per her daughter, not feeling rested with not getting a lot sleep.  Past Medical History  Diagnosis Date  . Hypertension   . Hyperlipidemia   . Allergy     seasonal allergies, ace inhibitors--angioedema   Current Outpatient Prescriptions  Medication Sig Dispense Refill  . Calcium Carbonate-Vitamin D 600-400 MG-UNIT per tablet Take 2 tablets by mouth daily. 180 tablet 5  . Coenzyme Q10 (CO Q-10) 100 MG CAPS Take 1 tablet by mouth daily.     Marland Kitchen esomeprazole (NEXIUM) 20 MG capsule Take 1 capsule (20 mg total) by mouth daily. 30 capsule 1  . fexofenadine (ALLEGRA ALLERGY) 180 MG tablet Take 1 tablet (180 mg total) by mouth daily. 90 tablet 3  . fluticasone (FLONASE) 50 MCG/ACT nasal spray Place 2 sprays into both nostrils daily. 16 g 2  . Multiple Vitamins-Minerals (CENTRUM SILVER ADULT 50+ PO) Take by mouth.    . olmesartan-hydrochlorothiazide (BENICAR HCT) 20-12.5 MG per tablet Take 2 tablets by mouth daily. 90 tablet 11  . Omega-3 Fatty Acids (FISH OIL) 1000 MG CAPS Take 2 capsules by mouth daily.     . OMEGA-3 KRILL OIL 300 MG CAPS Take 1 capsule by mouth daily.     . Simethicone 125 MG CAPS Take 1 capsule (125 mg total) by mouth  daily as needed (for gas). 28 each 0  . sodium chloride (OCEAN) 0.65 % SOLN nasal spray Place 1 spray into both nostrils 4 (four) times daily. 15 mL 3  . triamcinolone cream (KENALOG) 0.1 % Apply 1 application topically 2 (two) times daily. 30 g 2   No current facility-administered medications for this visit.   Family History  Problem Relation Age of Onset  . Heart disease Mother 53  . Diabetes Mother   . Hypertension Sister   . Heart disease Maternal Grandmother    History   Social History  . Marital Status: Divorced    Spouse Name: N/A    Number of Children: N/A  . Years of Education: N/A   Social History Main Topics  . Smoking status: Never Smoker   . Smokeless tobacco: None  . Alcohol Use: No  . Drug Use: No  . Sexual Activity: None   Other Topics Concern  . None   Social History Narrative   Review of Systems:  Constitutional:  Denies fever, chills  HEENT:  Denies congestion  Respiratory:  Denies SOB  Cardiovascular:  Denies chest pain  Gastrointestinal:  Denies nausea, vomiting, abdominal pain. Bloating.   Genitourinary:  Denies dysuria  Musculoskeletal:  Denies gait problem.   Skin:  Denies pallor, rash and wound.   Neurological:  Denies headaches.  Objective:  Physical Exam: Filed Vitals:   09/18/14 0922  BP: 144/79  Pulse: 84  Temp: 97.6 F (36.4 C)  TempSrc: Oral  Height: 5\' 3"  (1.6 m)  Weight: 192 lb 1.6 oz (87.136 kg)  SpO2: 99%   Vitals reviewed. General: sitting in chair, NAD HEENT: EOMI Cardiac: RRR Pulm: clear to auscultation bilaterally, no wheezes, rales, or rhonchi Abd: soft, nontender, obese, +ventral hernia, BS present Ext: moving all 4 extremities Neuro: alert and oriented X3, strength and sensation to light touch equal in bilateral upper and lower extremities  Assessment & Plan:  Discussed with Dr. Lynnae January

## 2014-09-18 NOTE — Assessment & Plan Note (Signed)
Reports not sleeping well through the night for years, not feeling rested. Per daughter, has been told she snores a lot and maybe some pauses in snoring at night. ?mild OSA.   Will try to get split night sleep study for evaluation Encouraged sleep hygiene to continue

## 2014-09-29 ENCOUNTER — Ambulatory Visit: Payer: Medicare Other | Admitting: Internal Medicine

## 2014-10-16 ENCOUNTER — Ambulatory Visit (INDEPENDENT_AMBULATORY_CARE_PROVIDER_SITE_OTHER): Payer: Medicare Other | Admitting: Internal Medicine

## 2014-10-16 ENCOUNTER — Encounter: Payer: Self-pay | Admitting: Internal Medicine

## 2014-10-16 VITALS — BP 148/60 | HR 80 | Temp 98.1°F | Ht 63.0 in | Wt 191.7 lb

## 2014-10-16 DIAGNOSIS — L299 Pruritus, unspecified: Secondary | ICD-10-CM | POA: Diagnosis not present

## 2014-10-16 NOTE — Progress Notes (Signed)
Patient ID: Eileen Cameron, female   DOB: 08/20/1951, 63 y.o.   MRN: [id number]  Subjective:   Patient ID: Eileen Cameron female   DOB: [birth year] 63 y.o.   MRN: [id number]  HPI: [name] is a 63 y.o. F w/ PMH HTN presents for breast itching.  She states that her right breast is itching 3 days. She was having itching on her abdomen and legs last week that resolved with steroid cream. Now she is having itching of her right nipple that extends to the skin medial to the nipple. She has no scaling, ulceration, or discharge. Denies any rash or hives. She did recently changed detergents to the All Clear and Free and changed soaps to [place], which are likely not the source of her itching.    Past Medical History  Diagnosis Date  . Hypertension   . Hyperlipidemia   . Allergy     seasonal allergies, ace inhibitors--angioedema   Current Outpatient Prescriptions  Medication Sig Dispense Refill  . Calcium Carbonate-Vitamin D 600-400 MG-UNIT per tablet Take 2 tablets by mouth daily. 180 tablet 5  . esomeprazole (NEXIUM) 20 MG capsule Take 1 capsule (20 mg total) by mouth daily. 30 capsule 1  . fexofenadine (ALLEGRA ALLERGY) 180 MG tablet Take 1 tablet (180 mg total) by mouth daily. 90 tablet 3  . fluticasone (FLONASE) 50 MCG/ACT nasal spray Place 2 sprays into both nostrils daily. 16 g 2  . Multiple Vitamins-Minerals (CENTRUM SILVER ADULT 50+ PO) Take by mouth.    . olmesartan-hydrochlorothiazide (BENICAR HCT) 40-25 MG per tablet Take 1 tablet by mouth daily. 30 tablet 5  . OMEGA-3 KRILL OIL 300 MG CAPS Take 1 capsule by mouth daily.     . Simethicone 125 MG CAPS Take 1 capsule (125 mg total) by mouth daily as needed (for gas). 28 each 0  . sodium chloride (OCEAN) 0.65 % SOLN nasal spray Place 1 spray into both nostrils 4 (four) times daily. 15 mL 3  . triamcinolone cream (KENALOG) 0.1 % Apply 1 application topically 2 (two) times daily. 30 g 2   No current facility-administered medications for  this visit.   Family History  Problem Relation Age of Onset  . Heart disease Mother 41  . Diabetes Mother   . Hypertension Sister   . Heart disease Maternal Grandmother    History   Social History  . Marital Status: Divorced    Spouse Name: N/A    Number of Children: N/A  . Years of Education: N/A   Social History Main Topics  . Smoking status: Never Smoker   . Smokeless tobacco: None  . Alcohol Use: No  . Drug Use: No  . Sexual Activity: None   Other Topics Concern  . None   Social History Narrative   Review of Systems: Constitutional: Denies fever, chills, appetite change, or fatigue.  Respiratory: Denies SOB, DOE, Cardiovascular: Denies chest pain, palpitations, or leg swelling.  Gastrointestinal: Denies nausea, vomiting, abdominal pain, diarrhea, constipation, Genitourinary: Denies dysuria, urgency, frequency Musculoskeletal: Denies pain Skin: Denies rash or wound.  Neurological: Denies dizziness, syncope, or weakness.  Psychiatric/Behavioral: Denies mood changes or confusion.  Objective:  Physical Exam: Filed Vitals:   10/16/14 1415  BP: 148/60  Pulse: 80  Temp: 98.1 F (36.7 C)  TempSrc: Oral  Height: 5\' 3"  (1.6 m)  Weight: 191 lb 11.2 oz (86.955 kg)  SpO2: 99%   Constitutional: Vital signs reviewed.  Patient is a well-developed and well-nourished female in no acute distress  and cooperative with exam. Alert and oriented x3.  Head: Normocephalic and atraumatic Eyes: PERRL, EOMI. No scleral icterus.   Cardiovascular: RRR Pulmonary/Chest: Normal respiratory effort Abdominal: Soft. Non-tender, non-distended, no masses, organomegaly, or guarding present.  Musculoskeletal: No joint deformities. Moves all 4 extremities Neurological: A&O x3, Strength is normal and symmetric bilaterally, cranial nerve II-XII are grossly intact, no focal motor deficit, sensory intact.  Skin: Warm, dry and intact. No rash. Bilateral breasts without skin changes, ulceration,  edema, scaling, color changes, or nipple discharge.  Psychiatric: Normal mood and affect. Speech and behavior is normal.    Assessment & Plan:   Please refer to Problem List based Assessment and Plan

## 2014-10-16 NOTE — Assessment & Plan Note (Addendum)
Itching of R breast and nipple w/o scaling, ulceration, or discharge for 3 days. She previously had itching of the abdomen and thighs that resolved with steroid ointment. She tried using that on her breast w/o improvement in the itching. On exam, both breast appear completely normal. She has no erythema, edema, warmth, excoriations, ulcerations, or scaling of the skin or nipples and has no discharge from the nipples. She had a normal mammogram in Nov '15, so breast malignancy is less likely. No discoloration that is concerning for melanoma.  - Advised to keep skin moisturized using lotion for sensitive skin and to keep clean using soap, like Dove, for sensitive skin.  - Advised to try Benedryl for itching, but to avoid driving after taking 2/2 drowsiness - Advised pt to avoid itching which could introduce bacteria to the area - Pt to call the clinic if the itching does not improve or if it worsens or if she develops scaling or ulceration to the skin/nipple.

## 2014-10-16 NOTE — Patient Instructions (Addendum)
**  Be sure to keep your skin moisturized using lotion for sensitive skin. Avoid lotions and soap with perfume.  Aveeno, Eucerin, or Lubriderm are typically good for sensitive skin. Dove soap is also good for sensitive skin. **Avoid scratching **Try Benadryl for the itching. Be sure not to drive after taking b/d it can make you drowsy. **If the itching does not improve over the next week, or if you develop skin changes, please give the clinic a call.   Pruritus  Pruritus is an itch. There are many different problems that can cause an itch. Dry skin is one of the most common causes of itching. Most cases of itching do not require medical attention.  HOME CARE INSTRUCTIONS  Make sure your skin is moistened on a regular basis. A moisturizer that contains petroleum jelly is best for keeping moisture in your skin. If you develop a rash, you may try the following for relief:   Use corticosteroid cream.  Apply cool compresses to the affected areas.  Bathe with Epsom salts or baking soda in the bathwater.  Soak in colloidal oatmeal baths. These are available at your pharmacy.  Apply baking soda paste to the rash. Stir water into baking soda until it reaches a paste-like consistency.  Use an anti-itch lotion.  Take over-the-counter diphenhydramine medicine by mouth as the instructions direct.  Avoid scratching. Scratching may cause the rash to become infected. If itching is very bad, your caregiver may suggest prescription lotions or creams to lessen your symptoms.  Avoid hot showers, which can make itching worse. A cold shower may help with itching as long as you use a moisturizer after the shower. SEEK MEDICAL CARE IF: The itching does not go away after several days. Document Released: 07/05/2011 Document Revised: 03/09/2014 Document Reviewed: 07/05/2011 [facility] Patient Information 2015 [place], [place]. This information is not intended to replace advice given to you by your health care  provider. Make sure you discuss any questions you have with your health care provider.  General Instructions:   Please bring your medicines with you each time you come to clinic.  Medicines may include prescription medications, over-the-counter medications, herbal remedies, eye drops, vitamins, or other pills.   Progress Toward Treatment Goals:  Treatment Goal 09/18/2014  Blood pressure at goal    Self Care Goals & Plans:  Self Care Goal 09/18/2014  Manage my medications bring my medications to every visit; take my medicines as prescribed; refill my medications on time  Monitor my health -  Eat healthy foods eat more vegetables; eat foods that are low in salt; eat baked foods instead of fried foods  Be physically active find an activity I enjoy; take a walk every day    No flowsheet data found.   Care Management & Community Referrals:  Referral 01/14/2013  Referrals made for care management support none needed

## 2014-10-19 NOTE — Progress Notes (Signed)
Internal Medicine Clinic Attending  Case discussed with Dr. Glenn soon after the resident saw the patient.  We reviewed the resident's history and exam and pertinent patient test results.  I agree with the assessment, diagnosis, and plan of care documented in the resident's note. 

## 2014-11-26 ENCOUNTER — Ambulatory Visit (INDEPENDENT_AMBULATORY_CARE_PROVIDER_SITE_OTHER): Payer: Medicare Other | Admitting: Internal Medicine

## 2014-11-26 ENCOUNTER — Encounter: Payer: Self-pay | Admitting: Internal Medicine

## 2014-11-26 VITALS — BP 150/78 | HR 66 | Temp 98.0°F | Ht 63.0 in | Wt 192.5 lb

## 2014-11-26 DIAGNOSIS — I10 Essential (primary) hypertension: Secondary | ICD-10-CM | POA: Diagnosis not present

## 2014-11-26 DIAGNOSIS — G4700 Insomnia, unspecified: Secondary | ICD-10-CM

## 2014-11-26 DIAGNOSIS — K219 Gastro-esophageal reflux disease without esophagitis: Secondary | ICD-10-CM

## 2014-11-26 MED ORDER — CARVEDILOL 6.25 MG PO TABS: 6.2500 mg | ORAL_TABLET | Freq: Two times a day (BID) | ORAL | Status: DC

## 2014-11-26 MED ORDER — ESOMEPRAZOLE MAGNESIUM 20 MG PO CPDR: 20.0000 mg | DELAYED_RELEASE_CAPSULE | Freq: Every day | ORAL | Status: DC

## 2014-11-26 NOTE — Progress Notes (Signed)
Case discussed with Dr. Rabbani at the time of the visit.  We reviewed the resident's history and exam and pertinent patient test results.  I agree with the assessment, diagnosis, and plan of care documented in the resident's note. 

## 2014-11-26 NOTE — Patient Instructions (Addendum)
-Start taking coreg 6.125 mg twice a day for your blood pressure  -Start taking nexium in the mornings for reflux 1hr or so before you eat, try to avoid the foods listed below and try not to eat before you go to sleep -Take melatonin to help you sleep  -Please follow-up with Dr. Eula Fried in 2 weeks    Food Choices for Gastroesophageal Reflux Disease When you have gastroesophageal reflux disease (GERD), the foods you eat and your eating habits are very important. Choosing the right foods can help ease the discomfort of GERD. WHAT GENERAL GUIDELINES DO I NEED TO FOLLOW?  Choose fruits, vegetables, whole grains, low-fat dairy products, and low-fat meat, fish, and poultry.  Limit fats such as oils, salad dressings, butter, nuts, and avocado.  Keep a food diary to identify foods that cause symptoms.  Avoid foods that cause reflux. These may be different for different people.  Eat frequent small meals instead of three large meals each day.  Eat your meals slowly, in a relaxed setting.  Limit fried foods.  Cook foods using methods other than frying.  Avoid drinking alcohol.  Avoid drinking large amounts of liquids with your meals.  Avoid bending over or lying down until 2-3 hours after eating. WHAT FOODS ARE NOT RECOMMENDED? The following are some foods and drinks that may worsen your symptoms: Vegetables Tomatoes. Tomato juice. Tomato and spaghetti sauce. Chili peppers. Onion and garlic. Horseradish. Fruits Oranges, grapefruit, and lemon (fruit and juice). Meats High-fat meats, fish, and poultry. This includes hot dogs, ribs, ham, sausage, salami, and bacon. Dairy Whole milk and chocolate milk. Sour cream. Cream. Butter. Ice cream. Cream cheese.  Beverages Coffee and tea, with or without caffeine. Carbonated beverages or energy drinks. Condiments Hot sauce. Barbecue sauce.  Sweets/Desserts Chocolate and cocoa. Donuts. Peppermint and spearmint. Fats and Oils High-fat foods,  including [place] fries and potato chips. Other Vinegar. Strong spices, such as black pepper, white pepper, red pepper, cayenne, curry powder, cloves, ginger, and chili powder. The items listed above may not be a complete list of foods and beverages to avoid. Contact your dietitian for more information. Document Released: 10/23/2005 Document Revised: 10/28/2013 Document Reviewed: 08/27/2013 [facility] Patient Information 2015 [place], [place]. This information is not intended to replace advice given to you by your health care provider. Make sure you discuss any questions you have with your health care provider.   Gastroesophageal Reflux Disease, Adult Gastroesophageal reflux disease (GERD) happens when acid from your stomach flows up into the esophagus. When acid comes in contact with the esophagus, the acid causes soreness (inflammation) in the esophagus. Over time, GERD may create small holes (ulcers) in the lining of the esophagus. CAUSES   Increased body weight. This puts pressure on the stomach, making acid rise from the stomach into the esophagus.  Smoking. This increases acid production in the stomach.  Drinking alcohol. This causes decreased pressure in the lower esophageal sphincter (valve or ring of muscle between the esophagus and stomach), allowing acid from the stomach into the esophagus.  Late evening meals and a full stomach. This increases pressure and acid production in the stomach.  A malformed lower esophageal sphincter. Sometimes, no cause is found. SYMPTOMS   Burning pain in the lower part of the mid-chest behind the breastbone and in the mid-stomach area. This may occur twice a week or more often.  Trouble swallowing.  Sore throat.  Dry cough.  Asthma-like symptoms including chest tightness, shortness of breath, or wheezing. DIAGNOSIS  Your caregiver may be able to diagnose GERD based on your symptoms. In some cases, X-rays and other tests may be done to check for  complications or to check the condition of your stomach and esophagus. TREATMENT  Your caregiver may recommend over-the-counter or prescription medicines to help decrease acid production. Ask your caregiver before starting or adding any new medicines.  HOME CARE INSTRUCTIONS   Change the factors that you can control. Ask your caregiver for guidance concerning weight loss, quitting smoking, and alcohol consumption.  Avoid foods and drinks that make your symptoms worse, such as:  Caffeine or alcoholic drinks.  Chocolate.  Peppermint or mint flavorings.  Garlic and onions.  Spicy foods.  Citrus fruits, such as oranges, lemons, or limes.  Tomato-based foods such as sauce, chili, salsa, and pizza.  Fried and fatty foods.  Avoid lying down for the 3 hours prior to your bedtime or prior to taking a nap.  Eat small, frequent meals instead of large meals.  Wear loose-fitting clothing. Do not wear anything tight around your waist that causes pressure on your stomach.  Raise the head of your bed 6 to 8 inches with wood blocks to help you sleep. Extra pillows will not help.  Only take over-the-counter or prescription medicines for pain, discomfort, or fever as directed by your caregiver.  Do not take aspirin, ibuprofen, or other nonsteroidal anti-inflammatory drugs (NSAIDs). SEEK IMMEDIATE MEDICAL CARE IF:   You have pain in your arms, neck, jaw, teeth, or back.  Your pain increases or changes in intensity or duration.  You develop nausea, vomiting, or sweating (diaphoresis).  You develop shortness of breath, or you faint.  Your vomit is green, yellow, black, or looks like coffee grounds or blood.  Your stool is red, bloody, or black. These symptoms could be signs of other problems, such as heart disease, gastric bleeding, or esophageal bleeding. MAKE SURE YOU:   Understand these instructions.  Will watch your condition.  Will get help right away if you are not doing well  or get worse. Document Released: 08/02/2005 Document Revised: 01/15/2012 Document Reviewed: 05/12/2011 [facility] Patient Information 2015 [place], [place]. This information is not intended to replace advice given to you by your health care provider. Make sure you discuss any questions you have with your health care provider.    General Instructions:   Thank you for bringing your medicines today. This helps [place] keep you safe from mistakes.   Progress Toward Treatment Goals:  Treatment Goal 09/18/2014  Blood pressure at goal    Self Care Goals & Plans:  Self Care Goal 11/26/2014  Manage my medications take my medicines as prescribed; bring my medications to every visit; refill my medications on time; follow the sick day instructions if I am sick  Monitor my health keep track of my blood pressure; keep track of my weight  Eat healthy foods eat more vegetables; eat fruit for snacks and desserts; eat baked foods instead of fried foods; eat smaller portions  Be physically active find an activity I enjoy    No flowsheet data found.   Care Management & Community Referrals:  Referral 01/14/2013  Referrals made for care management support none needed

## 2014-11-26 NOTE — Progress Notes (Signed)
Patient ID: Eileen Cameron, female   DOB: [birth year], 64 y.o.   MRN: [id number]    Subjective:   Patient ID: Eileen Cameron female   DOB: Feb 12, 1951 64 y.o.   MRN: [id number]  HPI: [name] is a 64 y.o. woman with past medical history of hypertension, hyperlipidemia, allergic rhinitis, and GERD who presents with chief complaint of of left breast/chest discomfort.   She reports having left breast/chest discomfort and mid-sternal heartburn that began approximately 1 week ago. She describes the pain as sharp and intermittent with possible left arm pain, nausea, and epigastric burning. She denies belching or regurgitation. She reports having history of acid reflex disease and has been noncompliant with taking nexium daily for many months (it was prescribed end of April with 1 refill). She denies eating heavy meals  but does admit to frequently eating chocolate and drinking soda. She repots taking alka-seltzer at the time of her symptoms that helped. She has no history of PUD and denies dysphagia, odynophagia, or melena. She has no prior cardiac history and denies breast lump or skins changes to her breast/nipple. She also denies fever, chills, dyspnea, cough, palpitations, diaphoresis, syncope, or heavy lifting.  She continues to have mild breast/chest discomfort. She was seen on 10/16/14 for right breast pruritis that consequently resolved. She had a normal mammogram November of 2015.   She reports compliance with taking olmesartan-HCTZ for hypertension. She has had angioedema to ACEi in the past and lightheadedness with amlodipine. She denies blurry vision, headache, LE edema, or lightheadedness.   She reports having insomnia with difficulty falling asleep and is taking OTC benadryl every night. She has melatonin at home but has not yet tried it. She is unsure if she snores or has periods of apnea but denies daytime somnolence or napping. She has never had a sleep study in the past.     Past  Medical History  Diagnosis Date  . Hypertension   . Hyperlipidemia   . Allergy     seasonal allergies, ace inhibitors--angioedema   Current Outpatient Prescriptions  Medication Sig Dispense Refill  . Calcium Carbonate-Vitamin D 600-400 MG-UNIT per tablet Take 2 tablets by mouth daily. 180 tablet 5  . esomeprazole (NEXIUM) 20 MG capsule Take 1 capsule (20 mg total) by mouth daily. 30 capsule 1  . fexofenadine (ALLEGRA ALLERGY) 180 MG tablet Take 1 tablet (180 mg total) by mouth daily. 90 tablet 3  . fluticasone (FLONASE) 50 MCG/ACT nasal spray Place 2 sprays into both nostrils daily. 16 g 2  . Multiple Vitamins-Minerals (CENTRUM SILVER ADULT 50+ PO) Take by mouth.    . olmesartan-hydrochlorothiazide (BENICAR HCT) 40-25 MG per tablet Take 1 tablet by mouth daily. 30 tablet 5  . OMEGA-3 KRILL OIL 300 MG CAPS Take 1 capsule by mouth daily.     . Simethicone 125 MG CAPS Take 1 capsule (125 mg total) by mouth daily as needed (for gas). 28 each 0  . sodium chloride (OCEAN) 0.65 % SOLN nasal spray Place 1 spray into both nostrils 4 (four) times daily. 15 mL 3  . triamcinolone cream (KENALOG) 0.1 % Apply 1 application topically 2 (two) times daily. 30 g 2   No current facility-administered medications for this visit.   Family History  Problem Relation Age of Onset  . Heart disease Mother 63  . Diabetes Mother   . Hypertension Sister   . Heart disease Maternal Grandmother    History   Social History  . Marital Status: Divorced  Spouse Name: N/A    Number of Children: N/A  . Years of Education: N/A   Social History Main Topics  . Smoking status: Never Smoker   . Smokeless tobacco: None  . Alcohol Use: No  . Drug Use: No  . Sexual Activity: None   Other Topics Concern  . None   Social History Narrative   Review of Systems: Review of Systems  Constitutional: Negative for fever, chills and diaphoresis.  HENT: Negative for sore throat.        No dysphagia or odynophagia. PND.    Eyes: Negative for blurred vision.  Respiratory: Negative for cough, shortness of breath and wheezing.   Cardiovascular: Negative for chest pain, palpitations and leg swelling.  Gastrointestinal: Positive for heartburn, nausea and abdominal pain (epigastric burning). Negative for vomiting, diarrhea, constipation and blood in stool.  Genitourinary: Negative for dysuria, urgency, frequency and hematuria.  Neurological: Negative for dizziness and headaches.  Psychiatric/Behavioral: The patient has insomnia.     Objective:  Physical Exam: Filed Vitals:   11/26/14 1100  BP: 166/77  Pulse: 75  Temp: 98 F (36.7 C)  TempSrc: Oral  Height: 5\' 3"  (1.6 m)  Weight: 192 lb 8 oz (87.317 kg)  SpO2: 99%    Physical Exam  Constitutional: She is oriented to person, place, and time. She appears well-developed and well-nourished.  HENT:  Head: Normocephalic and atraumatic.  Eyes: EOM are normal.  Neck: Normal range of motion. Neck supple.  Cardiovascular: Normal rate, regular rhythm and normal heart sounds.   No murmur heard. Pulmonary/Chest: Effort normal and breath sounds normal. No respiratory distress. She has no wheezes. She has no rales. She exhibits tenderness (mid-sternal/underneath left breast ).  Breast exam with no lumps or skin changes.  Abdominal: Soft. Bowel sounds are normal. She exhibits no distension. There is tenderness (mild epigastic ). There is no rebound and no guarding.  Musculoskeletal: Normal range of motion. She exhibits no edema or tenderness.  Neurological: She is alert and oriented to person, place, and time.  Skin: Skin is warm and dry. No rash noted. No erythema. No pallor.  Psychiatric: Her behavior is normal. Judgment and thought content normal.  Anxious mood    Assessment & Plan:   Please see problem-list for problem-based assessment and plan

## 2014-11-28 DIAGNOSIS — K219 Gastro-esophageal reflux disease without esophagitis: Secondary | ICD-10-CM | POA: Insufficient documentation

## 2014-11-28 NOTE — Assessment & Plan Note (Addendum)
Assessment: Pt with 1-week duration of heart burn, nausea, and chest discomfort noncompliant with PPI therapy who presents with no alarm symptoms.   Plan:  -Refill Nexium 20 mg daily (pt instructed to take 1-2 hrs before breakfast) -Pt instructed to avoid food triggers and not eat 2-3 hrs before going to sleep (handout on GERD given to patient) -Monitor for alarm symptoms warranting EGD

## 2014-11-28 NOTE — Assessment & Plan Note (Addendum)
Assessment: Pt with moderately well-controlled hypertension compliant with two-class (ARB & diuretic) anti-hypertensive therapy who presents with blood pressure of 166/77 later improved to 150/78.    Plan: -BP 166/77 later checked 150/78 not at goal <140/90   -Prescribe carvedilol 6.25 mg BID (Pt denies h/o asthma, COPD, or DM. Pt was not able to tolerate amlodipine due to lightheadedness in the past) -Continue olmesartan-HCTZ 40-25 mg daily  -Last BMP on 09/18/14 with CKD Stage 2, repeat at next visit -Pt to return in 2 weeks

## 2014-11-28 NOTE — Assessment & Plan Note (Signed)
Assessment: Pt with difficulty falling asleep requiring daily benadryl who presents with persistent insomnia.  Plan:  -Pt to continue sleep hygiene as previously instructed  -Pt instructed to try OTC melatonin instead of nightly benadryl -Consider starting ambien if no response to above   -Consider sleep study to evaluate for OSA

## 2014-12-10 ENCOUNTER — Ambulatory Visit (INDEPENDENT_AMBULATORY_CARE_PROVIDER_SITE_OTHER): Payer: Medicare Other | Admitting: Internal Medicine

## 2014-12-10 ENCOUNTER — Encounter (HOSPITAL_BASED_OUTPATIENT_CLINIC_OR_DEPARTMENT_OTHER): Payer: Medicare Other

## 2014-12-10 ENCOUNTER — Encounter: Payer: Self-pay | Admitting: Internal Medicine

## 2014-12-10 VITALS — BP 138/76 | HR 70 | Temp 98.0°F | Ht 63.0 in | Wt 194.4 lb

## 2014-12-10 DIAGNOSIS — I10 Essential (primary) hypertension: Secondary | ICD-10-CM

## 2014-12-10 DIAGNOSIS — L304 Erythema intertrigo: Secondary | ICD-10-CM

## 2014-12-10 DIAGNOSIS — G4700 Insomnia, unspecified: Secondary | ICD-10-CM

## 2014-12-10 DIAGNOSIS — K219 Gastro-esophageal reflux disease without esophagitis: Secondary | ICD-10-CM

## 2014-12-10 MED ORDER — TRIAMCINOLONE ACETONIDE 0.1 % EX CREA
1.0000 | TOPICAL_CREAM | Freq: Two times a day (BID) | CUTANEOUS | Status: DC
Start: 2014-12-10 — End: 2015-07-22

## 2014-12-10 NOTE — Progress Notes (Signed)
Case discussed with Dr. Qureshi at the time of the visit.  We reviewed the resident's history and exam and pertinent patient test results.  I agree with the assessment, diagnosis, and plan of care documented in the resident's note. 

## 2014-12-10 NOTE — Assessment & Plan Note (Signed)
Heartburn much improved with nexium per patient. Still occasionally has discomfort but getting better. No discomfort today.   -continue nexium

## 2014-12-10 NOTE — Progress Notes (Signed)
Subjective:   Patient ID: Eileen Cameron female   DOB: October 12, 1951 64 y.o.   MRN: [id number]  HPI: [name] is a 64 y.o. female with HTN who presents to opc today for HTN follow up.   HTN--last seen by Dr. Naaman Plummer on 1/23 with noted increased BP. Started on coreg at that time, however, the patient only took one dose and discontinued use due to not liking how it made her feel and the side-effects. She felt like her heart was beating funny. She does not wish to try any other medication at this time unless absolutely necessary. BP today on recheck was much improved despite not taking coreg, 138/76, which is at goal. She will work on her diet and exercise and decrease salt intake as well.   Past Medical History  Diagnosis Date  . Hypertension   . Hyperlipidemia   . Allergy     seasonal allergies, ace inhibitors--angioedema   Current Outpatient Prescriptions  Medication Sig Dispense Refill  . esomeprazole (NEXIUM) 20 MG capsule Take 1 capsule (20 mg total) by mouth daily. 90 capsule 3  . fluticasone (FLONASE) 50 MCG/ACT nasal spray Place 2 sprays into both nostrils daily. 16 g 2  . MELATONIN PO Take by mouth.    . Multiple Vitamins-Minerals (CENTRUM SILVER ADULT 50+ PO) Take by mouth.    . olmesartan-hydrochlorothiazide (BENICAR HCT) 40-25 MG per tablet Take 1 tablet by mouth daily. 30 tablet 5  . OMEGA-3 KRILL OIL 300 MG CAPS Take 1 capsule by mouth daily.     Marland Kitchen triamcinolone cream (KENALOG) 0.1 % Apply 1 application topically 2 (two) times daily. 30 g 2  . Calcium Carbonate-Vitamin D 600-400 MG-UNIT per tablet Take 2 tablets by mouth daily. (Patient not taking: Reported on 12/10/2014) 180 tablet 5  . fexofenadine (ALLEGRA ALLERGY) 180 MG tablet Take 1 tablet (180 mg total) by mouth daily. (Patient not taking: Reported on 12/10/2014) 90 tablet 3   No current facility-administered medications for this visit.   Family History  Problem Relation Age of Onset  . Heart disease Mother 51  .  Diabetes Mother   . Hypertension Sister   . Heart disease Maternal Grandmother    History   Social History  . Marital Status: Divorced    Spouse Name: N/A    Number of Children: N/A  . Years of Education: N/A   Social History Main Topics  . Smoking status: Never Smoker   . Smokeless tobacco: None  . Alcohol Use: No  . Drug Use: No  . Sexual Activity: None   Other Topics Concern  . None   Social History Narrative   Review of Systems:  Constitutional:  Denies fever, chills  HEENT:  Intermittent nasal congestion  Respiratory:  Denies SOB  Cardiovascular:  Denies chest pain  Gastrointestinal:  Denies nausea, vomiting, abdominal pain  Musculoskeletal:  Denies gait problem.   Neurological:  Denies syncope   Objective:  Physical Exam: Filed Vitals:   12/10/14 0950 12/10/14 1010  BP: 153/81 138/76  Pulse: 75 70  Temp: 98 F (36.7 C)   TempSrc: Oral   Height: 5\' 3"  (1.6 m)   Weight: 194 lb 6.4 oz (88.179 kg)   SpO2: 99%    Vitals reviewed. General: sitting in chair, NAD HEENT: EOMI, no sinus tenderness Cardiac: RRR, soft 2/6 systolic murmur, loudest upper sternal borders Pulm: clear to auscultation bilaterally, no wheezes, rales, or rhonchi Abd: soft, BS present Ext: warm and well perfused, no pedal edema,  moving all extremities Neuro: alert and oriented X3, strength and sensation to light touch equal in bilateral upper and lower extremities  Assessment & Plan:  Discussed with Dr. Ellwood Dense D/c coreg

## 2014-12-10 NOTE — Assessment & Plan Note (Signed)
BP Readings from Last 3 Encounters:  12/10/14 138/76  11/26/14 150/78  10/16/14 148/60   Lab Results  Component Value Date   NA 139 09/18/2014   K 4.1 09/18/2014   CREATININE 0.84 09/18/2014   Assessment: Blood pressure control: controlled Progress toward BP goal:  at goal Comments: despite not taking coreg that was started on last visit. She thinks it may have been high due to her not sleeping well. Does not wish to try other medications at this time unless absolutely necessary.   Plan: Medications:  d/c coreg, continue benicar Educational resources provided:   Self management tools provided:   Other plans: BP now at goal, will continue to monitor and work aggressively on lifestyle modifications.I provided literature and counseled on decreasing salt intake and salty foods and exercise even at home which she said she will try to do

## 2014-12-10 NOTE — Assessment & Plan Note (Signed)
Refilled kenalog cream today

## 2014-12-10 NOTE — Patient Instructions (Signed)
General Instructions:  Thank you for bringing your medicines today. This helps [place] keep you safe from mistakes.  You have stopped your coreg, please have your pharmacy dispose of medication  We will go back to your regular bp medications as your bp was back at goal today with the benicar  Please work on salt decrease and exercise at home, read up on DASH diet and the material I gave you today  Please consider getting your shingles vaccine  Progress Toward Treatment Goals:  Treatment Goal 12/10/2014  Blood pressure at goal    Self Care Goals & Plans:  Self Care Goal 12/10/2014  Manage my medications take my medicines as prescribed; bring my medications to every visit; refill my medications on time  Monitor my health keep track of my blood glucose  Eat healthy foods eat more vegetables; eat foods that are low in salt; eat baked foods instead of fried foods  Be physically active take a walk every day    No flowsheet data found.   Care Management & Community Referrals:  Referral 01/14/2013  Referrals made for care management support none needed

## 2014-12-10 NOTE — Assessment & Plan Note (Signed)
Improved with otc melatonin

## 2015-02-02 ENCOUNTER — Other Ambulatory Visit: Payer: Self-pay | Admitting: *Deleted

## 2015-02-02 DIAGNOSIS — I10 Essential (primary) hypertension: Secondary | ICD-10-CM

## 2015-02-02 MED ORDER — OLMESARTAN MEDOXOMIL-HCTZ 40-25 MG PO TABS
1.0000 | ORAL_TABLET | Freq: Every day | ORAL | Status: DC
Start: 2015-02-02 — End: 2015-07-22

## 2015-02-02 NOTE — Telephone Encounter (Signed)
90 day supply please

## 2015-02-10 ENCOUNTER — Encounter: Payer: Self-pay | Admitting: *Deleted

## 2015-03-18 ENCOUNTER — Encounter: Payer: Self-pay | Admitting: *Deleted

## 2015-04-13 ENCOUNTER — Encounter: Payer: Medicare Other | Admitting: Internal Medicine

## 2015-04-20 ENCOUNTER — Encounter: Payer: Self-pay | Admitting: Internal Medicine

## 2015-04-20 ENCOUNTER — Ambulatory Visit (INDEPENDENT_AMBULATORY_CARE_PROVIDER_SITE_OTHER): Payer: Medicare Other | Admitting: Internal Medicine

## 2015-04-20 VITALS — BP 138/72 | HR 77 | Temp 98.0°F | Wt 193.9 lb

## 2015-04-20 DIAGNOSIS — K219 Gastro-esophageal reflux disease without esophagitis: Secondary | ICD-10-CM | POA: Diagnosis not present

## 2015-04-20 DIAGNOSIS — Z6834 Body mass index (BMI) 34.0-34.9, adult: Secondary | ICD-10-CM | POA: Insufficient documentation

## 2015-04-20 DIAGNOSIS — I10 Essential (primary) hypertension: Secondary | ICD-10-CM

## 2015-04-20 DIAGNOSIS — J302 Other seasonal allergic rhinitis: Secondary | ICD-10-CM | POA: Diagnosis not present

## 2015-04-20 DIAGNOSIS — G5712 Meralgia paresthetica, left lower limb: Secondary | ICD-10-CM | POA: Diagnosis not present

## 2015-04-20 DIAGNOSIS — Z0000 Encounter for general adult medical examination without abnormal findings: Secondary | ICD-10-CM | POA: Diagnosis not present

## 2015-04-20 DIAGNOSIS — Z23 Encounter for immunization: Secondary | ICD-10-CM | POA: Diagnosis not present

## 2015-04-20 NOTE — Assessment & Plan Note (Signed)
Reports symptoms resolved and has stopped taking nexium and wishes to stay off of it and monitor.   -monitor off ppi for now

## 2015-04-20 NOTE — Assessment & Plan Note (Signed)
She wishes to lose weight and I congratulated her on her efforts and motivation to do so. She does not exercise very often and skips meals but snacks often. We discussed eating regular balanced meals starting with breakfast if possible to try to prevent a lot of snacking and to also try to exercise more daily, start with trying to walk at least every day and cutting down her daily calories. She is hopeful to lose some weight.   -continue to encourage healthy lifestyle changes, diet modifications and exercise

## 2015-04-20 NOTE — Assessment & Plan Note (Signed)
BP Readings from Last 3 Encounters:  04/20/15 138/72  12/10/14 138/76  11/26/14 150/78   Lab Results  Component Value Date   NA 139 09/18/2014   K 4.1 09/18/2014   CREATININE 0.84 09/18/2014   Assessment: Blood pressure control: controlled Progress toward BP goal:  at goal Comments: initially mildly elevated but improved on manual repeat  Plan: Medications:  continue current medications benicar hct 40-25mg  daily Other plans: continued to educate on lifestyle modifications, increase in exercise, weight loss

## 2015-04-20 NOTE — Progress Notes (Signed)
Subjective:   Patient ID: Eileen Cameron female   DOB: February 27, 1951 64 y.o.   MRN: [id number]  HPI: [name] is a 64 y.o. female with HTN who presents to opc today for routine HTN follow up visit.   HTN--BP initially elevated but improved on repeat. She is compliant with her medications.   She wishes to slowly come off her allergy medications which she will try to do gradually.   Left upper thigh pain, mainly in lateral region, occasional and ongoing for several years and lateral knee pain at times when she lays down. She reports occasional tingling sensation on lateral thigh and admits to weight gain over the years with very little exercise. She has not tried anything for the pain and currently does not have any pain. No trouble walking.   Past Medical History  Diagnosis Date  . Hypertension   . Hyperlipidemia   . Allergy     seasonal allergies, ace inhibitors--angioedema   Current Outpatient Prescriptions  Medication Sig Dispense Refill  . Calcium Carbonate-Vitamin D 600-400 MG-UNIT per tablet Take 2 tablets by mouth daily. (Patient not taking: Reported on 12/10/2014) 180 tablet 5  . esomeprazole (NEXIUM) 20 MG capsule Take 1 capsule (20 mg total) by mouth daily. 90 capsule 3  . fexofenadine (ALLEGRA ALLERGY) 180 MG tablet Take 1 tablet (180 mg total) by mouth daily. (Patient not taking: Reported on 12/10/2014) 90 tablet 3  . fluticasone (FLONASE) 50 MCG/ACT nasal spray Place 2 sprays into both nostrils daily. 16 g 2  . MELATONIN PO Take by mouth.    . Multiple Vitamins-Minerals (CENTRUM SILVER ADULT 50+ PO) Take by mouth.    . olmesartan-hydrochlorothiazide (BENICAR HCT) 40-25 MG per tablet Take 1 tablet by mouth daily. 30 tablet 5  . OMEGA-3 KRILL OIL 300 MG CAPS Take 1 capsule by mouth daily.     Marland Kitchen triamcinolone cream (KENALOG) 0.1 % Apply 1 application topically 2 (two) times daily. 30 g 2   No current facility-administered medications for this visit.   Family History    Problem Relation Age of Onset  . Heart disease Mother 80  . Diabetes Mother   . Hypertension Sister   . Heart disease Maternal Grandmother    History   Social History  . Marital Status: Divorced    Spouse Name: N/A  . Number of Children: N/A  . Years of Education: N/A   Social History Main Topics  . Smoking status: Never Smoker   . Smokeless tobacco: Not on file  . Alcohol Use: No  . Drug Use: No  . Sexual Activity: Not on file   Other Topics Concern  . Not on file   Social History Narrative   Review of Systems:  Constitutional:  Denies fever, chills  HEENT:  Improved nasal congestion  Respiratory:  Denies SOB  Cardiovascular:  Denies leg swelling.   Gastrointestinal:  Denies abdominal pain  Musculoskeletal:  Lateral thigh pain occasionally and lateral left knee pain when she lays down  Skin:  Dry skin on back at times  Neurological:  Occasional headaches none at this time   Objective:  Physical Exam: Filed Vitals:   04/20/15 1321  BP: 149/68  Pulse: 77  Temp: 98 F (36.7 C)  TempSrc: Oral  Weight: 193 lb 14.4 oz (87.952 kg)  SpO2: 98%   Vitals reviewed. General: sitting in chair, NAD Cardiac: RRR, soft SEM heard loudest left upper sternal border Pulm: clear to auscultation bilaterally, no wheezes, rales, or rhonchi  Abd: soft, obese, nontender, nondistended, BS present Ext: warm and well perfused, no pedal edema, moving all extremities, straight leg raise negative, no tenderness to palpation of b/l knees or left lateral thigh, no tenderness to palpation of left hip, buttock or posterior thigh, no crepitus of knees appreciated Neuro: alert and oriented X3, gait stable  Assessment & Plan:  Discussed with Dr. Lynnae January

## 2015-04-20 NOTE — Patient Instructions (Addendum)
General Instructions:  Thank you for bringing your medicines today. This helps [place] keep you safe from mistakes.  Dear Eileen Cameron,  Thank you for coming in today. Try to avoid tight fitting garments and work on weight loss and exercise. Consider going to the [facility] or your local gym and start taking walks every day and cutting down on your calories.   Please follow up with your new doctor  Lets try supportive measures for your thigh and knee discomfort for now, if its worsening or not improving call us back right away to be seen  Progress Toward Treatment Goals:  Treatment Goal 04/20/2015  Blood pressure at goal    Self Care Goals & Plans:  Self Care Goal 12/10/2014  Manage my medications take my medicines as prescribed; bring my medications to every visit; refill my medications on time  Monitor my health keep track of my blood glucose  Eat healthy foods eat more vegetables; eat foods that are low in salt; eat baked foods instead of fried foods  Be physically active take a walk every day    No flowsheet data found.   Care Management & Community Referrals:  Referral 01/14/2013  Referrals made for care management support none needed

## 2015-04-20 NOTE — Assessment & Plan Note (Signed)
She reports getting the zostavax vaccine at [facility] last month--need records prevnar today

## 2015-04-20 NOTE — Assessment & Plan Note (Addendum)
Reports tingling sensation on lateral surface of left thigh for several years and occasionally pain on lateral surface of left knee when lying down. Pain not reproducible on exam today. Straight leg raise neg.   -avoid tight fitting clothing (she wears tights often) -work on weight loss and increase exercise -continue to monitor, if worsening or any weakness, asked to notify us right away and return to clinic for further evaluation -can try tylenol prn for knee pain if persistent, not to exceed 4g in one day

## 2015-04-20 NOTE — Assessment & Plan Note (Addendum)
She switched to zyrtec due to cost and wishes to try to titrate off these medications as she does not feel she needs them as much anymore. We discussed slowly starting to cut back on the medications and let us know if symptoms restart

## 2015-04-23 NOTE — Progress Notes (Signed)
Internal Medicine Clinic Attending  Case discussed with Dr. Qureshi soon after the resident saw the patient.  We reviewed the resident's history and exam and pertinent patient test results.  I agree with the assessment, diagnosis, and plan of care documented in the resident's note. 

## 2015-05-13 ENCOUNTER — Other Ambulatory Visit: Payer: Self-pay | Admitting: Internal Medicine

## 2015-05-18 ENCOUNTER — Other Ambulatory Visit: Payer: Self-pay | Admitting: Internal Medicine

## 2015-05-24 ENCOUNTER — Ambulatory Visit (INDEPENDENT_AMBULATORY_CARE_PROVIDER_SITE_OTHER): Payer: Medicare Other | Admitting: Internal Medicine

## 2015-05-24 VITALS — BP 135/73 | HR 62 | Temp 97.8°F | Ht 63.0 in | Wt 194.1 lb

## 2015-05-24 DIAGNOSIS — R1031 Right lower quadrant pain: Secondary | ICD-10-CM

## 2015-05-24 DIAGNOSIS — G8929 Other chronic pain: Secondary | ICD-10-CM | POA: Insufficient documentation

## 2015-05-24 DIAGNOSIS — R1011 Right upper quadrant pain: Secondary | ICD-10-CM | POA: Diagnosis not present

## 2015-05-24 LAB — COMPLETE METABOLIC PANEL WITH GFR
AST: 17 U/L (ref 0–37)
Albumin: 4.4 g/dL (ref 3.5–5.2)
Alkaline Phosphatase: 100 U/L (ref 39–117)
BUN: 13 mg/dL (ref 6–23)
CO2: 27 mEq/L (ref 19–32)
Calcium: 10 mg/dL (ref 8.4–10.5)
Chloride: 101 mEq/L (ref 96–112)
Creat: 0.88 mg/dL (ref 0.50–1.10)
GFR, EST NON AFRICAN AMERICAN: 69 mL/min
GFR, Est African American: 79 mL/min
GLUCOSE: 94 mg/dL (ref 70–99)
POTASSIUM: 4.1 meq/L (ref 3.5–5.3)
SODIUM: 141 meq/L (ref 135–145)
TOTAL PROTEIN: 7.9 g/dL (ref 6.0–8.3)
Total Bilirubin: 0.5 mg/dL (ref 0.2–1.2)

## 2015-05-24 LAB — CBC
HEMATOCRIT: 43.7 % (ref 36.0–46.0)
Hemoglobin: 14.6 g/dL (ref 12.0–15.0)
MCH: 29.7 pg (ref 26.0–34.0)
MCHC: 33.4 g/dL (ref 30.0–36.0)
MCV: 89 fL (ref 78.0–100.0)
MPV: 10.3 fL (ref 8.6–12.4)
Platelets: 293 10*3/uL (ref 150–400)
RBC: 4.91 MIL/uL (ref 3.87–5.11)
RDW: 13.8 % (ref 11.5–15.5)
WBC: 4.2 10*3/uL (ref 4.0–10.5)

## 2015-05-24 NOTE — Assessment & Plan Note (Addendum)
RUQ pain, came on sharp and suddenly 1 week ago.  Then overnight became less intense.  Now intermittent and not as intense.  No fever or GI symptoms.  No hepatitis, GB hx.  She denies EtOH use.  Does eat baked foods but sometimes fried.  She cannot remember what she had for dinner the night symptoms started but thinks it may have been baked fish.  This has never happened before.  Exam is unremarkable except for mild TTP RUQ.  I can appreciate no HSM but abdominal girth makes deep palpation difficult.  No S&S of acute hepatitis, cholecystitis or gastroenteritis.  No urinary symptoms, vomiting, fever or flank pain to suggest pyelo.  Risk factors for GB dx include her age and obesity.  Possibly biliary colic given initial intense pain that has subsided and now more dull.  - CMP, CBC, RUQ abd [place] - I doubt HCV as there should be other S&S but would recommend testing given her birth cohort [phone]) but patient declines testing today.

## 2015-05-24 NOTE — Progress Notes (Signed)
Subjective:    Patient ID: Eileen Cameron, female    DOB: March 03, 1951, 64 y.o.   MRN: [id number]  HPI Comments: [name] is a 64 year old woman with PMH as below here with c/o abdominal pain.  Please see problem based charting for assessment and plan.   Abdominal Pain This is a new problem. The current episode started in the past 7 days. The onset quality is sudden. The problem occurs intermittently. The most recent episode lasted 7 days. The problem has been waxing and waning. The pain is located in the right flank. Associated symptoms include diarrhea. Pertinent negatives include no anorexia, constipation, dysuria, fever, hematochezia, hematuria, nausea or vomiting. Associated symptoms comments: 2 watery one day last week.. Nothing aggravates the pain. She has tried antacids (Mylanta) for the symptoms. The treatment provided no relief. Her past medical history is significant for GERD. There is no history of abdominal surgery, colon cancer, Crohn's disease, gallstones, irritable bowel syndrome, pancreatitis, PUD or ulcerative colitis.     Past Medical History  Diagnosis Date  . Hypertension   . Hyperlipidemia   . Allergy     seasonal allergies, ace inhibitors--angioedema   Current Outpatient Prescriptions on File Prior to Visit  Medication Sig Dispense Refill  . Calcium Carbonate-Vitamin D 600-400 MG-UNIT per tablet Take 2 tablets by mouth daily. (Patient not taking: Reported on 12/10/2014) 180 tablet 5  . cetirizine (ZYRTEC) 10 MG tablet Take 10 mg by mouth as needed for allergies.    . fluticasone (FLONASE) 50 MCG/ACT nasal spray Place 2 sprays into both nostrils daily. 16 g 2  . MELATONIN PO Take by mouth.    . Multiple Vitamins-Minerals (CENTRUM SILVER ADULT 50+ PO) Take by mouth.    . olmesartan-hydrochlorothiazide (BENICAR HCT) 40-25 MG per tablet Take 1 tablet by mouth daily. 30 tablet 5  . OMEGA-3 KRILL OIL 300 MG CAPS Take 1 capsule by mouth daily.     Marland Kitchen triamcinolone cream  (KENALOG) 0.1 % Apply 1 application topically 2 (two) times daily. 30 g 2  . ZOSTAVAX 57322 UNT/0.65ML injection ADM 0.65ML [place] UTD  0   No current facility-administered medications on file prior to visit.    Review of Systems  Constitutional: Negative for fever, chills and appetite change.  Respiratory: Negative for shortness of breath.   Cardiovascular: Negative for chest pain and palpitations.  Gastrointestinal: Positive for abdominal pain and diarrhea. Negative for nausea, vomiting, constipation, hematochezia and anorexia.       1 day last week she had 2 watery BMs she attributed to something she ate.  This has since resolved.  Genitourinary: Negative for dysuria and hematuria.       Filed Vitals:   05/24/15 0929 05/24/15 1025  BP: 139/57 135/73  Pulse: 64 62  Temp: 97.8 F (36.6 C)   TempSrc: Oral   Height: 5\' 3"  (1.6 m)   Weight: 194 lb 1.6 oz (88.043 kg)   SpO2: 100%      Objective:   Physical Exam  Constitutional: She is oriented to person, place, and time. She appears well-developed. No distress.  Non-toxic appearing.  HENT:  Head: Normocephalic and atraumatic.  Mouth/Throat: Oropharynx is clear and moist. No oropharyngeal exudate.  Eyes: EOM are normal. Pupils are equal, round, and reactive to light.  Neck: Neck supple.  Cardiovascular: Normal rate, regular rhythm and normal heart sounds.  Exam reveals no gallop and no friction rub.   No murmur heard. Pulmonary/Chest: Effort normal and breath sounds normal.  No respiratory distress. She has no wheezes. She has no rales.  Abdominal: Soft. Bowel sounds are normal. She exhibits no distension and no mass. There is tenderness. There is no rebound and no guarding.  Mild RUQ TTP. No CVAT.  Musculoskeletal: Normal range of motion. She exhibits no edema or tenderness.  Neurological: She is alert and oriented to person, place, and time. No cranial nerve deficit.  Skin: Skin is warm. She is not diaphoretic.  Psychiatric:  She has a normal mood and affect. Her behavior is normal. Judgment and thought content normal.  Vitals reviewed.         Assessment & Plan:  Please see problem based charting for assessment and plan.

## 2015-05-24 NOTE — Patient Instructions (Signed)
1. I will call you about your lab results and ultrasound results.  You can try a warm cloth to the area to see if this helps with pain.     2. Please take all medications as prescribed.    3. If you have worsening of your symptoms or new symptoms arise, please call the clinic ([phone]), or go to the ER immediately if symptoms are severe.

## 2015-05-31 ENCOUNTER — Ambulatory Visit (HOSPITAL_COMMUNITY)
Admission: RE | Admit: 2015-05-31 | Discharge: 2015-05-31 | Disposition: A | Discharge status: Home / Self Care | Payer: Medicare Other | Source: Ambulatory Visit | Attending: Internal Medicine | Admitting: Internal Medicine

## 2015-05-31 DIAGNOSIS — K760 Fatty (change of) liver, not elsewhere classified: Secondary | ICD-10-CM | POA: Diagnosis not present

## 2015-05-31 DIAGNOSIS — R1011 Right upper quadrant pain: Secondary | ICD-10-CM

## 2015-05-31 NOTE — Progress Notes (Signed)
Internal Medicine Clinic Attending  Case discussed with Dr. Wilson soon after the resident saw the patient.  We reviewed the resident's history and exam and pertinent patient test results.  I agree with the assessment, diagnosis, and plan of care documented in the resident's note.  

## 2015-07-05 ENCOUNTER — Encounter: Payer: Self-pay | Admitting: Internal Medicine

## 2015-07-05 ENCOUNTER — Ambulatory Visit (INDEPENDENT_AMBULATORY_CARE_PROVIDER_SITE_OTHER): Payer: Medicare Other | Admitting: Internal Medicine

## 2015-07-05 VITALS — BP 154/67 | HR 84 | Temp 97.9°F | Ht 63.0 in | Wt 194.1 lb

## 2015-07-05 DIAGNOSIS — Z23 Encounter for immunization: Secondary | ICD-10-CM

## 2015-07-05 DIAGNOSIS — I10 Essential (primary) hypertension: Secondary | ICD-10-CM | POA: Diagnosis not present

## 2015-07-05 DIAGNOSIS — G8929 Other chronic pain: Secondary | ICD-10-CM | POA: Diagnosis not present

## 2015-07-05 DIAGNOSIS — Z0000 Encounter for general adult medical examination without abnormal findings: Secondary | ICD-10-CM

## 2015-07-05 DIAGNOSIS — R1031 Right lower quadrant pain: Secondary | ICD-10-CM

## 2015-07-05 MED ORDER — DICYCLOMINE HCL 10 MG PO CAPS: 10.0000 mg | ORAL_CAPSULE | Freq: Four times a day (QID) | ORAL | Status: DC | PRN

## 2015-07-05 NOTE — Assessment & Plan Note (Addendum)
Assessment: Pt with chronic RLQ abdominal pain for past 1.5 months and similar symptoms March of 2015 with last colonoscopy on 01/08/14 with few sigmoid diverticuli, RUQ on 05/31/15 with hepatic steatosis, and CT abdomen/pelvis on 01/16/15 with numerous tiny mesenteric lymph nodes with mild mesenteric attenuation who presents with symptoms of unclear etiology, possibly due to chronic mesenteritis vs IBS.   Plan:  -CMP and CBC on 05/24/15 were normal   -Pt unable to provide urine sample, obtain at next visit  -Order CT abdomen and pelvis with contrast in setting of prior findings of numerous tiny mesenteric lymph nodes with mild mesenteric attenuation  -Prescribe trial of bentyl 10 mg QID PRN muscle spasms  -Pt instructed to use OTC imodium PRN diarrhea  -Pt to return for follow-up with PCP on 07/22/15

## 2015-07-05 NOTE — Assessment & Plan Note (Addendum)
Assessment: Pt with moderately well-controlled hypertension compliant with two-class (ARB & diuretic) anti-hypertensive therapy who presents with blood pressure of 157/67.   Plan: -BP 157/67 not at goal <140/90(in setting of CKD) -I had previously prescribed carvedilol 6.25 mg BID however pt never took it, consider restarting at next visit if pt agreeable -Continue olmesartan-HCTZ 40-25 mg daily  -Last CMP on 05/24/15 with stable CKD Stage 2

## 2015-07-05 NOTE — Patient Instructions (Signed)
-Will get a CT scan of your abdomen and pelvis, we will call you to set it up -Start taking bentyl up to 4 times as needed for spasms -Take OTC imodium if needed for diarrhea  -Will give you a flu shot today -Very nice seeing you again!   Irritable Bowel Syndrome Irritable bowel syndrome (IBS) is caused by a disturbance of normal bowel function and is a common digestive disorder. You may also hear this condition called spastic colon, mucous colitis, and irritable colon. There is no cure for IBS. However, symptoms often gradually improve or disappear with a good diet, stress management, and medicine. This condition usually appears in late adolescence or early adulthood. Women develop it twice as often as men. CAUSES  After food has been digested and absorbed in the small intestine, waste material is moved into the large intestine, or colon. In the colon, water and salts are absorbed from the undigested products coming from the small intestine. The remaining residue, or fecal material, is held for elimination. Under normal circumstances, gentle, rhythmic contractions of the bowel walls push the fecal material along the colon toward the rectum. In IBS, however, these contractions are irregular and poorly coordinated. The fecal material is either retained too long, resulting in constipation, or expelled too soon, producing diarrhea. SIGNS AND SYMPTOMS  The most common symptom of IBS is abdominal pain. It is often in the lower left side of the abdomen, but it may occur anywhere in the abdomen. The pain comes from spasms of the bowel muscles happening too much and from the buildup of gas and fecal material in the colon. This pain:  Can range from sharp abdominal cramps to a dull, continuous ache.  Often worsens soon after eating.  Is often relieved by having a bowel movement or passing gas. Abdominal pain is usually accompanied by constipation, but it may also produce diarrhea. The diarrhea often occurs  right after a meal or upon waking up in the morning. The stools are often soft, watery, and flecked with mucus. Other symptoms of IBS include:  Bloating.  Loss of appetite.  Heartburn.  Backache.  Dull pain in the arms or shoulders.  Nausea.  Burping.  Vomiting.  Gas. IBS may also cause symptoms that are unrelated to the digestive system, such as:  Fatigue.  Headaches.  Anxiety.  Shortness of breath.  Trouble concentrating.  Dizziness. These symptoms tend to come and go. DIAGNOSIS  The symptoms of IBS may seem like symptoms of other, more serious digestive disorders. Your health care provider may want to perform tests to exclude these disorders.  TREATMENT Many medicines are available to help correct bowel function or relieve bowel spasms and abdominal pain. Among the medicines available are:  Laxatives for severe constipation and to help restore normal bowel habits.  Specific antidiarrheal medicines to treat severe or lasting diarrhea.  Antispasmodic agents to relieve intestinal cramps. Your health care provider may also decide to treat you with a mild tranquilizer or sedative during unusually stressful periods in your life. Your health care provider may also prescribe antidepressant medicine. The use of this medicine has been shown to reduce pain and other symptoms of IBS. Remember that if any medicine is prescribed for you, you should take it exactly as directed. Make sure your health care provider knows how well it worked for you. HOME CARE INSTRUCTIONS   Take all medicines as directed by your health care provider.  Avoid foods that are high in fat or oils,  such as heavy cream, butter, frankfurters, sausage, and other fatty meats.  Avoid foods that make you go to the bathroom, such as fruit, fruit juice, and dairy products.  Cut out carbonated drinks, chewing gum, and "gassy" foods such as beans and cabbage. This may help relieve bloating and burping.  Eat  foods with bran, and drink plenty of liquids with the bran foods. This helps relieve constipation.  Keep track of what foods seem to bring on your symptoms.  Avoid emotionally charged situations or circumstances that produce anxiety.  Start or continue exercising.  Get plenty of rest and sleep. Document Released: 10/23/2005 Document Revised: 10/28/2013 Document Reviewed: 06/12/2008 [facility] Patient Information 2015 [place], [place]. This information is not intended to replace advice given to you by your health care provider. Make sure you discuss any questions you have with your health care provider.   Mesenteric Adenitis Mesenteric adenitis is an inflammation of lymph nodes (glands) in the abdomen. It may appear to mimic appendicitis symptoms. It is most common in children. The cause of this may be an infection somewhere else in the body. It usually gets well without treatment but can cause problems for up to a couple weeks. SYMPTOMS  The most common problems are:  Fever.  Abdominal pain and tenderness.  Nausea, vomiting, and/or diarrhea. DIAGNOSIS  Your caregiver may have an idea what is wrong by examining you or your child. Sometimes lab work and other studies such as Ultrasonography and a CT scan of the abdomen are done.  TREATMENT  Children with mesenteric adenitis will get well without further treatment. Treatment includes rest, pain medications, and fluids. HOME CARE INSTRUCTIONS   Do not take or give laxatives unless ordered by your caregiver.  Use pain medications as directed.  Follow the diet recommended by your caregiver. SEEK IMMEDIATE MEDICAL CARE IF:   The pain does not go away or becomes severe.  An oral temperature above 102 F (38.9 C) develops.  Repeated vomiting occurs.  The pain becomes localized in the right lower quadrant of the abdomen (possibly appendicitis).  You or your child notice bright red or black tarry stools. MAKE SURE YOU:   Understand  these instructions.  Will watch your condition.  Will get help right away if you are not doing well or get worse. Document Released: 07/27/2006 Document Revised: 01/15/2012 Document Reviewed: 01/28/2014 [facility] Patient Information 2015 [place], [place]. This information is not intended to replace advice given to you by your health care provider. Make sure you discuss any questions you have with your health care provider.    General Instructions:   Please bring your medicines with you each time you come to clinic.  Medicines may include prescription medications, over-the-counter medications, herbal remedies, eye drops, vitamins, or other pills.   Progress Toward Treatment Goals:  Treatment Goal 04/20/2015  Blood pressure at goal    Self Care Goals & Plans:  Self Care Goal 07/05/2015  Manage my medications take my medicines as prescribed; bring my medications to every visit; refill my medications on time; follow the sick day instructions if I am sick  Monitor my health keep track of my blood pressure; keep track of my weight  Eat healthy foods eat more vegetables; eat fruit for snacks and desserts; eat baked foods instead of fried foods; eat foods that are low in salt; eat smaller portions  Be physically active find an activity I enjoy    No flowsheet data found.   Care Management & Community Referrals:  Referral 01/14/2013  Referrals made for care management support none needed

## 2015-07-05 NOTE — Assessment & Plan Note (Signed)
-  Pt received annual influenza vaccination today on 07/05/15  -Obtain screening HCV Ab (born b/w 1947-65) at next visit

## 2015-07-05 NOTE — Progress Notes (Signed)
Patient ID: Eileen Cameron, female   DOB: 1950-12-05, 64 y.o.   MRN: [id number]    Subjective:   Patient ID: Eileen Cameron female   DOB: 03/21/1951 64 y.o.   MRN: [id number]  HPI: Eileen Cameron is a 64 y.o. woman with past medical history of hypertension, hyperlipidemia, allergic rhinitis, and GERD who presents with chief complaint of RLQ abdominal pain.   She reports having initially RUQ abdominal pain that began in early July of this year and was seen on 7/18. Her labwork was unremarkable and RUQ ultrasound of her abdomen revealed hepatic steatosis. Her pain is now located in the RLQ that is "burning, sharp, uncomfortable, and comes and goes" that is not related to food. She denies fever, chills, nausea, vomiting, urinary symptoms, hematuria, cervical discharge, groin pain, decreased appetite, weight loss, melena, or BRBPR. She has been having loose stools with some relief of abdominal pain with defecation. Her last colonoscopy on 01/08/14 revealed few sigmoid diverticuli with repeat recommended in 10 years. She has not taken anything for the pain. She is compliant with taking nexium for GERD. She reports not having similar symptoms before however per chart review was seen on 01/14/14 for similar compliant after which CT abdomen/pelvis was performed which revealed numerous tiny mesenteric lymph nodes with mild mesenteric attenuation possibly caused by mesenteric adenitis vs sclerosing mesenteritis.   She reports compliance with taking olmesartan-HCTZ for hypertension. She has occasional headache but denies blurry vision, LE edema, or lightheadedness.   She would like flu shot today.      Past Medical History  Diagnosis Date  . Hypertension   . Hyperlipidemia   . Allergy     seasonal allergies, ace inhibitors--angioedema   Current Outpatient Prescriptions  Medication Sig Dispense Refill  . Calcium Carbonate-Vitamin D 600-400 MG-UNIT per tablet Take 2 tablets by mouth daily. (Patient not  taking: Reported on 12/10/2014) 180 tablet 5  . cetirizine (ZYRTEC) 10 MG tablet Take 10 mg by mouth as needed for allergies.    . fluticasone (FLONASE) 50 MCG/ACT nasal spray Place 2 sprays into both nostrils daily. 16 g 2  . MELATONIN PO Take by mouth.    . Multiple Vitamins-Minerals (CENTRUM SILVER ADULT 50+ PO) Take by mouth.    . olmesartan-hydrochlorothiazide (BENICAR HCT) 40-25 MG per tablet Take 1 tablet by mouth daily. 30 tablet 5  . OMEGA-3 KRILL OIL 300 MG CAPS Take 1 capsule by mouth daily.     Marland Kitchen triamcinolone cream (KENALOG) 0.1 % Apply 1 application topically 2 (two) times daily. 30 g 2   No current facility-administered medications for this visit.   Family History  Problem Relation Age of Onset  . Heart disease Mother 57  . Diabetes Mother   . Hypertension Sister   . Heart disease Maternal Grandmother    Social History   Social History  . Marital Status: Divorced    Spouse Name: N/A  . Number of Children: N/A  . Years of Education: N/A   Social History Main Topics  . Smoking status: Never Smoker   . Smokeless tobacco: None  . Alcohol Use: No  . Drug Use: No  . Sexual Activity: Not Asked   Other Topics Concern  . None   Social History Narrative   Review of Systems: Review of Systems  Constitutional: Negative for fever, chills and weight loss.  Eyes: Negative for blurred vision.  Respiratory: Negative for cough, shortness of breath and wheezing.   Cardiovascular: Negative for chest pain and leg  swelling.  Gastrointestinal: Positive for heartburn (controlled on PPI), abdominal pain (chronic RLQ ) and diarrhea (loose stools). Negative for nausea, vomiting, constipation, blood in stool and melena.  Genitourinary: Negative for dysuria, urgency, frequency, hematuria and flank pain.  Musculoskeletal: Positive for myalgias and back pain (low ).  Neurological: Positive for headaches (occasionally ). Negative for dizziness.    Objective:  Physical Exam: Filed  Vitals:   07/05/15 1418  BP: 154/67  Pulse: 84  Temp: 97.9 F (36.6 C)  TempSrc: Oral  Height: 5\' 3"  (1.6 m)  Weight: 194 lb 1.6 oz (88.043 kg)  SpO2: 100%    Physical Exam  Constitutional: She is oriented to person, place, and time. She appears well-developed and well-nourished. No distress.  HENT:  Head: Normocephalic and atraumatic.  Right Ear: External ear normal.  Nose: Nose normal.  Mouth/Throat: Oropharynx is clear and moist. No oropharyngeal exudate.  Eyes: Conjunctivae and EOM are normal. Pupils are equal, round, and reactive to light. Right eye exhibits no discharge. Left eye exhibits no discharge. No scleral icterus.  Neck: Normal range of motion. Neck supple.  Cardiovascular: Normal rate, regular rhythm and normal heart sounds.   Pulmonary/Chest: Effort normal and breath sounds normal. No respiratory distress. She has no wheezes. She has no rales.  Abdominal: Soft. Bowel sounds are normal. She exhibits no distension. There is tenderness (mild in RLQ). There is no rebound and no guarding.  Musculoskeletal: Normal range of motion. She exhibits no edema or tenderness.  Neurological: She is alert and oriented to person, place, and time.  Skin: Skin is warm and dry. No rash noted. She is not diaphoretic. No erythema. No pallor.  Psychiatric: She has a normal mood and affect. Her behavior is normal. Judgment and thought content normal.    Assessment & Plan:   Please see problem list for problem-based assessment and plan

## 2015-07-07 NOTE — Progress Notes (Signed)
Internal Medicine Clinic Attending  Case discussed with Dr. Rabbani soon after the resident saw the patient.  We reviewed the resident's history and exam and pertinent patient test results.  I agree with the assessment, diagnosis, and plan of care documented in the resident's note.  

## 2015-07-08 ENCOUNTER — Ambulatory Visit (HOSPITAL_COMMUNITY)
Admission: RE | Admit: 2015-07-08 | Discharge: 2015-07-08 | Disposition: A | Discharge status: Home / Self Care | Payer: Medicare Other | Source: Ambulatory Visit | Attending: Internal Medicine | Admitting: Internal Medicine

## 2015-07-08 DIAGNOSIS — G8929 Other chronic pain: Secondary | ICD-10-CM

## 2015-07-08 DIAGNOSIS — R1031 Right lower quadrant pain: Secondary | ICD-10-CM | POA: Diagnosis not present

## 2015-07-08 DIAGNOSIS — D3502 Benign neoplasm of left adrenal gland: Secondary | ICD-10-CM | POA: Diagnosis not present

## 2015-07-08 DIAGNOSIS — E278 Other specified disorders of adrenal gland: Secondary | ICD-10-CM

## 2015-07-08 DIAGNOSIS — K760 Fatty (change of) liver, not elsewhere classified: Secondary | ICD-10-CM | POA: Insufficient documentation

## 2015-07-08 HISTORY — DX: Other specified disorders of adrenal gland: E27.8

## 2015-07-08 MED ORDER — IOHEXOL 300 MG/ML  SOLN
100.0000 mL | Freq: Once | INTRAMUSCULAR | Status: AC | PRN
  Administered 2015-07-08: 100 mL via INTRAVENOUS

## 2015-07-22 ENCOUNTER — Telehealth: Payer: Self-pay | Admitting: *Deleted

## 2015-07-22 ENCOUNTER — Ambulatory Visit (HOSPITAL_COMMUNITY)
Admission: RE | Admit: 2015-07-22 | Discharge: 2015-07-22 | Disposition: A | Discharge status: Home / Self Care | Payer: Medicare Other | Source: Ambulatory Visit | Attending: Internal Medicine | Admitting: Internal Medicine

## 2015-07-22 ENCOUNTER — Encounter: Payer: Self-pay | Admitting: Internal Medicine

## 2015-07-22 ENCOUNTER — Ambulatory Visit (INDEPENDENT_AMBULATORY_CARE_PROVIDER_SITE_OTHER): Payer: Medicare Other | Admitting: Internal Medicine

## 2015-07-22 VITALS — BP 138/66 | HR 77 | Temp 98.2°F | Ht 63.0 in | Wt 194.3 lb

## 2015-07-22 DIAGNOSIS — R011 Cardiac murmur, unspecified: Secondary | ICD-10-CM

## 2015-07-22 DIAGNOSIS — R079 Chest pain, unspecified: Secondary | ICD-10-CM

## 2015-07-22 DIAGNOSIS — I10 Essential (primary) hypertension: Secondary | ICD-10-CM | POA: Diagnosis not present

## 2015-07-22 DIAGNOSIS — R010 Benign and innocent cardiac murmurs: Secondary | ICD-10-CM | POA: Diagnosis not present

## 2015-07-22 DIAGNOSIS — L304 Erythema intertrigo: Secondary | ICD-10-CM

## 2015-07-22 MED ORDER — METOPROLOL TARTRATE 25 MG PO TABS: 12.5000 mg | ORAL_TABLET | Freq: Every day | ORAL | Status: DC

## 2015-07-22 MED ORDER — NITROGLYCERIN 0.3 MG SL SUBL: 0.3000 mg | SUBLINGUAL_TABLET | SUBLINGUAL | Status: DC | PRN

## 2015-07-22 MED ORDER — OLMESARTAN MEDOXOMIL-HCTZ 40-25 MG PO TABS: 1.0000 | ORAL_TABLET | Freq: Every day | ORAL | Status: DC

## 2015-07-22 MED ORDER — TRIAMCINOLONE ACETONIDE 0.1 % EX CREA: 1.0000 "application " | TOPICAL_CREAM | Freq: Two times a day (BID) | CUTANEOUS | Status: DC

## 2015-07-22 NOTE — Telephone Encounter (Signed)
Call to patient at home and Mobile number.  Message left on Mobile number about 2 D Echo appointment time.  Spoke with patient at home number given appointment for 07/23/2015 at 1:00 PM to arrive by 12:45 PM in Admitting in the [facility].  Patient voiced understanding of.  Sander Nephew, RN 07/22/2015 4:25 PM.

## 2015-07-22 NOTE — Assessment & Plan Note (Addendum)
Patient is tolerating Benicar well. She has been consistently doing her walking exercise regimen, although has had chest discomfort recently (see Chest Pain). Blood pressure is well-controlled today at 138/66.  Plan - Continue exercise regimen - Will add metoprolol 12.5 mg BID for chest pain (see Chest Pain).

## 2015-07-22 NOTE — Progress Notes (Signed)
   Subjective:    Patient ID: Eileen Cameron, female    DOB: 02-10-1951, 64 y.o.   MRN: [id number]  HPI  See problem-based HPI for details.  Review of Systems  Constitutional: Positive for diaphoresis.  HENT: Positive for congestion and postnasal drip. Negative for sinus pressure.   Respiratory: Negative for cough, chest tightness, shortness of breath and wheezing.   Cardiovascular: Negative for chest pain and leg swelling.  Gastrointestinal: Negative for nausea, abdominal pain, constipation and abdominal distention.  Endocrine: Negative for polydipsia and polyuria.  Genitourinary: Negative for frequency.  Musculoskeletal: Negative for neck pain and neck stiffness.  Allergic/Immunologic: Positive for environmental allergies.  Neurological: Negative.   Hematological: Negative.        Objective:   Physical Exam  Constitutional: She appears well-developed. No distress.  Eyes: Conjunctivae and EOM are normal. Pupils are equal, round, and reactive to light.  Neck: No JVD present. No thyromegaly present.  Cardiovascular: Normal rate and regular rhythm.   Murmur heard. 2/6 SEM heard best at left sternal border.  Pulmonary/Chest: Effort normal and breath sounds normal. No respiratory distress. She has no wheezes. She exhibits no tenderness.  Abdominal: Soft. Bowel sounds are normal. She exhibits no distension.  Musculoskeletal: She exhibits no edema.  Lymphadenopathy:    She has no cervical adenopathy.  Neurological: She is alert.  Skin: Skin is warm and dry. She is not diaphoretic.          Assessment & Plan:  See problem-based HPI for details.

## 2015-07-22 NOTE — Patient Instructions (Signed)
Eileen Cameron, it was a pleasure taking care of you today. We are concerned that the "chest tightness" symptoms you're experiencing may be due to your heart. To check this, we are ordering a stress test to see what the cause is. We are also starting a new medication, metoprolol, they may help with the chest tightness. I'm also prescribing a nitroglycerin tablet that you put under your tongue to use ONLY IF you have severe chest pain. This is not a medication to take every day. If you have severe chest pain that continues after 3 tablets, you should go to the emergency room. I'd like to see you back in two weeks to see how you are tolerating the new medication and to see if your symptoms have improved.  Angina Pectoris Angina pectoris, often just called angina, is extreme discomfort in your chest, neck, or arm caused by a lack of blood in the middle and thickest layer of your heart wall (myocardium). It may feel like tightness or heavy pressure. It may feel like a crushing or squeezing pain. Some people say it feels like gas or indigestion. It may go down your shoulders, back, and arms. Some people may have symptoms other than pain. These symptoms include fatigue, shortness of breath, cold sweats, or nausea. There are four different types of angina:  Stable angina--Stable angina usually occurs in episodes of predictable frequency and duration. It usually is brought on by physical activity, emotional stress, or excitement. These are all times when the myocardium needs more oxygen. Stable angina usually lasts a few minutes and often is relieved by taking a medicine that can be taken under your tongue (sublingually). The medicine is called nitroglycerin. Stable angina is caused by a buildup of plaque inside the arteries, which restricts blood flow to the heart muscle (atherosclerosis).  Unstable angina--Unstable angina can occur even when your body experiences little or no physical exertion. It can occur during  sleep. It can also occur at rest. It can suddenly increase in severity or frequency. It might not be relieved by sublingual nitroglycerin. It can last up to 30 minutes. The most common cause of unstable angina is a blood clot that has developed on the top of plaque buildup inside a coronary artery. It can lead to a heart attack if the blood clot completely blocks the artery.  Microvascular angina--This type of angina is caused by a disorder of tiny blood vessels called arterioles. Microvascular angina is more common in women. The pain may be more severe and last longer than other types of angina pectoris.  Prinzmetal or variant angina--This type of angina pectoris usually occurs when your body experiences little or no physical exertion. It especially occurs in the early morning hours. It is caused by a spasm of your coronary artery. HOME CARE INSTRUCTIONS   Only take over-the-counter and prescription medicines as directed by your health care provider.  Stay active or increase your exercise as directed by your health care provider.  Limit strenuous activity as directed by your health care provider.  Limit heavy lifting as directed by your health care provider.  Maintain a healthy weight.  Learn about and eat heart-healthy foods.  Do not use any tobacco products including cigarettes, chewing tobacco or electronic cigarettes. SEEK IMMEDIATE MEDICAL CARE IF:  You experience the following symptoms:  Chest, neck, deep shoulder, or arm pain or discomfort that lasts more than a few minutes.  Chest, neck, deep shoulder, or arm pain or discomfort that goes away and  comes back, repeatedly.  Heavy sweating with discomfort, without a noticeable cause.  Shortness of breath or difficulty breathing.  Angina that does not get better after a few minutes of rest or after taking sublingual nitroglycerin. These can all be symptoms of a heart attack, which is a medical emergency! Get medical help at once.  Call your local emergency service (911 in [place] immediately. Do not  drive yourself to the hospital and do not  wait to for your symptoms to go away. MAKE SURE YOU:  Understand these instructions.  Will watch your condition.  Will get help right away if you are not doing well or get worse. Document Released: 10/23/2005 Document Revised: 10/28/2013 Document Reviewed: 02/24/2014 [facility] Patient Information 2015 [place], [place]. This information is not intended to replace advice given to you by your health care provider. Make sure you discuss any questions you have with your health care provider.

## 2015-07-22 NOTE — Assessment & Plan Note (Addendum)
[name] has been experiencing chest discomfort for the past two days. She first noticed it yesterday morning when she went walking on the track for exercise. She started to get a "funny feeling" in her chest about a half mile into her walk. She said it was in the middle of her chest and was non-radiating. It was accompanied by a profuse sweating, "more than usual" when she exercises. She sat down on a park bench, which improved the chest discomfort. She tried to start walking again, but the chest discomfort returned. She tried walking this morning, but she was experiencing the same symptoms. She said that she was not particularly short of breath. She denied any orthopnea. She is a non-smoker, but has a family history of heart disease. She says that she has not had a recent stress test. She had a lipid panel in 2014 that was unremarkable.   Assessment:  Chest pain is suggestive of typical angina.  Plan:  Given her moderate risk, she qualifies for a stress test. She also has a systolic ejection murmur, so we will obtain an echo to evaluate for aortic stenosis. Depending on the results of a stress test, may initiate aspirin and statin therapy in the future. - Start Metoprolol 12.5 mg BID, will see her again in 2 weeks to see how she is tolerating medication and assess symptoms. Will increase to 25 mg BID if subtherapeutic - Prescribe prn nitroglycerin tablets for severe chest pain. Told her to go to emergency room if 3 tablets do not work.

## 2015-07-22 NOTE — Assessment & Plan Note (Signed)
Has had 2/6 SEM for last two visits. Her current symptoms could be related to aortic stenosis, but she does not clearly endorse any SOB. She denies any orthopnea. - Obtain echocardiogram

## 2015-07-23 ENCOUNTER — Ambulatory Visit (HOSPITAL_COMMUNITY)
Admission: RE | Admit: 2015-07-23 | Discharge: 2015-07-23 | Disposition: A | Discharge status: Home / Self Care | Payer: Medicare Other | Source: Ambulatory Visit | Attending: Internal Medicine | Admitting: Internal Medicine

## 2015-07-23 DIAGNOSIS — R079 Chest pain, unspecified: Secondary | ICD-10-CM

## 2015-07-23 DIAGNOSIS — I517 Cardiomegaly: Secondary | ICD-10-CM | POA: Diagnosis not present

## 2015-07-23 DIAGNOSIS — Z8249 Family history of ischemic heart disease and other diseases of the circulatory system: Secondary | ICD-10-CM | POA: Insufficient documentation

## 2015-07-23 DIAGNOSIS — E785 Hyperlipidemia, unspecified: Secondary | ICD-10-CM | POA: Diagnosis not present

## 2015-07-23 DIAGNOSIS — I10 Essential (primary) hypertension: Secondary | ICD-10-CM | POA: Insufficient documentation

## 2015-07-23 DIAGNOSIS — R011 Cardiac murmur, unspecified: Secondary | ICD-10-CM | POA: Insufficient documentation

## 2015-07-26 NOTE — Progress Notes (Signed)
Internal Medicine Clinic Attending  I saw and evaluated the patient.  I personally confirmed the key portions of the history and exam documented by Dr. Ford and I reviewed pertinent patient test results.  The assessment, diagnosis, and plan were formulated together and I agree with the documentation in the resident's note. 

## 2015-07-26 NOTE — Addendum Note (Signed)
Addended by: Lalla Brothers T on: 07/26/2015 01:36 PM   Modules accepted: Level of Service

## 2015-07-27 ENCOUNTER — Telehealth: Payer: Self-pay | Admitting: Internal Medicine

## 2015-07-27 NOTE — Telephone Encounter (Signed)
Patient calling about results from testing last Thursday.

## 2015-07-30 NOTE — Telephone Encounter (Signed)
I informed Eileen Cameron that the results from her echo were unremarkable, and she was reassured. However, I told her that she should still go to her stress test, even though her chest discomfort with exertion had abated after ceasing drinking 5-6 cups of caffeinated green tea daily. I told her that I would follow-up on the results.

## 2015-08-02 ENCOUNTER — Telehealth (HOSPITAL_COMMUNITY): Payer: Self-pay | Admitting: *Deleted

## 2015-08-02 NOTE — Telephone Encounter (Signed)
Patient given detailed instructions per Myocardial Perfusion Study Information Sheet for test on 08/04/15 at 0900. Patient notified to arrive 15 minutes early and that it is imperative to arrive on time for appointment to keep from having the test rescheduled.  If you need to cancel or reschedule your appointment, please call the office within 24 hours of your appointment. Failure to do so may result in a cancellation of your appointment, and a $50 no show fee. Patient verbalized understanding. Hasspacher, Ranae Palms

## 2015-08-04 ENCOUNTER — Ambulatory Visit (HOSPITAL_COMMUNITY): Payer: Medicare Other | Attending: Internal Medicine

## 2015-08-04 DIAGNOSIS — I10 Essential (primary) hypertension: Secondary | ICD-10-CM | POA: Insufficient documentation

## 2015-08-04 DIAGNOSIS — R079 Chest pain, unspecified: Secondary | ICD-10-CM | POA: Diagnosis not present

## 2015-08-04 LAB — MYOCARDIAL PERFUSION IMAGING
CHL CUP NUCLEAR SDS: 0
CHL CUP NUCLEAR SRS: 0
CHL CUP NUCLEAR SSS: 0
Estimated workload: 8.9 METS
Exercise duration (min): 7 min
Exercise duration (sec): 15 s
LV dias vol: 51 mL
LV sys vol: 5 mL
MPHR: 154 {beats}/min
Peak HR: 153 {beats}/min
Percent HR: 99 %
RATE: 0.37
RPE: 18
Rest HR: 74 {beats}/min
TID: 0.67

## 2015-08-04 MED ORDER — TECHNETIUM TC 99M SESTAMIBI GENERIC - CARDIOLITE
10.1000 | Freq: Once | INTRAVENOUS | Status: AC | PRN
  Administered 2015-08-04: 10.1 via INTRAVENOUS

## 2015-08-04 MED ORDER — TECHNETIUM TC 99M SESTAMIBI GENERIC - CARDIOLITE
31.3000 | Freq: Once | INTRAVENOUS | Status: AC | PRN
  Administered 2015-08-04: 31.3 via INTRAVENOUS

## 2015-08-09 ENCOUNTER — Encounter: Payer: Self-pay | Admitting: Internal Medicine

## 2015-08-09 ENCOUNTER — Ambulatory Visit (INDEPENDENT_AMBULATORY_CARE_PROVIDER_SITE_OTHER): Payer: Medicare Other | Admitting: Internal Medicine

## 2015-08-09 VITALS — BP 117/66 | HR 68 | Temp 98.0°F | Ht 63.0 in | Wt 192.2 lb

## 2015-08-09 DIAGNOSIS — R079 Chest pain, unspecified: Secondary | ICD-10-CM | POA: Diagnosis present

## 2015-08-09 NOTE — Progress Notes (Signed)
   Subjective:    Patient ID: Eileen Cameron, female    DOB: Feb 22, 1951, 64 y.o.   MRN: [id number]  HPI  Eileen Cameron presents for follow-up for chest pain on exertion last week. Please see problem-based assessment and plan for details.  Review of Systems  Constitutional: Negative for activity change, fatigue and unexpected weight change.  HENT: Positive for congestion and postnasal drip.   Eyes: Negative for pain and visual disturbance.  Respiratory: Negative for cough, shortness of breath and wheezing.   Cardiovascular: Negative for chest pain, palpitations and leg swelling.  Gastrointestinal: Negative for abdominal pain and blood in stool.  Genitourinary: Negative for dysuria and difficulty urinating.  Musculoskeletal: Positive for arthralgias.  Skin: Negative for rash.  Allergic/Immunologic: Positive for environmental allergies.  Neurological: Negative for light-headedness and headaches.       Objective:   Physical Exam  Constitutional: She is oriented to person, place, and time. She appears well-developed and well-nourished. No distress.  HENT:  Mouth/Throat: Oropharynx is clear and moist.  Eyes: EOM are normal. Pupils are equal, round, and reactive to light.  Neck: No JVD present.  Cardiovascular: Normal rate, regular rhythm and normal heart sounds.   Pulmonary/Chest: Effort normal and breath sounds normal. No stridor. No respiratory distress. She has no wheezes.  Abdominal: Soft. Bowel sounds are normal. She exhibits no distension.  Musculoskeletal: Normal range of motion. She exhibits no edema.  Lymphadenopathy:    She has no cervical adenopathy.  Neurological: She is alert and oriented to person, place, and time.  Skin: Skin is warm and dry. No rash noted.  Psychiatric: She has a normal mood and affect.          Assessment & Plan:  Please see problem-based assessment and plan for details.

## 2015-08-09 NOTE — Patient Instructions (Signed)
Ms. Golladay, it seems that your "funny feeling" in the chest may have been due to the green tea with caffeine. Please limit your intake of green tea to two cups a day. If you start to feel chest pain or shortness of breath that does not go away, please seek medical attention.   Angina Pectoris Angina pectoris is extreme discomfort in your chest, neck, or arm. Your doctor may call it just angina. It is caused by a lack of oxygen to your heart wall. It may feel like tightness or heavy pressure. It may feel like a crushing or squeezing pain. Some people say it feels like gas. It may go down your shoulders, back, and arms. Some people have symptoms other than pain. These include:  Tiredness.  Shortness of breath.  Cold sweats.  Feeling sick to your stomach (nausea). There are four types of angina:  Stable angina. This type often lasts the same amount of time each time it happens. Activity, stress, or excitement can bring it on. It often gets better after taking a medicine called nitroglycerin. This goes under your tongue.  Unstable angina. This type can happen when you are not active or even during sleep. It can suddenly get worse or happen more often. It may not get better after taking the special medicine. It can last up to 30 minutes.  Microvascular angina. This type is more common in women. It may be more severe or last longer than other types.  Prinzmetal angina. This type often happens when you are not active or in the early morning hours. HOME CARE   Only take medicines as told by your doctor.  Stay active or exercise more as told by your doctor.  Limit very hard activity as told by your doctor.  Limit heavy lifting as told by your doctor.  Keep a healthy weight.  Learn about and eat foods that are healthy for your heart.  Do not use any tobacco such as cigarettes, chewing tobacco, or e-cigarettes. GET HELP RIGHT AWAY IF:   You have chest, neck, deep shoulder, or arm pain or  discomfort that lasts more than a few minutes.  You have chest, neck, deep shoulder, or arm pain or discomfort that goes away and comes back over and over again.  You have heavy sweating that seems to happen for no reason.  You have shortness of breath or trouble breathing.  Your angina does not get better after a few minutes of rest.  Your angina does not get better after you take nitroglycerin medicine. These can all be symptoms of a heart attack. Get help right away. Call your local emergency service (911 in [place] Do not  drive yourself to the hospital. Do not  wait to for your symptoms to go away. MAKE SURE YOU:   Understand these instructions.  Will watch your condition.  Will get help right away if you are not doing well or get worse. Document Released: 04/10/2008 Document Revised: 10/28/2013 Document Reviewed: 02/24/2014 [facility] Patient Information 2015 [place], [place]. This information is not intended to replace advice given to you by your health care provider. Make sure you discuss any questions you have with your health care provider.

## 2015-08-09 NOTE — Assessment & Plan Note (Signed)
Eileen Cameron continues to exercise, walking around the track, and she reports that he chest pain has not returned. She reports that she had been drinking 6-8 cups of caffeinated green tea the time, and has since stopped. She said she did not pick up either the metoprolol or nitroglycerine that were prescribed. A TTE only showed a grade I diastolic dysfunction, and stress test demonstrated a low-risk study.  - Advised to limit intake of caffeine - Discontinued metoprolol - Encouraged continued exercise - Told to seek medical attention if chest pain returns.

## 2015-08-13 ENCOUNTER — Other Ambulatory Visit: Payer: Self-pay | Admitting: Internal Medicine

## 2015-08-13 NOTE — Progress Notes (Signed)
Internal Medicine Clinic Attending  I saw and evaluated the patient.  I personally confirmed the key portions of the history and exam documented by Dr. Ford and I reviewed pertinent patient test results.  The assessment, diagnosis, and plan were formulated together and I agree with the documentation in the resident's note. 

## 2015-08-13 NOTE — Telephone Encounter (Signed)
Pt called requesting Benicar HCT to be filled @ [facility] on [address].

## 2015-08-13 NOTE — Telephone Encounter (Signed)
Called pharm, ready for pick up, called pt informed her

## 2015-09-26 ENCOUNTER — Emergency Department (HOSPITAL_COMMUNITY): Payer: Medicare Other

## 2015-09-26 ENCOUNTER — Encounter (HOSPITAL_COMMUNITY): Payer: Self-pay | Admitting: Emergency Medicine

## 2015-09-26 ENCOUNTER — Emergency Department (HOSPITAL_COMMUNITY)
Admission: EM | Admit: 2015-09-26 | Discharge: 2015-09-26 | Disposition: A | Discharge status: Home / Self Care | Payer: Medicare Other | Attending: Emergency Medicine | Admitting: Emergency Medicine

## 2015-09-26 DIAGNOSIS — Z7952 Long term (current) use of systemic steroids: Secondary | ICD-10-CM | POA: Insufficient documentation

## 2015-09-26 DIAGNOSIS — E785 Hyperlipidemia, unspecified: Secondary | ICD-10-CM | POA: Diagnosis not present

## 2015-09-26 DIAGNOSIS — I10 Essential (primary) hypertension: Secondary | ICD-10-CM | POA: Diagnosis not present

## 2015-09-26 DIAGNOSIS — R519 Headache, unspecified: Secondary | ICD-10-CM

## 2015-09-26 DIAGNOSIS — R51 Headache: Secondary | ICD-10-CM | POA: Insufficient documentation

## 2015-09-26 DIAGNOSIS — Z79899 Other long term (current) drug therapy: Secondary | ICD-10-CM | POA: Insufficient documentation

## 2015-09-26 MED ORDER — PROCHLORPERAZINE MALEATE 5 MG PO TABS
5.0000 mg | ORAL_TABLET | Freq: Once | ORAL | Status: AC
  Administered 2015-09-26: 5 mg via ORAL
  Filled 2015-09-26: qty 1

## 2015-09-26 MED ORDER — PROCHLORPERAZINE MALEATE 5 MG PO TABS: 5.0000 mg | ORAL_TABLET | Freq: Four times a day (QID) | ORAL | Status: DC | PRN

## 2015-09-26 NOTE — ED Notes (Signed)
Pt A&OX4, ambulatory at d/c with steady gait, NAD and states she has all of her belongings with her at d/c 

## 2015-09-26 NOTE — ED Provider Notes (Signed)
CSN: [id number]     Arrival date & time 09/26/15  1911 History   First MD Initiated Contact with Patient 09/26/15 1954     Chief Complaint  Patient presents with  . Headache    HPI  Eileen Cameron is an 64 y.o. female with history of HTN who presents to the ED for evaluation for headache. States she was in her usual state of health until yesterday when she noticed a throbbing frontal headache. States she took Advil PM which provided complete relief. States that this AM the headache came back, this time with no relief from Advil. She describes the headache as throbbing, in her frontal area with some radiation to the crown of her head. She endorses phonophobia but denies photophobia. Denies N/V, chest pain, SOB, fever, chills. She does endorse feeling dizzy and lightheaded. States she does not have a history of migraines and does not usually have headaches. She states that when she has had headaches in the past they have not felt like this. She rates the pain an 8/10 currently.  Past Medical History  Diagnosis Date  . Hypertension   . Hyperlipidemia   . Allergy     seasonal allergies, ace inhibitors--angioedema   Past Surgical History  Procedure Laterality Date  . Ectopic pregnancy surgery  1970s  . Tubal ligation    . Right breast abscess drainage    . Tonsillectomy    . Colonoscopy N/A 01/08/2013    Procedure: COLONOSCOPY;  Surgeon: Arta Silence, MD;  Location: [facility];  Service: Endoscopy;  Laterality: N/A;   Family History  Problem Relation Age of Onset  . Heart disease Mother 86  . Diabetes Mother   . Hypertension Sister   . Heart disease Maternal Grandmother    Social History  Substance Use Topics  . Smoking status: Never Smoker   . Smokeless tobacco: None  . Alcohol Use: No   OB History    No data available     Review of Systems  All other systems reviewed and are negative.     Allergies  Ace inhibitors  Home Medications   Prior to Admission medications    Medication Sig Start Date End Date Taking? Authorizing Provider  Calcium Carbonate-Vitamin D 600-400 MG-UNIT per tablet Take 2 tablets by mouth daily. Patient not taking: Reported on 12/10/2014 09/18/14   Wilber Oliphant, MD  cetirizine (ZYRTEC) 10 MG tablet Take 10 mg by mouth as needed for allergies.    Historical Provider, MD  dicyclomine (BENTYL) 10 MG capsule Take 1 capsule (10 mg total) by mouth 4 (four) times daily as needed for spasms. 07/05/15 07/19/15  Juluis Mire, MD  fluticasone (FLONASE) 50 MCG/ACT nasal spray Place 2 sprays into both nostrils daily. 12/29/13   Blain Pais, MD  MELATONIN PO Take by mouth.    Historical Provider, MD  Multiple Vitamins-Minerals (CENTRUM SILVER ADULT 50+ PO) Take by mouth.    Historical Provider, MD  nitroGLYCERIN (NITROSTAT) 0.3 MG SL tablet Place 1 tablet (0.3 mg total) under the tongue every 5 (five) minutes as needed for chest pain (Take up to 3. If chest pain continues, go to ED.). Take up to 3. If chest pain continues, go to Emergency Room. Patient not taking: Reported on 08/09/2015 07/22/15 07/21/16  Liberty Handy, MD  olmesartan-hydrochlorothiazide (BENICAR HCT) 40-25 MG per tablet Take 1 tablet by mouth daily. 07/22/15   Liberty Handy, MD  OMEGA-3 KRILL OIL 300 MG CAPS Take 1 capsule by mouth daily.  Historical Provider, MD  triamcinolone cream (KENALOG) 0.1 % Apply 1 application topically 2 (two) times daily. 07/22/15   Liberty Handy, MD   BP 130/77 mmHg  Pulse 66  Temp(Src) 97.9 F (36.6 C) (Oral)  Resp 18  Ht 5\' 3"  (1.6 m)  Wt 196 lb (88.905 kg)  BMI 34.73 kg/m2  SpO2 97% Physical Exam  Constitutional: She is oriented to person, place, and time. She does not appear ill. No distress.  HENT:  Head: Normocephalic and atraumatic.  Right Ear: External ear normal.  Left Ear: External ear normal.  Nose: Nose normal.  Mouth/Throat: Oropharynx is clear and moist. No oropharyngeal exudate.  Eyes: Conjunctivae and EOM are normal. Pupils are  equal, round, and reactive to light.  Neck: Normal range of motion. Neck supple.  Cardiovascular: Normal rate, regular rhythm, normal heart sounds and intact distal pulses.   Pulmonary/Chest: Effort normal and breath sounds normal. No respiratory distress. She exhibits no tenderness.  Abdominal: Soft. Bowel sounds are normal. She exhibits no distension. There is no tenderness.  Musculoskeletal: Normal range of motion. She exhibits no edema or tenderness.  Neurological: She is alert and oriented to person, place, and time. She has normal strength. No cranial nerve deficit or sensory deficit. She displays a negative Romberg sign. Coordination and gait normal.  Skin: Skin is warm and dry. She is not diaphoretic.  Psychiatric: She has a normal mood and affect.    ED Course  Procedures (including critical care time) Labs Review Labs Reviewed - No data to display  Imaging Review Ct Head Wo Contrast  09/26/2015  CLINICAL DATA:  SINUS PRESSURE AND HEADACHE SINCE THIS AM PMH: Hyperlipidemia; Allergy, HTN EXAM: CT HEAD WITHOUT CONTRAST TECHNIQUE: Contiguous axial images were obtained from the base of the skull through the vertex without intravenous contrast. COMPARISON:  None. FINDINGS: Sinuses/Soft tissues: Clear paranasal sinuses and mastoid air cells. Intracranial: No mass lesion, hemorrhage, hydrocephalus, acute infarct, intra-axial, or extra-axial fluid collection. IMPRESSION: Normal head CT. Electronically Signed   By: Abigail Miyamoto M.D.   On: 09/26/2015 21:15   I have personally reviewed and evaluated these images and lab results as part of my medical decision-making.   EKG Interpretation None      MDM   Final diagnoses:  Acute nonintractable headache, unspecified headache type    Pt has unremarkable vital signs and nonfocal exam. Neuro exam completely intact. I have low suspicion for intracranial bleed, mass, or other acute pathology. However, given pt's age and new headache that is  different than past headaches, will get CT head. Will give compazine here.   CT is negative. Pt reports resolution of HA with compazine. Will give rx for compazine prn at home. Return precautions given. Pt stable for discharge.   Anne Ng, PA-C 09/27/15 [id number]  Wandra Arthurs, MD 09/28/15 [phone]

## 2015-09-26 NOTE — ED Notes (Signed)
C/o headache yesterday that was relieved with 1 Aleve Sinus.  Reports headache since waking up this morning.  Took 2 Aleve Sinus today without relief.  Denies any other symptoms.  No neuro deficits.  Denies dizziness.

## 2015-09-26 NOTE — Discharge Instructions (Signed)
Your CT scan today was normal. You may take compazine 5mg  as needed for bad headaches. You may alternatively take tylenol over the counter. Please see your primary care provider within one week for follow-up. Return to the ER for any new or worsening symptoms.    Please obtain all of your results from medical records or have your doctors office obtain the results - share them with your doctor - you should be seen at your doctors office in the next 2 days. Call today to arrange your follow up. Take the medications as prescribed. Please review all of the medicines and only take them if you do not have an allergy to them. Please be aware that if you are taking birth control pills, taking other prescriptions, ESPECIALLY ANTIBIOTICS may make the birth control ineffective - if this is the case, either do not engage in sexual activity or use alternative methods of birth control such as condoms until you have finished the medicine and your family doctor says it is OK to restart them. If you are on a blood thinner such as COUMADIN, be aware that any other medicine that you take may cause the coumadin to either work too much, or not enough - you should have your coumadin level rechecked in next 7 days if this is the case.  ?  It is also a possibility that you have an allergic reaction to any of the medicines that you have been prescribed - Everybody reacts differently to medications and while MOST people have no trouble with most medicines, you may have a reaction such as nausea, vomiting, rash, swelling, shortness of breath. If this is the case, please stop taking the medicine immediately and contact your physician.  ?  You should return to the ER if you develop severe or worsening symptoms.

## 2015-11-18 ENCOUNTER — Encounter: Payer: Self-pay | Admitting: Internal Medicine

## 2015-11-18 ENCOUNTER — Ambulatory Visit (INDEPENDENT_AMBULATORY_CARE_PROVIDER_SITE_OTHER): Payer: Medicare Other | Admitting: Internal Medicine

## 2015-11-18 VITALS — BP 130/66 | HR 87 | Temp 97.8°F | Ht 63.0 in | Wt 195.0 lb

## 2015-11-18 DIAGNOSIS — I10 Essential (primary) hypertension: Secondary | ICD-10-CM

## 2015-11-18 DIAGNOSIS — R252 Cramp and spasm: Secondary | ICD-10-CM | POA: Diagnosis not present

## 2015-11-18 DIAGNOSIS — L304 Erythema intertrigo: Secondary | ICD-10-CM | POA: Diagnosis not present

## 2015-11-18 DIAGNOSIS — Z79899 Other long term (current) drug therapy: Secondary | ICD-10-CM

## 2015-11-18 HISTORY — DX: Cramp and spasm: R25.2

## 2015-11-18 MED ORDER — DIAZEPAM 5 MG PO TABS: 5.0000 mg | ORAL_TABLET | Freq: Every evening | ORAL | Status: DC | PRN

## 2015-11-18 MED ORDER — TRIAMCINOLONE ACETONIDE 0.1 % EX CREA: 1.0000 "application " | TOPICAL_CREAM | Freq: Two times a day (BID) | CUTANEOUS | Status: DC | PRN

## 2015-11-18 MED ORDER — OLMESARTAN MEDOXOMIL-HCTZ 40-25 MG PO TABS: 1.0000 | ORAL_TABLET | Freq: Every day | ORAL | Status: DC

## 2015-11-18 NOTE — Progress Notes (Addendum)
   Subjective:    Patient ID: Eileen Cameron, female    DOB: 01-12-1951, 65 y.o.   MRN: [id number]  HPI  Eileen Cameron came to the clinic today to discuss cramps she's been getting in her hands and legs at night, her HTN, and intertrigo. She said that her cramping has been going for years, but it is bothering her more recently. She cannot identify and aggravating or alleviating factors. It is not worsened by increased activity in the day. She says she can have up to one really bad episode a week. She reports a well-balanced diet and drinks plenty of water. She reports that she has her blood pressure has been well-controlled at home, and she is tolerating her medication. She also reports a rash that occurs no more than once a month between her leg and groin that steroid cream treats effectively. She has had no more episodes of chest pain that she described to me in the fall of 2016.   Review of Systems  Constitutional: Negative for fatigue.  Respiratory: Negative for cough and shortness of breath.   Cardiovascular: Negative for chest pain.  Gastrointestinal: Negative for nausea, diarrhea and constipation.  Genitourinary: Negative for urgency and decreased urine volume.  Musculoskeletal: Positive for arthralgias. Negative for joint swelling.       Leg and hand cramping  Skin:       Reports an occasional rash but none today  Neurological: Negative for dizziness.  Psychiatric/Behavioral: Negative for dysphoric mood.       Objective:   Physical Exam  Constitutional: She appears well-developed and well-nourished. No distress.  HENT:  Mouth/Throat: Oropharynx is clear and moist. No oropharyngeal exudate.  Eyes: EOM are normal. No scleral icterus.  Cardiovascular: Normal rate, regular rhythm and normal heart sounds.   Pulmonary/Chest: Effort normal and breath sounds normal. No respiratory distress.  Abdominal: Soft. Bowel sounds are normal. She exhibits no distension.  Skin: Skin is warm and dry.  No rash noted.  Psychiatric: She has a normal mood and affect. Her behavior is normal.  Vitals reviewed.         Assessment & Plan:   Please see problem-based assessment and plan for details.

## 2015-11-18 NOTE — Patient Instructions (Addendum)
Eileen Cameron, great to see today. For your leg cramps, I am prescribing 15 pills of valium to take only when your cramps are severe at night. You do not have to take the melatonin on the nights you take valium. Please follow-up shortly to obtain mammogram.

## 2015-11-18 NOTE — Progress Notes (Signed)
Medicine attending: Medical history, presenting problems, physical findings, and medications, reviewed with resident physician Dr  Jeremy Ford on the day of the patient visit and I concur with his evaluation and management plan. 

## 2015-11-19 LAB — BMP8+ANION GAP
ANION GAP: 20 mmol/L — AB (ref 10.0–18.0)
BUN / CREAT RATIO: 15 (ref 11–26)
BUN: 13 mg/dL (ref 8–27)
CO2: 24 mmol/L (ref 18–29)
Calcium: 9.6 mg/dL (ref 8.7–10.3)
Chloride: 98 mmol/L (ref 96–106)
Creatinine, Ser: 0.86 mg/dL (ref 0.57–1.00)
GFR calc Af Amer: 81 mL/min/{1.73_m2} (ref >59)
GFR, EST NON AFRICAN AMERICAN: 71 mL/min/{1.73_m2} (ref >59)
Glucose: 97 mg/dL (ref 65–99)
POTASSIUM: 4.4 mmol/L (ref 3.5–5.2)
SODIUM: 142 mmol/L (ref 134–144)

## 2015-11-20 NOTE — Assessment & Plan Note (Addendum)
A: This appears to be a ongoing problem that is intermittently severe. Overall, she says this problem is not too distressing  P: - I will prescribe 15 pills of diazepam 5 mg (no refills) in cases of severe leg cramping. - BMET reassuring, no electrolyte abnormalities

## 2015-11-20 NOTE — Assessment & Plan Note (Addendum)
A: Patient is tolerating benicar well. Blood pressure is well controlled (130/66). She has been exercising less due to the cold weather, but hopes to start back in the spring. I had previously discontinued metoprolol since her chest pain in the fall was unlikely ACS (low risk stress test).   P: - Refill benicar - Encourage exercise - BMET today

## 2015-11-20 NOTE — Assessment & Plan Note (Signed)
A: No rashes today P: Refill kenalog cream

## 2015-11-22 NOTE — Progress Notes (Signed)
Medicine attending: Medical history, presenting problems, physical findings, and medications, reviewed with resident physician Dr Liberty Handy on the day of the patient visit and I concur with his evaluation and management plan which we discussed together.

## 2015-11-23 ENCOUNTER — Other Ambulatory Visit: Payer: Self-pay

## 2015-11-23 DIAGNOSIS — Z1231 Encounter for screening mammogram for malignant neoplasm of breast: Secondary | ICD-10-CM

## 2015-12-17 ENCOUNTER — Ambulatory Visit
Admission: RE | Admit: 2015-12-17 | Discharge: 2015-12-17 | Disposition: A | Discharge status: Home / Self Care | Payer: Medicare Other | Source: Ambulatory Visit

## 2015-12-17 DIAGNOSIS — Z1231 Encounter for screening mammogram for malignant neoplasm of breast: Secondary | ICD-10-CM | POA: Diagnosis not present

## 2015-12-20 ENCOUNTER — Ambulatory Visit (INDEPENDENT_AMBULATORY_CARE_PROVIDER_SITE_OTHER): Payer: Medicare Other | Admitting: Internal Medicine

## 2015-12-20 ENCOUNTER — Encounter: Payer: Self-pay | Admitting: Internal Medicine

## 2015-12-20 VITALS — BP 125/68 | HR 73 | Temp 97.9°F | Ht 63.0 in | Wt 193.2 lb

## 2015-12-20 DIAGNOSIS — K219 Gastro-esophageal reflux disease without esophagitis: Secondary | ICD-10-CM

## 2015-12-20 DIAGNOSIS — J029 Acute pharyngitis, unspecified: Secondary | ICD-10-CM | POA: Diagnosis not present

## 2015-12-20 MED ORDER — MENTHOL 3 MG MT LOZG: 1.0000 | LOZENGE | OROMUCOSAL | Status: DC | PRN

## 2015-12-20 MED ORDER — ESOMEPRAZOLE MAGNESIUM 40 MG PO CPDR: 40.0000 mg | DELAYED_RELEASE_CAPSULE | Freq: Every day | ORAL | Status: DC

## 2015-12-20 MED ORDER — FLUTICASONE PROPIONATE 50 MCG/ACT NA SUSP: 1.0000 | Freq: Every day | NASAL | Status: DC

## 2015-12-20 MED ORDER — GUAIFENESIN ER 600 MG PO TB12: 600.0000 mg | ORAL_TABLET | Freq: Two times a day (BID) | ORAL | Status: DC

## 2015-12-20 NOTE — Progress Notes (Signed)
   Subjective:   Patient ID: Eileen Cameron female   DOB: 1951-10-09 65 y.o.   MRN: [id number]  HPI: [name] is a 65 y.o. female w/ PMHx of HTN, HLD, and seasonal allergies, presents to the clinic today for an acute visit for sore throat. Patient states her grandson came home about 1-2 weeks ago with sore throat, found out to have Strep and she is concerned she might have it. She states she has had a "scratchy" throat for 2 weeks, also with dry cough, nasal and sinus congestion, mild frontal headache, mild myalgias, and abdominal discomfort. No fevers at home. Says she did check her temp and it was 97.7 two days ago when she felt the worst. No SOB, chest pain, vomiting, palpitations, dizziness, or lightheadedness.   Past Medical History  Diagnosis Date  . Hypertension   . Hyperlipidemia   . Allergy     seasonal allergies, ace inhibitors--angioedema   Current Outpatient Prescriptions  Medication Sig Dispense Refill  . cetirizine (ZYRTEC) 10 MG tablet Take 10 mg by mouth daily as needed for allergies.     . diazepam (VALIUM) 5 MG tablet Take 1 tablet (5 mg total) by mouth at bedtime as needed for anxiety. 15 tablet 0  . fluticasone (FLONASE) 50 MCG/ACT nasal spray Place 2 sprays into both nostrils daily. (Patient taking differently: Place 2 sprays into both nostrils daily as needed for allergies (congestion). ) 16 g 2  . Melatonin 3 MG TABS Take 3 mg by mouth at bedtime.    Marland Kitchen olmesartan-hydrochlorothiazide (BENICAR HCT) 40-25 MG tablet Take 1 tablet by mouth daily. 30 tablet 5  . Tetrahydrozoline-Zn Sulfate (VISINE-AC OP) Place 1 drop into both eyes daily as needed (itching).    . triamcinolone cream (KENALOG) 0.1 % Apply 1 application topically 2 (two) times daily as needed (eczema). 30 g 6   No current facility-administered medications for this visit.    Review of Systems: General: Positive for fatigue. Denies fever, chills, diaphoresis, appetite change.  Respiratory: Positive  for sore throat and dry cough. Denies SOB, DOE, and wheezing.   Cardiovascular: Denies chest pain and palpitations.  Gastrointestinal: Positive for abdominal discomfort. Denies nausea, vomiting, and diarrhea.  Genitourinary: Denies dysuria, increased frequency, and flank pain. Endocrine: Denies hot or cold intolerance, polyuria, and polydipsia. Musculoskeletal: Positive for myalgias. Denies back pain, joint swelling, arthralgias and gait problem.  Skin: Denies pallor, rash and wounds.  Neurological: Denies dizziness, seizures, syncope, weakness, lightheadedness, numbness and headaches.  Psychiatric/Behavioral: Denies mood changes, and sleep disturbances.  Objective:   Physical Exam: Filed Vitals:   12/20/15 0912  BP: 125/68  Pulse: 73  Temp: 97.9 F (36.6 C)  TempSrc: Oral  Height: 5\' 3"  (1.6 m)  Weight: 193 lb 3.2 oz (87.635 kg)  SpO2: 99%    General: AA female, alert, cooperative, NAD. HEENT: PERRL, EOMI. Moist mucus membranes. No pharyngeal erythema, swelling, or exudates. Mild frontal sinus tenderness.  Neck: Full range of motion without pain, supple, no lymphadenopathy or carotid bruits Lungs: Clear to ascultation bilaterally, normal work of respiration, no wheezes, rales, rhonchi Heart: RRR, no murmurs, gallops, or rubs Abdomen: Soft, non-tender, non-distended, BS + Extremities: No cyanosis, clubbing, or edema Neurologic: Alert & oriented x3, cranial nerves II-XII intact, strength grossly intact, sensation intact to light touch   Assessment & Plan:   Please see problem based assessment and plan.

## 2015-12-20 NOTE — Assessment & Plan Note (Signed)
Sore, scratchy throat for 2 weeks, accompanied by sinus congestion, dry cough, and frontal headache. Also with mild abdominal discomfort. Centor Criteria 0/4. Sounds like viral sinusitis/pharyngitis with post-nasal drip.  -Give Rx for Flonase -Nexium for reflux symptoms that may be contributing to sore throat -Mucinex -Cepacol lozenges -RTC in March

## 2015-12-20 NOTE — Assessment & Plan Note (Signed)
Patient does describe some symptoms of reflux. Has taken PPI in the past but not for some time. May also be contributing to her sore throat. -Rx for Nexium 40 mg daily

## 2015-12-20 NOTE — Patient Instructions (Signed)
1. Please return for follow up in March.  2. Please take all medications as previously prescribed with the following changes:  Start using Flonase nasal spray daily for sinus congestion.   Take Nexium 40 mg daily for reflux. This may also help with sore throat.   Use Mucinex and Cepacol as needed for congestion and sore throat.   3. If you have worsening of your symptoms or new symptoms arise, please call the clinic [phone]), or go to the ER immediately if symptoms are severe.    Viral Infections A viral infection can be caused by different types of viruses.Most viral infections are not serious and resolve on their own. However, some infections may cause severe symptoms and may lead to further complications. SYMPTOMS Viruses can frequently cause:  Minor sore throat.  Aches and pains.  Headaches.  Runny nose.  Different types of rashes.  Watery eyes.  Tiredness.  Cough.  Loss of appetite.  Gastrointestinal infections, resulting in nausea, vomiting, and diarrhea. These symptoms do not respond to antibiotics because the infection is not caused by bacteria. However, you might catch a bacterial infection following the viral infection. This is sometimes called a "superinfection." Symptoms of such a bacterial infection may include:  Worsening sore throat with pus and difficulty swallowing.  Swollen neck glands.  Chills and a high or persistent fever.  Severe headache.  Tenderness over the sinuses.  Persistent overall ill feeling (malaise), muscle aches, and tiredness (fatigue).  Persistent cough.  Yellow, green, or brown mucus production with coughing. HOME CARE INSTRUCTIONS   Only take over-the-counter or prescription medicines for pain, discomfort, diarrhea, or fever as directed by your caregiver.  Drink enough water and fluids to keep your urine clear or pale yellow. Sports drinks can provide valuable electrolytes, sugars, and hydration.  Get plenty of rest  and maintain proper nutrition. Soups and broths with crackers or rice are fine. SEEK IMMEDIATE MEDICAL CARE IF:   You have severe headaches, shortness of breath, chest pain, neck pain, or an unusual rash.  You have uncontrolled vomiting, diarrhea, or you are unable to keep down fluids.  You or your child has an oral temperature above 102 F (38.9 C), not controlled by medicine.  Your baby is older than 3 months with a rectal temperature of 102 F (38.9 C) or higher.  Your baby is 30 months old or younger with a rectal temperature of 100.4 F (38 C) or higher. MAKE SURE YOU:   Understand these instructions.  Will watch your condition.  Will get help right away if you are not doing well or get worse.   This information is not intended to replace advice given to you by your health care provider. Make sure you discuss any questions you have with your health care provider.   Document Released: 08/02/2005 Document Revised: 01/15/2012 Document Reviewed: 03/31/2015 Elsevier Interactive Patient Education [employer].

## 2015-12-25 NOTE — Progress Notes (Signed)
Internal Medicine Clinic Attending  Case discussed with Dr. Jones at the time of the visit.  We reviewed the resident's history and exam and pertinent patient test results.  I agree with the assessment, diagnosis, and plan of care documented in the resident's note.  

## 2016-01-19 ENCOUNTER — Ambulatory Visit (INDEPENDENT_AMBULATORY_CARE_PROVIDER_SITE_OTHER): Payer: Medicare Other | Admitting: Internal Medicine

## 2016-01-19 ENCOUNTER — Encounter: Payer: Self-pay | Admitting: Internal Medicine

## 2016-01-19 VITALS — BP 141/68 | HR 79 | Temp 97.6°F | Ht 63.0 in | Wt 194.7 lb

## 2016-01-19 DIAGNOSIS — Z8739 Personal history of other diseases of the musculoskeletal system and connective tissue: Secondary | ICD-10-CM | POA: Diagnosis not present

## 2016-01-19 DIAGNOSIS — Z09 Encounter for follow-up examination after completed treatment for conditions other than malignant neoplasm: Secondary | ICD-10-CM

## 2016-01-19 DIAGNOSIS — M5416 Radiculopathy, lumbar region: Secondary | ICD-10-CM

## 2016-01-19 DIAGNOSIS — Z87898 Personal history of other specified conditions: Secondary | ICD-10-CM | POA: Diagnosis not present

## 2016-01-19 DIAGNOSIS — M25562 Pain in left knee: Secondary | ICD-10-CM | POA: Diagnosis not present

## 2016-01-19 DIAGNOSIS — I10 Essential (primary) hypertension: Secondary | ICD-10-CM

## 2016-01-19 DIAGNOSIS — J029 Acute pharyngitis, unspecified: Secondary | ICD-10-CM

## 2016-01-19 MED ORDER — OLMESARTAN MEDOXOMIL-HCTZ 40-25 MG PO TABS: 1.0000 | ORAL_TABLET | Freq: Every day | ORAL | Status: DC

## 2016-01-19 MED ORDER — DICLOFENAC SODIUM 1 % TD GEL: 2.0000 g | Freq: Four times a day (QID) | TRANSDERMAL | Status: DC

## 2016-01-19 MED ORDER — MELOXICAM 7.5 MG PO TABS: 7.5000 mg | ORAL_TABLET | Freq: Every day | ORAL | Status: DC

## 2016-01-19 NOTE — Progress Notes (Deleted)
Patient ID: Lun Breit, female   DOB: Jul 20, 1951, 65 y.o.   MRN: [id number]   Subjective:   Patient ID: Desmonique Helfman female   DOB: 06-01-1951 65 y.o.   MRN: [id number]  HPI: Ms.Valeta Kubek is a 65 y.o.     Past Medical History  Diagnosis Date  . Hypertension   . Hyperlipidemia   . Allergy     seasonal allergies, ace inhibitors--angioedema   Current Outpatient Prescriptions  Medication Sig Dispense Refill  . cetirizine (ZYRTEC) 10 MG tablet Take 10 mg by mouth daily as needed for allergies.     . diazepam (VALIUM) 5 MG tablet Take 1 tablet (5 mg total) by mouth at bedtime as needed for anxiety. 15 tablet 0  . esomeprazole (NEXIUM) 40 MG capsule Take 1 capsule (40 mg total) by mouth daily. 30 capsule 1  . fluticasone (FLONASE) 50 MCG/ACT nasal spray Place 1 spray into both nostrils daily. 16 g 2  . guaiFENesin (MUCINEX) 600 MG 12 hr tablet Take 1 tablet (600 mg total) by mouth 2 (two) times daily. 60 tablet 2  . Melatonin 3 MG TABS Take 3 mg by mouth at bedtime.    Marland Kitchen menthol-cetylpyridinium (CEPACOL REGULAR STRENGTH) 3 MG lozenge Take 1 lozenge (3 mg total) by mouth as needed for sore throat. 100 tablet 1  . olmesartan-hydrochlorothiazide (BENICAR HCT) 40-25 MG tablet Take 1 tablet by mouth daily. 30 tablet 5  . Tetrahydrozoline-Zn Sulfate (VISINE-AC OP) Place 1 drop into both eyes daily as needed (itching).    . triamcinolone cream (KENALOG) 0.1 % Apply 1 application topically 2 (two) times daily as needed (eczema). 30 g 6   No current facility-administered medications for this visit.   Family History  Problem Relation Age of Onset  . Heart disease Mother 70  . Diabetes Mother   . Hypertension Sister   . Heart disease Maternal Grandmother    Social History   Social History  . Marital Status: Divorced    Spouse Name: N/A  . Number of Children: N/A  . Years of Education: N/A   Social History Main Topics  . Smoking status: Never Smoker   . Smokeless tobacco: Not on  file  . Alcohol Use: No  . Drug Use: No  . Sexual Activity: Not on file   Other Topics Concern  . Not on file   Social History Narrative   Review of Systems: {Review Of Systems:30496} Objective:  Physical Exam: There were no vitals filed for this visit. {Exam, Complete:17964} Assessment & Plan:

## 2016-01-19 NOTE — Patient Instructions (Signed)
1. Please make a follow up appointment in 3 months.   2. Please take all medications as previously prescribed with the following changes:  Start taking Mobic 7.5 mg daily ONLY AS NEEDED for knee pain. This is not a medication you will take forever, only short term for knee discomfort. This medication will also help with your left hand arthritis pain.   Also start using Voltaren gel on the left knee as well.   Sleep with a pillow in between your knees.   Use heat on the lower back, and ice on the left knee if it hurts at the ned of the day.   3. If you have worsening of your symptoms or new symptoms arise, please call the clinic [phone] or go to the ER immediately if symptoms are severe.  You have done a great job in taking all your medications. Please continue to do this.  Arthritis Arthritis is a term that is commonly used to refer to joint pain or joint disease. There are more than 100 types of arthritis. CAUSES The most common cause of this condition is wear and tear of a joint. Other causes include:  Gout.  Inflammation of a joint.  An infection of a joint.  Sprains and other injuries near the joint.  A drug reaction or allergic reaction. In some cases, the cause may not be known. SYMPTOMS The main symptom of this condition is pain in the joint with movement. Other symptoms include:  Redness, swelling, or stiffness at a joint.  Warmth coming from the joint.  Fever.  Overall feeling of illness. DIAGNOSIS This condition may be diagnosed with a physical exam and tests, including:  Blood tests.  Urine tests.  Imaging tests, such as MRI, X-rays, or a CT scan. Sometimes, fluid is removed from a joint for testing. TREATMENT Treatment for this condition may involve:  Treatment of the cause, if it is known.  Rest.  Raising (elevating) the joint.  Applying cold or hot packs to the joint.  Medicines to improve symptoms and reduce inflammation.  Injections  of a steroid such as cortisone into the joint to help reduce pain and inflammation. Depending on the cause of your arthritis, you may need to make lifestyle changes to reduce stress on your joint. These changes may include exercising more and losing weight. HOME CARE INSTRUCTIONS Medicines  Take over-the-counter and prescription medicines only as told by your health care provider.  Do not take aspirin to relieve pain if gout is suspected. Activities  Rest your joint if told by your health care provider. Rest is important when your disease is active and your joint feels painful, swollen, or stiff.  Avoid activities that make the pain worse. It is important to balance activity with rest.  Exercise your joint regularly with range-of-motion exercises as told by your health care provider. Try doing low-impact exercise, such as:  Swimming.  Water aerobics.  Biking.  Walking. Joint Care  If your joint is swollen, keep it elevated if told by your health care provider.  If your joint feels stiff in the morning, try taking a warm shower.  If directed, apply heat to the joint. If you have diabetes, do not apply heat without permission from your health care provider.  Put a towel between the joint and the hot pack or heating pad.  Leave the heat on the area for 20-30 minutes.  If directed, apply ice to the joint:  Put ice in a plastic bag.  Place  a towel between your skin and the bag.  Leave the ice on for 20 minutes, 2-3 times per day.  Keep all follow-up visits as told by your health care provider. This is important. SEEK MEDICAL CARE IF:  The pain gets worse.  You have a fever. SEEK IMMEDIATE MEDICAL CARE IF:  You develop severe joint pain, swelling, or redness.  Many joints become painful and swollen.  You develop severe back pain.  You develop severe weakness in your leg.  You cannot control your bladder or bowels.   This information is not intended to replace  advice given to you by your health care provider. Make sure you discuss any questions you have with your health care provider.   Document Released: 11/30/2004 Document Revised: 07/14/2015 Document Reviewed: 01/18/2015 Elsevier Interactive Patient Education [employer].

## 2016-01-19 NOTE — Progress Notes (Signed)
Subjective:   Patient ID: Eileen Cameron female   DOB: [birth year] 65 y.o.   MRN: [id number]  HPI: [name] is a 65 y.o. female w/ PMHx of HTN, HLD, and seasonal allergies, presents to the clinic today for a follow up visit for leg cramps. She was last seen in the clinic in 12/2015 for sore throat but states that this has completely resolved. She is has been feeling very well recently aside from some leg pain, for which she was given Valium to help her sleep at night with the pain, but she states this had the opposite effect and kept her up at night. The pain she describes is in her lateral left knee, worse when she sleeps. She describes it as an aching sensation, different than her previously described sciatic type pain in her thigh. She denies pain in her feet or into her left calf. She sometimes has some aching feeling in her right calf too, but doesn't think this is very significant.   She also describes some pain in the right hand when she sleeps and lies on it. She describes it as a numbness sensation mostly in her thumb and pointer finger. She also describes some arthritis type pain in her pointer finger; dull aching, worse with increasing activity.   Past Medical History  Diagnosis Date  . Hypertension   . Hyperlipidemia   . Allergy     seasonal allergies, ace inhibitors--angioedema   Current Outpatient Prescriptions  Medication Sig Dispense Refill  . cetirizine (ZYRTEC) 10 MG tablet Take 10 mg by mouth daily as needed for allergies.     . diazepam (VALIUM) 5 MG tablet Take 1 tablet (5 mg total) by mouth at bedtime as needed for anxiety. 15 tablet 0  . esomeprazole (NEXIUM) 40 MG capsule Take 1 capsule (40 mg total) by mouth daily. 30 capsule 1  . fluticasone (FLONASE) 50 MCG/ACT nasal spray Place 1 spray into both nostrils daily. 16 g 2  . guaiFENesin (MUCINEX) 600 MG 12 hr tablet Take 1 tablet (600 mg total) by mouth 2 (two) times daily. 60 tablet 2  . Melatonin 3 MG TABS  Take 3 mg by mouth at bedtime.    Marland Kitchen menthol-cetylpyridinium (CEPACOL REGULAR STRENGTH) 3 MG lozenge Take 1 lozenge (3 mg total) by mouth as needed for sore throat. 100 tablet 1  . olmesartan-hydrochlorothiazide (BENICAR HCT) 40-25 MG tablet Take 1 tablet by mouth daily. 30 tablet 5  . Tetrahydrozoline-Zn Sulfate (VISINE-AC OP) Place 1 drop into both eyes daily as needed (itching).    . triamcinolone cream (KENALOG) 0.1 % Apply 1 application topically 2 (two) times daily as needed (eczema). 30 g 6   No current facility-administered medications for this visit.    Review of Systems  General: Denies fever, diaphoresis, appetite change, and fatigue.  Respiratory: Denies SOB, cough, and wheezing.   Cardiovascular: Denies chest pain and palpitations.  Gastrointestinal: Denies nausea, vomiting, abdominal pain, and diarrhea Musculoskeletal: Positive for left knee pain and left hand pain. Denies myalgias, back pain, and gait problem.  Neurological: Denies dizziness, syncope, weakness, lightheadedness, and headaches.  Psychiatric/Behavioral: Denies mood changes, sleep disturbance, and agitation.   Objective:   Physical Exam: Filed Vitals:   01/19/16 0906  BP: 141/68  Pulse: 79  Temp: 97.6 F (36.4 C)  TempSrc: Oral  Height: 5\' 3"  (1.6 m)  Weight: 194 lb 11.2 oz (88.315 kg)  SpO2: 100%    General: Alert, cooperative, NAD. HEENT: PERRL, EOMI. Moist mucus  membranes Neck: Full range of motion without pain, supple, no lymphadenopathy or carotid bruits Lungs: Clear to ascultation bilaterally, normal work of respiration, no wheezes, rales, rhonchi Heart: RRR, no murmurs, gallops, or rubs Abdomen: Soft, non-tender, non-distended, BS + Extremities: No cyanosis, clubbing, or edema. Tenderness over the left lateral knee. Otherwise normal. ROM intact.  Neurologic: Alert & oriented X3, cranial nerves II-XII intact, strength grossly intact, sensation intact to light touch   Assessment & Plan:    Please see problem based assessment and plan.

## 2016-01-19 NOTE — Assessment & Plan Note (Signed)
Mildly elevated today, although this seems to be a random elevation. No further management at this time. Could consider increasing or adding an agent at her next visit.

## 2016-01-19 NOTE — Assessment & Plan Note (Signed)
This does not seem to be the issue currently. Negative straight leg raise bilaterally. No tenderness over the lumbar spine. Could consider adding evening dose of Neurontin if this is an issue in the future.

## 2016-01-19 NOTE — Assessment & Plan Note (Signed)
Most likely related to osteoarthritis. Mild tenderness over the left lateral knee. Worse with sleep.  -Sleep with pillow in between the knees -Mobic 7.5 mg daily prn -Voltaren gel prn

## 2016-01-19 NOTE — Assessment & Plan Note (Signed)
Resolved

## 2016-01-25 NOTE — Progress Notes (Signed)
Internal Medicine Clinic Attending  Case discussed with Dr. Jones at the time of the visit.  We reviewed the resident's history and exam and pertinent patient test results.  I agree with the assessment, diagnosis, and plan of care documented in the resident's note.  

## 2016-04-20 ENCOUNTER — Ambulatory Visit (INDEPENDENT_AMBULATORY_CARE_PROVIDER_SITE_OTHER): Payer: Medicare Other | Admitting: Internal Medicine

## 2016-04-20 ENCOUNTER — Encounter: Payer: Self-pay | Admitting: Internal Medicine

## 2016-04-20 VITALS — BP 140/85 | HR 69 | Temp 98.1°F | Ht 63.0 in | Wt 191.2 lb

## 2016-04-20 DIAGNOSIS — I10 Essential (primary) hypertension: Secondary | ICD-10-CM

## 2016-04-20 DIAGNOSIS — G4700 Insomnia, unspecified: Secondary | ICD-10-CM

## 2016-04-20 DIAGNOSIS — Z7289 Other problems related to lifestyle: Secondary | ICD-10-CM | POA: Insufficient documentation

## 2016-04-20 DIAGNOSIS — Z79899 Other long term (current) drug therapy: Secondary | ICD-10-CM

## 2016-04-20 DIAGNOSIS — M25511 Pain in right shoulder: Secondary | ICD-10-CM

## 2016-04-20 DIAGNOSIS — Z87898 Personal history of other specified conditions: Secondary | ICD-10-CM | POA: Insufficient documentation

## 2016-04-20 DIAGNOSIS — Z872 Personal history of diseases of the skin and subcutaneous tissue: Secondary | ICD-10-CM | POA: Diagnosis not present

## 2016-04-20 DIAGNOSIS — L259 Unspecified contact dermatitis, unspecified cause: Secondary | ICD-10-CM | POA: Insufficient documentation

## 2016-04-20 HISTORY — DX: Pain in right shoulder: M25.511

## 2016-04-20 MED ORDER — TRAZODONE HCL 50 MG PO TABS: 50.0000 mg | ORAL_TABLET | Freq: Every day | ORAL | Status: DC

## 2016-04-20 MED ORDER — TRIAMCINOLONE ACETONIDE 0.1 % EX CREA: 1.0000 "application " | TOPICAL_CREAM | Freq: Two times a day (BID) | CUTANEOUS | Status: DC | PRN

## 2016-04-20 MED ORDER — OLMESARTAN MEDOXOMIL-HCTZ 40-25 MG PO TABS: 1.0000 | ORAL_TABLET | Freq: Every day | ORAL | Status: DC

## 2016-04-20 MED ORDER — DICLOFENAC SODIUM 1 % TD GEL: 2.0000 g | Freq: Four times a day (QID) | TRANSDERMAL | Status: DC

## 2016-04-20 NOTE — Patient Instructions (Signed)
Eileen Cameron,  It was such a pleasure taking care of you this year.  For you sleep problems, we're gonna start you on Trazodone 50 mg every night.  Your blood pressure looks great.  We'll see you in 6 months!  Insomnia Insomnia is a sleep disorder that makes it difficult to fall asleep or to stay asleep. Insomnia can cause tiredness (fatigue), low energy, difficulty concentrating, mood swings, and poor performance at work or school.  There are three different ways to classify insomnia:  Difficulty falling asleep.  Difficulty staying asleep.  Waking up too early in the morning. Any type of insomnia can be long-term (chronic) or short-term (acute). Both are common. Short-term insomnia usually lasts for three months or less. Chronic insomnia occurs at least three times a week for longer than three months. CAUSES  Insomnia may be caused by another condition, situation, or substance, such as:  Anxiety.  Certain medicines.  Gastroesophageal reflux disease (GERD) or other gastrointestinal conditions.  Asthma or other breathing conditions.  Restless legs syndrome, sleep apnea, or other sleep disorders.  Chronic pain.  Menopause. This may include hot flashes.  Stroke.  Abuse of alcohol, tobacco, or illegal drugs.  Depression.  Caffeine.   Neurological disorders, such as Alzheimer disease.  An overactive thyroid (hyperthyroidism). The cause of insomnia may not be known. RISK FACTORS Risk factors for insomnia include:  Gender. Women are more commonly affected than men.  Age. Insomnia is more common as you get older.  Stress. This may involve your professional or personal life.  Income. Insomnia is more common in people with lower income.  Lack of exercise.   Irregular work schedule or night shifts.  Traveling between different time zones. SIGNS AND SYMPTOMS If you have insomnia, trouble falling asleep or trouble staying asleep is the main symptom. This may lead  to other symptoms, such as:  Feeling fatigued.  Feeling nervous about going to sleep.  Not feeling rested in the morning.  Having trouble concentrating.  Feeling irritable, anxious, or depressed. TREATMENT  Treatment for insomnia depends on the cause. If your insomnia is caused by an underlying condition, treatment will focus on addressing the condition. Treatment may also include:   Medicines to help you sleep.  Counseling or therapy.  Lifestyle adjustments. HOME CARE INSTRUCTIONS   Take medicines only as directed by your health care provider.  Keep regular sleeping and waking hours. Avoid naps.  Keep a sleep diary to help you and your health care provider figure out what could be causing your insomnia. Include:   When you sleep.  When you wake up during the night.  How well you sleep.   How rested you feel the next day.  Any side effects of medicines you are taking.  What you eat and drink.   Make your bedroom a comfortable place where it is easy to fall asleep:  Put up shades or special blackout curtains to block light from outside.  Use a white noise machine to block noise.  Keep the temperature cool.   Exercise regularly as directed by your health care provider. Avoid exercising right before bedtime.  Use relaxation techniques to manage stress. Ask your health care provider to suggest some techniques that may work well for you. These may include:  Breathing exercises.  Routines to release muscle tension.  Visualizing peaceful scenes.  Cut back on alcohol, caffeinated beverages, and cigarettes, especially close to bedtime. These can disrupt your sleep.  Do not overeat or eat spicy  foods right before bedtime. This can lead to digestive discomfort that can make it hard for you to sleep.  Limit screen use before bedtime. This includes:  Watching TV.  Using your smartphone, tablet, and computer.  Stick to a routine. This can help you fall asleep  faster. Try to do a quiet activity, brush your teeth, and go to bed at the same time each night.  Get out of bed if you are still awake after 15 minutes of trying to sleep. Keep the lights down, but try reading or doing a quiet activity. When you feel sleepy, go back to bed.  Make sure that you drive carefully. Avoid driving if you feel very sleepy.  Keep all follow-up appointments as directed by your health care provider. This is important. SEEK MEDICAL CARE IF:   You are tired throughout the day or have trouble in your daily routine due to sleepiness.  You continue to have sleep problems or your sleep problems get worse. SEEK IMMEDIATE MEDICAL CARE IF:   You have serious thoughts about hurting yourself or someone else.   This information is not intended to replace advice given to you by your health care provider. Make sure you discuss any questions you have with your health care provider.   Document Released: 10/20/2000 Document Revised: 07/14/2015 Document Reviewed: 07/24/2014 Elsevier Interactive Patient Education [employer].

## 2016-04-20 NOTE — Assessment & Plan Note (Signed)
A: Patient has exhausted non-pharmacologic measures.  P: Trazodone 50 mg qhs

## 2016-04-20 NOTE — Assessment & Plan Note (Signed)
A: Fits more with muscular soreness and fatigue rather than tendinopathy or rotator cuff tear based on my exam and history.  P: Voltaren gel

## 2016-04-20 NOTE — Progress Notes (Signed)
   Subjective:    Patient ID: Eileen Cameron, female    DOB: 04-16-1951, 65 y.o.   MRN: [id number]  HPI  [name] is 65 year old woman with a PMH as below who comes to the the clinic to discuss right shoulder pain, insomnia, and other chronic medical issues.  Right shoulder pain: She describes it as a muscle pain worse at the end of the day. She tends to wear her purse on the right. She endorses full range of motion and no swelling.  Insomnia: Difficulty initiating sleep. She reports good sleep hygiene. She takes no medications for this.  Contact Dermatitis: Occasionally gets rash on neck when she wears necklaces. Topical steroids have been effective in the past. "She is not currently symptomatic.  HTN: She has been adherent to benicar without side effects.    Past medical history    Diagnosis Date Noted  . Essential hypertension 04/01/2009  . Preventative health care 08/26/2012  . Seasonal allergies 12/31/2013  . Insomnia 09/18/2014  . GERD (gastroesophageal reflux disease) 11/28/2014  . Adrenal incidentaloma ([place]) 07/08/2015  . Leg cramps 11/18/2015  . Right shoulder pain 04/20/2016  . Contact dermatitis 04/20/2016       Review of Systems  Musculoskeletal: Positive for arthralgias. Negative for back pain.  Skin: Negative for pallor and rash.       Objective:   Physical Exam  Cardiovascular: Normal rate, regular rhythm and normal heart sounds.   Pulmonary/Chest: Effort normal and breath sounds normal. No respiratory distress. She has no wheezes.  Musculoskeletal: Normal range of motion. She exhibits no tenderness.  Vitals reviewed.         Assessment & Plan:   Please see problem based assessment and plan for details.

## 2016-04-20 NOTE — Assessment & Plan Note (Signed)
A: Not currently symptomatic. No rash on exam.  P; refill Kenalog cream

## 2016-04-20 NOTE — Assessment & Plan Note (Signed)
A: BP 140/85 today, essentially at goal.  P: Refill Benicar

## 2016-04-22 NOTE — Progress Notes (Signed)
Internal Medicine Clinic Attending  Case discussed with Dr. Ford at the time of the visit.  We reviewed the resident's history and exam and pertinent patient test results.  I agree with the assessment, diagnosis, and plan of care documented in the resident's note.  

## 2016-05-03 ENCOUNTER — Encounter: Payer: Self-pay | Admitting: *Deleted

## 2016-07-18 ENCOUNTER — Ambulatory Visit (INDEPENDENT_AMBULATORY_CARE_PROVIDER_SITE_OTHER): Payer: Medicare Other | Admitting: Internal Medicine

## 2016-07-18 ENCOUNTER — Encounter: Payer: Self-pay | Admitting: Internal Medicine

## 2016-07-18 VITALS — BP 154/69 | HR 76 | Temp 98.0°F | Ht 63.0 in | Wt 189.7 lb

## 2016-07-18 DIAGNOSIS — I10 Essential (primary) hypertension: Secondary | ICD-10-CM | POA: Diagnosis not present

## 2016-07-18 DIAGNOSIS — Z23 Encounter for immunization: Secondary | ICD-10-CM | POA: Insufficient documentation

## 2016-07-18 DIAGNOSIS — M5417 Radiculopathy, lumbosacral region: Secondary | ICD-10-CM

## 2016-07-18 DIAGNOSIS — K219 Gastro-esophageal reflux disease without esophagitis: Secondary | ICD-10-CM | POA: Diagnosis not present

## 2016-07-18 DIAGNOSIS — M5416 Radiculopathy, lumbar region: Secondary | ICD-10-CM

## 2016-07-18 MED ORDER — MELOXICAM 7.5 MG PO TABS: 7.5000 mg | ORAL_TABLET | Freq: Every day | ORAL | 1 refills | Status: DC

## 2016-07-18 MED ORDER — ESOMEPRAZOLE MAGNESIUM 40 MG PO CPDR: 40.0000 mg | DELAYED_RELEASE_CAPSULE | Freq: Every day | ORAL | 1 refills | Status: DC

## 2016-07-18 NOTE — Progress Notes (Signed)
CC: here today for low back pain  HPI:  Eileen Cameron is a 65 y.o. woman with a past medical history listed below here today for low back pain.  For details of today's visit and the status of her chronic medical issues please refer to the assessment and plan.   Past Medical History:  Diagnosis Date  . Allergy    seasonal allergies, ace inhibitors--angioedema  . Hyperlipidemia   . Hypertension     Review of Systems:   Please see pertinent ROS reviewed in HPI and problem based charting.   Physical Exam:  Vitals:   07/18/16 0915  BP: (!) 154/69  Pulse: 76  Temp: 98 F (36.7 C)  TempSrc: Oral  SpO2: 100%  Weight: 189 lb 11.2 oz (86 kg)  Height: 5\' 3"  (1.6 m)   Physical Exam  Constitutional: She is oriented to person, place, and time and well-developed, well-nourished, and in no distress.  HENT:  Head: Normocephalic and atraumatic.  Pulmonary/Chest: Effort normal.  Musculoskeletal: Normal range of motion. She exhibits no edema or deformity.  Normal strength testing of her lower extremities. Straight leg raise on the right elicited some discomfort but no radiating symptoms. Mild paraspinal tenderness on R > L  Neurological: She is alert and oriented to person, place, and time.  Skin: Skin is warm and dry.  Psychiatric: Mood and affect normal.     Assessment & Plan:   See Encounters Tab for problem based charting.  Patient discussed with Dr. Eppie Gibson.  Essential hypertension A: BP today elevated at 154/69 in the setting of not taking her BP medications due to staying with her daughter because of last night's weather.    P: - continue current medications with Benicar daily - follow up with her PCP next month as scheduled.  GERD (gastroesophageal reflux disease) A: No current reflux symptoms. Takes Nexium PRN.  P: - refilled her Nexium to take during trial of NSAIDs for her back pain.  Lumbar back pain with radiculopathy affecting right lower  extremity A: Patient presents with lumbar back pain and right sided radiculopathy for 2 weeks.  She has history of similar back pain but reports this as different due to not getting better.  She has used ice packs, heat pads, Voltaren gel, and Aleve for 1 dose with no relief.  She has not been using other NSAIDs.  She does not recall an exacerbating event leading to her pain, but rather a more gradual onset.  She relocated in June to a new residence and reports having to carry heavy grocery bags up her driveway which may have led to some MSK strain.  Pain is aggravated by lifting heavy objects and reports radiating symptoms to the back of her thigh and occasionally to her anterior leg.  She denies any urinary symptoms.  No fever, chills, or incontinence.  No numbness or tingling and no weakness.  Straight leg raise elicited some discomfort in her low back but did not bring about radiating symptoms.  The rest of her exam is consistent with lumbosacral strain and radiculopathy.  She has had an MRI in 2005, x-ray in 2011, and CT abdomen in 2016 which have shown degenerative changes of her lumbar spine.  P: - symptoms are consistent with lumbosacral paraspinal strain and radiculopathy. - will begin conservative management with trial of Mobic 7.5mg  daily with continuation of her PPI for GI prophylaxis - referral for PT evaluation - continue rest, ice and heat as tolerated - do not  see indication for imaging at this point but would consider if not improving or deteriorates - RTC in 3 weeks with her PCP as scheduled or sooner if needed.  Encounter for immunization A/P: - influenza vaccine given today.

## 2016-07-18 NOTE — Patient Instructions (Addendum)
Thank you for coming to see me today. It was a pleasure. Today we talked about:   Blood Pressure: - I think your blood pressure will be better as long as you take your medications.  Back Pain: - I have given you a prescription for MOBIC 7.5mg  daily.  Continue to take your Nexium with this medication. - I have also ordered physical therapy for you to be evaluated and teach you some home exercises. - continue other conservative options like you have been doing such as rest, ice, and heat.  Please follow-up with Dr Heber [place] on October 5.  If you have any questions or concerns, please do not hesitate to call the office at [phone].  Take Care,   Jule Ser, DO

## 2016-07-18 NOTE — Assessment & Plan Note (Signed)
A/P: - influenza vaccine given today.

## 2016-07-18 NOTE — Assessment & Plan Note (Signed)
A: BP today elevated at 154/69 in the setting of not taking her BP medications due to staying with her daughter because of last night's weather.    P: - continue current medications with Benicar daily - follow up with her PCP next month as scheduled.

## 2016-07-18 NOTE — Assessment & Plan Note (Signed)
A: No current reflux symptoms. Takes Nexium PRN.  P: - refilled her Nexium to take during trial of NSAIDs for her back pain.

## 2016-07-18 NOTE — Assessment & Plan Note (Signed)
A: Patient presents with lumbar back pain and right sided radiculopathy for 2 weeks.  She has history of similar back pain but reports this as different due to not getting better.  She has used ice packs, heat pads, Voltaren gel, and Aleve for 1 dose with no relief.  She has not been using other NSAIDs.  She does not recall an exacerbating event leading to her pain, but rather a more gradual onset.  She relocated in June to a new residence and reports having to carry heavy grocery bags up her driveway which may have led to some MSK strain.  Pain is aggravated by lifting heavy objects and reports radiating symptoms to the back of her thigh and occasionally to her anterior leg.  She denies any urinary symptoms.  No fever, chills, or incontinence.  No numbness or tingling and no weakness.  Straight leg raise elicited some discomfort in her low back but did not bring about radiating symptoms.  The rest of her exam is consistent with lumbosacral strain and radiculopathy.  She has had an MRI in 2005, x-ray in 2011, and CT abdomen in 2016 which have shown degenerative changes of her lumbar spine.  P: - symptoms are consistent with lumbosacral paraspinal strain and radiculopathy. - will begin conservative management with trial of Mobic 7.5mg  daily with continuation of her PPI for GI prophylaxis - referral for PT evaluation - continue rest, ice and heat as tolerated - do not see indication for imaging at this point but would consider if not improving or deteriorates - RTC in 3 weeks with her PCP as scheduled or sooner if needed.

## 2016-07-18 NOTE — Progress Notes (Signed)
Case discussed with Dr. Wallace soon after the resident saw the patient. We reviewed the resident's history and exam and pertinent patient test results. I agree with the assessment, diagnosis, and plan of care documented in the resident's note. 

## 2016-08-03 ENCOUNTER — Ambulatory Visit: Payer: Medicare Other | Attending: Internal Medicine | Admitting: Physical Therapy

## 2016-08-03 ENCOUNTER — Encounter: Payer: Self-pay | Admitting: Physical Therapy

## 2016-08-03 DIAGNOSIS — M62830 Muscle spasm of back: Secondary | ICD-10-CM | POA: Insufficient documentation

## 2016-08-03 DIAGNOSIS — M545 Low back pain, unspecified: Secondary | ICD-10-CM

## 2016-08-03 DIAGNOSIS — G8929 Other chronic pain: Secondary | ICD-10-CM | POA: Insufficient documentation

## 2016-08-04 NOTE — Therapy (Addendum)
[place], [place], [address] Phone: [phone]   Fax:  [phone]  Physical Therapy Treatment  Patient Details  Name: Eileen Cameron MRN: [id number] Date of Birth: [birth year] Referring Provider: Jule Ser DO   Encounter Date: 08/03/2016      PT End of Session - 08/11/16 0944    Visit Number 3   Number of Visits 16   Date for PT Re-Evaluation 09/29/16   Authorization Type [employer]    PT Start Time 0933   PT Stop Time 1013   PT Time Calculation (min) 40 min   Activity Tolerance Patient tolerated treatment well   Behavior During Therapy [facility] for tasks assessed/performed      Past Medical History:  Diagnosis Date  . Allergy    seasonal allergies, ace inhibitors--angioedema  . Hyperlipidemia   . Hypertension     Past Surgical History:  Procedure Laterality Date  . COLONOSCOPY N/A 01/08/2013   Procedure: COLONOSCOPY;  Surgeon: Arta Silence, MD;  Location: [facility];  Service: Endoscopy;  Laterality: N/A;  . ECTOPIC PREGNANCY SURGERY  1970s  . right breast abscess drainage    . TONSILLECTOMY    . TUBAL LIGATION      There were no vitals filed for this visit.      Subjective Assessment - 08/11/16 0936    Subjective Patient rpeorts over the past few days she feels like she is back to her old self. She didbend down this morning to get some shoes and felt a little pulling.    Limitations Standing;Sitting   How long can you sit comfortably? >10 minutes    How long can you stand comfortably? > 15 minutes    How long can you walk comfortably? walking does not hurt her back too bad    Diagnostic tests Nothing for the lower back    Currently in Pain? Yes   Pain Score 0-No pain   Pain Location Back   Pain Orientation Mid;Lower   Pain Descriptors / Indicators Constant;Aching;Dull   Pain Type Acute pain   Pain Onset 1 to 4 weeks ago   Pain Frequency Constant   Aggravating Factors  sitting,  standing    Pain Relieving Factors walking    Effect of Pain on Daily Activities sitting for long periods                          [facility] Adult PT Treatment/Exercise - 08/11/16 0001      Lumbar Exercises: Stretches   Single Knee to Chest Stretch Limitations 2x30seconds    Lower Trunk Rotation Limitations x10   Piriformis Stretch Limitations 2x30sec      Lumbar Exercises: Supine   AB Set Limitations x10   Clam Limitations 2x10 red    Bent Knee Raise Limitations 2x10    Other Supine Lumbar Exercises Thomas stretch                 PT Education - 08/11/16 [phone]    Education provided Yes   Education Details Improtance of core stability, hamstring stretch    Person(s) Educated Patient   Methods Explanation;Demonstration;Handout   Comprehension Verbalized understanding;Returned demonstration          PT Short Term Goals - 08/11/16 1034      PT SHORT TERM GOAL #1   Title  Patient will demsotrate a good core contraction    Baseline improved today    Time 8  Period Weeks   Status On-going     PT SHORT TERM GOAL #2   Title Patient will increase bilateral hip flexion to 110 degrees    Time 8   Period Weeks   Status On-going     PT SHORT TERM GOAL #3   Title Patient will report 2/10 pain at worst    Time 8   Period Weeks   Status On-going     PT SHORT TERM GOAL #4   Title Patient will report decreased tenderness to palpation on the right side of the lumbar spine   Time 8   Period Weeks   Status On-going           PT Long Term Goals - 08/04/16 1220      PT LONG TERM GOAL #1   Title Patient will sit for 1 hour without increased pain in order to ride in a car    Time 8   Period Weeks   Status New     PT LONG TERM GOAL #2   Title Patient will stand for 1 hour without increased pain in order to perfrom IADl's    Time 8   Period Weeks   Status New     PT LONG TERM GOAL #3   Title Patient will be independent with HEP    Time 8    Period Weeks   Status New               Plan - 08-16-16 0947    Clinical Impression Statement Patient is making great progress. Her hip mobility on the right has improved significantly. She has less spasming today compared to the last visit. She will come for 1 more visit. If she remains pain free she will likely discharge.    Rehab Potential Good   PT Frequency 2x / week   PT Duration 8 weeks   PT Treatment/Interventions ADLs/Self Care Home Management;Electrical Stimulation;Iontophoresis 4mg /ml Dexamethasone;Cryotherapy;Stair training;Functional mobility training;Passive range of motion;Patient/family education;Gait training;Neuromuscular re-education;Therapeutic activities;Therapeutic exercise;Moist Heat;Traction;Ultrasound;Manual techniques;Taping;Dry needling   PT Next Visit Plan Continue with manual therapy to decrease muscle spasms in the lumbar spine and increase hip flexion. Assess tolerance to stretches. Consider adding LTR, Hip abduction with t-band, Supine marching in pain free range.    PT Home Exercise Plan Single knee to chest stretch, Piriformis stretch    Consulted and Agree with Plan of Care Patient      Patient will benefit from skilled therapeutic intervention in order to improve the following deficits and impairments:  Increased fascial restricitons, Increased muscle spasms, Abnormal gait, Decreased mobility, Decreased knowledge of use of DME, Improper body mechanics, Decreased activity tolerance, Decreased endurance, Decreased strength  Visit Diagnosis: Bilateral low back pain without sciatica - Plan: PT plan of care cert/re-cert  Muscle spasm of back - Plan: PT plan of care cert/re-cert       G-Codes - August 16, 2016 1123    Functional Assessment Tool Used FOTO, clinical decision making    Mobility: Walking and Moving Around Goal Status [phone]) At least 1 percent but less than 20 percent impaired, limited or restricted  Mobility current status 20-40 % limited  Mobility walking around      Problem List Patient Active Problem List   Diagnosis Date Noted  . Encounter for hepatitis C virus screening test for high risk patient 08/10/2016  . Statin intolerance 08/10/2016  . Encounter for immunization 07/18/2016  . Right shoulder pain 04/20/2016  . Contact dermatitis 04/20/2016  . Leg  cramps 11/18/2015  . Adrenal incidentaloma ([place]) 07/08/2015  . GERD (gastroesophageal reflux disease) 11/28/2014  . Insomnia 09/18/2014  . Seasonal allergies 12/31/2013  . Preventative health care 08/26/2012  . Essential hypertension 04/01/2009  . Lumbar back pain with radiculopathy affecting right lower extremity 04/01/2009    Carney Living PT DPT  08/11/2016, 11:24 AM  [place], [place], [address] Phone: [phone]   Fax:  [phone]  Name: Eileen Cameron MRN: [id number] Date of Birth: 06/27/1951

## 2016-08-09 ENCOUNTER — Ambulatory Visit: Payer: Medicare Other | Attending: Internal Medicine | Admitting: Physical Therapy

## 2016-08-09 ENCOUNTER — Telehealth: Payer: Self-pay | Admitting: Internal Medicine

## 2016-08-09 DIAGNOSIS — M545 Low back pain, unspecified: Secondary | ICD-10-CM

## 2016-08-09 DIAGNOSIS — M62830 Muscle spasm of back: Secondary | ICD-10-CM

## 2016-08-09 DIAGNOSIS — G8929 Other chronic pain: Secondary | ICD-10-CM | POA: Diagnosis not present

## 2016-08-09 NOTE — Telephone Encounter (Signed)
APT. REMINDER CALL, LMTCB °

## 2016-08-10 ENCOUNTER — Encounter: Payer: Self-pay | Admitting: Internal Medicine

## 2016-08-10 ENCOUNTER — Ambulatory Visit (INDEPENDENT_AMBULATORY_CARE_PROVIDER_SITE_OTHER): Payer: Medicare Other | Admitting: Internal Medicine

## 2016-08-10 VITALS — BP 148/79 | HR 67 | Temp 98.0°F | Ht 63.0 in | Wt 190.1 lb

## 2016-08-10 DIAGNOSIS — M5416 Radiculopathy, lumbar region: Secondary | ICD-10-CM | POA: Diagnosis not present

## 2016-08-10 DIAGNOSIS — I10 Essential (primary) hypertension: Secondary | ICD-10-CM

## 2016-08-10 DIAGNOSIS — Z9189 Other specified personal risk factors, not elsewhere classified: Secondary | ICD-10-CM

## 2016-08-10 DIAGNOSIS — G4700 Insomnia, unspecified: Secondary | ICD-10-CM

## 2016-08-10 DIAGNOSIS — R252 Cramp and spasm: Secondary | ICD-10-CM

## 2016-08-10 DIAGNOSIS — Z9229 Personal history of other drug therapy: Secondary | ICD-10-CM

## 2016-08-10 DIAGNOSIS — Z79899 Other long term (current) drug therapy: Secondary | ICD-10-CM | POA: Diagnosis not present

## 2016-08-10 DIAGNOSIS — Z1159 Encounter for screening for other viral diseases: Secondary | ICD-10-CM | POA: Diagnosis not present

## 2016-08-10 DIAGNOSIS — J302 Other seasonal allergic rhinitis: Secondary | ICD-10-CM

## 2016-08-10 MED ORDER — NAPROXEN SODIUM 220 MG PO TABS: 220.0000 mg | ORAL_TABLET | Freq: Two times a day (BID) | ORAL | Status: DC

## 2016-08-10 NOTE — Progress Notes (Signed)
[facility] Subjective:  HPI: [name] is a 64 y.o. female who presents for follow-up of hypertension and low back pain.  Please see problem based charting below for the status of her chronic medical problems.  Review of Systems: Review of Systems  Constitutional: Negative for fever.  Respiratory: Negative for shortness of breath.   Cardiovascular: Negative for chest pain.  Musculoskeletal: Positive for back pain.  Neurological: Negative for headaches.  Endo/Heme/Allergies: Negative for polydipsia.  Psychiatric/Behavioral: The patient does not have insomnia.     Objective:  Physical Exam: Vitals:   08/10/16 1007  BP: (!) 148/79  Pulse: 67  Temp: 98 F (36.7 C)  TempSrc: Oral  SpO2: 100%  Weight: 190 lb 1.6 oz (86.2 kg)  Height: 5\' 3"  (1.6 m)   Physical Exam  Constitutional: She is well-developed, well-nourished, and in no distress. No distress.  HENT:  Head: Normocephalic and atraumatic.  Eyes: Conjunctivae are normal.  Cardiovascular: Normal rate, regular rhythm, normal heart sounds and intact distal pulses.   No murmur heard. Pulmonary/Chest: Effort normal and breath sounds normal. No respiratory distress. She has no wheezes. She has no rales.  Abdominal: Soft. Bowel sounds are normal. She exhibits no distension. There is no tenderness.  Musculoskeletal: She exhibits no edema.  Skin: Skin is warm and dry. She is not diaphoretic.  Nursing note and vitals reviewed.  Assessment & Plan:  Essential hypertension History of present illness: Tolerating Benicar no complaints.  Assessment: Essential hypertension well controlled  Plan: Updated BP goal to less than 150/90 due to age. Continue Benicar 40-25 daily  Seasonal allergies History of present illness: Reports this is well controlled with Flonase daily.   Assessment: Seasonal allergies  Plan: Continue Flonase    Lumbar back pain with radiculopathy affecting right lower  extremity History of present illness: She was seen about 3 weeks ago for worsening of her low back pain believed to be MSK strain. She was prescribed meloxicam however she did not pick it up because when she gets the pharmacy she remembered that it previously had made her nauseous. She has however started physical therapy and is already appreciated improvement in her low back pain.  Assessment: Lumbar back pain with mild radiculopathy of right lower extremity  Plan: Continue physical therapy -Start OTC Aleve BID.  Insomnia History of present illness: She's tried many different insomnia medications in the past. She currently finds that melatonin appears to work well.  Assessment insomnia  Plan: Continue melatonin  Leg cramps History of present illness: She has a history of some leg cramps which she reports is worse when her potassium was low. She also reports a history of intolerance to statins she does not remember the name statin and unfortunately there are no statins in our electronic health record for her. She still occasionally has some minor leg cramps but overall is doing very well.  Assessment: Leg cramps  Plan: We'll check a BMP I am somewhat concerned that she could have some vitamin D deficiency which can lead to dose statin intolerance as well as some leg cramps unfortunately she does not appear to have a diagnosis that would allow [place] to obtain a screening vitamin D level. I recommended she go ahead and start 800 units of vitamin D for this will be beneficial anyway for her age.  History of statin therapy She reports she was previously on statin she does not remember which one she remembers having some muscle aches and it was  discontinued she does not think she tried a different one.  Risk for coronary artery disease between 10% and 20% in next 10 years Will start trial of Pravastatin 20mg  daily.  Encounter for hepatitis C virus screening test for high risk patient - Hep C  screening done- negative.   Medications Ordered Meds ordered this encounter  Medications  . naproxen sodium (ALEVE) 220 MG tablet    Sig: Take 1 tablet (220 mg total) by mouth 2 (two) times daily with a meal.    Dispense:  60 tablet  . pravastatin (PRAVACHOL) 20 MG tablet    Sig: Take 1 tablet (20 mg total) by mouth every evening.    Dispense:  30 tablet    Refill:  11   Other Orders Orders Placed This Encounter  Procedures  . Hepatitis C antibody  . Lipid Profile  . BMP8+Anion Gap   Follow Up: Return in about 6 months (around 02/08/2017).

## 2016-08-10 NOTE — Therapy (Signed)
[place], [place], [address] Phone: [phone]   Fax:  [phone]  Physical Therapy Treatment  Patient Details  Name: Eileen Cameron MRN: [id number] Date of Birth: October 03, 1951 Referring Provider: Jule Ser DO   Encounter Date: 08/09/2016      PT End of Session - 08/09/16 1627    Visit Number 2   Number of Visits 16   Date for PT Re-Evaluation 09/29/16   Authorization Type [employer]    PT Start Time 0950   PT Stop Time 1030   PT Time Calculation (min) 40 min   Activity Tolerance Patient tolerated treatment well   Behavior During Therapy [facility] for tasks assessed/performed      Past Medical History:  Diagnosis Date  . Allergy    seasonal allergies, ace inhibitors--angioedema  . Hyperlipidemia   . Hypertension     Past Surgical History:  Procedure Laterality Date  . COLONOSCOPY N/A 01/08/2013   Procedure: COLONOSCOPY;  Surgeon: Arta Silence, MD;  Location: [facility];  Service: Endoscopy;  Laterality: N/A;  . ECTOPIC PREGNANCY SURGERY  1970s  . right breast abscess drainage    . TONSILLECTOMY    . TUBAL LIGATION      There were no vitals filed for this visit.      Subjective Assessment - 08/09/16 0953    Subjective Patient reports some days it has been good somedays it has been bad. Saturday she felt like her pain was so bad that she had to go to the emergency room. The only thing she can think of on Saturday was she was sitting in a chair all day. Patient was 10 minutes late for her appointment but therapy has no-one after her .    Limitations Standing;Sitting   How long can you sit comfortably? >10 minutes    How long can you stand comfortably? > 15 minutes    How long can you walk comfortably? walking does not hurt her back too bad    Currently in Pain? Yes   Pain Score 3    Pain Location Back   Pain Orientation Mid;Lower   Pain Descriptors / Indicators Constant;Aching;Dull   Pain Type Acute pain   Pain Onset 1 to 4 weeks ago   Pain Frequency Constant   Aggravating Factors  sitting, standing    Pain Relieving Factors walking    Effect of Pain on Daily Activities difficulty standing for a long period of time                          [facility] Adult PT Treatment/Exercise - 08/10/16 0001      Lumbar Exercises: Stretches   Single Knee to Chest Stretch Limitations 2x30seconds    Lower Trunk Rotation Limitations x10   Piriformis Stretch Limitations 2x30sec      Lumbar Exercises: Supine   AB Set Limitations x10   Clam Limitations 2x10 red    Bent Knee Raise Limitations 2x10      Manual Therapy   Manual therapy comments Posterior capsule stretching of the  right hip to increase right hip flexion ; STM to lumbar spine to improve mobility                 PT Education - 08/09/16 1624    Education provided Yes   Education Details Good tolerance to ther-ex    [employer]) Educated Patient   Methods Explanation;Demonstration   Comprehension Verbalized understanding;Returned demonstration  PT Short Term Goals - 08/09/16 1631      PT SHORT TERM GOAL #1   Title  Patient will demsotrate a good core contraction    Baseline still needs cuing    Time 8   Period Weeks   Status On-going     PT SHORT TERM GOAL #2   Title Patient will increase bilateral hip flexion to 110 degrees    Time 8   Period Weeks   Status On-going     PT SHORT TERM GOAL #3   Title Patient will report 2/10 pain at worst    Time 8   Period Weeks   Status On-going     PT SHORT TERM GOAL #4   Title Patient will report decreased tenderness to palpation on the right side of the lumbar spine   Time 8   Period Weeks   Status On-going           PT Long Term Goals - 08/04/16 1220      PT LONG TERM GOAL #1   Title Patient will sit for 1 hour without increased pain in order to ride in a car    Time 8   Period Weeks   Status New     PT LONG TERM GOAL  #2   Title Patient will stand for 1 hour without increased pain in order to perfrom IADl's    Time 8   Period Weeks   Status New     PT LONG TERM GOAL #3   Title Patient will be independent with HEP    Time 8   Period Weeks   Status New               Plan - 08/09/16 1628    Clinical Impression Statement Patiens right hip flexion continues to be limited and continues to reproduce her pain. With mobilization she can move furter. She was given 3 exercises to work on today. She was having some pain lying supine so she was moved into a prone position. She had some spasming in her lumbar spine.    Rehab Potential Good   PT Frequency 2x / week   PT Duration 8 weeks   PT Treatment/Interventions ADLs/Self Care Home Management;Electrical Stimulation;Iontophoresis 4mg /ml Dexamethasone;Cryotherapy;Stair training;Functional mobility training;Passive range of motion;Patient/family education;Gait training;Neuromuscular re-education;Therapeutic activities;Therapeutic exercise;Moist Heat;Traction;Ultrasound;Manual techniques;Taping;Dry needling   PT Next Visit Plan Continue with manual therapy to decrease muscle spasms in the lumbar spine and increase hip flexion. Assess tolerance to stretches. Consider adding LTR, Hip abduction with t-band, Supine marching in pain free range.    PT Home Exercise Plan Single knee to chest stretch, Piriformis stretch    Consulted and Agree with Plan of Care Patient      Patient will benefit from skilled therapeutic intervention in order to improve the following deficits and impairments:  Increased fascial restricitons, Increased muscle spasms, Abnormal gait, Decreased mobility, Decreased knowledge of use of DME, Improper body mechanics, Decreased activity tolerance, Decreased endurance, Decreased strength  Visit Diagnosis: Chronic bilateral low back pain without sciatica  Muscle spasm of back     Problem List Patient Active Problem List   Diagnosis Date  Noted  . Encounter for immunization 07/18/2016  . Right shoulder pain 04/20/2016  . Contact dermatitis 04/20/2016  . Left knee pain 01/19/2016  . Leg cramps 11/18/2015  . Adrenal incidentaloma ([place]) 07/08/2015  . GERD (gastroesophageal reflux disease) 11/28/2014  . Insomnia 09/18/2014  . Seasonal allergies 12/31/2013  . Preventative health  care 08/26/2012  . Essential hypertension 04/01/2009  . Lumbar back pain with radiculopathy affecting right lower extremity 04/01/2009    Carney Living PT DPT  08/10/2016, 7:52 AM  [place], [place], [address] Phone: [phone]   Fax:  [phone]  Name: Chae Holohan MRN: [id number] Date of Birth: [birth year]

## 2016-08-10 NOTE — Patient Instructions (Signed)
I want you to start taking OTC Aleve 1 pill twice a day. Please continue with physical therapy. I am going to check your cholesterol.

## 2016-08-11 ENCOUNTER — Ambulatory Visit: Payer: Medicare Other | Admitting: Physical Therapy

## 2016-08-11 DIAGNOSIS — M545 Low back pain, unspecified: Secondary | ICD-10-CM

## 2016-08-11 DIAGNOSIS — M62830 Muscle spasm of back: Secondary | ICD-10-CM

## 2016-08-11 DIAGNOSIS — Z9189 Other specified personal risk factors, not elsewhere classified: Secondary | ICD-10-CM | POA: Insufficient documentation

## 2016-08-11 DIAGNOSIS — G8929 Other chronic pain: Secondary | ICD-10-CM

## 2016-08-11 LAB — BMP8+ANION GAP
Anion Gap: 18 mmol/L (ref 10.0–18.0)
BUN / CREAT RATIO: 15 (ref 12–28)
BUN: 14 mg/dL (ref 8–27)
CALCIUM: 9.8 mg/dL (ref 8.7–10.3)
CO2: 24 mmol/L (ref 18–29)
Chloride: 99 mmol/L (ref 96–106)
Creatinine, Ser: 0.93 mg/dL (ref 0.57–1.00)
GFR, EST AFRICAN AMERICAN: 74 mL/min/{1.73_m2} (ref >59)
GFR, EST NON AFRICAN AMERICAN: 64 mL/min/{1.73_m2} (ref >59)
Glucose: 92 mg/dL (ref 65–99)
POTASSIUM: 4.4 mmol/L (ref 3.5–5.2)
SODIUM: 141 mmol/L (ref 134–144)

## 2016-08-11 LAB — HEPATITIS C ANTIBODY: Hep C Virus Ab: <0.1 s/co ratio (ref 0.0–0.9)

## 2016-08-11 LAB — LIPID PANEL
CHOL/HDL RATIO: 3.9 ratio (ref 0.0–4.4)
Cholesterol, Total: 189 mg/dL (ref 100–199)
HDL: 48 mg/dL (ref >39)
LDL CALC: 116 mg/dL — AB (ref 0–99)
TRIGLYCERIDES: 127 mg/dL (ref 0–149)
VLDL CHOLESTEROL CAL: 25 mg/dL (ref 5–40)

## 2016-08-11 MED ORDER — PRAVASTATIN SODIUM 20 MG PO TABS: 20.0000 mg | ORAL_TABLET | Freq: Every evening | ORAL | 11 refills | Status: DC

## 2016-08-11 NOTE — Assessment & Plan Note (Signed)
History of present illness: Reports this is well controlled with Flonase daily.   Assessment: Seasonal allergies  Plan: Continue Flonase

## 2016-08-11 NOTE — Assessment & Plan Note (Signed)
History of present illness: She's tried many different insomnia medications in the past. She currently finds that melatonin appears to work well.  Assessment insomnia  Plan: Continue melatonin

## 2016-08-11 NOTE — Therapy (Addendum)
[place], [place], [address] Phone: [phone]   Fax:  [phone]  Physical Therapy Treatment  Patient Details  Name: Eileen Cameron MRN: [id number] Date of Birth: 23-Dec-1950 Referring Provider: Jule Ser DO   Encounter Date: 08/11/2016      PT End of Session - 08/11/16 0944    Visit Number 3   Number of Visits 16   Date for PT Re-Evaluation 09/29/16   Authorization Type [employer]    PT Start Time 0933   PT Stop Time 1013   PT Time Calculation (min) 40 min   Activity Tolerance Patient tolerated treatment well   Behavior During Therapy [facility] for tasks assessed/performed      Past Medical History:  Diagnosis Date  . Allergy    seasonal allergies, ace inhibitors--angioedema  . Hyperlipidemia   . Hypertension     Past Surgical History:  Procedure Laterality Date  . COLONOSCOPY N/A 01/08/2013   Procedure: COLONOSCOPY;  Surgeon: Arta Silence, MD;  Location: [facility];  Service: Endoscopy;  Laterality: N/A;  . ECTOPIC PREGNANCY SURGERY  1970s  . right breast abscess drainage    . TONSILLECTOMY    . TUBAL LIGATION      There were no vitals filed for this visit.      Subjective Assessment - 08/11/16 0936    Subjective Patient rpeorts over the past few days she feels like she is back to her old self. She didbend down this morning to get some shoes and felt a little pulling.    Limitations Standing;Sitting   How long can you sit comfortably? >10 minutes    How long can you stand comfortably? > 15 minutes    How long can you walk comfortably? walking does not hurt her back too bad    Diagnostic tests Nothing for the lower back    Currently in Pain? Yes   Pain Score 0-No pain   Pain Location Back   Pain Orientation Mid;Lower   Pain Descriptors / Indicators Constant;Aching;Dull   Pain Type Acute pain   Pain Onset 1 to 4 weeks ago   Pain Frequency Constant   Aggravating Factors  sitting,  standing    Pain Relieving Factors walking    Effect of Pain on Daily Activities sitting for long periods                          [facility] Adult PT Treatment/Exercise - 08/11/16 0001      Lumbar Exercises: Stretches   Single Knee to Chest Stretch Limitations 2x30seconds    Lower Trunk Rotation Limitations x10   Piriformis Stretch Limitations 2x30sec      Lumbar Exercises: Supine   AB Set Limitations x10   Clam Limitations 2x10 red    Bent Knee Raise Limitations 2x10    Other Supine Lumbar Exercises Thomas stretch                 PT Education - 08/11/16 [phone]    Education provided Yes   Education Details Improtance of core stability, hamstring stretch    Person(s) Educated Patient   Methods Explanation;Demonstration;Handout   Comprehension Verbalized understanding;Returned demonstration          PT Short Term Goals - 08/11/16 1034      PT SHORT TERM GOAL #1   Title  Patient will demsotrate a good core contraction    Baseline improved today    Time 8  Period Weeks   Status On-going     PT SHORT TERM GOAL #2   Title Patient will increase bilateral hip flexion to 110 degrees    Time 8   Period Weeks   Status On-going     PT SHORT TERM GOAL #3   Title Patient will report 2/10 pain at worst    Time 8   Period Weeks   Status On-going     PT SHORT TERM GOAL #4   Title Patient will report decreased tenderness to palpation on the right side of the lumbar spine   Time 8   Period Weeks   Status On-going           PT Long Term Goals - 08/04/16 1220      PT LONG TERM GOAL #1   Title Patient will sit for 1 hour without increased pain in order to ride in a car    Time 8   Period Weeks   Status New     PT LONG TERM GOAL #2   Title Patient will stand for 1 hour without increased pain in order to perfrom IADl's    Time 8   Period Weeks   Status New     PT LONG TERM GOAL #3   Title Patient will be independent with HEP    Time 8    Period Weeks   Status New               Plan - 2016/08/25 0947    Clinical Impression Statement Patient is making great progress. Her hip mobility on the right has improved significantly. She has less spasming today compared to the last visit. She will come for 1 more visit. If she remains pain free she will likely discharge.    Rehab Potential Good   PT Frequency 2x / week   PT Duration 8 weeks   PT Treatment/Interventions ADLs/Self Care Home Management;Electrical Stimulation;Iontophoresis 4m/ml Dexamethasone;Cryotherapy;Stair training;Functional mobility training;Passive range of motion;Patient/family education;Gait training;Neuromuscular re-education;Therapeutic activities;Therapeutic exercise;Moist Heat;Traction;Ultrasound;Manual techniques;Taping;Dry needling   PT Next Visit Plan Continue with manual therapy to decrease muscle spasms in the lumbar spine and increase hip flexion. Assess tolerance to stretches. Consider adding LTR, Hip abduction with t-band, Supine marching in pain free range.    PT Home Exercise Plan Single knee to chest stretch, Piriformis stretch    Consulted and Agree with Plan of Care Patient      Patient will benefit from skilled therapeutic intervention in order to improve the following deficits and impairments:  Increased fascial restricitons, Increased muscle spasms, Abnormal gait, Decreased mobility, Decreased knowledge of use of DME, Improper body mechanics, Decreased activity tolerance, Decreased endurance, Decreased strength  Visit Diagnosis: Chronic bilateral low back pain without sciatica  Muscle spasm of back PHYSICAL THERAPY DISCHARGE SUMMARY  Visits from Start of Care: 3  Current functional level related to goals / functional outcomes: Patient had reported significant improvement in pain. She was going to go home and work on her exercises. If she continued to hve no pain she was not going to return. D/C to HEP    Remaining deficits: Unknown     Education / Equipment:  Plan: Patient agrees to discharge.  Patient goals were met. Patient is being discharged due to meeting the stated rehab goals.  ?????          G-Codes - [id number]    Functional Assessment Tool Used FOTO, clinical decision making    Mobility: Walking and Moving Around  Goal Status [phone]) At least 1 percent but less than 20 percent impaired, limited or restricted      Problem List Patient Active Problem List   Diagnosis Date Noted  . Encounter for hepatitis C virus screening test for high risk patient 08/10/2016  . Statin intolerance 08/10/2016  . Encounter for immunization 07/18/2016  . Right shoulder pain 04/20/2016  . Contact dermatitis 04/20/2016  . Leg cramps 11/18/2015  . Adrenal incidentaloma ([place]) 07/08/2015  . GERD (gastroesophageal reflux disease) 11/28/2014  . Insomnia 09/18/2014  . Seasonal allergies 12/31/2013  . Preventative health care 08/26/2012  . Essential hypertension 04/01/2009  . Lumbar back pain with radiculopathy affecting right lower extremity 04/01/2009    Carney Living PT DPT  08/11/2016, 11:34 AM  [place] [employer], [place], [address] Phone: [phone]   Fax:  [phone]  Name: Joanie Duprey MRN: [id number] Date of Birth: 03/13/1951

## 2016-08-11 NOTE — Assessment & Plan Note (Signed)
She reports she was previously on statin she does not remember which one she remembers having some muscle aches and it was discontinued she does not think she tried a different one.

## 2016-08-11 NOTE — Assessment & Plan Note (Addendum)
History of present illness: Tolerating Benicar no complaints.  Assessment: Essential hypertension well controlled  Plan: Updated BP goal to less than 150/90 due to age. Continue Benicar 40-25 daily

## 2016-08-11 NOTE — Assessment & Plan Note (Signed)
History of present illness: She has a history of some leg cramps which she reports is worse when her potassium was low. She also reports a history of intolerance to statins she does not remember the name statin and unfortunately there are no statins in our electronic health record for her. She still occasionally has some minor leg cramps but overall is doing very well.  Assessment: Leg cramps  Plan: We'll check a BMP I am somewhat concerned that she could have some vitamin D deficiency which can lead to dose statin intolerance as well as some leg cramps unfortunately she does not appear to have a diagnosis that would allow [place] to obtain a screening vitamin D level. I recommended she go ahead and start 800 units of vitamin D for this will be beneficial anyway for her age.

## 2016-08-11 NOTE — Assessment & Plan Note (Signed)
-   Hep C screening done- negative.

## 2016-08-11 NOTE — Assessment & Plan Note (Addendum)
History of present illness: She was seen about 3 weeks ago for worsening of her low back pain believed to be MSK strain. She was prescribed meloxicam however she did not pick it up because when she gets the pharmacy she remembered that it previously had made her nauseous. She has however started physical therapy and is already appreciated improvement in her low back pain.  Assessment: Lumbar back pain with mild radiculopathy of right lower extremity  Plan: Continue physical therapy -Start OTC Aleve BID.

## 2016-08-11 NOTE — Assessment & Plan Note (Addendum)
Will start trial of Pravastatin 20mg  daily.

## 2016-08-23 ENCOUNTER — Encounter: Payer: Medicare Other | Admitting: Physical Therapy

## 2016-09-29 ENCOUNTER — Other Ambulatory Visit: Payer: Self-pay | Admitting: Internal Medicine

## 2016-09-29 DIAGNOSIS — I10 Essential (primary) hypertension: Secondary | ICD-10-CM

## 2016-10-09 DIAGNOSIS — K1379 Other lesions of oral mucosa: Secondary | ICD-10-CM | POA: Diagnosis not present

## 2016-10-10 ENCOUNTER — Encounter (INDEPENDENT_AMBULATORY_CARE_PROVIDER_SITE_OTHER): Payer: Self-pay

## 2016-10-10 ENCOUNTER — Ambulatory Visit (INDEPENDENT_AMBULATORY_CARE_PROVIDER_SITE_OTHER): Payer: Medicare Other | Admitting: Internal Medicine

## 2016-10-10 ENCOUNTER — Encounter: Payer: Self-pay | Admitting: Internal Medicine

## 2016-10-10 DIAGNOSIS — R0982 Postnasal drip: Secondary | ICD-10-CM

## 2016-10-10 DIAGNOSIS — R0989 Other specified symptoms and signs involving the circulatory and respiratory systems: Secondary | ICD-10-CM

## 2016-10-10 HISTORY — DX: Postnasal drip: R09.82

## 2016-10-10 NOTE — Assessment & Plan Note (Signed)
Patient reports URI symptoms around Thanksgiving including sore throat, hoarseness, and purulent rhinorrhea which have now improved. She has residual mucus production in the back of her throat and frequent "honking" to clear the mucus. It feels like mucus dripping down the back of her throat. She has associated itching in her ears. She denies any actual cough, fevers, chills, rhinorrhea, lacrimation, sinus tenderness, heartburn or reflex, chest pain, shortness of breath, nausea, vomiting, diarrhea, or myalgias.  She has been using Flonase daily with some relief. Symptoms are not related to meals or position.  Patient's symptoms consistent with post-nasal drip occurring after recent viral URI. She is advised on supportive therapy and that there is no indication for antibiotics as this does not seem bacterial in nature. She understands and agrees. -Continue Flonase daily -Start nasal saline spray/irrigation frequently during the day -Try OTC antihistamines and cough drops

## 2016-10-10 NOTE — Patient Instructions (Signed)
It was a pleasure to meet you Ms. Wessler.  It sounds like you are having post-nasal drip causing your throat symptoms after a recent upper respiratory viral infection.  Please continue to use Flonase daily. Try to use saline nasal spray or irrigation like a Neti-Pot throughout the day to help clear the throat and nasal passageways.  You can try over the counter antihistamines like Claritin or Zyrtec as well. Use cough drops for sore throat. Drink plenty of fluids to stay hydrated.  Please let us know if your symptoms worsen or you begin to have fevers, chills, or difficulty breathing.

## 2016-10-10 NOTE — Progress Notes (Signed)
   CC: Post-nasal drip  HPI:  Eileen Cameron is a 65 y.o. female with PMH of HTN, GERD, and seasonal allergies who presents for management of post-nasal drip.  Patient reports URI symptoms around Thanksgiving including sore throat, hoarseness, and purulent rhinorrhea which have now improved. She has residual mucus production in the back of her throat and frequent "honking" to clear the mucus. It feels like mucus dripping down the back of her throat. She has associated itching in her ears. She denies any actual cough, fevers, chills, rhinorrhea, lacrimation, sinus tenderness, heartburn or reflex, chest pain, shortness of breath, nausea, vomiting, diarrhea, or myalgias.  She has been using Flonase daily with some relief. Symptoms are not related to meals or position.    Past Medical History:  Diagnosis Date  . Allergy    seasonal allergies, ace inhibitors--angioedema  . Hyperlipidemia   . Hypertension     Review of Systems:   Review of Systems  Constitutional: Negative for chills, diaphoresis and fever.  HENT: Positive for sore throat. Negative for ear discharge and sinus pain.   Respiratory: Positive for sputum production. Negative for cough, hemoptysis, shortness of breath and wheezing.   Cardiovascular: Negative for chest pain.  Gastrointestinal: Negative for constipation, diarrhea, nausea and vomiting.  Genitourinary: Negative for dysuria.  Musculoskeletal: Negative for falls and myalgias.  Neurological: Negative for loss of consciousness and weakness.     Physical Exam:  Vitals:   10/10/16 1413  BP: (!) 115/55  Pulse: 79  Temp: 98.2 F (36.8 C)  TempSrc: Oral  SpO2: 99%  Weight: 186 lb 8 oz (84.6 kg)  Height: 5\' 3"  (1.6 m)   Physical Exam  Constitutional: She is oriented to person, place, and time. She appears well-developed and well-nourished. No distress.  HENT:  Head: Normocephalic and atraumatic.  Right Ear: Tympanic membrane and ear canal normal.  Left  Ear: Tympanic membrane and ear canal normal.  Mouth/Throat: Oropharynx is clear and moist. No oropharyngeal exudate.  No sinus tenderness on palpation.  Neck: Neck supple. No tracheal deviation present.  Cardiovascular: Normal rate and regular rhythm.   Pulmonary/Chest: Effort normal. No respiratory distress. She has no wheezes. She has no rales.  Lymphadenopathy:    She has no cervical adenopathy.  Neurological: She is alert and oriented to person, place, and time.  Skin: She is not diaphoretic.    Assessment & Plan:   See Encounters Tab for problem based charting.  Patient discussed with Dr. Lynnae January

## 2016-10-11 NOTE — Progress Notes (Signed)
Internal Medicine Clinic Attending  Case discussed with Dr. Patel,Vishal at the time of the visit.  We reviewed the resident's history and exam and pertinent patient test results.  I agree with the assessment, diagnosis, and plan of care documented in the resident's note.  

## 2016-10-23 ENCOUNTER — Telehealth: Payer: Self-pay

## 2016-10-23 NOTE — Telephone Encounter (Signed)
Needs to speak with a nurse regarding congestion.

## 2016-10-24 ENCOUNTER — Ambulatory Visit: Payer: Medicare Other

## 2016-10-24 NOTE — Telephone Encounter (Signed)
Agree with appointment for evaluation.

## 2016-10-24 NOTE — Telephone Encounter (Signed)
Patient called reports she is coughing up green mucus has talked to several people who have the same symptoms and antibiotics helped them requesting ABT. Advised patient that ABT treatment may or may not be indicated she would need to come in for visit to be assessed so that can receive the best treatment for her symptoms. Appointment scheduled for 945 on tomorow

## 2016-10-25 ENCOUNTER — Ambulatory Visit (INDEPENDENT_AMBULATORY_CARE_PROVIDER_SITE_OTHER): Payer: Medicare Other | Admitting: Pulmonary Disease

## 2016-10-25 DIAGNOSIS — J3489 Other specified disorders of nose and nasal sinuses: Secondary | ICD-10-CM

## 2016-10-25 DIAGNOSIS — J0110 Acute frontal sinusitis, unspecified: Secondary | ICD-10-CM

## 2016-10-25 DIAGNOSIS — R05 Cough: Secondary | ICD-10-CM

## 2016-10-25 MED ORDER — FLUTICASONE PROPIONATE 50 MCG/ACT NA SUSP: 1.0000 | Freq: Every day | NASAL | 2 refills | Status: DC

## 2016-10-25 MED ORDER — AMOXICILLIN-POT CLAVULANATE 875-125 MG PO TABS: 1.0000 | ORAL_TABLET | Freq: Two times a day (BID) | ORAL | 0 refills | Status: AC

## 2016-10-25 NOTE — Assessment & Plan Note (Signed)
Assessment: Rhinorrhea and cough productive of thick green mucous. She has nasal congestion. She has tried conservative treatment at home. No fevers. Has been ongoing for over two weeks. Consistent with acute bacterial sinusitis.  Plan: Amoxicillin-clavulanate 875 mg/125 mg orally twice daily for 5 days OTC analgesics and antipyretics such as nonsteroidal anti-inflammatory drugs and acetaminophen can be used for pain and fever relief as needed  Guaifenesin +/- Dextromethorphan for cough Flonase daily Sinus rinses Return precautions discussed

## 2016-10-25 NOTE — Patient Instructions (Addendum)
Keep doing the sinus rinses Take your antibiotic twice a day for 5 days You may take over the counter acetaminophen or nonsteroidal anti-inflammatory drugs as needed for pain You may take guaifenesin to thin out your mucous You may take dextromethorphan to suppress your cough at night  Sinusitis, Adult Sinusitis is soreness and inflammation of your sinuses. Sinuses are hollow spaces in the bones around your face. They are located:  Around your eyes.  In the middle of your forehead.  Behind your nose.  In your cheekbones. Your sinuses and nasal passages are lined with a stringy fluid (mucus). Mucus normally drains out of your sinuses. When your nasal tissues get inflamed or swollen, the mucus can get trapped or blocked so air cannot flow through your sinuses. This lets bacteria, viruses, and funguses grow, and that leads to infection.  Follow these instructions at home: Medicines  Take, use, or apply over-the-counter and prescription medicines only as told by your doctor. These may include nasal sprays.  If you were prescribed an antibiotic medicine, take it as told by your doctor. Do not stop taking the antibiotic even if you start to feel better.  Hydrate and Humidify  Drink enough water to keep your pee (urine) clear or pale yellow.  Use a cool mist humidifier to keep the humidity level in your home above 50%.  Breathe in steam for 10-15 minutes, 3-4 times a day or as told by your doctor. You can do this in the bathroom while a hot shower is running.  Try not to spend time in cool or dry air.  Rest  Rest as much as possible.  Sleep with your head raised (elevated).  Make sure to get enough sleep each night.  General instructions  Put a warm, moist washcloth on your face 3-4 times a day or as told by your doctor. This will help with discomfort.  Wash your hands often with soap and water. If there is no soap and water, use hand sanitizer.  Do not smoke. Avoid being  around people who are smoking (secondhand smoke).  Keep all follow-up visits as told by your doctor. This is important.  Contact a doctor if:  You have a fever.  Your symptoms get worse.  Your symptoms do not get better within 10 days. Get help right away if:  You have a very bad headache.  You cannot stop throwing up (vomiting).  You have pain or swelling around your face or eyes.  You have trouble seeing.  You feel confused.  Your neck is stiff.  You have trouble breathing. This information is not intended to replace advice given to you by your health care provider. Make sure you discuss any questions you have with your health care provider. Document Released: 04/10/2008 Document Revised: 06/18/2016 Document Reviewed: 08/18/2015 Elsevier Interactive Patient Education  2017 [employer].

## 2016-10-25 NOTE — Progress Notes (Signed)
   CC: cough, sinus problems  HPI:  [name] is a 65 y.o. woman with seasonal allergies presenting with cough, sinus problems.  Symptoms started around Thanksgiving with sore throat, hoarseness, rhinorrhea. She still has rhinorrhea. Mild frontal sinus pressure. She has had some contacts including a grandson that has it. Yellow thick to green drainage from her nose. She coughs a lot. She coughs up the same color. She gets chills but no fevers.    Past Medical History:  Diagnosis Date  . Allergy    seasonal allergies, ace inhibitors--angioedema  . Hyperlipidemia   . Hypertension     Review of Systems:   No nausea/vomiting, diarrhea No dysuria  Physical Exam:  Vitals:   10/25/16 0956  BP: (!) 146/89  Pulse: 82  Temp: 97.9 F (36.6 C)  TempSrc: Oral  Weight: 182 lb 8 oz (82.8 kg)  Height: 5\' 3"  (1.6 m)   General Apperance: NAD HEENT: Normocephalic, atraumatic, anicteric sclera Neck: Supple, trachea midline Lungs: Clear to auscultation bilaterally. No wheezes, rhonchi or rales. Breathing comfortably Heart: Regular rate and rhythm, no murmur/rub/gallop Abdomen: Soft, nontender, nondistended, no rebound/guarding Extremities: Warm and well perfused, no edema Skin: No rashes or lesions Neurologic: Alert and interactive. No gross deficits.  Assessment & Plan:   See Encounters Tab for problem based charting.  Patient discussed with Dr. Dareen Piano

## 2016-10-26 NOTE — Progress Notes (Signed)
Internal Medicine Clinic Attending  Case discussed with Dr. Krall at the time of the visit.  We reviewed the resident's history and exam and pertinent patient test results.  I agree with the assessment, diagnosis, and plan of care documented in the resident's note.  

## 2016-12-01 ENCOUNTER — Other Ambulatory Visit: Payer: Self-pay | Admitting: Internal Medicine

## 2016-12-01 DIAGNOSIS — Z1231 Encounter for screening mammogram for malignant neoplasm of breast: Secondary | ICD-10-CM

## 2016-12-18 ENCOUNTER — Emergency Department (HOSPITAL_COMMUNITY): Payer: Medicare Other

## 2016-12-18 ENCOUNTER — Ambulatory Visit
Admission: RE | Admit: 2016-12-18 | Discharge: 2016-12-18 | Disposition: A | Discharge status: Home / Self Care | Payer: Medicare Other | Source: Ambulatory Visit | Attending: Internal Medicine | Admitting: Internal Medicine

## 2016-12-18 ENCOUNTER — Emergency Department (HOSPITAL_COMMUNITY)
Admission: EM | Admit: 2016-12-18 | Discharge: 2016-12-18 | Disposition: A | Discharge status: Home / Self Care | Payer: Medicare Other | Attending: Physician Assistant | Admitting: Physician Assistant

## 2016-12-18 ENCOUNTER — Encounter (HOSPITAL_COMMUNITY): Payer: Self-pay | Admitting: Emergency Medicine

## 2016-12-18 DIAGNOSIS — Z79899 Other long term (current) drug therapy: Secondary | ICD-10-CM | POA: Diagnosis not present

## 2016-12-18 DIAGNOSIS — R51 Headache: Secondary | ICD-10-CM | POA: Insufficient documentation

## 2016-12-18 DIAGNOSIS — Z1231 Encounter for screening mammogram for malignant neoplasm of breast: Secondary | ICD-10-CM | POA: Diagnosis not present

## 2016-12-18 DIAGNOSIS — I10 Essential (primary) hypertension: Secondary | ICD-10-CM | POA: Diagnosis not present

## 2016-12-18 DIAGNOSIS — R519 Headache, unspecified: Secondary | ICD-10-CM

## 2016-12-18 MED ORDER — ACETAMINOPHEN 325 MG PO TABS
650.0000 mg | ORAL_TABLET | Freq: Once | ORAL | Status: AC
  Administered 2016-12-18: 650 mg via ORAL
  Filled 2016-12-18: qty 2

## 2016-12-18 MED ORDER — IBUPROFEN 400 MG PO TABS
600.0000 mg | ORAL_TABLET | Freq: Once | ORAL | Status: AC
  Administered 2016-12-18: 600 mg via ORAL
  Filled 2016-12-18: qty 1

## 2016-12-18 NOTE — ED Notes (Signed)
Patient states that she has been having a headache x 2 weeks that comes and goes.  Denies fever, numbness/tingling, or vision issues.  Denies ear pain.

## 2016-12-18 NOTE — ED Notes (Signed)
On way to CT 

## 2016-12-18 NOTE — ED Triage Notes (Signed)
Pt sts right sided HA x 2 weeks into neck

## 2016-12-18 NOTE — Discharge Instructions (Signed)
Take tylenol or motrin for headache. Rest. Follow up with family doctor if not improving. Return if any changes in vision, dizziness, vomiting, any new numbness or weakness in extremities.

## 2016-12-18 NOTE — ED Provider Notes (Signed)
[facility] DEPT Provider Note   CSN: [id number] Arrival date & time: 12/18/16  1100     History   Chief Complaint Chief Complaint  Patient presents with  . Headache    HPI Eileen Cameron is a 66 y.o. female.  HPI Eileen Cameron is a 66 y.o. female with history of seasonal allergies and hypertension, presents to emergency department complaining of right-sided headache. Patient reports headache for approximately 2 weeks. Reports gradual onset, pressure-like pain that comes and goes throughout the day. She states pain is mainly on the right side and radiates into the right face and starting today radiating down right back of the neck. She states she took ibuprofen yesterday which has helped. She also reports calling 911 3 days ago because "I was feeling shaky." She was told that she could go to the emergency department if she wanted to get checked out, but patient states she felt okay and did not go. Patient states she went to get a mammogram today across the street from emergency department decided to come over to get checked out. She denies any blurred vision. No nausea or vomiting. No dizziness. No numbness or weakness in extremities. No difficulty ambulating. She reports no other complaints.  Past Medical History:  Diagnosis Date  . Allergy    seasonal allergies, ace inhibitors--angioedema  . Hyperlipidemia   . Hypertension     Patient Active Problem List   Diagnosis Date Noted  . Acute non-recurrent frontal sinusitis 10/25/2016  . Post-nasal drip 10/10/2016  . Risk for coronary artery disease between 10% and 20% in next 10 years 08/11/2016  . History of statin therapy 08/10/2016  . Encounter for immunization 07/18/2016  . Right shoulder pain 04/20/2016  . Contact dermatitis 04/20/2016  . Leg cramps 11/18/2015  . Adrenal incidentaloma ([place]) 07/08/2015  . GERD (gastroesophageal reflux disease) 11/28/2014  . Insomnia 09/18/2014  . Seasonal allergies 12/31/2013  .  Preventative health care 08/26/2012  . Essential hypertension 04/01/2009  . Lumbar back pain with radiculopathy affecting right lower extremity 04/01/2009    Past Surgical History:  Procedure Laterality Date  . COLONOSCOPY N/A 01/08/2013   Procedure: COLONOSCOPY;  Surgeon: Arta Silence, MD;  Location: [facility];  Service: Endoscopy;  Laterality: N/A;  . ECTOPIC PREGNANCY SURGERY  1970s  . right breast abscess drainage    . TONSILLECTOMY    . TUBAL LIGATION      OB History    No data available       Home Medications    Prior to Admission medications   Medication Sig Start Date End Date Taking? Authorizing Provider  diclofenac sodium (VOLTAREN) 1 % GEL Apply 2 g topically 4 (four) times daily. 04/20/16   Liberty Handy, MD  esomeprazole (NEXIUM) 40 MG capsule Take 1 capsule (40 mg total) by mouth daily. 07/18/16 07/18/17  Jule Ser, DO  fluticasone (FLONASE) 50 MCG/ACT nasal spray Place 1 spray into both nostrils daily. 10/25/16   Milagros Loll, MD  Melatonin 3 MG TABS Take 3 mg by mouth at bedtime.    Historical Provider, MD  naproxen sodium (ALEVE) 220 MG tablet Take 1 tablet (220 mg total) by mouth 2 (two) times daily with a meal. 08/10/16   Lucious Groves, DO  olmesartan-hydrochlorothiazide (BENICAR HCT) 40-25 MG tablet take 1 tablet by mouth once daily 10/03/16   Lucious Groves, DO  pravastatin (PRAVACHOL) 20 MG tablet Take 1 tablet (20 mg total) by mouth every evening. 08/11/16 08/11/17  Lucious Groves,  DO  triamcinolone cream (KENALOG) 0.1 % Apply 1 application topically 2 (two) times daily as needed (eczema). 04/20/16   Liberty Handy, MD    Family History Family History  Problem Relation Age of Onset  . Heart disease Mother 77  . Diabetes Mother   . Hypertension Sister   . Heart disease Maternal Grandmother     Social History Social History  Substance Use Topics  . Smoking status: Never Smoker  . Smokeless tobacco: Not on file  . Alcohol use No     Allergies    Ace inhibitors   Review of Systems Review of Systems  Constitutional: Negative for chills and fever.  Respiratory: Negative for cough, chest tightness and shortness of breath.   Cardiovascular: Negative for chest pain, palpitations and leg swelling.  Gastrointestinal: Negative for abdominal pain, diarrhea, nausea and vomiting.  Genitourinary: Negative for dysuria, flank pain and pelvic pain.  Musculoskeletal: Negative for arthralgias, myalgias, neck pain and neck stiffness.  Skin: Negative for rash.  Neurological: Positive for headaches. Negative for dizziness and weakness.  All other systems reviewed and are negative.    Physical Exam Updated Vital Signs BP 141/85 (BP Location: Right Arm)   Pulse 87   Temp 98.3 F (36.8 C) (Oral)   Resp 18   SpO2 100%   Physical Exam  Constitutional: She is oriented to person, place, and time. She appears well-developed and well-nourished. No distress.  HENT:  Head: Normocephalic.  Mild ttp over right scalp with no rashes, swelling, bruising. No ttp over right temple or TMJ. No trismus.   Eyes: Conjunctivae and EOM are normal. Pupils are equal, round, and reactive to light.  Neck: Normal range of motion. Neck supple.  Cardiovascular: Normal rate, regular rhythm and normal heart sounds.   Pulmonary/Chest: Effort normal and breath sounds normal. No respiratory distress. She has no wheezes. She has no rales.  Abdominal: Soft. Bowel sounds are normal. She exhibits no distension. There is no tenderness. There is no rebound.  Musculoskeletal: She exhibits no edema.  Neurological: She is alert and oriented to person, place, and time.  5/5 and equal upper and lower extremity strength bilaterally. Equal grip strength bilaterally. Normal finger to nose and heel to shin. No pronator drift. Normal gait  Skin: Skin is warm and dry.  Psychiatric: She has a normal mood and affect. Her behavior is normal.  Nursing note and vitals reviewed.    ED  Treatments / Results  Labs (all labs ordered are listed, but only abnormal results are displayed) Labs Reviewed - No data to display  EKG  EKG Interpretation None       Radiology Ct Head Wo Contrast  Result Date: 12/18/2016 CLINICAL DATA:  Right posterior headache on and off for 2 weeks. EXAM: CT HEAD WITHOUT CONTRAST TECHNIQUE: Contiguous axial images were obtained from the base of the skull through the vertex without intravenous contrast. COMPARISON:  09/26/2015 FINDINGS: Brain: No evidence of acute or remote infarction, hemorrhage, hydrocephalus, extra-axial collection or mass lesion/mass effect. Vascular: No hyperdense vessel or unexpected calcification. Skull: Normal. Negative for fracture or focal lesion. Sinuses/Orbits: No acute finding. IMPRESSION: Stable and negative head CT. Electronically Signed   By: Monte Fantasia M.D.   On: 12/18/2016 13:00    Procedures Procedures (including critical care time)  Medications Ordered in ED Medications - No data to display   Initial Impression / Assessment and Plan / ED Course  I have reviewed the triage vital signs and the nursing notes.  Pertinent labs & imaging results that were available during my care of the patient were reviewed by me and considered in my medical decision making (see chart for details).     pt in ED with right sided headache. Patient is in no acute distress. Mild scalp tenderness to the right parietal region. There is no tenderness over right temple. Low concern for temporal arteritis.  Normal neurological exam otherwise. No TMJ tenderness. No trismus. CT of the head obtained and is negative. Patient was given Tylenol and Motrin for pain. She does not appear to be in any pain, she is joking, smiling, laughing. Stable to dc home with close outpatient follow up.   Vitals:   12/18/16 1230 12/18/16 1345 12/18/16 1400 12/18/16 1415  BP: 145/82 120/70 137/80 142/68  Pulse: 76 67 68 71  Resp:      Temp:        TempSrc:      SpO2: 95% 100% 100% 97%     Final Clinical Impressions(s) / ED Diagnoses   Final diagnoses:  Nonintractable headache, unspecified chronicity pattern, unspecified headache type    New Prescriptions Discharge Medication List as of 12/18/2016  2:08 PM       Jeannett Senior, PA-C 12/18/16 [place], MD 12/18/16 1529

## 2017-02-03 ENCOUNTER — Encounter (HOSPITAL_COMMUNITY): Payer: Self-pay | Admitting: *Deleted

## 2017-02-03 ENCOUNTER — Emergency Department (HOSPITAL_COMMUNITY)
Admission: EM | Admit: 2017-02-03 | Discharge: 2017-02-03 | Disposition: A | Discharge status: Home / Self Care | Payer: Medicare Other | Attending: Emergency Medicine | Admitting: Emergency Medicine

## 2017-02-03 ENCOUNTER — Emergency Department (HOSPITAL_COMMUNITY): Payer: Medicare Other

## 2017-02-03 DIAGNOSIS — I10 Essential (primary) hypertension: Secondary | ICD-10-CM | POA: Diagnosis not present

## 2017-02-03 DIAGNOSIS — R531 Weakness: Secondary | ICD-10-CM | POA: Insufficient documentation

## 2017-02-03 DIAGNOSIS — R05 Cough: Secondary | ICD-10-CM | POA: Diagnosis not present

## 2017-02-03 DIAGNOSIS — M79602 Pain in left arm: Secondary | ICD-10-CM

## 2017-02-03 DIAGNOSIS — M79622 Pain in left upper arm: Secondary | ICD-10-CM | POA: Diagnosis not present

## 2017-02-03 LAB — CBC WITH DIFFERENTIAL/PLATELET
BASOS ABS: 0 10*3/uL (ref 0.0–0.1)
Basophils Relative: 0 %
EOS ABS: 0.1 10*3/uL (ref 0.0–0.7)
EOS PCT: 1 %
HEMATOCRIT: 40 % (ref 36.0–46.0)
Hemoglobin: 13.3 g/dL (ref 12.0–15.0)
Lymphocytes Relative: 37 %
Lymphs Abs: 2.3 10*3/uL (ref 0.7–4.0)
MCH: 29.7 pg (ref 26.0–34.0)
MCHC: 33.3 g/dL (ref 30.0–36.0)
MCV: 89.3 fL (ref 78.0–100.0)
MONO ABS: 0.5 10*3/uL (ref 0.1–1.0)
Monocytes Relative: 7 %
Neutro Abs: 3.4 10*3/uL (ref 1.7–7.7)
Neutrophils Relative %: 55 %
Platelets: 253 10*3/uL (ref 150–400)
RBC: 4.48 MIL/uL (ref 3.87–5.11)
RDW: 13.7 % (ref 11.5–15.5)
WBC: 6.2 10*3/uL (ref 4.0–10.5)

## 2017-02-03 LAB — COMPREHENSIVE METABOLIC PANEL
ALK PHOS: 86 U/L (ref 38–126)
ALT: 9 U/L — AB (ref 14–54)
AST: 20 U/L (ref 15–41)
Albumin: 4 g/dL (ref 3.5–5.0)
Anion gap: 11 (ref 5–15)
BILIRUBIN TOTAL: 0.6 mg/dL (ref 0.3–1.2)
BUN: 16 mg/dL (ref 6–20)
CALCIUM: 8.9 mg/dL (ref 8.9–10.3)
CO2: 24 mmol/L (ref 22–32)
CREATININE: 0.92 mg/dL (ref 0.44–1.00)
Chloride: 101 mmol/L (ref 101–111)
GFR calc Af Amer: >60 mL/min (ref >60)
Glucose, Bld: 117 mg/dL — ABNORMAL HIGH (ref 65–99)
Potassium: 3.3 mmol/L — ABNORMAL LOW (ref 3.5–5.1)
Sodium: 136 mmol/L (ref 135–145)
TOTAL PROTEIN: 7 g/dL (ref 6.5–8.1)

## 2017-02-03 LAB — I-STAT TROPONIN, ED
Troponin i, poc: 0 ng/mL (ref 0.00–0.08)
Troponin i, poc: 0.01 ng/mL (ref 0.00–0.08)

## 2017-02-03 NOTE — ED Notes (Signed)
ED Provider at bedside. 

## 2017-02-03 NOTE — Discharge Instructions (Signed)
As discussed, follow up with your primary care provider at your appointment on April 4th.  Use shoulder range of motion exercises to help with your shoulder.  Return to the emergency department if you experience chest pain, chest pressure, shortness of breath, nausea, vomiting, heart racing, or any other new concerning symptoms.

## 2017-02-03 NOTE — ED Provider Notes (Signed)
[facility] DEPT Provider Note   CSN: [id number] Arrival date & time: 02/03/17  1559     History   Chief Complaint Chief Complaint  Patient presents with  . Arm Pain  . Weakness    HPI Eileen Cameron is a 66 y.o. female presenting with intermittent left arm pain for the past 3 weeks. She also reports an episode of feeling like her left knee was going to give out while walking but she continued to walk afterwards. She denies any knee pain, swelling, erythema, warmth or any fall or injury. She is concerned that the left pain may be related to her heart and wanted to make sure it wasn't because of her family history of heart disease. He denies any personal history of heart disease. She is physically active and watches her diet. She denies chest pain, chest pressure, shortness of breath, fever, chills, nausea, vomiting, palpitations, weakness, dizziness.  HPI  Past Medical History:  Diagnosis Date  . Allergy    seasonal allergies, ace inhibitors--angioedema  . Hyperlipidemia   . Hypertension     Patient Active Problem List   Diagnosis Date Noted  . Acute non-recurrent frontal sinusitis 10/25/2016  . Post-nasal drip 10/10/2016  . Risk for coronary artery disease between 10% and 20% in next 10 years 08/11/2016  . History of statin therapy 08/10/2016  . Encounter for immunization 07/18/2016  . Right shoulder pain 04/20/2016  . Contact dermatitis 04/20/2016  . Leg cramps 11/18/2015  . Adrenal incidentaloma ([place]) 07/08/2015  . GERD (gastroesophageal reflux disease) 11/28/2014  . Insomnia 09/18/2014  . Seasonal allergies 12/31/2013  . Preventative health care 08/26/2012  . Essential hypertension 04/01/2009  . Lumbar back pain with radiculopathy affecting right lower extremity 04/01/2009    Past Surgical History:  Procedure Laterality Date  . COLONOSCOPY N/A 01/08/2013   Procedure: COLONOSCOPY;  Surgeon: Arta Silence, MD;  Location: [facility];  Service: Endoscopy;   Laterality: N/A;  . ECTOPIC PREGNANCY SURGERY  1970s  . right breast abscess drainage    . TONSILLECTOMY    . TUBAL LIGATION      OB History    No data available       Home Medications    Prior to Admission medications   Medication Sig Start Date End Date Taking? Authorizing Provider  Cholecalciferol (VITAMIN D PO) Take 1 tablet by mouth daily.   Yes Historical Provider, MD  Coenzyme Q10 (COQ10 PO) Take 1 capsule by mouth daily.   Yes Historical Provider, MD  diclofenac sodium (VOLTAREN) 1 % GEL Apply 2 g topically 4 (four) times daily. Patient taking differently: Apply 2 g topically 4 (four) times daily as needed (knee or arm pain).  04/20/16  Yes Liberty Handy, MD  esomeprazole (NEXIUM) 40 MG capsule Take 1 capsule (40 mg total) by mouth daily. Patient taking differently: Take 40 mg by mouth daily as needed (acid reflux).  07/18/16 07/18/17 Yes Jule Ser, DO  fluticasone (FLONASE) 50 MCG/ACT nasal spray Place 1 spray into both nostrils daily. Patient taking differently: Place 1 spray into both nostrils daily as needed for allergies or rhinitis.  10/25/16  Yes Milagros Loll, MD  Krill Oil 500 MG CAPS Take 500 mg by mouth daily.   Yes Historical Provider, MD  olmesartan-hydrochlorothiazide (BENICAR HCT) 40-25 MG tablet take 1 tablet by mouth once daily 10/03/16  Yes Lucious Groves, DO  OVER THE COUNTER MEDICATION Take 1 tablet by mouth at bedtime. Over the counter sleep aid   Yes  Historical Provider, MD  triamcinolone cream (KENALOG) 0.1 % Apply 1 application topically 2 (two) times daily as needed (eczema). 04/20/16  Yes Liberty Handy, MD  naproxen sodium (ALEVE) 220 MG tablet Take 1 tablet (220 mg total) by mouth 2 (two) times daily with a meal. Patient not taking: Reported on 02/03/2017 08/10/16   Lucious Groves, DO  pravastatin (PRAVACHOL) 20 MG tablet Take 1 tablet (20 mg total) by mouth every evening. Patient not taking: Reported on 02/03/2017 08/11/16 08/11/17  Lucious Groves, DO     Family History Family History  Problem Relation Age of Onset  . Heart disease Mother 74  . Diabetes Mother   . Hypertension Sister   . Heart disease Maternal Grandmother     Social History Social History  Substance Use Topics  . Smoking status: Never Smoker  . Smokeless tobacco: Not on file  . Alcohol use No     Allergies   Ace inhibitors   Review of Systems Review of Systems  Constitutional: Negative for chills and fever.  HENT: Negative for ear pain and sore throat.   Eyes: Negative for pain and visual disturbance.  Respiratory: Negative for cough, choking, chest tightness, shortness of breath, wheezing and stridor.   Cardiovascular: Negative for chest pain, palpitations and leg swelling.  Gastrointestinal: Negative for abdominal distention, abdominal pain, nausea and vomiting.  Genitourinary: Negative for dysuria and hematuria.  Musculoskeletal: Positive for myalgias. Negative for arthralgias, back pain, joint swelling and neck stiffness.       Left arm pain proximal humerus and deltoid as well as along the trapezius muscle on the left.  Skin: Negative for color change, pallor and rash.  Neurological: Negative for dizziness, tremors, seizures, syncope, facial asymmetry, speech difficulty, weakness, light-headedness, numbness and headaches.     Physical Exam Updated Vital Signs BP (!) 149/82   Pulse 73   Temp 98 F (36.7 C) (Oral)   Resp (!) 22   SpO2 98%   Physical Exam  Constitutional: She appears well-developed and well-nourished. No distress.  Patient is afebrile, nontoxic-appearing, sitting comfortably in bed in no acute distress.  HENT:  Head: Normocephalic and atraumatic.  Eyes: Conjunctivae and EOM are normal.  Neck: Neck supple.  Cardiovascular: Normal rate, regular rhythm, normal heart sounds and intact distal pulses.   No murmur heard. Pulmonary/Chest: Effort normal and breath sounds normal. No respiratory distress. She has no wheezes. She  has no rales. She exhibits no tenderness.  Abdominal: Soft. She exhibits no distension. There is no tenderness. There is no guarding.  Musculoskeletal: Normal range of motion. She exhibits tenderness. She exhibits no edema or deformity.  Tenderness to deep palpation of the proximal left tricep. Discomfort reproducible with "empty can test". Full rom of the shoulder No TTP to left patella, no erythema, swelling or warmth of left knee, joint is stable, negative anterior drawer.  - Motor: No involuntary movements. Muscle tone and bulk normal throughout. Muscle strength is 5/5 in bilateral shoulder abduction, elbow flexion and extension, wrist flexion and extension, thumb opposition, grip, hip extension, flexion, leg flexion and extension, ankle dorsiflexion and plantar flexion.   Neurological: She is alert. No cranial nerve deficit or sensory deficit. She exhibits normal muscle tone. Coordination normal.  Skin: Skin is warm and dry. No rash noted. She is not diaphoretic. No erythema. No pallor.  Psychiatric: She has a normal mood and affect.  Nursing note and vitals reviewed.    ED Treatments / Results  Labs (all labs  ordered are listed, but only abnormal results are displayed) Labs Reviewed  COMPREHENSIVE METABOLIC PANEL - Abnormal; Notable for the following:       Result Value   Potassium 3.3 (*)    Glucose, Bld 117 (*)    ALT 9 (*)    All other components within normal limits  CBC WITH DIFFERENTIAL/PLATELET  Randolm Idol, ED  Randolm Idol, ED    EKG  EKG Interpretation  Date/Time:  Saturday February 03 2017 16:15:25 EDT Ventricular Rate:  81 PR Interval:    QRS Duration: 84 QT Interval:  373 QTC Calculation: 433 R Axis:   -7 Text Interpretation:  Sinus rhythm Ventricular premature complex Consider left atrial enlargement Inferior infarct, old No significant change since last tracing Confirmed by ISAACS MD, CAMERON [phone]) on 02/03/2017 5:06:29 PM       Radiology Dg  Chest 2 View  Result Date: 02/03/2017 CLINICAL DATA:  Reason for exam: radiating left arm pain. Pt c/o of CP radiating to left arm that has been going on/off for the past 3 weeks; denies fever; denies cough. Medical hx: HTN (takes medication). EXAM: CHEST  2 VIEW COMPARISON:  10/09/2012 FINDINGS: The heart size and mediastinal contours are within normal limits. Both lungs are clear. No pleural effusion or pneumothorax. The visualized skeletal structures are unremarkable. IMPRESSION: No active cardiopulmonary disease. Electronically Signed   By: Lajean Manes M.D.   On: 02/03/2017 17:42    Procedures Procedures (including critical care time)  Medications Ordered in ED Medications - No data to display   Initial Impression / Assessment and Plan / ED Course  I have reviewed the triage vital signs and the nursing notes.  Pertinent labs & imaging results that were available during my care of the patient were reviewed by me and considered in my medical decision making (see chart for details).     Presents with left intermittent left arm pain over the last 3 weeks. Patient had concerns that it could be related to her heart due to family hx.  Reassuring exam, initial troponin negative. Labs unremarkable. Positive empty can test which may be indicative of rotator cuff pathology.  Checked on delta troponin at at 20:45 which was showing as "collected" at 19:45. Nurse found that it had been labeled as collected but it was never drawn.Waiting on delta troponin for discharge.  Delta troponin negative Workup unremarkable She is well-appearing and has been stable during her stay in ED. Heart score:3  Discharge home with shoulder exercises, rice protocol and close follow up with PCP on 02/07/17.  Patient was discussed with Dr. Ellender Hose who has seen patient and agrees with assessment and plan.  Discussed strict return precautions and advised to return to the emergency department if experiencing any new or  worsening symptoms. Instructions were understood and patient agreed with discharge plan.  Final Clinical Impressions(s) / ED Diagnoses   Final diagnoses:  Left arm pain    New Prescriptions Discharge Medication List as of 02/03/2017 10:08 PM       Emeline General, PA-C 02/04/17 [place], MD 02/06/17 [phone]

## 2017-02-03 NOTE — ED Notes (Signed)
Patient transported to X-ray 

## 2017-02-03 NOTE — ED Notes (Signed)
Will draw labs when pt returns from XR

## 2017-02-03 NOTE — ED Triage Notes (Signed)
Pt reports intermittent left arm/shoulder pain for weeks. Today  Felt like left leg was weak and "going to give out on her."

## 2017-02-08 ENCOUNTER — Ambulatory Visit (INDEPENDENT_AMBULATORY_CARE_PROVIDER_SITE_OTHER): Payer: Medicare Other | Admitting: Internal Medicine

## 2017-02-08 VITALS — BP 134/79 | HR 75 | Temp 98.0°F | Ht 63.0 in | Wt 186.7 lb

## 2017-02-08 DIAGNOSIS — M25512 Pain in left shoulder: Secondary | ICD-10-CM

## 2017-02-08 DIAGNOSIS — M791 Myalgia, unspecified site: Secondary | ICD-10-CM | POA: Insufficient documentation

## 2017-02-08 DIAGNOSIS — E559 Vitamin D deficiency, unspecified: Secondary | ICD-10-CM | POA: Diagnosis not present

## 2017-02-08 DIAGNOSIS — I10 Essential (primary) hypertension: Secondary | ICD-10-CM | POA: Diagnosis not present

## 2017-02-08 DIAGNOSIS — Z79899 Other long term (current) drug therapy: Secondary | ICD-10-CM

## 2017-02-08 DIAGNOSIS — D3502 Benign neoplasm of left adrenal gland: Secondary | ICD-10-CM

## 2017-02-08 DIAGNOSIS — E278 Other specified disorders of adrenal gland: Secondary | ICD-10-CM | POA: Diagnosis not present

## 2017-02-08 DIAGNOSIS — T466X5A Adverse effect of antihyperlipidemic and antiarteriosclerotic drugs, initial encounter: Secondary | ICD-10-CM | POA: Diagnosis not present

## 2017-02-08 MED ORDER — OLMESARTAN MEDOXOMIL-HCTZ 40-25 MG PO TABS: 1.0000 | ORAL_TABLET | Freq: Every day | ORAL | 3 refills | Status: DC

## 2017-02-08 NOTE — Assessment & Plan Note (Signed)
History of present illness: She has been taking olmesartan hydrochlorothiazide 40-25.  No complaints.  Assessment: Essential hypertension well controlled   Plan: Continue Benicar.

## 2017-02-08 NOTE — Assessment & Plan Note (Signed)
HPI: She reports left shoulder pain which started about 3 weeks ago. Associated also with some pains in her neck as well as in her legs. She thought that the leg pain was due to the cholesterol pill and so she stopped the pravastatin however she continued to have some soreness in her left shoulder. Due to extensive history of coronary artery disease when her family she was concerned about heart attack so she went to emergency department on March 31. Where she had a normal EKG and negative cardiac enzymes. She was noted to have a positive empty can test and was discharged with instructions for rice therapy. She has done this at home she feels like it is getting better.  A: left shoulder pain  P: No obvious rotator cuff pathaology on my exam, I could not elicit any bicipes tendonitits.  I am a little unclear as to what the cause of her shoulder pain was but it appears mild and improving.  She will let me know and will return sooner if it does not continue to improve.

## 2017-02-08 NOTE — Progress Notes (Signed)
[facility] Subjective:  HPI: [name] is a 66 y.o. female who presents for follow up of HTN and recent ED visit for left shoulder pain.  Please see problem based charting below for the status of her chronic medical problems.     Review of Systems: Per HPI Objective:  Physical Exam: Vitals:   02/08/17 0856  BP: 134/79  Pulse: 75  Temp: 98 F (36.7 C)  TempSrc: Oral  SpO2: 99%  Weight: 186 lb 11.2 oz (84.7 kg)  Height: 5\' 3"  (1.6 m)  Physical Exam  Constitutional: She appears well-developed and well-nourished.  Cardiovascular: Normal rate and regular rhythm.   Pulmonary/Chest: Effort normal and breath sounds normal. She has no wheezes.  Abdominal: Soft. She exhibits no mass.  Musculoskeletal: She exhibits no edema.       Left shoulder: She exhibits normal range of motion, no tenderness, no swelling and no effusion.  Normal AROM of shoulder bilaterally, negative Empty can test, neers test, yergensons on the left.  Mild tenderness of deltoid and biceps muscle to palpation on left.    Assessment & Plan:  Essential hypertension History of present illness: She has been taking olmesartan hydrochlorothiazide 40-25.  No complaints.  Assessment: Essential hypertension well controlled   Plan: Continue Benicar.  Adrenal incidentaloma In reviewing her chart, I see that she was found to have a 1 x 1.4 cm adrenal mass on her CT of the abdomen in September 2016. With [place] unit density of 23. Do not see that she has ever had follow-up of this mass. Therefore we will obtain a CT of the abdomen without contrast to reevaluate.  Myalgia due to statin History of present illness. She reports that she was having leg cramps as well as some muscle cramps in other areas after starting pravastatin. He says gotten better since she stopped pravastatin about 3 or 4 weeks ago. Cramping was never very severe.  Assessment myalgia due to statin, I also suspect  she has vitamin D deficiency.  Plan Check vitamin D  Left shoulder pain HPI: She reports left shoulder pain which started about 3 weeks ago. Associated also with some pains in her neck as well as in her legs. She thought that the leg pain was due to the cholesterol pill and so she stopped the pravastatin however she continued to have some soreness in her left shoulder. Due to extensive history of coronary artery disease when her family she was concerned about heart attack so she went to emergency department on March 31. Where she had a normal EKG and negative cardiac enzymes. She was noted to have a positive empty can test and was discharged with instructions for rice therapy. She has done this at home she feels like it is getting better.  A: left shoulder pain  P: No obvious rotator cuff pathaology on my exam, I could not elicit any bicipes tendonitits.  I am a little unclear as to what the cause of her shoulder pain was but it appears mild and improving.  She will let me know and will return sooner if it does not continue to improve.   Medications Ordered Meds ordered this encounter  Medications  . olmesartan-hydrochlorothiazide (BENICAR HCT) 40-25 MG tablet    Sig: Take 1 tablet by mouth daily.    Dispense:  90 tablet    Refill:  3   Other Orders Orders Placed This Encounter  Procedures  . CT ABDOMEN WO CONTRAST    Standing Status:  Future    Standing Expiration Date:   05/10/2018    Order Specific Question:   Reason for Exam (SYMPTOM  OR DIAGNOSIS REQUIRED)    Answer:   f/u small left adrenal incidentaloma from Sept 2016    Order Specific Question:   Preferred imaging location?    Answer:   [facility]    Order Specific Question:   Radiology Contrast Protocol - do NOT remove file path    Answer:   [email or web address]  . Vitamin D (25 hydroxy)   Follow Up: Return in about 6 months (around 08/10/2017), or if symptoms worsen or fail to improve.

## 2017-02-08 NOTE — Assessment & Plan Note (Signed)
In reviewing her chart, I see that she was found to have a 1 x 1.4 cm adrenal mass on her CT of the abdomen in September 2016. With [place] unit density of 23. Do not see that she has ever had follow-up of this mass. Therefore we will obtain a CT of the abdomen without contrast to reevaluate.

## 2017-02-08 NOTE — Patient Instructions (Signed)
I will call with the results of your labwork.  We will get a scan of your abdomen to follow up the spot on your adrenal gland.

## 2017-02-08 NOTE — Assessment & Plan Note (Signed)
History of present illness. She reports that she was having leg cramps as well as some muscle cramps in other areas after starting pravastatin. He says gotten better since she stopped pravastatin about 3 or 4 weeks ago. Cramping was never very severe.  Assessment myalgia due to statin, I also suspect she has vitamin D deficiency.  Plan Check vitamin D

## 2017-02-09 LAB — VITAMIN D 25 HYDROXY (VIT D DEFICIENCY, FRACTURES): VIT D 25 HYDROXY: 20.8 ng/mL — AB (ref 30.0–100.0)

## 2017-02-23 ENCOUNTER — Ambulatory Visit (HOSPITAL_COMMUNITY)
Admission: RE | Admit: 2017-02-23 | Discharge: 2017-02-23 | Disposition: A | Discharge status: Home / Self Care | Payer: Medicare Other | Source: Ambulatory Visit | Attending: Internal Medicine | Admitting: Internal Medicine

## 2017-02-23 DIAGNOSIS — D3502 Benign neoplasm of left adrenal gland: Secondary | ICD-10-CM | POA: Diagnosis not present

## 2017-02-23 MED ORDER — IOPAMIDOL (ISOVUE-300) INJECTION 61%
INTRAVENOUS | Status: AC
  Filled 2017-02-23: qty 30

## 2017-02-23 MED ORDER — IOPAMIDOL (ISOVUE-300) INJECTION 61%
30.0000 mL | Freq: Once | INTRAVENOUS | Status: AC | PRN
  Administered 2017-02-23: 30 mL via ORAL

## 2017-04-05 ENCOUNTER — Ambulatory Visit (INDEPENDENT_AMBULATORY_CARE_PROVIDER_SITE_OTHER): Payer: Medicare Other | Admitting: Internal Medicine

## 2017-04-05 VITALS — BP 149/67 | HR 77 | Temp 98.3°F | Ht 63.0 in | Wt 186.5 lb

## 2017-04-05 DIAGNOSIS — K219 Gastro-esophageal reflux disease without esophagitis: Secondary | ICD-10-CM

## 2017-04-05 DIAGNOSIS — M549 Dorsalgia, unspecified: Secondary | ICD-10-CM | POA: Insufficient documentation

## 2017-04-05 DIAGNOSIS — J302 Other seasonal allergic rhinitis: Secondary | ICD-10-CM | POA: Diagnosis not present

## 2017-04-05 DIAGNOSIS — L299 Pruritus, unspecified: Secondary | ICD-10-CM

## 2017-04-05 DIAGNOSIS — M546 Pain in thoracic spine: Secondary | ICD-10-CM | POA: Diagnosis not present

## 2017-04-05 MED ORDER — NAPROXEN 500 MG PO TABS: 500.0000 mg | ORAL_TABLET | Freq: Two times a day (BID) | ORAL | 0 refills | Status: DC

## 2017-04-05 MED ORDER — LORATADINE 10 MG PO TABS: 10.0000 mg | ORAL_TABLET | Freq: Every day | ORAL | 2 refills | Status: DC

## 2017-04-05 MED ORDER — CYCLOBENZAPRINE HCL 10 MG PO TABS: 5.0000 mg | ORAL_TABLET | Freq: Three times a day (TID) | ORAL | 0 refills | Status: DC | PRN

## 2017-04-05 MED ORDER — ESOMEPRAZOLE MAGNESIUM 40 MG PO CPDR: 40.0000 mg | DELAYED_RELEASE_CAPSULE | Freq: Every day | ORAL | 1 refills | Status: DC | PRN

## 2017-04-05 NOTE — Patient Instructions (Addendum)
Eileen Cameron,   It was a pleasure seeing you today. For your back pain, please take naproxen twice a day with food for the next few days. I have also given you a short course of a muscle relaxer. You may take this three times a day as needed. Do not drive while taking this medicine as it will make you sleepy. For your itching, please use hypoallergenic soaps and lotions without fragrance. I have also started you on daily claritin for your itching. You may continue to use benadryl as needed and your daily flonase. If you have any questions or concerns, call our clinic at [phone] or after hours call [phone] and ask for the internal medicine resident on call. Thank you!  - Dr. Philipp Ovens

## 2017-04-05 NOTE — Progress Notes (Signed)
   CC: Gas pain   HPI:  [name] is a 66 y.o. female with past medical history outlined below here for gas pain. For the details of today's visit, please refer to the assessment and plan.  Past Medical History:  Diagnosis Date  . Allergy    seasonal allergies, ace inhibitors--angioedema  . Hyperlipidemia   . Hypertension     Review of Systems:  Denies abdominal pain, N/V, diarrhea, and constipation. Denies chest pain.  Physical Exam:  Vitals:   04/05/17 1548  BP: (!) 149/67  Pulse: 77  Temp: 98.3 F (36.8 C)  TempSrc: Oral  SpO2: 98%  Weight: 186 lb 8 oz (84.6 kg)  Height: 5\' 3"  (1.6 m)    Constitutional: NAD, appears comfortable  Cardiovascular: RRR, no murmurs, rubs, or gallops.  Pulmonary/Chest: CTAB, no wheezes, rales, or rhonchi.  Abdominal: Soft, non tender, non distended. +BS.  Extremities: Warm and well perfused. No edema.  MSK: Muscular tenderness to palpation of her right upper back  Skin: No rashes or erythema   Assessment & Plan:   See Encounters Tab for problem based charting.  Patient discussed with Dr. Angelia Mould

## 2017-04-06 NOTE — Assessment & Plan Note (Signed)
Patient currently denies any reflux symptoms. She take Nexium on a PRN basis. Requesting a refill.  -- Refilled Nexium PRN

## 2017-04-06 NOTE — Assessment & Plan Note (Signed)
Patient is complaining of generalized itching for the past couple of weeks. She denies rash or any skin abnormality. She reports having sensitive skin and has recently been experimenting with new shampoos and soaps. Advised patient to use hypoallergenic soaps and lotions like unscented dove and Cetaphil lotion. Patient reports using Neutrogena for many years without issue. Plans to switch back to Neutrogena soap. She has been taking benadryl PRN with some relief, but reports side effect of sedation and can only tolerate it at night before bed. Advised patient to take a daily non sedating antihistamine like Claritin.  -- Claritin 10 mg daily -- Continue benadryl PRN  -- Hypoallergenic soaps & lotions when possible

## 2017-04-06 NOTE — Assessment & Plan Note (Signed)
Patient reports a history of seasonal allergies for which she uses intranasal Flonase. Her symptoms are only partially controlled. Given that she is also having generalized skin itching, likely from a contact irritant (see itching A&P), patient has agreed to start Claritin 10 mg daily.  -- Continue Flonase -- Claritin 10 mg daily

## 2017-04-06 NOTE — Progress Notes (Signed)
Internal Medicine Clinic Attending  Case discussed with Dr. Guilloud at the time of the visit.  We reviewed the resident's history and exam and pertinent patient test results.  I agree with the assessment, diagnosis, and plan of care documented in the resident's note.  

## 2017-04-06 NOTE — Assessment & Plan Note (Signed)
Patient is here today with complaint of pain in her right upper back that she is concerned is gas pain. She has been taking simethicone and maalox without relief. She denies nausea, vomiting, and symptoms of reflux. Her bowel movements have been normal and regular. She denies diarrhea and constipation. She denies abdominal pain and physical exam is reassuring. I advised her that I did not think her symptoms were consistent with gas pain. She has muscular tenderness to palpation of her right upper back. She denies any heavy lifting or injury to the area. Pain is likely MSK in etiology. Instructed her to take ibuprofen 800 mg q8 for the next few days. I have also provided her with a short 3 day course of flexeril. Advised patient that pain should resolve over the next couple of weeks with conservative therapy.  -- Ibuprofen 800 mg q8 x 1 week  -- Flexeril 5-10 mg TID prn (#10 tabs)  -- Follow up as needed

## 2017-08-07 DIAGNOSIS — E559 Vitamin D deficiency, unspecified: Secondary | ICD-10-CM | POA: Insufficient documentation

## 2017-08-07 NOTE — Progress Notes (Signed)
[facility] Subjective:  HPI: [name] is a 66 y.o. female who presents for follow up of HTN.  Please see Assessment and Plan below for the status of her chronic medical problems.  Review of Systems: Review of Systems  Constitutional: Negative for fever.  Eyes: Negative for blurred vision.  Respiratory: Negative for cough and shortness of breath.   Cardiovascular: Negative for chest pain.  Genitourinary: Negative for dysuria.  Musculoskeletal: Negative for falls, joint pain and myalgias.  Neurological: Negative for dizziness and headaches.  Psychiatric/Behavioral: Negative for depression.    Objective:  Physical Exam: Vitals:   08/09/17 0927  BP: 140/69  Pulse: 68  Temp: 98.1 F (36.7 C)  TempSrc: Oral  SpO2: 100%  Weight: 188 lb 3.2 oz (85.4 kg)  Height: 5\' 4"  (1.626 m)  Physical Exam  Constitutional: She is well-developed, well-nourished, and in no distress. No distress.  HENT:  Head: Normocephalic and atraumatic.  Eyes: Conjunctivae are normal.  Cardiovascular: Normal rate, regular rhythm, normal heart sounds and intact distal pulses.   No murmur heard. Pulmonary/Chest: Effort normal and breath sounds normal. No respiratory distress. She has no wheezes. She has no rales.  Abdominal: Soft. Bowel sounds are normal. She exhibits no distension. There is no tenderness.  Musculoskeletal: She exhibits no edema.  Skin: Skin is warm and dry. She is not diaphoretic.  Psychiatric: Affect and judgment normal.  Nursing note and vitals reviewed.  Assessment & Plan:  Essential hypertension HPI: Doing well with BP medications, no complaints  A: Essential HTN  P: Continue olmesartan-HCTZ 40-25 daily  GERD (gastroesophageal reflux disease) HPI: C/o metallic taste in mouth occasionally, heartburn otherwise controlled. Taking Nexium PRN.  A: GERD  P: Continue Nexium 40mg  PRN  Seasonal allergies HPI: C/o occasionally frontal HA as well as  post nasal drip.  Using flonase PRN.  Does not take claritin.  A: Seasonal allergies  P: Start Zyrtec 10mg  daily  Adrenal incidentaloma ([place]) We repeated a CT of her abd at last visit, this revealed left adrenal mass was stable, no further imaging is needed.  Vitamin D insufficiency HPI: Reports she frequently forgets to take her vitamin d supplement, thinks she has been taking 500 IU of D3 daily but is unsure.  A: Vitamin D insufficiency  P: Recheck Vitamin D, instructed to start taking 1000 IU of vitamin D3 daily  Fatigue due to sleep pattern disturbance HPI: Reports daytime fatigue, falls asleep watching TV, grandson has told her she stops breathing in her sleep, she does admit to snoring.   Reports that she does wake during her sleep. A sleep study was ordered back in 2015 but never completed, she is motivated to get it done now.  A: Fatigue, snoring  P: Referral for sleep study.   Medications Ordered Meds ordered this encounter  Medications  . cetirizine (ZYRTEC ALLERGY) 10 MG tablet    Sig: Take 1 tablet (10 mg total) by mouth daily.    Dispense:  90 tablet    Refill:  3  . esomeprazole (NEXIUM) 40 MG capsule    Sig: Take 1 capsule (40 mg total) by mouth daily as needed.    Dispense:  90 capsule    Refill:  1   Other Orders Orders Placed This Encounter  Procedures  . Flu Vaccine QUAD 36+ mos IM  . Vitamin D (25 hydroxy)  . BMP8+Anion Gap  . Ambulatory referral to Sleep Studies    Referral Priority:   Routine  Referral Type:   Consultation    Referral Reason:   Specialty Services Required    Number of Visits Requested:   1   Follow Up: Return in about 3 months (around 11/09/2017).

## 2017-08-09 ENCOUNTER — Ambulatory Visit (INDEPENDENT_AMBULATORY_CARE_PROVIDER_SITE_OTHER): Payer: Medicare Other | Admitting: Internal Medicine

## 2017-08-09 VITALS — BP 140/69 | HR 68 | Temp 98.1°F | Ht 64.0 in | Wt 188.2 lb

## 2017-08-09 DIAGNOSIS — G479 Sleep disorder, unspecified: Secondary | ICD-10-CM

## 2017-08-09 DIAGNOSIS — E559 Vitamin D deficiency, unspecified: Secondary | ICD-10-CM | POA: Diagnosis not present

## 2017-08-09 DIAGNOSIS — J302 Other seasonal allergic rhinitis: Secondary | ICD-10-CM | POA: Diagnosis not present

## 2017-08-09 DIAGNOSIS — K219 Gastro-esophageal reflux disease without esophagitis: Secondary | ICD-10-CM | POA: Diagnosis not present

## 2017-08-09 DIAGNOSIS — R0683 Snoring: Secondary | ICD-10-CM

## 2017-08-09 DIAGNOSIS — E669 Obesity, unspecified: Secondary | ICD-10-CM

## 2017-08-09 DIAGNOSIS — Z23 Encounter for immunization: Secondary | ICD-10-CM

## 2017-08-09 DIAGNOSIS — E278 Other specified disorders of adrenal gland: Secondary | ICD-10-CM

## 2017-08-09 DIAGNOSIS — I10 Essential (primary) hypertension: Secondary | ICD-10-CM

## 2017-08-09 DIAGNOSIS — R5383 Other fatigue: Secondary | ICD-10-CM

## 2017-08-09 DIAGNOSIS — E66811 Obesity, class 1: Secondary | ICD-10-CM

## 2017-08-09 MED ORDER — CETIRIZINE HCL 10 MG PO TABS: 10.0000 mg | ORAL_TABLET | Freq: Every day | ORAL | 3 refills | Status: DC

## 2017-08-09 MED ORDER — ESOMEPRAZOLE MAGNESIUM 40 MG PO CPDR: 40.0000 mg | DELAYED_RELEASE_CAPSULE | Freq: Every day | ORAL | 1 refills | Status: DC | PRN

## 2017-08-09 NOTE — Patient Instructions (Signed)
I am going to send you for a sleep study.  I want you to start taking Zyrtec for your allergies, I hope this will help with the headaches.

## 2017-08-09 NOTE — Assessment & Plan Note (Signed)
HPI: Reports she frequently forgets to take her vitamin d supplement, thinks she has been taking 500 IU of D3 daily but is unsure.  A: Vitamin D insufficiency  P: Recheck Vitamin D, instructed to start taking 1000 IU of vitamin D3 daily

## 2017-08-09 NOTE — Assessment & Plan Note (Signed)
We repeated a CT of her abd at last visit, this revealed left adrenal mass was stable, no further imaging is needed.

## 2017-08-09 NOTE — Assessment & Plan Note (Signed)
HPI: C/o metallic taste in mouth occasionally, heartburn otherwise controlled. Taking Nexium PRN.  A: GERD  P: Continue Nexium 40mg  PRN

## 2017-08-09 NOTE — Assessment & Plan Note (Signed)
HPI: C/o occasionally frontal HA as well as post nasal drip.  Using flonase PRN.  Does not take claritin.  A: Seasonal allergies  P: Start Zyrtec 10mg  daily

## 2017-08-09 NOTE — Assessment & Plan Note (Signed)
HPI: Reports daytime fatigue, falls asleep watching TV, grandson has told her she stops breathing in her sleep, she does admit to snoring.   Reports that she does wake during her sleep. A sleep study was ordered back in 2015 but never completed, she is motivated to get it done now.  A: Fatigue, snoring  P: Referral for sleep study.

## 2017-08-09 NOTE — Assessment & Plan Note (Signed)
HPI: Doing well with BP medications, no complaints  A: Essential HTN  P: Continue olmesartan-HCTZ 40-25 daily

## 2017-08-10 LAB — BMP8+ANION GAP
Anion Gap: 15 mmol/L (ref 10.0–18.0)
BUN / CREAT RATIO: 15 (ref 12–28)
BUN: 14 mg/dL (ref 8–27)
CO2: 24 mmol/L (ref 20–29)
CREATININE: 0.95 mg/dL (ref 0.57–1.00)
Calcium: 9.7 mg/dL (ref 8.7–10.3)
Chloride: 99 mmol/L (ref 96–106)
GFR calc Af Amer: 71 mL/min/{1.73_m2} (ref >59)
GFR calc non Af Amer: 62 mL/min/{1.73_m2} (ref >59)
Glucose: 93 mg/dL (ref 65–99)
Potassium: 4.1 mmol/L (ref 3.5–5.2)
Sodium: 138 mmol/L (ref 134–144)

## 2017-08-10 LAB — VITAMIN D 25 HYDROXY (VIT D DEFICIENCY, FRACTURES): Vit D, 25-Hydroxy: 24.4 ng/mL — ABNORMAL LOW (ref 30.0–100.0)

## 2017-09-04 ENCOUNTER — Other Ambulatory Visit: Payer: Self-pay | Admitting: Internal Medicine

## 2017-09-04 DIAGNOSIS — J0110 Acute frontal sinusitis, unspecified: Secondary | ICD-10-CM

## 2017-09-04 MED ORDER — FLUTICASONE PROPIONATE 50 MCG/ACT NA SUSP: 1.0000 | Freq: Every day | NASAL | 2 refills | Status: DC | PRN

## 2017-09-04 NOTE — Telephone Encounter (Signed)
Refill Request   Flonase 50 MCG/ACT

## 2017-09-12 ENCOUNTER — Encounter: Payer: Self-pay | Admitting: Neurology

## 2017-09-12 ENCOUNTER — Ambulatory Visit (INDEPENDENT_AMBULATORY_CARE_PROVIDER_SITE_OTHER): Payer: Medicare Other | Admitting: Neurology

## 2017-09-12 VITALS — BP 141/82 | HR 71 | Resp 14 | Ht 63.0 in | Wt 187.5 lb

## 2017-09-12 DIAGNOSIS — E669 Obesity, unspecified: Secondary | ICD-10-CM

## 2017-09-12 DIAGNOSIS — G4719 Other hypersomnia: Secondary | ICD-10-CM

## 2017-09-12 DIAGNOSIS — R0683 Snoring: Secondary | ICD-10-CM | POA: Diagnosis not present

## 2017-09-12 DIAGNOSIS — G2581 Restless legs syndrome: Secondary | ICD-10-CM | POA: Diagnosis not present

## 2017-09-12 DIAGNOSIS — R0681 Apnea, not elsewhere classified: Secondary | ICD-10-CM | POA: Diagnosis not present

## 2017-09-12 NOTE — Patient Instructions (Addendum)

## 2017-09-12 NOTE — Progress Notes (Signed)
Subjective:    Patient ID: Eileen Cameron is a 66 y.o. female.  HPI     Star Age, MD, PhD [facility] [address], [address] [place], [place] [address]  Dear Dr. Heber Silverton,   I saw your patient, Eileen Cameron, upon your kind request in my neurologic clinic today for initial consultation of her sleep disorder, in particular, concern for underlying obstructive sleep apnea. The patient is unaccompanied today. As you know, Eileen Cameron is a 66 year old right-handed woman with an underlying medical history of hypertension, reflux disease, vitamin D deficiency, seasonal allergies, and obesity, who reports snoring and excessive daytime somnolence. I reviewed your office note from 08/09/2017. Her Epworth sleepiness score is 4/24 today. She is divorced, she lives alone, she has 1 child. She is retired. She does not smoke and does not utilize any alcohol and does not currently utilize caffeine on a regular basis. She had a tonsillectomy as a child. She has difficulty going to sleep and staying asleep, she still has hot flashes at night.  She lives alone, she has a 30 yo daughter, 3 GC.  She has woken up from post nasal drip. No GERD at night. She has no night to night nocturia. She has no AM HAs. Her 59 grandson has witnessed pauses in her breathing while she is asleep. She has a longer standing difficulty of sleep maintenance and sleep initiation, has been taking over-the-counter Unisom for years as I understand. She has intermittent restless leg symptoms particularly affecting her left leg. She tends to sleep on her sides. She is not aware of any family history of OSA. Her daughter has also mentioned breathing irregularities while she is asleep to her.  Her Past Medical History Is Significant For: Past Medical History:  Diagnosis Date  . Allergy    seasonal allergies, ace inhibitors--angioedema  . Hyperlipidemia   . Hypertension     Her Past Surgical History Is  Significant For: Past Surgical History:  Procedure Laterality Date  . ECTOPIC PREGNANCY SURGERY  1970s  . right breast abscess drainage    . TONSILLECTOMY    . TUBAL LIGATION      Her Family History Is Significant For: Family History  Problem Relation Age of Onset  . Heart disease Mother 22  . Diabetes Mother   . Hypertension Sister   . Heart disease Maternal Grandmother     Her Social History Is Significant For: Social History   Socioeconomic History  . Marital status: Divorced    Spouse name: None  . Number of children: 1  . Years of education: 108  . Highest education level: None  Social Needs  . Financial resource strain: None  . Food insecurity - worry: None  . Food insecurity - inability: None  . Transportation needs - medical: None  . Transportation needs - non-medical: None  Occupational History  . Occupation: Retired   Tobacco Use  . Smoking status: Never Smoker  Substance and Sexual Activity  . Alcohol use: No    Alcohol/week: 0.0 oz  . Drug use: No  . Sexual activity: None  Other Topics Concern  . None  Social History Narrative   Lives alone   Caffeine use: none   Right handed     Her Allergies Are:  Allergies  Allergen Reactions  . Ace Inhibitors Swelling  :   Her Current Medications Are:  Outpatient Encounter Medications as of 09/12/2017  Medication Sig  . cetirizine (ZYRTEC ALLERGY) 10 MG  tablet Take 1 tablet (10 mg total) by mouth daily.  Marland Kitchen doxylamine, Sleep, (UNISOM) 25 MG tablet Take 25 mg at bedtime as needed by mouth.  . esomeprazole (NEXIUM) 40 MG capsule Take 1 capsule (40 mg total) by mouth daily as needed.  . fluticasone (FLONASE) 50 MCG/ACT nasal spray Place 1 spray into both nostrils daily as needed for allergies or rhinitis.  Marland Kitchen olmesartan-hydrochlorothiazide (BENICAR HCT) 40-25 MG tablet Take 1 tablet by mouth daily.  . [DISCONTINUED] Cholecalciferol (VITAMIN D PO) Take 1 tablet by mouth daily.  . [DISCONTINUED] Coenzyme Q10  (COQ10 PO) Take 1 capsule by mouth daily.  . [DISCONTINUED] diclofenac sodium (VOLTAREN) 1 % GEL Apply 2 g topically 4 (four) times daily. (Patient taking differently: Apply 2 g topically 4 (four) times daily as needed (knee or arm pain). )  . [DISCONTINUED] Krill Oil 500 MG CAPS Take 500 mg by mouth daily.  . [DISCONTINUED] naproxen (NAPROSYN) 500 MG tablet Take 1 tablet (500 mg total) by mouth 2 (two) times daily with a meal.  . [DISCONTINUED] naproxen sodium (ALEVE) 220 MG tablet Take 1 tablet (220 mg total) by mouth 2 (two) times daily with a meal. (Patient not taking: Reported on 02/03/2017)  . [DISCONTINUED] OVER THE COUNTER MEDICATION Take 1 tablet by mouth at bedtime. Over the counter sleep aid  . [DISCONTINUED] pravastatin (PRAVACHOL) 20 MG tablet Take 1 tablet (20 mg total) by mouth every evening. (Patient not taking: Reported on 02/03/2017)  . [DISCONTINUED] triamcinolone cream (KENALOG) 0.1 % Apply 1 application topically 2 (two) times daily as needed (eczema).   No facility-administered encounter medications on file as of 09/12/2017.   :  Review of Systems:  Out of a complete 14 point review of systems, all are reviewed and negative with the exception of these symptoms as listed below: Review of Systems  Neurological: Positive for dizziness.       Patient c/o insomnia. Cannot sleep well at night. Wakes up several times. Also c/o snoring/restless legs.    Objective:  Neurological Exam  Physical Exam Physical Examination:   Vitals:   09/12/17 1101  BP: (!) 141/82  Pulse: 71  Resp: 14  SpO2: 98%   General Examination: The patient is a very pleasant 66 y.o. female in no acute distress. She appears well-developed and well-nourished and well groomed.   HEENT: Normocephalic, atraumatic, pupils are equal, round and reactive to light and accommodation. Funduscopic exam is normal with sharp disc margins noted. Extraocular tracking is good without limitation to gaze excursion or  nystagmus noted. Normal smooth pursuit is noted. Hearing is grossly intact. Tympanic membranes are clear bilaterally. Face is symmetric with normal facial animation and normal facial sensation. Speech is clear with no dysarthria noted. There is no hypophonia. There is no lip, neck/head, jaw or voice tremor. Neck is supple with full range of passive and active motion. There are no carotid bruits on auscultation. Oropharynx exam reveals: mild mouth dryness, adequate dental hygiene and mild airway crowding, due to smaller airway entry and redundant and lower reaching soft palate. Mallampati is class II. Tongue protrudes centrally and palate elevates symmetrically. Tonsils are absent. Neck size is 14 and 3/8 inches. she has full dentures on top and her own teeth on the bottom. Nasal inspection reveals no significant nasal mucosal bogginess or redness and no septal deviation.   Chest: Clear to auscultation without wheezing, rhonchi or crackles noted.  Heart: S1+S2+0, regular and normal without murmurs, rubs or gallops noted.   Abdomen:  Soft, non-tender and non-distended with normal bowel sounds appreciated on auscultation.  Extremities: There is no pitting edema in the distal lower extremities bilaterally. she does have puffy ankles.   Skin: Warm and dry without trophic changes noted.  Musculoskeletal: exam reveals no obvious joint deformities, tenderness or joint swelling or erythema.   Neurologically:  Mental status: The patient is awake, alert and oriented in all 4 spheres. Her immediate and remote memory, attention, language skills and fund of knowledge are appropriate. There is no evidence of aphasia, agnosia, apraxia or anomia. Speech is clear with normal prosody and enunciation. Thought process is linear. Mood is normal and affect is normal.  Cranial nerves II - XII are as described above under HEENT exam. In addition: shoulder shrug is normal with equal shoulder height noted. Motor exam: Normal  bulk, strength and tone is noted. There is no drift, tremor or rebound. Romberg is negative. Reflexes are 2+ throughout. Babinski: Toes are flexor bilaterally. Fine motor skills and coordination: intact with normal finger taps, normal hand movements, normal rapid alternating patting, normal foot taps and normal foot agility.  Cerebellar testing: No dysmetria or intention tremor on finger to nose testing. Heel to shin is unremarkable bilaterally. There is no truncal or gait ataxia.  Sensory exam: intact to light touch in the upper and lower extremities.  Gait, station and balance: She stands easily. No veering to one side is noted. No leaning to one side is noted. Posture is age-appropriate and stance is narrow based. Gait shows normal stride length and normal pace. No problems turning are noted. Tandem walk is unremarkable.   Assessment and Plan:  In summary, Eileen Cameron is a very pleasant 66 y.o.-year old female with an underlying medical history of hypertension, reflux disease, vitamin D deficiency, seasonal allergies, and obesity, whose history and physical exam are concerning for obstructive sleep apnea (OSA). In addition, she reports intermittent restless leg symptoms. I had a long chat with the patient about my findings and the diagnosis of OSA, its prognosis and treatment options. We talked about medical treatments, surgical interventions and non-pharmacological approaches. I explained in particular the risks and ramifications of untreated moderate to severe OSA, especially with respect to developing cardiovascular disease down the Road, including congestive heart failure, difficult to treat hypertension, cardiac arrhythmias, or stroke. Even type 2 diabetes has, in part, been linked to untreated OSA. Symptoms of untreated OSA include daytime sleepiness, memory problems, mood irritability and mood disorder such as depression and anxiety, lack of energy, as well as recurrent headaches, especially  morning headaches. We talked about trying to maintain a healthy lifestyle in general, as well as the importance of weight control. I encouraged the patient to eat healthy, exercise daily and keep well hydrated, to keep a scheduled bedtime and wake time routine, to not skip any meals and eat healthy snacks in between meals. I advised the patient not to drive when feeling sleepy. I recommended the following at this time: sleep study with potential positive airway pressure titration. (We will score hypopneas at 4%).   I explained the sleep test procedure to the patient and also outlined possible surgical and non-surgical treatment options of OSA, including the use of a custom-made dental device (which would require a referral to a specialist dentist or oral surgeon), upper airway surgical options, such as pillar implants, radiofrequency surgery, tongue base surgery, and UPPP (which would involve a referral to an ENT surgeon). Rarely, jaw surgery such as mandibular advancement may be considered.  I also explained the CPAP treatment option to the patient, who indicated that she would be willing to try CPAP if the need arises. I explained the importance of being compliant with PAP treatment, not only for insurance purposes but primarily to improve Her symptoms, and for the patient's long term health benefit, including to reduce Her cardiovascular risks. I answered all her questions today and the patient was in agreement. I would like to see her back after the sleep study is completed and encouraged her to call with any interim questions, concerns, problems or updates.   Thank you very much for allowing me to participate in the care of this nice patient. If I can be of any further assistance to you please do not hesitate to call me at [phone].  Sincerely,   Star Age, MD, PhD

## 2017-10-02 ENCOUNTER — Telehealth: Payer: Self-pay | Admitting: Neurology

## 2017-10-02 NOTE — Telephone Encounter (Signed)
We have attempted to call the patient 2 times to schedule sleep study. Patient has been unavailable at the phone numbers we have on file and has not returned our calls. At this point we will send a letter asking pt to please contact the sleep lab to schedule their sleep study. If patient calls back we will schedule them for their sleep study. ° °
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
## 2017-11-14 ENCOUNTER — Telehealth: Payer: Self-pay | Admitting: Neurology

## 2017-11-14 NOTE — Telephone Encounter (Signed)
Pt has been left voicemail's on four different occasions. Pt has yet to reach back out to me. At this time I will discontinue calling pt to get her scheduled for a sleep study. If she calls back I will be more than happy to scheduled her.

## 2017-11-15 ENCOUNTER — Ambulatory Visit (INDEPENDENT_AMBULATORY_CARE_PROVIDER_SITE_OTHER): Payer: Medicare Other | Admitting: Internal Medicine

## 2017-11-15 ENCOUNTER — Other Ambulatory Visit: Payer: Self-pay

## 2017-11-15 VITALS — BP 148/88 | HR 74 | Temp 97.8°F | Ht 65.0 in | Wt 185.1 lb

## 2017-11-15 DIAGNOSIS — Z79899 Other long term (current) drug therapy: Secondary | ICD-10-CM | POA: Diagnosis not present

## 2017-11-15 DIAGNOSIS — G4700 Insomnia, unspecified: Secondary | ICD-10-CM

## 2017-11-15 DIAGNOSIS — I10 Essential (primary) hypertension: Secondary | ICD-10-CM | POA: Diagnosis present

## 2017-11-15 DIAGNOSIS — R5383 Other fatigue: Secondary | ICD-10-CM

## 2017-11-15 DIAGNOSIS — G479 Sleep disorder, unspecified: Secondary | ICD-10-CM

## 2017-11-15 NOTE — Patient Instructions (Signed)
Please get your sleep study done!

## 2017-11-16 ENCOUNTER — Encounter: Payer: Self-pay | Admitting: Internal Medicine
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
## 2017-11-16 NOTE — Assessment & Plan Note (Signed)
HPI : saw sleep specialist, however has not gotten around to calling back about scheduling sleep study.  I reiterated Importance of this and she will call and schedule this week.  Reports her phone number is accurate. Continue to have some daytime fatigue  A: Fatigue  P: -Will schedule sleep study

## 2017-12-07 ENCOUNTER — Other Ambulatory Visit: Payer: Self-pay | Admitting: Internal Medicine

## 2017-12-07 DIAGNOSIS — Z1231 Encounter for screening mammogram for malignant neoplasm of breast: Secondary | ICD-10-CM

## 2017-12-22 ENCOUNTER — Other Ambulatory Visit: Payer: Self-pay

## 2017-12-22 ENCOUNTER — Emergency Department (HOSPITAL_COMMUNITY)
Admission: EM | Admit: 2017-12-22 | Discharge: 2017-12-22 | Disposition: A | Discharge status: Home / Self Care | Payer: Medicare Other | Attending: Emergency Medicine | Admitting: Emergency Medicine

## 2017-12-22 ENCOUNTER — Emergency Department (HOSPITAL_COMMUNITY): Payer: Medicare Other

## 2017-12-22 ENCOUNTER — Encounter (HOSPITAL_COMMUNITY): Payer: Self-pay

## 2017-12-22 DIAGNOSIS — M5416 Radiculopathy, lumbar region: Secondary | ICD-10-CM | POA: Diagnosis not present

## 2017-12-22 DIAGNOSIS — I10 Essential (primary) hypertension: Secondary | ICD-10-CM | POA: Diagnosis not present

## 2017-12-22 DIAGNOSIS — R42 Dizziness and giddiness: Secondary | ICD-10-CM

## 2017-12-22 DIAGNOSIS — Z79899 Other long term (current) drug therapy: Secondary | ICD-10-CM | POA: Diagnosis not present

## 2017-12-22 LAB — CBC
HCT: 40 % (ref 36.0–46.0)
HEMOGLOBIN: 13.5 g/dL (ref 12.0–15.0)
MCH: 30.5 pg (ref 26.0–34.0)
MCHC: 33.8 g/dL (ref 30.0–36.0)
MCV: 90.5 fL (ref 78.0–100.0)
Platelets: 245 10*3/uL (ref 150–400)
RBC: 4.42 MIL/uL (ref 3.87–5.11)
RDW: 13.2 % (ref 11.5–15.5)
WBC: 4.9 10*3/uL (ref 4.0–10.5)

## 2017-12-22 LAB — I-STAT TROPONIN, ED: TROPONIN I, POC: 0 ng/mL (ref 0.00–0.08)

## 2017-12-22 LAB — URINALYSIS, ROUTINE W REFLEX MICROSCOPIC
BILIRUBIN URINE: NEGATIVE
Glucose, UA: NEGATIVE mg/dL
HGB URINE DIPSTICK: NEGATIVE
Ketones, ur: NEGATIVE mg/dL
Leukocytes, UA: NEGATIVE
Nitrite: NEGATIVE
Protein, ur: NEGATIVE mg/dL
SPECIFIC GRAVITY, URINE: 1.006 (ref 1.005–1.030)
pH: 6 (ref 5.0–8.0)

## 2017-12-22 LAB — BASIC METABOLIC PANEL
ANION GAP: 11 (ref 5–15)
BUN: 17 mg/dL (ref 6–20)
CALCIUM: 9.3 mg/dL (ref 8.9–10.3)
CO2: 23 mmol/L (ref 22–32)
Chloride: 104 mmol/L (ref 101–111)
Creatinine, Ser: 1.09 mg/dL — ABNORMAL HIGH (ref 0.44–1.00)
GFR, EST AFRICAN AMERICAN: 59 mL/min — AB (ref >60)
GFR, EST NON AFRICAN AMERICAN: 51 mL/min — AB (ref >60)
GLUCOSE: 114 mg/dL — AB (ref 65–99)
Potassium: 4.1 mmol/L (ref 3.5–5.1)
Sodium: 138 mmol/L (ref 135–145)

## 2017-12-22 MED ORDER — MECLIZINE HCL 25 MG PO TABS: 25.0000 mg | ORAL_TABLET | Freq: Three times a day (TID) | ORAL | 0 refills | Status: DC | PRN

## 2017-12-22 NOTE — ED Triage Notes (Signed)
Patient complains of 3 weeks of intermittent dizziness that resolves when she sits. Also has intermittent bilateral leg pain with same. Alert and oriented, denies pain on arrival. No neuro deficits

## 2017-12-22 NOTE — ED Provider Notes (Signed)
[facility] EMERGENCY DEPARTMENT Provider Note   CSN: [id number] Arrival date & time: 12/22/17  1352     History   Chief Complaint No chief complaint on file.   HPI Eileen Cameron is a 67 y.o. female.  HPI Patient presents with multiple complaints.  Poor historian.  Complains of intermittent dizziness for uncertain period of time.  Dizziness is worse when trying to sit up.  Has some associated nausea.  Patient also complains of left thigh numbness.  States this is been present for 30 or 40 years.  Denies back pain.  No lower extremity swelling.  No weakness.  Denies difficulty urinating or incontinence. Past Medical History:  Diagnosis Date  . Adrenal incidentaloma ([place]) 07/08/2015   1.0 by 1.4 cm lateral limb left adrenal mass on CT abdomen on 07/08/15 Followed up in April 2018, stable no further imaging required.  . Allergy    seasonal allergies, ace inhibitors--angioedema  . Hyperlipidemia   . Hypertension     Patient Active Problem List   Diagnosis Date Noted  . Obesity (BMI 30.0-34.9) 08/09/2017  . Vitamin D insufficiency 08/07/2017  . Back pain 04/05/2017  . Myalgia due to statin 02/08/2017  . Left shoulder pain 02/08/2017  . Post-nasal drip 10/10/2016  . Risk for coronary artery disease between 10% and 20% in next 10 years 08/11/2016  . History of statin therapy 08/10/2016  . Encounter for immunization 07/18/2016  . Right shoulder pain 04/20/2016  . Contact dermatitis 04/20/2016  . Leg cramps 11/18/2015  . GERD (gastroesophageal reflux disease) 11/28/2014  . Itching 10/16/2014  . Insomnia 09/18/2014  . Seasonal allergies 12/31/2013  . Fatigue due to sleep pattern disturbance 05/13/2013  . Preventative health care 08/26/2012  . Essential hypertension 04/01/2009  . Lumbar back pain with radiculopathy affecting right lower extremity 04/01/2009    Past Surgical History:  Procedure Laterality Date  . COLONOSCOPY N/A 01/08/2013   Procedure:  COLONOSCOPY;  Surgeon: Arta Silence, MD;  Location: [facility];  Service: Endoscopy;  Laterality: N/A;  . ECTOPIC PREGNANCY SURGERY  1970s  . right breast abscess drainage    . TONSILLECTOMY    . TUBAL LIGATION      OB History    No data available       Home Medications    Prior to Admission medications   Medication Sig Start Date End Date Taking? Authorizing Provider  cetirizine (ZYRTEC ALLERGY) 10 MG tablet Take 1 tablet (10 mg total) by mouth daily. 08/09/17   Lucious Groves, DO  doxylamine, Sleep, (UNISOM) 25 MG tablet Take 25 mg at bedtime as needed by mouth.    [provider]  esomeprazole (NEXIUM) 40 MG capsule Take 1 capsule (40 mg total) by mouth daily as needed. 08/09/17 08/09/18  Lucious Groves, DO  fluticasone (FLONASE) 50 MCG/ACT nasal spray Place 1 spray into both nostrils daily as needed for allergies or rhinitis. 09/04/17   Lucious Groves, DO  meclizine (ANTIVERT) 25 MG tablet Take 1 tablet (25 mg total) by mouth 3 (three) times daily as needed for dizziness. 12/22/17   Julianne Rice, MD  olmesartan-hydrochlorothiazide (BENICAR HCT) 40-25 MG tablet Take 1 tablet by mouth daily. 02/08/17   Lucious Groves, DO    Family History Family History  Problem Relation Age of Onset  . Heart disease Mother 31  . Diabetes Mother   . Hypertension Sister   . Heart disease Maternal Grandmother     Social History Social History  Tobacco Use  . Smoking status: Never Smoker  Substance Use Topics  . Alcohol use: No    Alcohol/week: 0.0 oz  . Drug use: No     Allergies   Ace inhibitors   Review of Systems Review of Systems  Constitutional: Negative for chills, fatigue and fever.  HENT: Negative for congestion, facial swelling, sinus pressure, sore throat and trouble swallowing.   Eyes: Negative for photophobia and visual disturbance.  Respiratory: Negative for cough and shortness of breath.   Cardiovascular: Negative for chest pain, palpitations and  leg swelling.  Gastrointestinal: Positive for nausea. Negative for abdominal pain, constipation, diarrhea and vomiting.  Genitourinary: Negative for difficulty urinating, dysuria, flank pain and hematuria.  Musculoskeletal: Negative for back pain, myalgias, neck pain and neck stiffness.  Skin: Negative for rash and wound.  Neurological: Positive for dizziness, light-headedness and numbness. Negative for syncope, weakness and headaches.  All other systems reviewed and are negative.    Physical Exam Updated Vital Signs BP (!) 148/75 (BP Location: Right Arm)   Pulse 71   Temp 99 F (37.2 C) (Oral)   Resp 18   Ht 5\' 3"  (1.6 m)   Wt 83.5 kg (184 lb)   SpO2 99%   BMI 32.59 kg/m   Physical Exam  Constitutional: She is oriented to person, place, and time. She appears well-developed and well-nourished. No distress.  HENT:  Head: Normocephalic and atraumatic.  Mouth/Throat: Oropharynx is clear and moist. No oropharyngeal exudate.  Eyes: EOM are normal. Pupils are equal, round, and reactive to light.  No nystagmus  Neck: Normal range of motion. Neck supple.  Cardiovascular: Normal rate and regular rhythm. Exam reveals no gallop and no friction rub.  No murmur heard. Pulmonary/Chest: Effort normal and breath sounds normal. No stridor. No respiratory distress. She has no wheezes. She has no rales. She exhibits no tenderness.  Abdominal: Soft. Bowel sounds are normal. There is no tenderness. There is no rebound and no guarding.  Musculoskeletal: Normal range of motion. She exhibits no edema or tenderness.  Negative straight leg raise bilaterally.  No midline thoracic or lumbar tenderness.  No lower extremity swelling or asymmetry.  Distal pulses are 2+.  Lymphadenopathy:    She has no cervical adenopathy.  Neurological: She is alert and oriented to person, place, and time.  Patient is alert and oriented x3 with clear, goal oriented speech. Patient has 5/5 motor in all extremities.  Patient  does have some decreased sensation to the left upper anterior thigh.  Bilateral finger-to-nose is normal with no signs of dysmetria. Patient has a normal gait and walks without assistance.  Skin: Skin is warm and dry. Capillary refill takes less than 2 seconds. No rash noted. She is not diaphoretic. No erythema.  Psychiatric: She has a normal mood and affect. Her behavior is normal.  Nursing note and vitals reviewed.    ED Treatments / Results  Labs (all labs ordered are listed, but only abnormal results are displayed) Labs Reviewed  BASIC METABOLIC PANEL - Abnormal; Notable for the following components:      Result Value   Glucose, Bld 114 (*)    Creatinine, Ser 1.09 (*)    GFR calc non Af Amer 51 (*)    GFR calc Af Amer 59 (*)    All other components within normal limits  URINALYSIS, ROUTINE W REFLEX MICROSCOPIC - Abnormal; Notable for the following components:   Color, Urine STRAW (*)    All other components within normal  limits  CBC  I-STAT TROPONIN, ED    EKG  EKG Interpretation  Date/Time:  Saturday December 22 2017 13:56:29 EST Ventricular Rate:  81 PR Interval:  162 QRS Duration: 68 QT Interval:  362 QTC Calculation: 420 R Axis:   -4 Text Interpretation:  Normal sinus rhythm Normal ECG Confirmed by Julianne Rice [phone]) on 12/22/2017 9:14:54 PM       Radiology Ct Head Wo Contrast  Result Date: 12/22/2017 CLINICAL DATA:  Intermittent dizziness for 3 weeks. EXAM: CT HEAD WITHOUT CONTRAST TECHNIQUE: Contiguous axial images were obtained from the base of the skull through the vertex without intravenous contrast. COMPARISON:  December 18, 2016 FINDINGS: Brain: No evidence of acute infarction, hemorrhage, hydrocephalus, extra-axial collection or mass lesion/mass effect. Vascular: No hyperdense vessel or unexpected calcification. Skull: Normal. Negative for fracture or focal lesion. Sinuses/Orbits: No acute finding. Other: None. IMPRESSION: Normal study.  No cause for  dizziness identified. Electronically Signed   By: Dorise Bullion III M.D   On: 12/22/2017 17:54    Procedures Procedures (including critical care time)  Medications Ordered in ED Medications - No data to display   Initial Impression / Assessment and Plan / ED Course  I have reviewed the triage vital signs and the nursing notes.  Pertinent labs & imaging results that were available during my care of the patient were reviewed by me and considered in my medical decision making (see chart for details).    Patient presents with several weeks of intermittent dizziness.  Associated nausea.  Unclear whether this is vertiginous symptoms.  Has a normal neurologic exam.  CT head without acute findings.  No orthostatic hypotension.  Will treat with meclizine and have patient follow-up with her primary physician.  Regarding numbness of her left leg, this is a chronic condition for the past few decades.  Likely related to a lumbar radiculopathy.  No evidence of cauda equina or other emergent conditions.  Again patient is advised to follow with her primary physician for possible outpatient studies as needed.  Return precautions given.   Final Clinical Impressions(s) / ED Diagnoses   Final diagnoses:  Orthostatic dizziness  Lumbar radiculopathy    ED Discharge Orders        Ordered    meclizine (ANTIVERT) 25 MG tablet  3 times daily PRN     12/22/17 2111       Julianne Rice, MD 12/22/17 2115

## 2017-12-22 NOTE — ED Notes (Signed)
Patient transported to CT 

## 2017-12-26 NOTE — Progress Notes (Signed)
Subjective:  HPI: Eileen Cameron is a 67 y.o. female who presents for ED follow up of vertigo.  Please see Assessment and Plan below for the status of her chronic medical problems.  Review of Systems: Review of Systems  Constitutional: Negative for fever.  HENT: Negative for ear pain, hearing loss and tinnitus.   Eyes: Negative for blurred vision.  Respiratory: Negative for shortness of breath.   Cardiovascular: Negative for chest pain and leg swelling.  Gastrointestinal: Negative for abdominal pain.  Musculoskeletal: Positive for back pain and neck pain. Negative for falls.  Neurological: Positive for dizziness and headaches. Negative for sensory change, speech change and focal weakness.    Objective:  Physical Exam: Vitals:   12/27/17 0825  BP: 127/67  Pulse: 80  Temp: 97.9 F (36.6 C)  TempSrc: Oral  SpO2: 100%  Weight: 183 lb 3.2 oz (83.1 kg)  Height: 5\' 3"  (1.6 m)   Physical Exam  Constitutional: She is oriented to person, place, and time.  HENT:  Head: Normocephalic and atraumatic.  Eyes: EOM are normal. Pupils are equal, round, and reactive to light.  Cardiovascular: Normal rate and regular rhythm.  Pulmonary/Chest: Effort normal and breath sounds normal.  Abdominal: Soft. Bowel sounds are normal. She exhibits no distension. There is no tenderness. There is no guarding.  Musculoskeletal:  Tenderness and ropiness of cervical paraspinal musculature R>L  Neurological: She is alert and oriented to person, place, and time. She displays normal reflexes. No cranial nerve deficit.  dix hallpike negative bilaterally  Psychiatric: Affect normal.  Nursing note and vitals reviewed.  Assessment & Plan:  Essential hypertension HPI: Taking medication, wonders if dizziness could be caused by her BP meds after reading labels ect. Has started using [name] after last visit.  A: Essential HTN  P: Improved and at goal. Will have her continue olmesartan-hctz 40-25  daily Instructed that I dont think BP meds are causing her dizziness as it does not sound orthostatic.  Lumbar back pain with radiculopathy affecting right lower extremity HPI: Continue to have chronic LBP (several years) occasional mild radicular pain to left leg.  Benefited from PT in the past.  A: Chronic low back pain  P: Will refer back to PT, I think she would benefit from repeat evaluation and treatment of her back and neck pain. Also discussed importance of weight loss.  Fatigue due to sleep pattern disturbance Discussed needs sleep study, reports she tried to call without success after our last visit.  I will have our staff assist in getting appointment.  Neck pain, musculoskeletal HPI: Reports slightly worsened neck pain lately, mainly right side, asscoiated with mild right occipital squeezing HA.  Has been using some sort of massaging strap which helps.  A: Cervical neck pain  P: Pt eval and treat.  Vertigo HPI: Reports about 3 weeks of intermittent vertigo.  Typically happens when turning or laying down.  Has not noticed any vertigo on standing.  Associated with nausea.  No new numbness or tingling, no new weakness.  No hearing loss or tinnitus. Dix hallpike negative on exam.  A: Unspecified vertigo  P: Unclear etiology at this time, no neurologic deficits, dix hallpike negative at this time but sounds supisicious for BPPV/peripheral cause.  She had a normal CT of head in the ED.   She has meclizine with her, has not taken it yet. Will plan supportive care for now with meclizine, she could have has a mild viral labyrinthitis.  I discussed precautions and  she will let me know if worse for closer reevaluation.  I do not think this is due to her BP meds and her blood pressure is actually much better today.    Medications Ordered No orders of the defined types were placed in this encounter.  Other Orders Orders Placed This Encounter  Procedures  . PT eval and treat     Right cervical neck pain and chronic Low back pain with left radiculopathy    Standing Status:   Future    Standing Expiration Date:   12/27/2018   Follow Up: Return in about 6 months (around 06/26/2018), or if symptoms worsen or fail to improve.

## 2017-12-27 ENCOUNTER — Other Ambulatory Visit: Payer: Self-pay

## 2017-12-27 ENCOUNTER — Ambulatory Visit
Admission: RE | Admit: 2017-12-27 | Discharge: 2017-12-27 | Disposition: A | Discharge status: Home / Self Care | Payer: Medicare Other | Source: Ambulatory Visit | Attending: Internal Medicine | Admitting: Internal Medicine

## 2017-12-27 ENCOUNTER — Ambulatory Visit (INDEPENDENT_AMBULATORY_CARE_PROVIDER_SITE_OTHER): Payer: Medicare Other | Admitting: Internal Medicine

## 2017-12-27 VITALS — BP 127/67 | HR 80 | Temp 97.9°F | Ht 63.0 in | Wt 183.2 lb

## 2017-12-27 DIAGNOSIS — R5383 Other fatigue: Secondary | ICD-10-CM

## 2017-12-27 DIAGNOSIS — G479 Sleep disorder, unspecified: Secondary | ICD-10-CM | POA: Diagnosis not present

## 2017-12-27 DIAGNOSIS — R51 Headache: Secondary | ICD-10-CM

## 2017-12-27 DIAGNOSIS — M5416 Radiculopathy, lumbar region: Secondary | ICD-10-CM

## 2017-12-27 DIAGNOSIS — R42 Dizziness and giddiness: Secondary | ICD-10-CM

## 2017-12-27 DIAGNOSIS — Z79899 Other long term (current) drug therapy: Secondary | ICD-10-CM | POA: Diagnosis not present

## 2017-12-27 DIAGNOSIS — E669 Obesity, unspecified: Secondary | ICD-10-CM

## 2017-12-27 DIAGNOSIS — G8929 Other chronic pain: Secondary | ICD-10-CM | POA: Diagnosis not present

## 2017-12-27 DIAGNOSIS — M542 Cervicalgia: Secondary | ICD-10-CM

## 2017-12-27 DIAGNOSIS — M545 Low back pain: Secondary | ICD-10-CM | POA: Diagnosis not present

## 2017-12-27 DIAGNOSIS — Z1231 Encounter for screening mammogram for malignant neoplasm of breast: Secondary | ICD-10-CM | POA: Diagnosis not present

## 2017-12-27 DIAGNOSIS — E66811 Obesity, class 1: Secondary | ICD-10-CM

## 2017-12-27 DIAGNOSIS — I10 Essential (primary) hypertension: Secondary | ICD-10-CM | POA: Diagnosis not present

## 2017-12-27 HISTORY — DX: Cervicalgia: M54.2

## 2017-12-27 NOTE — Assessment & Plan Note (Signed)
HPI: Reports slightly worsened neck pain lately, mainly right side, asscoiated with mild right occipital squeezing HA.  Has been using some sort of massaging strap which helps.  A: Cervical neck pain  P: Pt eval and treat.

## 2017-12-27 NOTE — Assessment & Plan Note (Signed)
HPI: Continue to have chronic LBP (several years) occasional mild radicular pain to left leg.  Benefited from PT in the past.  A: Chronic low back pain  P: Will refer back to PT, I think she would benefit from repeat evaluation and treatment of her back and neck pain. Also discussed importance of weight loss.

## 2017-12-27 NOTE — Assessment & Plan Note (Signed)
Discussed needs sleep study, reports she tried to call without success after our last visit.  I will have our staff assist in getting appointment.

## 2017-12-27 NOTE — Patient Instructions (Signed)
I am going to send you to physical therapy for your neck and back pain.  You may take the meclizine as needed for vertigo, if this continues to be bothersome let me know and I may send you to vestibular rehab.

## 2017-12-27 NOTE — Assessment & Plan Note (Signed)
HPI: Taking medication, wonders if dizziness could be caused by her BP meds after reading labels ect. Has started using Eileen Cameron after last visit.  A: Essential HTN  P: Improved and at goal. Will have her continue olmesartan-hctz 40-25 daily Instructed that I dont think BP meds are causing her dizziness as it does not sound orthostatic.

## 2017-12-27 NOTE — Assessment & Plan Note (Signed)
HPI: Reports about 3 weeks of intermittent vertigo.  Typically happens when turning or laying down.  Has not noticed any vertigo on standing.  Associated with nausea.  No new numbness or tingling, no new weakness.  No hearing loss or tinnitus. Dix hallpike negative on exam.  A: Unspecified vertigo  P: Unclear etiology at this time, no neurologic deficits, dix hallpike negative at this time but sounds supisicious for BPPV/peripheral cause.  She had a normal CT of head in the ED.   She has meclizine with her, has not taken it yet. Will plan supportive care for now with meclizine, she could have has a mild viral labyrinthitis.  I discussed precautions and she will let me know if worse for closer reevaluation.  I do not think this is due to her BP meds and her blood pressure is actually much better today.

## 2018-01-14 ENCOUNTER — Telehealth: Payer: Self-pay | Admitting: Internal Medicine

## 2018-01-14 DIAGNOSIS — R42 Dizziness and giddiness: Secondary | ICD-10-CM

## 2018-01-14 NOTE — Telephone Encounter (Signed)
Patient is requesting an ENT referral for her Ears.  Unclear if she needs to be seen for the itching in her ears as patient states she discussed this at her last visit and she thought she was getting a referral for the ENT.

## 2018-01-14 NOTE — Telephone Encounter (Signed)
I have placed a referral for ENT.  She at last visit we discussed her vertigo, I believe this is what she is referring to, I did offer that if she was not improving I would refer her to ENT. I was not aware of ear itching specifically, if she wants she can be seen in [facility] to visualize her ear canals otherwise she can discuss this with ENT.

## 2018-01-14 NOTE — Telephone Encounter (Signed)
Thanks for your help!! I will let the patient know.

## 2018-01-22 DIAGNOSIS — J310 Chronic rhinitis: Secondary | ICD-10-CM | POA: Diagnosis not present

## 2018-01-22 DIAGNOSIS — G4733 Obstructive sleep apnea (adult) (pediatric): Secondary | ICD-10-CM | POA: Diagnosis not present

## 2018-01-22 DIAGNOSIS — R42 Dizziness and giddiness: Secondary | ICD-10-CM | POA: Diagnosis not present

## 2018-02-03 ENCOUNTER — Ambulatory Visit (INDEPENDENT_AMBULATORY_CARE_PROVIDER_SITE_OTHER): Payer: Medicare Other | Admitting: Neurology

## 2018-02-03 DIAGNOSIS — R0683 Snoring: Secondary | ICD-10-CM

## 2018-02-03 DIAGNOSIS — G2581 Restless legs syndrome: Secondary | ICD-10-CM

## 2018-02-03 DIAGNOSIS — R0681 Apnea, not elsewhere classified: Secondary | ICD-10-CM

## 2018-02-03 DIAGNOSIS — E669 Obesity, unspecified: Secondary | ICD-10-CM

## 2018-02-03 DIAGNOSIS — G4719 Other hypersomnia: Secondary | ICD-10-CM

## 2018-02-03 DIAGNOSIS — G4710 Hypersomnia, unspecified: Secondary | ICD-10-CM | POA: Diagnosis not present

## 2018-02-03 DIAGNOSIS — G4720 Circadian rhythm sleep disorder, unspecified type: Secondary | ICD-10-CM

## 2018-02-05 ENCOUNTER — Telehealth: Payer: Self-pay

## 2018-02-05 NOTE — Procedures (Signed)
PATIENT'S NAME:  Eileen Cameron, Eileen Cameron DOB:      08/25/1951      MR#:    [id number]     DATE OF RECORDING: 02/03/2018 REFERRING M.D.:  Joni Reining, DO Study Performed:   Baseline Polysomnogram HISTORY: 67 year old right-handed woman with a history of hypertension, reflux disease, vitamin D deficiency, seasonal allergies, and obesity, who reports snoring and excessive daytime somnolence, and difficulty initiating and maintaining sleep. Her Epworth sleepiness score is 4/24 today. The patient's weight 187 pounds with a height of 63 (inches), resulting in a BMI of 33.2 kg/m2. The patient's neck circumference measured 14.4 inches.  CURRENT MEDICATIONS: Cetirizine, Doxylamine, Esomeprazole, Fluticasone and Olmesartan.   PROCEDURE:  This is a multichannel digital polysomnogram utilizing the Somnostar 11.2 system.  Electrodes and sensors were applied and monitored per AASM Specifications.   EEG, EOG, Chin and Limb EMG, were sampled at 200 Hz.  ECG, Snore and Nasal Pressure, Thermal Airflow, Respiratory Effort, CPAP Flow and Pressure, Oximetry was sampled at 50 Hz. Digital video and audio were recorded.      BASELINE STUDY  Lights Out was at 21:11 and Lights On at 05:00.  Total recording time (TRT) was 470 minutes, with a total sleep time (TST) of  280.5 minutes.  The patient's sleep latency was 106 minutes, which is delayed. REM latency was 96 minutes, which is normal.  The sleep efficiency was 59.7%, which is reduced.      SLEEP ARCHITECTURE: WASO (Wake after sleep onset) was 104.5 minutes with mild to moderate sleep fragmentation noted. There were 18.5 minutes in Stage N1, 181 minutes Stage N2, 0 minutes Stage N3 and 81 minutes in Stage REM.  The percentage of Stage N1 was 6.6%, Stage N2 was 64.5%, which is increased, Stage N3 was absent, and Stage R (REM sleep) was 28.9%, which is increased. The arousals were noted as: 22 were spontaneous, 0 were associated with PLMs, 6 were associated with respiratory  events.  Audio and video analysis did not show any abnormal or unusual movements, behaviors, phonations or vocalizations. The patient took 1 bathroom break. No significant snoring was noted. The EKG was in keeping with normal sinus rhythm (NSR).  RESPIRATORY ANALYSIS:  There were a total of 6 respiratory events:  0 obstructive apneas, 0 central apneas and 0 mixed apneas with a total of 0 apneas and an apnea index (AI) of 0 /hour. There were 6 hypopneas with a hypopnea index of 1.3 /hour. The patient also had 0 respiratory event related arousals (RERAs).      The total APNEA/HYPOPNEA INDEX (AHI) was 1.3/hour and the total RESPIRATORY DISTURBANCE INDEX was 1.3 /hour.  4 events occurred in REM sleep and 4 events in NREM. The REM AHI was 3. /hour, versus a non-REM AHI of .6. The patient spent 51 minutes of total sleep time in the supine position and 230 minutes in non-supine.. The supine AHI was 5.9 versus a non-supine AHI of 0.3.  OXYGEN SATURATION & C02:  The Wake baseline 02 saturation was 95%, with the lowest being 87%. Time spent below 89% saturation equaled 3 minutes.  PERIODIC LIMB MOVEMENTS: The patient had a total of 0 Periodic Limb Movements.  The Periodic Limb Movement (PLM) index was 0 and the PLM Arousal index was 0/hour.  Post-study, the patient indicated that sleep was the same as usual.    IMPRESSION:  1. Dysfunctions associated with sleep stages or arousal from sleep  RECOMMENDATIONS:  1. This study does not demonstrate any significant obstructive  or central sleep disordered breathing with the exception of borderline OSA during supine sleep. For this, avoiding the supine sleep position and weight loss will likely suffice as treatment. This study does not support an intrinsic sleep disorder as a cause of the patient's symptoms. Other causes, including circadian rhythm disturbances, an underlying mood disorder, medication effect and/or an underlying medical problem cannot be ruled  out. 2. This study shows sleep fragmentation and abnormal sleep stage percentages; these are nonspecific findings and per se do not signify an intrinsic sleep disorder or a cause for the patient's sleep-related symptoms. Causes include (but are not limited to) the first night effect of the sleep study, circadian rhythm disturbances, medication effect or an underlying mood disorder or medical problem.  3. The patient should be cautioned not to drive, work at heights, or operate dangerous or heavy equipment when tired or sleepy. Review and reiteration of good sleep hygiene measures should be pursued with any patient. 4. The patient can follow-up with her referring provider, who will be notified of the test results.  I certify that I have reviewed the entire raw data recording prior to the issuance of this report in accordance with the Standards of Accreditation of the American Academy of Sleep Medicine (AASM)   Star Age, MD, PhD Diplomat, American Board of Psychiatry and Neurology (Neurology and Sleep Medicine)

## 2018-02-05 NOTE — Progress Notes (Signed)
Patient referred by Dr. Heber [place], seen by me on 09/12/17, diagnostic PSG on 02/03/18.   Please call and notify the patient that the recent sleep study did not show any significant obstructive sleep apnea. She had some difficulty falling asleep and staying asleep, no leg twitching. Please inform patient that she can FU with her PCP.  Please remind patient to try to maintain good sleep hygiene, which means: Keep a regular sleep and wake schedule and make enough time for sleep (7 1/2 to 8 1/2 hours for the average adult), try not to exercise or have a meal within 2 hours of your bedtime, try to keep your bedroom conducive for sleep, that is, cool and dark, without light distractors such as an illuminated alarm clock, and refrain from watching TV right before sleep or in the middle of the night and do not keep the TV or radio on during the night. If a nightlight is used, have it away from the visual field. Also, try not to use or play on electronic devices at bedtime, such as your cell phone, tablet PC or laptop. If you like to read at bedtime on an electronic device, try to dim the background light as much as possible. Do not eat in the middle of the night. Keep pets away from the bedroom environment. For stress relief, try meditation, deep breathing exercises (there are many books and CDs available), a white noise machine or fan can help to diffuse other noise distractors, such as traffic noise. Do not drink alcohol before bedtime, as it can disturb sleep and cause middle of the night awakenings. Never mix alcohol and sedating medications! Avoid narcotic pain medication close to bedtime, as opioids/narcotics can suppress breathing drive and breathing effort.    Thanks,  Star Age, MD, PhD [facility])

## 2018-02-05 NOTE — Telephone Encounter (Signed)
I called pt to discuss her sleep study results. No answer, left a message asking her to call me back. 

## 2018-02-05 NOTE — Telephone Encounter (Signed)
I called pt. I advised pt that Dr. Rexene Alberts reviewed pt's sleep study and found that pt did not have any significant osa. Pt did have difficulty falling asleep and staying asleep but had no leg twitching. Dr. Rexene Alberts recommends that pt follow up with her PCP. I reviewed sleep hygiene recommendations with the pt, including trying to keep a regular sleep wake schedule, avoiding electronics in the bedroom, keeping the bedroom cool, dark, and quiet, and avoiding eating or exercising within 2 hours of bedtime as well as eating in the middle of the night. I advised pt to keep pets out of the bedroom. I discussed with pt the importance of stress relief and to try meditation, deep breathing exercises, and/or a white noise machine or fan to diffuse other noise distractors. I advised pt to not drink alcohol before bedtime and to never mix alcohol and sedating medications. Pt was advised to avoid narcotic pain medication close to bedtime. I advised pt that a copy of these sleep study results will be sent to Dr. Heber [place]. Pt verbalized understanding of results. Pt had no questions at this time but was encouraged to call back if questions arise.

## 2018-02-05 NOTE — Telephone Encounter (Signed)
-----   Message from Star Age, MD sent at 02/05/2018  8:21 AM EDT ----- Patient referred by Dr. Heber [place], seen by me on 09/12/17, diagnostic PSG on 02/03/18.   Please call and notify the patient that the recent sleep study did not show any significant obstructive sleep apnea. She had some difficulty falling asleep and staying asleep, no leg twitching. Please inform patient that she can FU with her PCP.  Please remind patient to try to maintain good sleep hygiene, which means: Keep a regular sleep and wake schedule and make enough time for sleep (7 1/2 to 8 1/2 hours for the average adult), try not to exercise or have a meal within 2 hours of your bedtime, try to keep your bedroom conducive for sleep, that is, cool and dark, without light distractors such as an illuminated alarm clock, and refrain from watching TV right before sleep or in the middle of the night and do not keep the TV or radio on during the night. If a nightlight is used, have it away from the visual field. Also, try not to use or play on electronic devices at bedtime, such as your cell phone, tablet PC or laptop. If you like to read at bedtime on an electronic device, try to dim the background light as much as possible. Do not eat in the middle of the night. Keep pets away from the bedroom environment. For stress relief, try meditation, deep breathing exercises (there are many books and CDs available), a white noise machine or fan can help to diffuse other noise distractors, such as traffic noise. Do not drink alcohol before bedtime, as it can disturb sleep and cause middle of the night awakenings. Never mix alcohol and sedating medications! Avoid narcotic pain medication close to bedtime, as opioids/narcotics can suppress breathing drive and breathing effort.    Thanks,  Star Age, MD, PhD [facility])

## 2018-02-12 ENCOUNTER — Other Ambulatory Visit: Payer: Self-pay | Admitting: *Deleted

## 2018-02-12 DIAGNOSIS — I10 Essential (primary) hypertension: Secondary | ICD-10-CM

## 2018-02-12 MED ORDER — OLMESARTAN MEDOXOMIL-HCTZ 40-25 MG PO TABS
1.0000 | ORAL_TABLET | Freq: Every day | ORAL | 3 refills | Status: DC
Start: 2018-02-12 — End: 2019-01-17

## 2018-02-14 ENCOUNTER — Ambulatory Visit (HOSPITAL_COMMUNITY)
Admission: RE | Admit: 2018-02-14 | Discharge: 2018-02-14 | Disposition: A | Discharge status: Home / Self Care | Payer: Medicare Other | Source: Ambulatory Visit | Attending: Family Medicine | Admitting: Family Medicine

## 2018-02-14 ENCOUNTER — Ambulatory Visit (INDEPENDENT_AMBULATORY_CARE_PROVIDER_SITE_OTHER): Payer: Medicare Other | Admitting: Internal Medicine

## 2018-02-14 VITALS — BP 145/75 | HR 68 | Temp 97.9°F | Ht 63.0 in | Wt 182.9 lb

## 2018-02-14 DIAGNOSIS — G479 Sleep disorder, unspecified: Secondary | ICD-10-CM | POA: Diagnosis not present

## 2018-02-14 DIAGNOSIS — Z79899 Other long term (current) drug therapy: Secondary | ICD-10-CM

## 2018-02-14 DIAGNOSIS — M159 Polyosteoarthritis, unspecified: Secondary | ICD-10-CM | POA: Insufficient documentation

## 2018-02-14 DIAGNOSIS — R002 Palpitations: Secondary | ICD-10-CM | POA: Insufficient documentation

## 2018-02-14 DIAGNOSIS — R42 Dizziness and giddiness: Secondary | ICD-10-CM | POA: Diagnosis not present

## 2018-02-14 DIAGNOSIS — R011 Cardiac murmur, unspecified: Secondary | ICD-10-CM | POA: Diagnosis not present

## 2018-02-14 DIAGNOSIS — H43393 Other vitreous opacities, bilateral: Secondary | ICD-10-CM

## 2018-02-14 DIAGNOSIS — R5383 Other fatigue: Secondary | ICD-10-CM | POA: Diagnosis not present

## 2018-02-14 DIAGNOSIS — M1711 Unilateral primary osteoarthritis, right knee: Secondary | ICD-10-CM | POA: Diagnosis not present

## 2018-02-14 DIAGNOSIS — J302 Other seasonal allergic rhinitis: Secondary | ICD-10-CM

## 2018-02-14 DIAGNOSIS — R5382 Chronic fatigue, unspecified: Secondary | ICD-10-CM

## 2018-02-14 DIAGNOSIS — I10 Essential (primary) hypertension: Secondary | ICD-10-CM

## 2018-02-14 NOTE — Progress Notes (Signed)
Subjective:  HPI: [name] is a 67 y.o. female who presents for f/u of HTN and chronic fatigue.   Please see Assessment and Plan below for the status of her chronic medical problems.  Review of Systems: Review of Systems  Constitutional: Positive for malaise/fatigue.  HENT: Negative for tinnitus.   Respiratory: Negative for cough and shortness of breath.   Cardiovascular: Positive for palpitations. Negative for chest pain, claudication and leg swelling.  Musculoskeletal: Positive for joint pain.  Skin: Positive for itching (ears).  Neurological: Positive for dizziness.  Psychiatric/Behavioral: The patient has insomnia.     Objective:  Physical Exam: Vitals:   02/14/18 0949 02/14/18 1035  BP: (!) 129/98 (!) 145/75  Pulse: 75 68  Temp: 97.9 F (36.6 C)   TempSrc: Oral   SpO2: 100%   Weight: 182 lb 14.4 oz (83 kg)   Height: 5\' 3"  (1.6 m)    Physical Exam  Constitutional: She appears well-developed and well-nourished.  Cardiovascular: Normal rate and regular rhythm.  Murmur (mild systolic) heard. Pulmonary/Chest: Effort normal and breath sounds normal. She has no wheezes. She has no rales.  Musculoskeletal: She exhibits no edema.       Right knee: She exhibits normal alignment, no LCL laxity, normal meniscus and no MCL laxity. Tenderness found. Medial joint line tenderness noted. No lateral joint line tenderness noted.       Left knee: She exhibits normal alignment, no LCL laxity, normal meniscus and no MCL laxity. No tenderness found. No medial joint line and no lateral joint line tenderness noted.  Psychiatric:  Mild anxious  Nursing note and vitals reviewed.  Assessment & Plan:  Essential hypertension HPI: No issues with medication.  A: Essential HTN, diastolic above goal  P: Continue Olmesartan-HCTZ 40-25, reinforced low salt diet.  Primary osteoarthritis of right knee HPI: Continues to have some mild right knee pain, <15 minutes of stiffness.  Not too  problematic, occasionally uses OTC cream.  A: OA of right knee  P; Discussed treatments of topical NSAIDS, PT, injections, pain is mild right now and we will just treat conservatively.  Seasonal allergies HPI: C/o stuffy nose recently.  Otherwise doing well, not taking zyrtec  A: Seasonal allergies  P: Discussed again OTC Zyrtec 10mg  daily  Fatigue due to sleep pattern disturbance HPI: continues to feel fatigued, still not getting great sleep (restless), has tried to implement some of sleep hygiene discussed by Dr Rexene Alberts.  A: Fatigue due to sleep pattern disturbance  P: Reinforced sleep hygeine, continue unisom if helping (patient not very clear on this)   Vertigo HPI: Vertigo improving, saw dr  Obey with ENT, suspect labrenthitis and gradual improvement.  A: Vertigo, improving   Visual floaters, bilateral HPI: Patient reprots some bilateral floaters in her vision, no eye pain, no blurry vision.  A: Visual floaters  P:Referral to Opthomology  Palpitations HPI: Patient reports intermittent episodes of palpitation, not very clear with me about the exact timing of these episodes, happen more often at night, maybe 1-2 times a month, but is also not clear about when the last episode was "maybe" >1 month ago.  A: Palpitations  P: Obtained EKG: nsr, no st or t wave changes Will refer over to Cardiology, I am concerned for possible occult Afib, her last echo did show enlarged left atrium which increases my concern however a 30 day cardiac monitor may not be enough to detect this, I wonder if she may benefit more from a Loop recorder or other device.  Medications Ordered No orders of the defined types were placed in this encounter.  Other Orders Orders Placed This Encounter  Procedures  . CMP14 + Anion Gap  . TSH  . CBC with Diff  . Ambulatory referral to Ophthalmology    Referral Priority:   Routine    Referral Type:   Consultation    Referral Reason:   Specialty  Services Required    Requested Specialty:   Ophthalmology    Number of Visits Requested:   1  . Ambulatory referral to Cardiology    Referral Priority:   Routine    Referral Type:   Consultation    Referral Reason:   Specialty Services Required    Requested Specialty:   Cardiology    Number of Visits Requested:   1  . EKG 12-Lead   Follow Up: Return in about 6 months (around 08/16/2018).

## 2018-02-14 NOTE — Patient Instructions (Signed)
I will call with the lab results.

## 2018-02-15 LAB — CBC WITH DIFFERENTIAL/PLATELET
BASOS ABS: 0 10*3/uL (ref 0.0–0.2)
BASOS: 1 %
EOS (ABSOLUTE): 0 10*3/uL (ref 0.0–0.4)
Eos: 1 %
Hematocrit: 39.6 % (ref 34.0–46.6)
Hemoglobin: 13.4 g/dL (ref 11.1–15.9)
IMMATURE GRANULOCYTES: 0 %
Immature Grans (Abs): 0 10*3/uL (ref 0.0–0.1)
Lymphocytes Absolute: 1.8 10*3/uL (ref 0.7–3.1)
Lymphs: 47 %
MCH: 30.3 pg (ref 26.6–33.0)
MCHC: 33.8 g/dL (ref 31.5–35.7)
MCV: 90 fL (ref 79–97)
MONOS ABS: 0.4 10*3/uL (ref 0.1–0.9)
Monocytes: 10 %
Neutrophils Absolute: 1.6 10*3/uL (ref 1.4–7.0)
Neutrophils: 41 %
PLATELETS: 293 10*3/uL (ref 150–379)
RBC: 4.42 x10E6/uL (ref 3.77–5.28)
RDW: 13.8 % (ref 12.3–15.4)
WBC: 3.9 10*3/uL (ref 3.4–10.8)

## 2018-02-15 LAB — CMP14 + ANION GAP
ALBUMIN: 4.5 g/dL (ref 3.6–4.8)
ALT: 7 IU/L (ref 0–32)
ANION GAP: 19 mmol/L — AB (ref 10.0–18.0)
AST: 19 IU/L (ref 0–40)
Albumin/Globulin Ratio: 1.6 (ref 1.2–2.2)
Alkaline Phosphatase: 98 IU/L (ref 39–117)
BUN / CREAT RATIO: 15 (ref 12–28)
BUN: 13 mg/dL (ref 8–27)
Bilirubin Total: 0.4 mg/dL (ref 0.0–1.2)
CALCIUM: 9.4 mg/dL (ref 8.7–10.3)
CO2: 24 mmol/L (ref 20–29)
CREATININE: 0.89 mg/dL (ref 0.57–1.00)
Chloride: 99 mmol/L (ref 96–106)
GFR calc Af Amer: 76 mL/min/{1.73_m2} (ref >59)
GFR, EST NON AFRICAN AMERICAN: 66 mL/min/{1.73_m2} (ref >59)
GLOBULIN, TOTAL: 2.8 g/dL (ref 1.5–4.5)
Glucose: 86 mg/dL (ref 65–99)
Potassium: 4.1 mmol/L (ref 3.5–5.2)
SODIUM: 142 mmol/L (ref 134–144)
Total Protein: 7.3 g/dL (ref 6.0–8.5)

## 2018-02-15 LAB — TSH: TSH: 1.05 u[IU]/mL (ref 0.450–4.500)

## 2018-02-18 DIAGNOSIS — R002 Palpitations: Secondary | ICD-10-CM | POA: Insufficient documentation

## 2018-02-18 DIAGNOSIS — H43393 Other vitreous opacities, bilateral: Secondary | ICD-10-CM | POA: Insufficient documentation

## 2018-02-18 NOTE — Assessment & Plan Note (Signed)
HPI: Vertigo improving, saw dr  Obey with ENT, suspect labrenthitis and gradual improvement.  A: Vertigo, improving

## 2018-02-18 NOTE — Assessment & Plan Note (Signed)
HPI: Patient reports intermittent episodes of palpitation, not very clear with me about the exact timing of these episodes, happen more often at night, maybe 1-2 times a month, but is also not clear about when the last episode was "maybe" >1 month ago.  A: Palpitations  P: Obtained EKG: nsr, no st or t wave changes Will refer over to Cardiology, I am concerned for possible occult Afib, her last echo did show enlarged left atrium which increases my concern however a 30 day cardiac monitor may not be enough to detect this, I wonder if she may benefit more from a Loop recorder or other device.

## 2018-02-18 NOTE — Assessment & Plan Note (Signed)
HPI: continues to feel fatigued, still not getting great sleep (restless), has tried to implement some of sleep hygiene discussed by Dr Rexene Alberts.  A: Fatigue due to sleep pattern disturbance  P: Reinforced sleep hygeine, continue unisom if helping (patient not very clear on this)

## 2018-02-18 NOTE — Assessment & Plan Note (Signed)
HPI: No issues with medication.  A: Essential HTN, diastolic above goal  P: Continue Olmesartan-HCTZ 40-25, reinforced low salt diet.

## 2018-02-18 NOTE — Assessment & Plan Note (Signed)
HPI: C/o stuffy nose recently.  Otherwise doing well, not taking zyrtec  A: Seasonal allergies  P: Discussed again OTC Zyrtec 10mg  daily

## 2018-02-18 NOTE — Assessment & Plan Note (Signed)
HPI: Continues to have some mild right knee pain, <15 minutes of stiffness.  Not too problematic, occasionally uses OTC cream.  A: OA of right knee  P; Discussed treatments of topical NSAIDS, PT, injections, pain is mild right now and we will just treat conservatively.

## 2018-02-18 NOTE — Assessment & Plan Note (Signed)
HPI: Patient reprots some bilateral floaters in her vision, no eye pain, no blurry vision.  A: Visual floaters  P:Referral to Opthomology

## 2018-02-21 DIAGNOSIS — H25013 Cortical age-related cataract, bilateral: Secondary | ICD-10-CM | POA: Diagnosis not present

## 2018-02-21 DIAGNOSIS — H2513 Age-related nuclear cataract, bilateral: Secondary | ICD-10-CM | POA: Diagnosis not present

## 2018-02-21 DIAGNOSIS — H11829 Conjunctivochalasis, unspecified eye: Secondary | ICD-10-CM | POA: Diagnosis not present

## 2018-02-21 DIAGNOSIS — H43393 Other vitreous opacities, bilateral: Secondary | ICD-10-CM | POA: Diagnosis not present

## 2018-03-05 ENCOUNTER — Ambulatory Visit (INDEPENDENT_AMBULATORY_CARE_PROVIDER_SITE_OTHER): Payer: Medicare Other | Admitting: Internal Medicine

## 2018-03-05 VITALS — BP 143/66 | HR 72 | Temp 98.2°F | Wt 182.0 lb

## 2018-03-05 DIAGNOSIS — R0982 Postnasal drip: Secondary | ICD-10-CM | POA: Diagnosis not present

## 2018-03-05 DIAGNOSIS — Z79899 Other long term (current) drug therapy: Secondary | ICD-10-CM

## 2018-03-05 DIAGNOSIS — M5416 Radiculopathy, lumbar region: Secondary | ICD-10-CM

## 2018-03-05 DIAGNOSIS — M1711 Unilateral primary osteoarthritis, right knee: Secondary | ICD-10-CM | POA: Diagnosis present

## 2018-03-05 DIAGNOSIS — I10 Essential (primary) hypertension: Secondary | ICD-10-CM

## 2018-03-05 MED ORDER — FLUTICASONE PROPIONATE 50 MCG/ACT NA SUSP: 1.0000 | Freq: Every day | NASAL | 2 refills | Status: DC | PRN

## 2018-03-05 MED ORDER — DICLOFENAC SODIUM 1 % TD GEL: 2.0000 g | Freq: Four times a day (QID) | TRANSDERMAL | 1 refills | Status: DC

## 2018-03-05 NOTE — Patient Instructions (Addendum)
FOLLOW-UP INSTRUCTIONS When: 6 months with PCP For: health maintenance What to bring: medications    Ms. Leclere,  It was good to meet you today.  Your knee pain is most likely from osteoarthritis. You can try Voltaren gel up to four times daily as needed for the pain. We are also sending a referral to your physical therapy. The biggest thing that will be helpful is to continue working on losing weight. If these conservative treatments do not help, we can then talk about other kinds of medicines or an injection.

## 2018-03-05 NOTE — Assessment & Plan Note (Signed)
Stable. Reports increased symptoms with seasonal change. - Refilled flonase

## 2018-03-05 NOTE — Assessment & Plan Note (Addendum)
Assessment Patient reports increased right knee pain since her last visit with PCP about 3 weeks ago. At that time, the decision was made to treat conservatively as the pain was more mild, which included all, over-the-counter creams, and heating pads.  She reports that despite these measures, she continues to have constant pain that is worse at the end of the day.  She states that the pain has now made it difficult for her to walk.  The pain is mostly localized to her medial knee with occasional pains on the superior lateral aspect.  She thinks that her knee may be more swollen at the back.  She denies recent travel or subjective fevers.  She has never tried PT for her knee.  No swelling, erythema, or warmth on exam.  Patient is mostly tender to palpation on medial joint line of her right knee.  Right knee pain consistent with worsening osteoarthritis.  We will continue further conservative therapies and discussed with patient more invasive procedures like injections if still no relief. Also discussed importance of weight loss.  Plan - Counseled on weight loss - Voltaren gel up to 4 times daily as needed - Referral to PT placed - Can consider duloxetine or steroid injection if still no relief

## 2018-03-05 NOTE — Progress Notes (Signed)
   CC: right knee pain  HPI:  [name] is a 67 y.o. female with PMH of HTN, OA of right knee, and lumbar back pain with right sided radiculopathy who presents with worsening right knee pain.  Please see the assessment and plan below for the status of the patient's chronic medical problems.  Past Medical History:  Diagnosis Date  . Adrenal incidentaloma ([place]) 07/08/2015   1.0 by 1.4 cm lateral limb left adrenal mass on CT abdomen on 07/08/15 Followed up in April 2018, stable no further imaging required.  . Allergy    seasonal allergies, ace inhibitors--angioedema  . Hyperlipidemia   . Hypertension    Review of Systems:   GEN: Negative for fevers MSK: Positive for pain in right knee. Positive for swelling of R posterior knee?  Physical Exam:  Vitals:   03/05/18 0936  BP: (!) 143/66  Pulse: 72  Temp: 98.2 F (36.8 C)  TempSrc: Oral  SpO2: 99%  Weight: 182 lb (82.6 kg)   GEN: Overweight woman sitting comfortably in chair in NAD MSK: TTP of medial joint line of right knee. No swelling, erythema, or warmth of right knee. No right calf pain.  Assessment & Plan:   See Encounters Tab for problem based charting.  Patient discussed with Dr. Lynnae January

## 2018-03-07 NOTE — Progress Notes (Signed)
Internal Medicine Clinic Attending  Case discussed with Dr. Huang at the time of the visit.  We reviewed the resident's history and exam and pertinent patient test results.  I agree with the assessment, diagnosis, and plan of care documented in the resident's note. 

## 2018-03-12 ENCOUNTER — Ambulatory Visit: Payer: Medicare Other | Attending: Internal Medicine | Admitting: Physical Therapy

## 2018-03-12 DIAGNOSIS — M6281 Muscle weakness (generalized): Secondary | ICD-10-CM | POA: Diagnosis not present

## 2018-03-12 DIAGNOSIS — R262 Difficulty in walking, not elsewhere classified: Secondary | ICD-10-CM | POA: Insufficient documentation

## 2018-03-12 DIAGNOSIS — M25561 Pain in right knee: Secondary | ICD-10-CM | POA: Insufficient documentation

## 2018-03-12 NOTE — Therapy (Signed)
[place], [place], [address] Phone: [phone]   Fax:  [phone]  Physical Therapy Evaluation  Patient Details  Name: Eileen Cameron MRN: [id number] Date of Birth: Jan 09, 1951 Referring Provider: Dr. Joni Reining   Encounter Date: 03/12/2018  PT End of Session - 03/12/18 1014    Visit Number  1    Number of Visits  12    Date for PT Re-Evaluation  04/23/18    PT Start Time  8756    PT Stop Time  1106    PT Time Calculation (min)  51 min    Activity Tolerance  Patient tolerated treatment well    Behavior During Therapy  [facility] for tasks assessed/performed       Past Medical History:  Diagnosis Date  . Adrenal incidentaloma ([place]) 07/08/2015   1.0 by 1.4 cm lateral limb left adrenal mass on CT abdomen on 07/08/15 Followed up in April 2018, stable no further imaging required.  . Allergy    seasonal allergies, ace inhibitors--angioedema  . Hyperlipidemia   . Hypertension     Past Surgical History:  Procedure Laterality Date  . COLONOSCOPY N/A 01/08/2013   Procedure: COLONOSCOPY;  Surgeon: Arta Silence, MD;  Location: [facility];  Service: Endoscopy;  Laterality: N/A;  . ECTOPIC PREGNANCY SURGERY  1970s  . right breast abscess drainage    . TONSILLECTOMY    . TUBAL LIGATION      There were no vitals filed for this visit.   Subjective Assessment - 03/12/18 1013    Subjective  Patient reports new onset of Rt knee popping and pain (3 weeks ago).   She has pain, mild swelling and stiffness. Rarely buckles.  She has some stiffness when she sits for a long time, then goes to stand. She has never been told she had arthritis.  Some pain with longer periods of time, walking up stairs.      Patient is accompained by:  Family member grandson    Pertinent History  HTN, High cholesterol    Limitations  Lifting;Standing;Walking;House hold activities    How long can you stand comfortably?  has to sit when cooking  big meals    How long can you walk comfortably?  not sure    Diagnostic tests  No XR done recently    Patient Stated Goals  Patient would like to have less pain.      Currently in Pain?  Yes    Pain Score  4     Pain Location  Knee    Pain Orientation  Right;Other (Comment) joint line     Pain Descriptors / Indicators  Aching    Pain Type  Chronic pain    Pain Onset  1 to 4 weeks ago    Pain Frequency  Intermittent    Aggravating Factors   bending it, walking and standing    Pain Relieving Factors  sit and rest, ice pack    Effect of Pain on Daily Activities  limits her walking          [facility] PT Assessment - 03/12/18 0001      Assessment   Medical Diagnosis  R knee OA     Referring Provider  Dr. Joni Reining    Onset Date/Surgical Date  02/19/18    Next MD Visit  6 mos     Prior Therapy  yes for back       Precautions   Precautions  None  Restrictions   Weight Bearing Restrictions  No      Balance Screen   Has the patient fallen in the past 6 months  No      [place] residence    Living Arrangements  Alone    Type of [place] Access  Level entry    Home Layout  One level      Prior Function   Level of [place]  Retired    [employer]     Leisure  TV, family       Cognition   Overall Cognitive Status  Within Functional Limits for tasks assessed      Observation/Other Assessments   Focus on Therapeutic Outcomes (FOTO)   37%      Circumferential Edema   Circumferential - Right  19 inch    Circumferential - Left   18.5 inch       Sensation   Light Touch  Appears Intact      Functional Tests   Functional tests  Squat;Single leg stance      Squat   Comments  decent form, min pain       Single Leg Stance   Comments  less than 10 sec , incr effort on R LE      Posture/Postural Control   Posture Comments  not remarkable       AROM   Right  Knee Extension  0    Right Knee Flexion  119 min pain     Left Knee Extension  0    Left Knee Flexion  133      Strength   Right Hip Flexion  4/5    Right Hip Extension  3+/5    Right Hip ABduction  3+/5    Left Hip Flexion  4+/5    Left Hip Extension  3+/5    Left Hip ABduction  3+/5    Right Knee Flexion  4+/5    Right Knee Extension  4/5 pain     Left Knee Flexion  5/5    Left Knee Extension  4+/5      Palpation   Palpation comment  proximal to patella, medial joint line pain       Special Tests    Special Tests  Knee Special Tests      Patellofemoral Grind test (Clark's Sign)   Findings  Postive    Side   Right        Objective measurements completed on examination: See above findings.      [place] Adult PT Treatment/Exercise - 03/12/18 0001      Exercises   Exercises  Knee/Hip      Knee/Hip Exercises: Stretches   Active Hamstring Stretch  Both;2 reps;30 seconds      Knee/Hip Exercises: Aerobic   Nustep  5 min L 5 UE and LE              PT Education - 03/12/18 1013    Education provided  Yes    Education Details  PT, POC, HEP, anatomy, OA , RICE     Person(s) Educated  Patient    Methods  Explanation    Comprehension  Verbalized understanding          PT Long Term Goals - 03/12/18 1352      PT LONG TERM GOAL #1   Title  Patient will  be I with HEP for core/hip/knee strength and flexibility     Time  6    Period  Weeks    Status  New    Target Date  04/23/18      PT LONG TERM GOAL #2   Title  Patient will stand for 30 min without increased pain in order to perfrom IADLs     Time  6    Period  Weeks    Status  New    Target Date  04/23/18      PT LONG TERM GOAL #3   Title  Pt will be able to walk 1 mile with min to no increase in knee pain.     Time  6    Period  Weeks    Status  New    Target Date  04/23/18      PT LONG TERM GOAL #4   Title  Pt will be able to bend her Rt knee to 120 deg without increased pain for proper  transfer, sitting position     Time  6    Period  Weeks    Status  New    Target Date  04/23/18      PT LONG TERM GOAL #5   Title  Pt will increase hip strength to 4+/5 (abduction and extension) for maximal knee support.     Time  6    Period  Weeks    Status  New    Target Date  04/23/18             Plan - 03/12/18 1053    Clinical Impression Statement  Patient presents for low complexity eval of Rt knee pain with signs and symptoms consistent with osteoarthritis.  She has min hip weakness and pain in knee, decreased AROM and swelling in Rt knee.  She was given initial HEP for level 1 knee and felt good after she left the clinic.  She should do well with short episode of PT.      Clinical Presentation  Stable    Clinical Decision Making  Low    Rehab Potential  Excellent    PT Frequency  2x / week    PT Duration  6 weeks    PT Treatment/Interventions  ADLs/Self Care Home Management;Electrical Stimulation;Functional mobility training;Cryotherapy;Ultrasound;Moist Heat;Iontophoresis 4mg /ml Dexamethasone;Therapeutic activities;Therapeutic exercise;Patient/family education;Taping;Manual techniques    PT Next Visit Plan  check HEP, Nustep, consider tape for quad activation     PT Home Exercise Plan  hamstring stretch, knee ROM, LAQ, bridge and SLR     Consulted and Agree with Plan of Care  Patient       Patient will benefit from skilled therapeutic intervention in order to improve the following deficits and impairments:  Decreased balance, Decreased mobility, Difficulty walking, Hypomobility, Increased edema, Decreased range of motion, Decreased strength, Increased fascial restricitons, Pain, Impaired flexibility  Visit Diagnosis: Acute pain of right knee  Muscle weakness (generalized)  Difficulty in walking, not elsewhere classified     Problem List Patient Active Problem List   Diagnosis Date Noted  . Palpitations 02/18/2018  . Visual floaters, bilateral 02/18/2018  .  Primary osteoarthritis of right knee 02/14/2018  . Neck pain, musculoskeletal 12/27/2017  . Vertigo 12/27/2017  . Obesity (BMI 30.0-34.9) 08/09/2017  . Vitamin D insufficiency 08/07/2017  . Back pain 04/05/2017  . Myalgia due to statin 02/08/2017  . Left shoulder pain 02/08/2017  . Post-nasal drip 10/10/2016  . Risk for coronary artery  disease between 10% and 20% in next 10 years 08/11/2016  . History of statin therapy 08/10/2016  . Encounter for immunization 07/18/2016  . Right shoulder pain 04/20/2016  . Leg cramps 11/18/2015  . GERD (gastroesophageal reflux disease) 11/28/2014  . Itching 10/16/2014  . Insomnia 09/18/2014  . Seasonal allergies 12/31/2013  . Fatigue due to sleep pattern disturbance 05/13/2013  . Preventative health care 08/26/2012  . Essential hypertension 04/01/2009  . Lumbar back pain with radiculopathy affecting right lower extremity 04/01/2009    Xiao Graul 03/12/2018, 1:58 PM  [place], [place], [address] Phone: [phone]   Fax:  [phone]  Name: Eileen Cameron MRN: [id number] Date of Birth: Jan 07, 1951   Raeford Razor, PT 03/12/18 1:59 PM Phone: [phone] Fax: [phone]

## 2018-03-26 ENCOUNTER — Encounter: Payer: Self-pay | Admitting: Physical Therapy

## 2018-03-26 ENCOUNTER — Ambulatory Visit: Payer: Medicare Other | Admitting: Physical Therapy

## 2018-03-26 DIAGNOSIS — M25561 Pain in right knee: Secondary | ICD-10-CM

## 2018-03-26 DIAGNOSIS — M6281 Muscle weakness (generalized): Secondary | ICD-10-CM | POA: Diagnosis not present

## 2018-03-26 DIAGNOSIS — R262 Difficulty in walking, not elsewhere classified: Secondary | ICD-10-CM | POA: Diagnosis not present

## 2018-03-26 NOTE — Therapy (Signed)
[place], [place], [address] Phone: [phone]   Fax:  [phone]  Physical Therapy Treatment  Patient Details  Name: Eileen Cameron MRN: [id number] Date of Birth: [birth year] Referring Provider: Dr. Joni Reining   Encounter Date: 03/26/2018  PT End of Session - 03/26/18 0931    Visit Number  2    Number of Visits  12    Date for PT Re-Evaluation  04/23/18    PT Start Time  0846    PT Stop Time  0928    PT Time Calculation (min)  42 min    Activity Tolerance  Patient tolerated treatment well    Behavior During Therapy  [facility] for tasks assessed/performed       Past Medical History:  Diagnosis Date  . Adrenal incidentaloma ([place]) 07/08/2015   1.0 by 1.4 cm lateral limb left adrenal mass on CT abdomen on 07/08/15 Followed up in April 2018, stable no further imaging required.  . Allergy    seasonal allergies, ace inhibitors--angioedema  . Hyperlipidemia   . Hypertension     Past Surgical History:  Procedure Laterality Date  . COLONOSCOPY N/A 01/08/2013   Procedure: COLONOSCOPY;  Surgeon: Arta Silence, MD;  Location: [facility];  Service: Endoscopy;  Laterality: N/A;  . ECTOPIC PREGNANCY SURGERY  1970s  . right breast abscess drainage    . TONSILLECTOMY    . TUBAL LIGATION      There were no vitals filed for this visit.  Subjective Assessment - 03/26/18 0851    Subjective  Ive been doing my exercises.  Pain is intermittent.      Currently in Pain?  Yes    Pain Score  1  when I bend it     Pain Location  Knee    Pain Orientation  Right    Pain Descriptors / Indicators  Sore    Pain Type  Chronic pain    Pain Onset  More than a month ago    Pain Frequency  Intermittent    Aggravating Factors   bending, walking too much     Pain Relieving Factors  sit, rest, ice          [facility] Adult PT Treatment/Exercise - 03/26/18 0001      Knee/Hip Exercises: Stretches   Active Hamstring Stretch  Both;3  reps;30 seconds    Knee: Self-Stretch to increase Flexion  Right;3 reps      Knee/Hip Exercises: Aerobic   Stationary Bike  5 min L 1       Knee/Hip Exercises: Seated   Long Arc Quad  Strengthening;Right;1 set;20 reps;Weights    Long Arc Quad Weight  4 lbs.      Knee/Hip Exercises: Supine   Bridges  Strengthening;Both;1 set;10 reps mod cues to avoid back pain, better with PPT     Straight Leg Raises  Strengthening;Both;1 set;10 reps    Straight Leg Raise with External Rotation  Strengthening;Right;1 set;10 reps      Manual Therapy   Manual Therapy  Taping    Kinesiotex  Facilitate Muscle      Kinesiotix   Facilitate Muscle   quads Rt knee 2 "Y"s                   PT Long Term Goals - 03/26/18 0908      PT LONG TERM GOAL #1   Title  Patient will be I with HEP for core/hip/knee strength and flexibility  Status  On-going      PT LONG TERM GOAL #2   Title  Patient will stand for 30 min without increased pain in order to perfrom IADLs     Status  On-going      PT LONG TERM GOAL #3   Title  Pt will be able to walk 1 mile with min to no increase in knee pain.     Status  On-going      PT LONG TERM GOAL #4   Title  Pt will be able to bend her Rt knee to 120 deg without increased pain for proper transfer, sitting position     Status  On-going      PT LONG TERM GOAL #5   Title  Pt will increase hip strength to 4+/5 (abduction and extension) for maximal knee support.     Status  On-going            Plan - 03/26/18 0901    Clinical Impression Statement  Patient needed min cues for HEP.  Experienced some mild low back pain with bridging, offered modification , suggest she engage core properly prior to lifting. Trial of tape     PT Treatment/Interventions  ADLs/Self Care Home Management;Electrical Stimulation;Functional mobility training;Cryotherapy;Ultrasound;Moist Heat;Iontophoresis 4mg /ml Dexamethasone;Therapeutic activities;Therapeutic  exercise;Patient/family education;Taping;Manual techniques    PT Next Visit Plan  check HEP, Nustep, consider tape for quad activation     PT Home Exercise Plan  hamstring stretch, knee ROM, LAQ, bridge (with post pelvic tilt) and SLR        Patient will benefit from skilled therapeutic intervention in order to improve the following deficits and impairments:  Decreased balance, Decreased mobility, Difficulty walking, Hypomobility, Increased edema, Decreased range of motion, Decreased strength, Increased fascial restricitons, Pain, Impaired flexibility  Visit Diagnosis: Acute pain of right knee  Muscle weakness (generalized)  Difficulty in walking, not elsewhere classified     Problem List Patient Active Problem List   Diagnosis Date Noted  . Palpitations 02/18/2018  . Visual floaters, bilateral 02/18/2018  . Primary osteoarthritis of right knee 02/14/2018  . Neck pain, musculoskeletal 12/27/2017  . Vertigo 12/27/2017  . Obesity (BMI 30.0-34.9) 08/09/2017  . Vitamin D insufficiency 08/07/2017  . Back pain 04/05/2017  . Myalgia due to statin 02/08/2017  . Left shoulder pain 02/08/2017  . Post-nasal drip 10/10/2016  . Risk for coronary artery disease between 10% and 20% in next 10 years 08/11/2016  . History of statin therapy 08/10/2016  . Encounter for immunization 07/18/2016  . Right shoulder pain 04/20/2016  . Leg cramps 11/18/2015  . GERD (gastroesophageal reflux disease) 11/28/2014  . Itching 10/16/2014  . Insomnia 09/18/2014  . Seasonal allergies 12/31/2013  . Fatigue due to sleep pattern disturbance 05/13/2013  . Preventative health care 08/26/2012  . Essential hypertension 04/01/2009  . Lumbar back pain with radiculopathy affecting right lower extremity 04/01/2009    Letetia Romanello 03/26/2018, 9:32 AM  [place], [place], [address] Phone: [phone]   Fax:  [phone]  Name: Eileen Cameron MRN: [id number] Date of Birth: 1951/04/16  Raeford Razor, PT 03/26/18 9:32 AM Phone: [phone] Fax: [phone]

## 2018-03-28 ENCOUNTER — Ambulatory Visit: Payer: Medicare Other | Admitting: Physical Therapy

## 2018-03-28 ENCOUNTER — Encounter: Payer: Self-pay | Admitting: Physical Therapy

## 2018-03-28 DIAGNOSIS — R262 Difficulty in walking, not elsewhere classified: Secondary | ICD-10-CM | POA: Diagnosis not present

## 2018-03-28 DIAGNOSIS — M25561 Pain in right knee: Secondary | ICD-10-CM | POA: Diagnosis not present

## 2018-03-28 DIAGNOSIS — M6281 Muscle weakness (generalized): Secondary | ICD-10-CM

## 2018-03-28 NOTE — Therapy (Signed)
[place], [place], [address] Phone: [phone]   Fax:  [phone]  Physical Therapy Treatment  Patient Details  Name: Eileen Cameron MRN: [id number] Date of Birth: [birth year] Referring Provider: Dr. Joni Reining   Encounter Date: 03/28/2018  PT End of Session - 03/28/18 1142    Visit Number  3    Number of Visits  12    Date for PT Re-Evaluation  04/23/18    PT Start Time  1103    PT Stop Time  1142    PT Time Calculation (min)  39 min    Activity Tolerance  Patient tolerated treatment well    Behavior During Therapy  [facility] for tasks assessed/performed       Past Medical History:  Diagnosis Date  . Adrenal incidentaloma ([place]) 07/08/2015   1.0 by 1.4 cm lateral limb left adrenal mass on CT abdomen on 07/08/15 Followed up in April 2018, stable no further imaging required.  . Allergy    seasonal allergies, ace inhibitors--angioedema  . Hyperlipidemia   . Hypertension     Past Surgical History:  Procedure Laterality Date  . COLONOSCOPY N/A 01/08/2013   Procedure: COLONOSCOPY;  Surgeon: Arta Silence, MD;  Location: [facility];  Service: Endoscopy;  Laterality: N/A;  . ECTOPIC PREGNANCY SURGERY  1970s  . right breast abscess drainage    . TONSILLECTOMY    . TUBAL LIGATION      There were no vitals filed for this visit.  Subjective Assessment - 03/28/18 1105    Subjective  My knee is still hurting on and off, my exercises seem to be helping some. The tape is still holding on well.     Pertinent History  HTN, High cholesterol    Patient Stated Goals  Patient would like to have less pain.      Currently in Pain?  Yes only when I'm walking on it    Pain Score  7     Pain Location  Knee    Pain Orientation  Right    Pain Descriptors / Indicators  Discomfort    Pain Type  Chronic pain                       [facility] Adult PT Treatment/Exercise - 03/28/18 0001      Knee/Hip Exercises:  Standing   Heel Raises  Both;1 set;10 reps;Other (comment) heel and toe     Forward Lunges  Both;1 set;10 reps;Other (comment) 4 inch box     Forward Step Up  Both;1 set;10 reps    Other Standing Knee Exercises  hip hikes 1x10 B       Knee/Hip Exercises: Seated   Long Arc Quad  Both;20 reps;Other (comment) red TB     Hamstring Curl  Both;1 set;20 reps;Other (comment) red TB     Hamstring Limitations  red TB       Knee/Hip Exercises: Supine   Short Arc Quad Sets  Both;1 set;15 reps;Other (comment) 2# R, 3# L     Bridges  Both;1 set;15 reps with core set     Straight Leg Raises  Both;1 set;15 reps      Knee/Hip Exercises: Sidelying   Hip ABduction  Both;1 set;15 reps      Knee/Hip Exercises: Prone   Hip Extension  Both;1 set;15 reps             PT Education - 03/28/18 1142    Education  provided  Yes    Education Details  exercise form, recommendation for firm pillows for neck per patient request for advice     Person(s) Educated  Patient    Methods  Explanation    Comprehension  Verbalized understanding          PT Long Term Goals - 03/26/18 0908      PT LONG TERM GOAL #1   Title  Patient will be I with HEP for core/hip/knee strength and flexibility     Status  On-going      PT LONG TERM GOAL #2   Title  Patient will stand for 30 min without increased pain in order to perfrom IADLs     Status  On-going      PT LONG TERM GOAL #3   Title  Pt will be able to walk 1 mile with min to no increase in knee pain.     Status  On-going      PT LONG TERM GOAL #4   Title  Pt will be able to bend her Rt knee to 120 deg without increased pain for proper transfer, sitting position     Status  On-going      PT LONG TERM GOAL #5   Title  Pt will increase hip strength to 4+/5 (abduction and extension) for maximal knee support.     Status  On-going            Plan - 03/28/18 1143    Clinical Impression Statement  Patient arrives today doing well, very pleasant and  motivated to participate in PT moving forward. Focused today's session on functional strengthening for bilateral LEs as well as proximal musculature with good tolerance by patient and with modifications and cues for exercise for provided as appropriate. She continues to do well with PT moving forward.     Rehab Potential  Excellent    PT Frequency  2x / week    PT Duration  6 weeks    PT Treatment/Interventions  ADLs/Self Care Home Management;Electrical Stimulation;Functional mobility training;Cryotherapy;Ultrasound;Moist Heat;Iontophoresis 4mg /ml Dexamethasone;Therapeutic activities;Therapeutic exercise;Patient/family education;Taping;Manual techniques    PT Next Visit Plan  check HEP, Nustep, continue to progress strength. Tape if beneficial.     PT Home Exercise Plan  hamstring stretch, knee ROM, LAQ, bridge (with post pelvic tilt) and SLR     Consulted and Agree with Plan of Care  Patient       Patient will benefit from skilled therapeutic intervention in order to improve the following deficits and impairments:  Decreased balance, Decreased mobility, Difficulty walking, Hypomobility, Increased edema, Decreased range of motion, Decreased strength, Increased fascial restricitons, Pain, Impaired flexibility  Visit Diagnosis: Acute pain of right knee  Muscle weakness (generalized)  Difficulty in walking, not elsewhere classified     Problem List Patient Active Problem List   Diagnosis Date Noted  . Palpitations 02/18/2018  . Visual floaters, bilateral 02/18/2018  . Primary osteoarthritis of right knee 02/14/2018  . Neck pain, musculoskeletal 12/27/2017  . Vertigo 12/27/2017  . Obesity (BMI 30.0-34.9) 08/09/2017  . Vitamin D insufficiency 08/07/2017  . Back pain 04/05/2017  . Myalgia due to statin 02/08/2017  . Left shoulder pain 02/08/2017  . Post-nasal drip 10/10/2016  . Risk for coronary artery disease between 10% and 20% in next 10 years 08/11/2016  . History of statin  therapy 08/10/2016  . Encounter for immunization 07/18/2016  . Right shoulder pain 04/20/2016  . Leg cramps 11/18/2015  . GERD (gastroesophageal reflux  disease) 11/28/2014  . Itching 10/16/2014  . Insomnia 09/18/2014  . Seasonal allergies 12/31/2013  . Fatigue due to sleep pattern disturbance 05/13/2013  . Preventative health care 08/26/2012  . Essential hypertension 04/01/2009  . Lumbar back pain with radiculopathy affecting right lower extremity 04/01/2009    Deniece Ree PT, DPT, CBIS  Supplemental Physical Therapist [place]   P[facility] [place], [place], [address] Phone: [phone]   Fax:  [phone]  Name: Lenay Lovejoy MRN: [id number] Date of Birth: 1951-07-28

## 2018-03-29 ENCOUNTER — Ambulatory Visit: Payer: Medicare Other | Admitting: Physical Therapy

## 2018-04-03 ENCOUNTER — Ambulatory Visit: Payer: Medicare Other | Admitting: Physical Therapy

## 2018-04-05 ENCOUNTER — Ambulatory Visit: Payer: Medicare Other | Admitting: Physical Therapy

## 2018-04-05 ENCOUNTER — Encounter: Payer: Self-pay | Admitting: Physical Therapy

## 2018-04-05 DIAGNOSIS — M6281 Muscle weakness (generalized): Secondary | ICD-10-CM | POA: Diagnosis not present

## 2018-04-05 DIAGNOSIS — R262 Difficulty in walking, not elsewhere classified: Secondary | ICD-10-CM

## 2018-04-05 DIAGNOSIS — M25561 Pain in right knee: Secondary | ICD-10-CM | POA: Diagnosis not present

## 2018-04-05 NOTE — Therapy (Signed)
[place], [place], [address] Phone: [phone]   Fax:  [phone]  Physical Therapy Treatment  Patient Details  Name: Eileen Cameron MRN: [id number] Date of Birth: May 10, 1951 Referring Provider: Dr. Joni Reining   Encounter Date: 04/05/2018  PT End of Session - 04/05/18 0943    Visit Number  4    Number of Visits  12    Date for PT Re-Evaluation  04/23/18    PT Start Time  0933    PT Stop Time  1014    PT Time Calculation (min)  41 min    Activity Tolerance  Patient tolerated treatment well    Behavior During Therapy  [facility] for tasks assessed/performed       Past Medical History:  Diagnosis Date  . Adrenal incidentaloma ([place]) 07/08/2015   1.0 by 1.4 cm lateral limb left adrenal mass on CT abdomen on 07/08/15 Followed up in April 2018, stable no further imaging required.  . Allergy    seasonal allergies, ace inhibitors--angioedema  . Hyperlipidemia   . Hypertension     Past Surgical History:  Procedure Laterality Date  . COLONOSCOPY N/A 01/08/2013   Procedure: COLONOSCOPY;  Surgeon: Arta Silence, MD;  Location: [facility];  Service: Endoscopy;  Laterality: N/A;  . ECTOPIC PREGNANCY SURGERY  1970s  . right breast abscess drainage    . TONSILLECTOMY    . TUBAL LIGATION      There were no vitals filed for this visit.  Subjective Assessment - 04/05/18 0937    Subjective  I walked too long yesterday.  Had real bad pain lateral knee.  Severe. Got better with heat.      Currently in Pain?  Yes    Pain Score  2     Pain Location  Knee    Pain Orientation  Right    Pain Descriptors / Indicators  Aching    Pain Type  Chronic pain    Pain Onset  More than a month ago    Pain Frequency  Intermittent    Aggravating Factors   walking too much     Pain Relieving Factors  sit, rest, ice           [facility] Adult PT Treatment/Exercise - 04/05/18 0001      Knee/Hip Exercises: Stretches   Active  Hamstring Stretch  Both;3 reps;30 seconds    ITB Stretch  Both;3 reps;30 seconds      Knee/Hip Exercises: Aerobic   Stationary Bike  5 min L 1       Knee/Hip Exercises: Seated   Long Arc Quad  Strengthening;Both;1 set;20 reps    Marching  Strengthening;Both;1 set;15 reps    Marching Limitations  green band     Hamstring Curl  Strengthening;Both;1 set;20 reps    Hamstring Limitations  green       Knee/Hip Exercises: Supine   Bridges  Strengthening;Both;2 sets;10 reps    Other Supine Knee/Hip Exercises  clam green band x 20       Knee/Hip Exercises: Sidelying   Hip ABduction  Strengthening;Both;2 sets;10 reps             PT Education - 04/05/18 1011    Education provided  Yes    Education Details  HEP , walking vs biking for exercise (implications for arthritis)    Person(s) Educated  Patient    Methods  Explanation;Handout;Demonstration;Verbal cues    Comprehension  Verbalized understanding;Returned demonstration  PT Long Term Goals - 04/05/18 1016      PT LONG TERM GOAL #1   Title  Patient will be I with HEP for core/hip/knee strength and flexibility     Status  On-going      PT LONG TERM GOAL #2   Title  Patient will stand for 30 min without increased pain in order to perfrom IADLs     Status  On-going      PT LONG TERM GOAL #3   Title  Pt will be able to walk 1 mile with min to no increase in knee pain.     Status  On-going      PT LONG TERM GOAL #4   Title  Pt will be able to bend her Rt knee to 120 deg without increased pain for proper transfer, sitting position     Status  On-going      PT LONG TERM GOAL #5   Title  Pt will increase hip strength to 4+/5 (abduction and extension) for maximal knee support.     Status  On-going            Plan - 04/05/18 4098    Clinical Impression Statement  Doing well, was sore after last visit in R hip.  Weakness in hips noted today.  Fatigues quickly.  Progressing towards goals.     PT  Treatment/Interventions  ADLs/Self Care Home Management;Electrical Stimulation;Functional mobility training;Cryotherapy;Ultrasound;Moist Heat;Iontophoresis 4mg /ml Dexamethasone;Therapeutic activities;Therapeutic exercise;Patient/family education;Taping;Manual techniques    PT Next Visit Plan  check HEP, Nustep, continue to progress strength    PT Home Exercise Plan  hamstring stretch, knee ROM, LAQ, bridge (with post pelvic tilt) and SLR     Consulted and Agree with Plan of Care  Patient       Patient will benefit from skilled therapeutic intervention in order to improve the following deficits and impairments:  Decreased balance, Decreased mobility, Difficulty walking, Hypomobility, Increased edema, Decreased range of motion, Decreased strength, Increased fascial restricitons, Pain, Impaired flexibility  Visit Diagnosis: Acute pain of right knee  Muscle weakness (generalized)  Difficulty in walking, not elsewhere classified     Problem List Patient Active Problem List   Diagnosis Date Noted  . Palpitations 02/18/2018  . Visual floaters, bilateral 02/18/2018  . Primary osteoarthritis of right knee 02/14/2018  . Neck pain, musculoskeletal 12/27/2017  . Vertigo 12/27/2017  . Obesity (BMI 30.0-34.9) 08/09/2017  . Vitamin D insufficiency 08/07/2017  . Back pain 04/05/2017  . Myalgia due to statin 02/08/2017  . Left shoulder pain 02/08/2017  . Post-nasal drip 10/10/2016  . Risk for coronary artery disease between 10% and 20% in next 10 years 08/11/2016  . History of statin therapy 08/10/2016  . Encounter for immunization 07/18/2016  . Right shoulder pain 04/20/2016  . Leg cramps 11/18/2015  . GERD (gastroesophageal reflux disease) 11/28/2014  . Itching 10/16/2014  . Insomnia 09/18/2014  . Seasonal allergies 12/31/2013  . Fatigue due to sleep pattern disturbance 05/13/2013  . Preventative health care 08/26/2012  . Essential hypertension 04/01/2009  . Lumbar back pain with  radiculopathy affecting right lower extremity 04/01/2009    PAA,JENNIFER 04/05/2018, 11:53 AM  [place], [place], [address] Phone: [phone]   Fax:  [phone]  Name: Eileen Cameron MRN: [id number] Date of Birth: [birth year]  Raeford Razor, PT 04/05/18 11:53 AM Phone: [phone] Fax: [phone]

## 2018-04-08 ENCOUNTER — Ambulatory Visit: Payer: Medicare Other | Admitting: Cardiovascular Disease

## 2018-04-09 ENCOUNTER — Ambulatory Visit: Payer: Medicare Other | Attending: Internal Medicine | Admitting: Physical Therapy

## 2018-04-11 ENCOUNTER — Encounter: Payer: Medicare Other | Admitting: Physical Therapy

## 2018-04-16 ENCOUNTER — Ambulatory Visit: Payer: Medicare Other | Admitting: Physical Therapy

## 2018-04-18 ENCOUNTER — Ambulatory Visit: Payer: Medicare Other | Admitting: Physical Therapy

## 2018-05-08 ENCOUNTER — Ambulatory Visit (INDEPENDENT_AMBULATORY_CARE_PROVIDER_SITE_OTHER): Payer: Medicare Other | Admitting: Physician Assistant

## 2018-05-08 ENCOUNTER — Encounter: Payer: Self-pay | Admitting: Physician Assistant

## 2018-05-08 ENCOUNTER — Other Ambulatory Visit: Payer: Self-pay | Admitting: Physician Assistant

## 2018-05-08 ENCOUNTER — Encounter: Payer: Self-pay | Admitting: Physical Therapy

## 2018-05-08 VITALS — BP 134/84 | HR 77 | Ht 63.0 in | Wt 179.8 lb

## 2018-05-08 DIAGNOSIS — R0609 Other forms of dyspnea: Secondary | ICD-10-CM | POA: Diagnosis not present

## 2018-05-08 DIAGNOSIS — R079 Chest pain, unspecified: Secondary | ICD-10-CM | POA: Diagnosis not present

## 2018-05-08 DIAGNOSIS — R000 Tachycardia, unspecified: Secondary | ICD-10-CM | POA: Diagnosis not present

## 2018-05-08 DIAGNOSIS — R002 Palpitations: Secondary | ICD-10-CM | POA: Diagnosis not present

## 2018-05-08 DIAGNOSIS — R0600 Dyspnea, unspecified: Secondary | ICD-10-CM

## 2018-05-08 DIAGNOSIS — I209 Angina pectoris, unspecified: Secondary | ICD-10-CM

## 2018-05-08 DIAGNOSIS — R0789 Other chest pain: Secondary | ICD-10-CM

## 2018-05-08 LAB — BASIC METABOLIC PANEL
BUN / CREAT RATIO: 14 (ref 12–28)
BUN: 14 mg/dL (ref 8–27)
CO2: 23 mmol/L (ref 20–29)
CREATININE: 1.02 mg/dL — AB (ref 0.57–1.00)
Calcium: 9.9 mg/dL (ref 8.7–10.3)
Chloride: 98 mmol/L (ref 96–106)
GFR calc Af Amer: 65 mL/min/{1.73_m2} (ref >59)
GFR, EST NON AFRICAN AMERICAN: 56 mL/min/{1.73_m2} — AB (ref >59)
Glucose: 91 mg/dL (ref 65–99)
Potassium: 4.5 mmol/L (ref 3.5–5.2)
SODIUM: 139 mmol/L (ref 134–144)

## 2018-05-08 MED ORDER — METOPROLOL TARTRATE 50 MG PO TABS: ORAL_TABLET | ORAL | 0 refills | Status: DC

## 2018-05-08 MED ORDER — ASPIRIN EC 81 MG PO TBEC: 81.0000 mg | DELAYED_RELEASE_TABLET | Freq: Every day | ORAL | Status: DC

## 2018-05-08 NOTE — Patient Instructions (Addendum)
Medication Instructions:  Your physician has recommended you make the following change in your medication:  1-START Aspirin 81 mg by mouth daily.  Labwork: Your physician recommends that you have a BMET today.   Testing/Procedures: Your physician has requested that you have cardiac CT. Cardiac computed tomography (CT) is a painless test that uses an x-ray machine to take clear, detailed pictures of your heart. For further information please visit HugeFiesta[facility]tn. Please follow instruction sheet as given.  Follow-Up: Your physician recommends that you schedule a follow-up appointment in: 1 to 2 months with Dr. Caryl Comes after CT.  If you need a refill on your cardiac medications before your next appointment, please call your pharmacy.   Please arrive at the [place] main entrance of [facility] at xx:xx AM (30-45 minutes prior to test start time)  [facility] [place], [place] [address] [phone]  Proceed to the [facility] Radiology Department (First Floor).  Please follow these instructions carefully (unless otherwise directed):  On the Night Before the Test: . Drink plenty of water. . Do not consume any caffeinated/decaffeinated beverages or chocolate 12 hours prior to your test. . Do not take any antihistamines 12 hours prior to your test.  On the Day of the Test: . Drink plenty of water. Do not drink any water within one hour of the test. . Do not eat any food 4 hours prior to the test. . You may take your regular medications prior to the test. . IF NOT ON A BETA BLOCKER - Take 50 mg of lopressor (metoprolol) one hour before the test.  After the Test: . Drink plenty of water. . After receiving IV contrast, you may experience a mild flushed feeling. This is normal. . On occasion, you may experience a mild rash up to 24 hours after the test. This is not dangerous. If this occurs, you can take Benadryl 25 mg and increase your fluid  intake. . If you experience trouble breathing, this can be serious. If it is severe call 911 IMMEDIATELY. If it is mild, please call our office. . If you take any of these medications: Glipizide/Metformin, Avandament, Glucavance, please do not take 48 hours after completing test.

## 2018-05-08 NOTE — Progress Notes (Signed)
Cardiology Office Note    Date:  05/08/2018   ID:  Eileen Cameron, DOB 1951/07/14, MRN [id number]  PCP:  Lucious Groves, DO  Cardiologist: New to Caryl Comes   Chief Complaint: Palpitation   History of Present Illness:   Eileen Cameron is a 67 y.o. female hypertension, vertigo, GERD and hyperlipidemia referred by Dr. Heber Petersburg for evaluation of palpitation.  Her mother had coronary disease and underwent CABG.   Remotely seen by Dr. Ron Parker in 11/2010 for chest pain, felt atypical.  Echocardiogram September 2016 showed LV function of 55-60% and grade 1 diastolic dysfunction, mild dilated left atrium.  Low risk stress test 07/2015.  EKG 02/14/18 showed sinus rhythm at rate of 64 bpm - personally reviewed.  Hx of sleep disturbance. Non conclusive sleep study.   Hx of palpitations intermittently. Occurs once every few months. Last for less than 5 minutes. Only noticed during sleep. No associated symptoms. Last episode few weeks ago. TSH normal. She drinks one cup of coffee every day and occasional ice tea and chocolate. Has not noted any correlation of caffeine intake and palpitations. No regular exercise due to chronic knee pain.  Denies chest pain, shortness of breath, dizziness, orthopnea, PND or syncope. Mild ankle edema intermittently.  Take PRN nexium for GERD. Denies alcohol, tobacco smoking or illicit drug use.   Patient provided different hx to Dr. Caryl Comes. Atypical intermittent lower sternal chest pain. Also has intermittent palpitation occurring for few minutes once every few weeks. She has exertional dyspnea.   Past Medical History:  Diagnosis Date  . Adrenal incidentaloma ([place]) 07/08/2015   1.0 by 1.4 cm lateral limb left adrenal mass on CT abdomen on 07/08/15 Followed up in April 2018, stable no further imaging required.  . Allergy    seasonal allergies, ace inhibitors--angioedema  . Hyperlipidemia   . Hypertension     Past Surgical History:  Procedure Laterality Date  . COLONOSCOPY N/A  01/08/2013   Procedure: COLONOSCOPY;  Surgeon: Arta Silence, MD;  Location: [facility];  Service: Endoscopy;  Laterality: N/A;  . ECTOPIC PREGNANCY SURGERY  1970s  . right breast abscess drainage    . TONSILLECTOMY    . TUBAL LIGATION      Current Medications: Prior to Admission medications   Medication Sig Start Date End Date Taking? Authorizing Provider  Ascorbic Acid (VITAMIN C PO) Take 1 tablet by mouth daily.   Yes [provider]  Cholecalciferol (VITAMIN D3 PO) Take 1 tablet by mouth daily.   Yes [provider]  Cyanocobalamin (VITAMIN B-12 PO) Take 1 tablet by mouth daily.   Yes [provider]  doxylamine, Sleep, (UNISOM) 25 MG tablet Take 25 mg by mouth at bedtime.   Yes [provider]  esomeprazole (NEXIUM) 40 MG capsule Take 1 capsule (40 mg total) by mouth daily as needed. Patient taking differently: Take 40 mg by mouth daily as needed (acid reflux/heartburn).  08/09/17 08/09/18 Yes Lucious Groves, DO  fluticasone (FLONASE) 50 MCG/ACT nasal spray Place 1 spray into both nostrils daily as needed for allergies or rhinitis. 03/05/18  Yes Colbert Ewing, MD  olmesartan-hydrochlorothiazide (BENICAR HCT) 40-25 MG tablet Take 1 tablet by mouth daily. 02/12/18  Yes Lucious Groves, DO    Allergies:   Ace inhibitors   Social History   Socioeconomic History  . Marital status: Divorced    Spouse name: Not on file  . Number of children: 1  . Years of education: 82  . Highest education level:  Not on file  Occupational History  . Occupation: Retired   Scientific laboratory technician  . Financial resource strain: Not on file  . Food insecurity:    Worry: Not on file    Inability: Not on file  . Transportation needs:    Medical: Not on file    Non-medical: Not on file  Tobacco Use  . Smoking status: Current Every Day Smoker  . Smokeless tobacco: Never Used  Substance and Sexual Activity  . Alcohol use: No    Alcohol/week: 0.0 oz  . Drug use: No  . Sexual  activity: Not on file  Lifestyle  . Physical activity:    Days per week: Not on file    Minutes per session: Not on file  . Stress: Not on file  Relationships  . Social connections:    Talks on phone: Not on file    Gets together: Not on file    Attends religious service: Not on file    Active member of club or organization: Not on file    Attends meetings of clubs or organizations: Not on file    Relationship status: Not on file  Other Topics Concern  . Not on file  Social History Narrative   Lives alone   Caffeine use: none   Right handed      Family History:  The patient's family history includes Diabetes in her mother; Heart disease in her maternal grandmother; Heart disease (age of onset: 54) in her mother; Hypertension in her sister.   ROS:   Please see the history of present illness.    ROS All other systems reviewed and are negative.   PHYSICAL EXAM:   VS:  BP 134/84   Pulse 77   Ht 5\' 3"  (1.6 m)   Wt 179 lb 12.8 oz (81.6 kg)   SpO2 97%   BMI 31.85 kg/m    GEN: Well nourished, well developed, in no acute distress  HEENT: normal  Neck: no JVD, carotid bruits, or masses Cardiac: RRR; no murmurs, rubs, or gallops,no edema  Respiratory:  clear to auscultation bilaterally, normal work of breathing GI: soft, nontender, nondistended, + BS MS: no deformity or atrophy  Skin: warm and dry, no rash Neuro:  Alert and Oriented x 3, Strength and sensation are intact Psych: euthymic mood, full affect  Wt Readings from Last 3 Encounters:  05/08/18 179 lb 12.8 oz (81.6 kg)  03/05/18 182 lb (82.6 kg)  02/14/18 182 lb 14.4 oz (83 kg)     Studies/Labs Reviewed:   EKG:  EKG is not ordered today.    Recent Labs: 02/14/2018: ALT 7; BUN 13; Creatinine, Ser 0.89; Hemoglobin 13.4; Platelets 293; Potassium 4.1; Sodium 142; TSH 1.050   Lipid Panel    Component Value Date/Time   CHOL 189 08/10/2016 1124   TRIG 127 08/10/2016 1124   HDL 48 08/10/2016 1124   CHOLHDL 3.9  08/10/2016 1124   CHOLHDL 3.7 05/13/2013 1504   VLDL 20 05/13/2013 1504   LDLCALC 116 (H) 08/10/2016 1124    Additional studies/ records that were reviewed today include:   As above   ASSESSMENT & PLAN:    1. Palpitations - vague infrequent palpitations. No associated symptoms. Last for less than 5 minutes.  TSH normal. Prior echo and stress test in 2016 is reassuring. She will give [place] call if no improvement or worsening of symptoms>> get alive core. Complete cessation of caffienated products.   2. HTN - BP stable and well controlled  on  Benicar-HCT.   3. Chest pain  - Seems atypical. Could be related to GERD. Prior myoview low risk. Get coronary CTA. Start ASA 81mg  qd. Encouraged daily nexium instead PRN.   4. DOE - likely due to deconditioning,. No CHF symptoms. Encouraged daily exercise.     Medication Adjustments/Labs and Tests Ordered: Current medicines are reviewed at length with the patient today.  Concerns regarding medicines are outlined above.  Medication changes, Labs and Tests ordered today are listed in the Patient Instructions below. Patient Instructions  Medication Instructions:  Your physician has recommended you make the following change in your medication:  1-START Aspirin 81 mg by mouth daily.  Labwork: Your physician recommends that you have a BMET today.   Testing/Procedures: Your physician has requested that you have cardiac CT. Cardiac computed tomography (CT) is a painless test that uses an x-ray machine to take clear, detailed pictures of your heart. For further information please visit HugeFiesta[facility]tn. Please follow instruction sheet as given.  Follow-Up: Your physician recommends that you schedule a follow-up appointment in: 1 to 2 months with Dr. Caryl Comes after CT.  If you need a refill on your cardiac medications before your next appointment, please call your pharmacy.   Please arrive at the [facility] main entrance of [facility]  at xx:xx AM (30-45 minutes prior to test start time)  [facility] [place], [place] [address] [phone]  Proceed to the [facility] Radiology Department (First Floor).  Please follow these instructions carefully (unless otherwise directed):  On the Night Before the Test: . Drink plenty of water. . Do not consume any caffeinated/decaffeinated beverages or chocolate 12 hours prior to your test. . Do not take any antihistamines 12 hours prior to your test.  On the Day of the Test: . Drink plenty of water. Do not drink any water within one hour of the test. . Do not eat any food 4 hours prior to the test. . You may take your regular medications prior to the test. . IF NOT ON A BETA BLOCKER - Take 50 mg of lopressor (metoprolol) one hour before the test.  After the Test: . Drink plenty of water. . After receiving IV contrast, you may experience a mild flushed feeling. This is normal. . On occasion, you may experience a mild rash up to 24 hours after the test. This is not dangerous. If this occurs, you can take Benadryl 25 mg and increase your fluid intake. . If you experience trouble breathing, this can be serious. If it is severe call 911 IMMEDIATELY. If it is mild, please call our office. . If you take any of these medications: Glipizide/Metformin, Avandament, Glucavance, please do not take 48 hours after completing test.     Jarrett Soho, PA  05/08/2018 [place], [place], [place]  [address] Phone: [phone]; Fax: [phone]

## 2018-05-08 NOTE — Therapy (Signed)
[place], [place], [address] Phone: [phone]   Fax:  [phone]  Patient Details  Name: Aggie Douse MRN: [id number] Date of Birth: February 13, 1951 Referring Provider:  No ref. provider found  Encounter Date: 05/08/2018  PHYSICAL THERAPY DISCHARGE SUMMARY  Visits from Start of Care: 4  Current functional level related to goals / functional outcomes: Has not returned since last scheduled session- DC    Remaining deficits: Unable to assess    Education / Equipment: N/A  Plan: Patient agrees to discharge.  Patient goals were not met. Patient is being discharged due to not returning since the last visit.  ?????       Deniece Ree PT, DPT, CBIS  Supplemental Physical Therapist [place]   Pager [facility] [address] [place], [place], [address] Phone: [phone]   Fax:  [phone]

## 2018-05-09 ENCOUNTER — Other Ambulatory Visit: Payer: Self-pay | Admitting: Physician Assistant

## 2018-05-10 ENCOUNTER — Other Ambulatory Visit: Payer: Self-pay | Admitting: Physician Assistant

## 2018-05-10 NOTE — Telephone Encounter (Signed)
Pt requesting refill on metoprolol tartrate (LOPRESSOR) 50 MG tablet please address. Thank You

## 2018-05-11 ENCOUNTER — Other Ambulatory Visit: Payer: Self-pay | Admitting: Physician Assistant

## 2018-06-06 ENCOUNTER — Other Ambulatory Visit: Payer: Self-pay | Admitting: *Deleted

## 2018-06-06 DIAGNOSIS — Z79899 Other long term (current) drug therapy: Secondary | ICD-10-CM

## 2018-06-24 ENCOUNTER — Other Ambulatory Visit: Payer: Medicare Other | Admitting: *Deleted

## 2018-06-24 DIAGNOSIS — Z79899 Other long term (current) drug therapy: Secondary | ICD-10-CM

## 2018-06-24 LAB — BASIC METABOLIC PANEL
BUN/Creatinine Ratio: 13 (ref 12–28)
BUN: 13 mg/dL (ref 8–27)
CALCIUM: 9.7 mg/dL (ref 8.7–10.3)
CO2: 22 mmol/L (ref 20–29)
Chloride: 100 mmol/L (ref 96–106)
Creatinine, Ser: 0.99 mg/dL (ref 0.57–1.00)
GFR, EST AFRICAN AMERICAN: 67 mL/min/{1.73_m2} (ref >59)
GFR, EST NON AFRICAN AMERICAN: 58 mL/min/{1.73_m2} — AB (ref >59)
Glucose: 88 mg/dL (ref 65–99)
POTASSIUM: 4 mmol/L (ref 3.5–5.2)
Sodium: 140 mmol/L (ref 134–144)

## 2018-06-27 NOTE — Progress Notes (Signed)
Pt has been made aware of normal result and verbalized understanding.  jw 06/27/18

## 2018-07-02 ENCOUNTER — Other Ambulatory Visit (HOSPITAL_COMMUNITY): Payer: Self-pay | Admitting: Student

## 2018-07-02 ENCOUNTER — Ambulatory Visit (HOSPITAL_COMMUNITY): Payer: Medicare Other

## 2018-07-02 ENCOUNTER — Encounter (HOSPITAL_COMMUNITY): Payer: Self-pay | Admitting: Interventional Radiology

## 2018-07-02 ENCOUNTER — Ambulatory Visit (HOSPITAL_COMMUNITY)
Admission: RE | Admit: 2018-07-02 | Discharge: 2018-07-02 | Disposition: A | Discharge status: Home / Self Care | Payer: Medicare Other | Source: Ambulatory Visit | Attending: Physician Assistant | Admitting: Physician Assistant

## 2018-07-02 ENCOUNTER — Ambulatory Visit (HOSPITAL_COMMUNITY)
Admission: RE | Admit: 2018-07-02 | Discharge: 2018-07-02 | Disposition: A | Discharge status: Home / Self Care | Payer: Medicare Other | Source: Ambulatory Visit | Attending: Student | Admitting: Student

## 2018-07-02 DIAGNOSIS — R079 Chest pain, unspecified: Secondary | ICD-10-CM | POA: Insufficient documentation

## 2018-07-02 DIAGNOSIS — R0609 Other forms of dyspnea: Secondary | ICD-10-CM | POA: Diagnosis not present

## 2018-07-02 DIAGNOSIS — R000 Tachycardia, unspecified: Secondary | ICD-10-CM | POA: Diagnosis not present

## 2018-07-02 DIAGNOSIS — R002 Palpitations: Secondary | ICD-10-CM | POA: Diagnosis not present

## 2018-07-02 DIAGNOSIS — R0600 Dyspnea, unspecified: Secondary | ICD-10-CM | POA: Insufficient documentation

## 2018-07-02 DIAGNOSIS — I209 Angina pectoris, unspecified: Secondary | ICD-10-CM | POA: Diagnosis not present

## 2018-07-02 DIAGNOSIS — Z789 Other specified health status: Secondary | ICD-10-CM

## 2018-07-02 DIAGNOSIS — I878 Other specified disorders of veins: Secondary | ICD-10-CM | POA: Insufficient documentation

## 2018-07-02 DIAGNOSIS — R0789 Other chest pain: Secondary | ICD-10-CM | POA: Insufficient documentation

## 2018-07-02 HISTORY — PX: IR RADIOLOGY PERIPHERAL GUIDED IV START: IMG5598

## 2018-07-02 HISTORY — PX: IR US GUIDE VASC ACCESS RIGHT: IMG2390

## 2018-07-02 MED ORDER — LIDOCAINE HCL 1 % IJ SOLN
INTRAMUSCULAR | Status: AC
  Filled 2018-07-02: qty 20

## 2018-07-02 MED ORDER — NITROGLYCERIN 0.4 MG SL SUBL
0.8000 mg | SUBLINGUAL_TABLET | SUBLINGUAL | Status: DC | PRN
  Administered 2018-07-02: 0.8 mg via SUBLINGUAL
  Filled 2018-07-02 (×2): qty 25

## 2018-07-02 MED ORDER — METOPROLOL TARTRATE 5 MG/5ML IV SOLN
5.0000 mg | Freq: Once | INTRAVENOUS | Status: DC
Start: 2018-07-02 — End: 2018-07-03
  Filled 2018-07-02: qty 5

## 2018-07-02 MED ORDER — NITROGLYCERIN 0.4 MG SL SUBL
SUBLINGUAL_TABLET | SUBLINGUAL | Status: AC
  Filled 2018-07-02: qty 2

## 2018-07-02 MED ORDER — IOPAMIDOL (ISOVUE-370) INJECTION 76%
100.0000 mL | Freq: Once | INTRAVENOUS | Status: AC | PRN
  Administered 2018-07-02: 80 mL via INTRAVENOUS

## 2018-07-02 MED ORDER — LIDOCAINE HCL (PF) 1 % IJ SOLN
INTRAMUSCULAR | Status: DC | PRN
  Administered 2018-07-02: 5 mL

## 2018-07-02 MED ORDER — METOPROLOL TARTRATE 5 MG/5ML IV SOLN
INTRAVENOUS | Status: AC
  Filled 2018-07-02: qty 5

## 2018-07-02 NOTE — Progress Notes (Signed)
Pt has been made aware of normal result and verbalized understanding.  jw 07/02/18

## 2018-07-04 ENCOUNTER — Other Ambulatory Visit: Payer: Self-pay

## 2018-07-04 ENCOUNTER — Encounter: Payer: Self-pay | Admitting: Internal Medicine

## 2018-07-04 ENCOUNTER — Ambulatory Visit (INDEPENDENT_AMBULATORY_CARE_PROVIDER_SITE_OTHER): Payer: Medicare Other | Admitting: Internal Medicine

## 2018-07-04 VITALS — BP 158/70 | HR 72 | Temp 98.1°F | Ht 63.0 in | Wt 185.2 lb

## 2018-07-04 DIAGNOSIS — R6884 Jaw pain: Secondary | ICD-10-CM

## 2018-07-04 DIAGNOSIS — R0981 Nasal congestion: Secondary | ICD-10-CM

## 2018-07-04 NOTE — Patient Instructions (Addendum)
[name],   Please make sure to keep a diary of your symptoms if they return.  Please write how long the last, what makes them worse, and if there is any other associated symptoms with it.  If your symptoms get very frequent or worse please give [place] a call to let us know or make an appointment with [place] for follow-up.  In the meantime call us if you have any questions or concerns.  - Dr. Frederico Hamman

## 2018-07-04 NOTE — Progress Notes (Signed)
   CC: Bilateral jaw pain  HPI:  Eileen Cameron is a 67 y.o. female with PMH listed below who presents to clinic for evaluation of bilateral jaw pain.  Jaw pain: [name] presents with complaint of acute onset of bilateral jaw pain that started yesterday when she woke up.  Localizes it to the lower jaw at the angle of the mandible, temporal region not involved. She noticed worsening when chewing.  This resolved spontaneously after 4 hours.  She denies headache, dizziness, changes in vision, recent illness, and systemic symptoms.  HEENT exam reassuring.  Unclear etiology at this time.  Possibly pain secondary to teeth grinding during the night.  Low suspicion for TMJ given sudden resolution of symptoms without intervention. I have asked patient to keep a diary of symptoms and return to clinic if the pain returns and worsens.  Past Medical History:  Diagnosis Date  . Adrenal incidentaloma ([place]) 07/08/2015   1.0 by 1.4 cm lateral limb left adrenal mass on CT abdomen on 07/08/15 Followed up in April 2018, stable no further imaging required.  . Allergy    seasonal allergies, ace inhibitors--angioedema  . Hyperlipidemia   . Hypertension    Review of Systems:   Review of Systems  Constitutional: Negative for chills, fever, malaise/fatigue and weight loss.  HENT: Positive for congestion. Negative for ear pain, hearing loss, sore throat and tinnitus.   Eyes: Negative for blurred vision, double vision and pain.  Respiratory: Negative for cough and shortness of breath.   Neurological: Negative for dizziness and headaches.   Physical Exam: Vitals:   07/04/18 0957  BP: (!) 171/79  Pulse: 77  Temp: 98.1 F (36.7 C)  TempSrc: Oral  SpO2: 100%  Weight: 185 lb 3.2 oz (84 kg)  Height: 5\' 3"  (1.6 m)   General: Well-appearing elderly female in no acute distress Mouth: OP clear without exudates or erythema, no TMJ tenderness or gross abnormalities appreciated Ears: EAM unremarkable bilaterally, TM  pearly white without surrounding erythema or effusion Neck: Full range of motion, no lymphadenopathy  Assessment & Plan:   See Encounters Tab for problem based charting.  Patient discussed with Dr. Rebeca Alert

## 2018-07-04 NOTE — Assessment & Plan Note (Signed)
Jaw pain: Eileen Cameron presents with complaint of acute onset of bilateral jaw pain that started yesterday when she woke up.  Localizes it to the lower jaw at the angle of the mandible, temporal region not involved. She noticed worsening when chewing.  This resolved spontaneously after 4 hours.  She denies headache, dizziness, changes in vision, recent illness, and systemic symptoms.  HEENT exam reassuring.  Unclear etiology at this time.  Possibly pain secondary to teeth grinding during the night.  Low suspicion for TMJ given sudden resolution of symptoms without intervention. I have asked patient to keep a diary of symptoms and return to clinic if the pain returns and worsens.

## 2018-07-11 NOTE — Progress Notes (Signed)
Internal Medicine Clinic Attending  Case discussed with Dr. Santos-Sanchez at the time of the visit.  We reviewed the resident's history and exam and pertinent patient test results.  I agree with the assessment, diagnosis, and plan of care documented in the resident's note.  Alexander Raines, M.D., Ph.D.  

## 2018-08-19 ENCOUNTER — Telehealth: Payer: Self-pay | Admitting: Internal Medicine

## 2018-08-19 MED ORDER — HYDROCORTISONE 2.5 % EX LOTN: TOPICAL_LOTION | CUTANEOUS | 1 refills | Status: DC

## 2018-08-19 NOTE — Telephone Encounter (Signed)
I did not see cortisol cream in her records, I called she has had Rx strength hydrocortisone for "ezcema" rash before, rash has returned.   Will refill hydrocortisone 2.5%, if does not improve with this she will call for earlier appointment than her AWV in November.

## 2018-08-19 NOTE — Telephone Encounter (Signed)
Needs a refill on cortisol cream @ [facility]; pt contact [phone]

## 2018-09-03 ENCOUNTER — Telehealth: Payer: Self-pay | Admitting: Internal Medicine

## 2018-09-03 NOTE — Telephone Encounter (Signed)
Results [place]: moderate left foot, will results will be faxed in about 2-4 weeks.  [phone]

## 2018-09-04 NOTE — Telephone Encounter (Signed)
I have tried calling the ph# back and only get a vmail for a shana gordon, will see what else I can find

## 2018-09-04 NOTE — Telephone Encounter (Signed)
I am not sure I understand this message

## 2018-09-26 ENCOUNTER — Other Ambulatory Visit: Payer: Self-pay

## 2018-09-26 ENCOUNTER — Ambulatory Visit (INDEPENDENT_AMBULATORY_CARE_PROVIDER_SITE_OTHER): Payer: Medicare Other | Admitting: Internal Medicine

## 2018-09-26 ENCOUNTER — Encounter: Payer: Self-pay | Admitting: Internal Medicine

## 2018-09-26 VITALS — BP 146/73 | HR 77 | Temp 98.0°F | Ht 63.0 in | Wt 186.1 lb

## 2018-09-26 DIAGNOSIS — Z9229 Personal history of other drug therapy: Secondary | ICD-10-CM

## 2018-09-26 DIAGNOSIS — E66811 Obesity, class 1: Secondary | ICD-10-CM

## 2018-09-26 DIAGNOSIS — G4700 Insomnia, unspecified: Secondary | ICD-10-CM

## 2018-09-26 DIAGNOSIS — Z79899 Other long term (current) drug therapy: Secondary | ICD-10-CM

## 2018-09-26 DIAGNOSIS — K219 Gastro-esophageal reflux disease without esophagitis: Secondary | ICD-10-CM

## 2018-09-26 DIAGNOSIS — Z0000 Encounter for general adult medical examination without abnormal findings: Secondary | ICD-10-CM | POA: Diagnosis not present

## 2018-09-26 DIAGNOSIS — I10 Essential (primary) hypertension: Secondary | ICD-10-CM | POA: Diagnosis not present

## 2018-09-26 DIAGNOSIS — M545 Low back pain: Secondary | ICD-10-CM | POA: Diagnosis not present

## 2018-09-26 DIAGNOSIS — M791 Myalgia, unspecified site: Secondary | ICD-10-CM

## 2018-09-26 DIAGNOSIS — J302 Other seasonal allergic rhinitis: Secondary | ICD-10-CM

## 2018-09-26 DIAGNOSIS — M1711 Unilateral primary osteoarthritis, right knee: Secondary | ICD-10-CM

## 2018-09-26 DIAGNOSIS — T466X5A Adverse effect of antihyperlipidemic and antiarteriosclerotic drugs, initial encounter: Secondary | ICD-10-CM

## 2018-09-26 DIAGNOSIS — E669 Obesity, unspecified: Secondary | ICD-10-CM

## 2018-09-26 DIAGNOSIS — E559 Vitamin D deficiency, unspecified: Secondary | ICD-10-CM

## 2018-09-26 DIAGNOSIS — Z6832 Body mass index (BMI) 32.0-32.9, adult: Secondary | ICD-10-CM

## 2018-09-26 DIAGNOSIS — R5383 Other fatigue: Secondary | ICD-10-CM

## 2018-09-26 DIAGNOSIS — M5416 Radiculopathy, lumbar region: Secondary | ICD-10-CM

## 2018-09-26 DIAGNOSIS — G479 Sleep disorder, unspecified: Secondary | ICD-10-CM

## 2018-09-26 NOTE — Progress Notes (Addendum)
Subjective:   Eileen Cameron is a 67 y.o. female who presents for a Medicare Annual Wellness Visit.  Brings letter from [employer] of items to discuss. BP at home was 130/88 by nurse assessment. Brought flu shot documentation- [facility] on [address]  The following items have been reviewed and updated today in the appropriate area in the EMR.   Health Risk Assessment  Height, weight, BMI, and BP Visual acuity if needed Depression screen Fall risk / safety level Advance directive discussion Medical and family history were reviewed and updated Updating list of other providers & suppliers Medication reconciliation, including over the counter medicines Cognitive screen Written screening schedule Risk Factor list Personalized health advice, risky behaviors, and treatment advice  Dexa, Colon CA screen, Vaccinations all uptodate. Current Social History 09/26/2018    Patient lives alone in a/an apartment which is 1 story/stories. There are not steps up to the entrance the patient uses.   Patient's method of transportation is personal car.  The highest level of education was high school diploma.  The patient currently retired.  Identified important Relationships are God, daughter, 3 grandchildren   Pets : no   Interests / Fun: taking care of grandchildren, baking  Current Stressors: noting   Religious / Personal Beliefs: Historically methodist, now variable including TV  Other:       Objective:    Vitals: BP (!) 146/73   Pulse 77   Temp 98 F (36.7 C) (Oral)   Ht 5\' 3"  (1.6 m)   Wt 186 lb 1.6 oz (84.4 kg)   SpO2 100%   BMI 32.97 kg/m   Activities of Daily Living In your present state of health, do you have any difficulty performing the following activities: 09/26/2018 07/04/2018  Hearing? N N  Vision? N N  Difficulty concentrating or making decisions? N N  Walking or climbing stairs? N N  Comment - -  Dressing or bathing? N N  Doing errands,  shopping? N N  Some recent data might be hidden    Goals Goals    . Blood Pressure < 150/90       Fall Risk Fall Risk  09/26/2018 07/04/2018 02/14/2018 11/15/2017 08/09/2017  Falls in the past year? 0 No No No No  Number falls in past yr: - - - - -  Injury with Fall? - - - - -  Follow up - - - - -  Comment - - - - -    Depression Screen PHQ 2/9 Scores 09/26/2018 07/04/2018 02/14/2018 11/15/2017  PHQ - 2 Score 0 0 1 0  PHQ- 9 Score 1 2 - -     Cognitive Testing I assessed the patient for cognitive issues and the patient did not have issues with his / her cognition.    Assessment and Plan:    During the course of the visit the patient was educated and counseled about appropriate screening and preventive services as documented in the assessment and plan.  The printed AVS was given to the patient and included an updated screening schedule, a list of risk factors, and personalized health advice.    1. Essential hypertension Stable continue current medications  2. Gastroesophageal reflux disease without esophagitis Stable continue nexium PRN  3. Lumbar back pain with radiculopathy affecting right lower extremity Stable  4. Primary osteoarthritis of right knee Stable  5. Seasonal allergies Stable continue flonase  6. Fatigue due to sleep pattern disturbance Stable  7. Myalgia due to statin No issues  8. Obesity (BMI 30.0-34.9) Discussed importance of weight and diet  9. History of statin therapy No issues  10. Insomnia, unspecified type Sleep hygeine  11. Vitamin D insufficiency Continue 1000 IU D3 today     Lucious Groves, DO  09/26/2018

## 2018-09-26 NOTE — Patient Instructions (Signed)
Annual Wellness Visit   Medicare Covered Preventative Screenings and Services  Services & Screenings Men and Women Who How Often Need? Date of Last Service Action  Abdominal Aortic Aneurysm Adults with AAA risk factors Once     Alcohol Misuse and Counseling All Adults Screening once a year if no alcohol misuse. Counseling up to 4 face to face sessions.     Bone Density Measurement  Adults at risk for osteoporosis Once every 2 yrs     Lipid Panel Z13.6 All adults without CV disease Once every 5 yrs     Colorectal Cancer   Stool sample or  Colonoscopy All adults 35 and older   Once every year  Every 10 years     Depression All Adults Once a year  Today   Diabetes Screening Blood glucose, post glucose load, or GTT Z13.1  All adults at risk  Pre-diabetics  Once per year  Twice per year     Diabetes  Self-Management Training All adults Diabetics 10 hrs first year; 2 hours subsequent years. Requires Copay     Glaucoma  Diabetics  Family history of glaucoma  African Americans 69 yrs +  Hispanic Americans 54 yrs + Annually - requires coppay     Hepatitis C Z72.89 or F19.20  High Risk for HCV  Born between 1945 and 1965  Annually  Once     HIV Z11.4 All adults based on risk  Annually btw ages 85 & 87 regardless of risk  Annually > 65 yrs if at increased risk     Lung Cancer Screening Asymptomatic adults aged 54-77 with 30 pack yr history and current smoker OR quit within the last 15 yrs Annually Must have counseling and shared decision making documentation before first screen     Medical Nutrition Therapy Adults with   Diabetes  Renal disease  Kidney transplant within past 3 yrs 3 hours first year; 2 hours subsequent years     Obesity and Counseling All adults Screening once a year Counseling if BMI 30 or higher  Today   Tobacco Use Counseling Adults who use tobacco  Up to 8 visits in one year     Vaccines Z23  Hepatitis B  Influenza   Pneumonia  Adults    Once  Once every flu season  Two different vaccines separated by one year     Next Annual Wellness Visit People with Medicare Every year  Today     Services & Screenings Women Who How Often Need  Date of Last Service Action  Mammogram  Z12.31 Women over 15 One baseline ages 44-39. Annually ager 40 yrs+     Pap tests All women Annually if high risk. Every 2 yrs for normal risk women     Screening for cervical cancer with   Pap (Z01.419 nl or Z01.411abnl) &  HPV Z11.51 Women aged 44 to 54 Once every 5 yrs     Screening pelvic and breast exams All women Annually if high risk. Every 2 yrs for normal risk women     Sexually Transmitted Diseases  Chlamydia  Gonorrhea  Syphilis All at risk adults Annually for non pregnant females at increased risk         [place] Men Who How Ofter Need  Date of Last Service Action  Prostate Cancer - DRE & PSA Men over 50 Annually.  DRE might require a copay.     Sexually Transmitted Diseases  Syphilis All at risk adults Annually  for men at increased risk         Things That May Be Affecting Your Health:  Alcohol  Hearing loss  Pain    Depression  Home Safety  Sexual Health   Diabetes  Lack of physical activity  Stress   Difficulty with daily activities  Loneliness  Tiredness   Drug use  Medicines  Tobacco use   Falls  Motor Vehicle Safety  Weight   Food choices  Oral Health  Other    YOUR PERSONALIZED HEALTH PLAN : 1. Schedule your next subsequent Medicare Wellness visit in one year 2. Attend all of your regular appointments to address your medical issues 3. Complete the preventative screenings and services

## 2018-10-09 ENCOUNTER — Other Ambulatory Visit: Payer: Self-pay

## 2018-10-09 DIAGNOSIS — R0982 Postnasal drip: Secondary | ICD-10-CM

## 2018-10-09 NOTE — Telephone Encounter (Signed)
fluticasone (FLONASE) 50 MCG/ACT nasal spray   Refill request @  [facility] [phone] - [place], [address] AT [place] [phone] (Phone) [phone] (Fax)

## 2018-10-10 MED ORDER — FLUTICASONE PROPIONATE 50 MCG/ACT NA SUSP: 1.0000 | Freq: Every day | NASAL | 2 refills | Status: DC | PRN

## 2018-12-26 ENCOUNTER — Ambulatory Visit (INDEPENDENT_AMBULATORY_CARE_PROVIDER_SITE_OTHER): Payer: Medicare Other | Admitting: Internal Medicine

## 2018-12-26 ENCOUNTER — Other Ambulatory Visit: Payer: Self-pay

## 2018-12-26 ENCOUNTER — Encounter: Payer: Self-pay | Admitting: Internal Medicine

## 2018-12-26 VITALS — BP 146/80 | HR 69 | Temp 98.0°F | Ht 63.0 in | Wt 191.0 lb

## 2018-12-26 DIAGNOSIS — R252 Cramp and spasm: Secondary | ICD-10-CM | POA: Diagnosis not present

## 2018-12-26 DIAGNOSIS — G479 Sleep disorder, unspecified: Secondary | ICD-10-CM

## 2018-12-26 DIAGNOSIS — N183 Chronic kidney disease, stage 3 (moderate): Secondary | ICD-10-CM | POA: Diagnosis not present

## 2018-12-26 DIAGNOSIS — Z1231 Encounter for screening mammogram for malignant neoplasm of breast: Secondary | ICD-10-CM

## 2018-12-26 DIAGNOSIS — H547 Unspecified visual loss: Secondary | ICD-10-CM

## 2018-12-26 DIAGNOSIS — I10 Essential (primary) hypertension: Secondary | ICD-10-CM | POA: Diagnosis not present

## 2018-12-26 DIAGNOSIS — E559 Vitamin D deficiency, unspecified: Secondary | ICD-10-CM | POA: Diagnosis not present

## 2018-12-26 DIAGNOSIS — R6889 Other general symptoms and signs: Secondary | ICD-10-CM

## 2018-12-26 DIAGNOSIS — G4700 Insomnia, unspecified: Secondary | ICD-10-CM | POA: Diagnosis not present

## 2018-12-26 DIAGNOSIS — R5383 Other fatigue: Secondary | ICD-10-CM

## 2018-12-26 MED ORDER — DICLOFENAC SODIUM 1 % TD GEL: 4.0000 g | Freq: Four times a day (QID) | TRANSDERMAL | 3 refills | Status: DC

## 2018-12-26 NOTE — Assessment & Plan Note (Signed)
HPI: She reports intermittent leg cramping this is usually bilateral sometimes worse on the right leg.  She wonders if her potassium level could be low.  She also brings to me a form that her home health nurse completed back in October 2019 apparently she had ABIs at that time that showed a right ABI of 1.11 and a left ABI of 0.85.  Additionally in work-up of this problem before she has been noted to have a low vitamin D she has been intermittently taking over-the-counter supplementation for this.  She has no cramping when she walks.  Assessment leg cramps Plan: Check CMP magnesium vitamin D and ferritin I will repeat ABIs here in the office does not sound like vascular claudication symptoms though.

## 2018-12-26 NOTE — Assessment & Plan Note (Signed)
HPI: She reports she has been taking her olmesartan hydrochlorothiazide daily she has not yet taken it this morning.  She does feel a little congested today and wonders if this is the reason for her elevated blood pressure readings.  Blood pressure was much improved on recheck.  Assessment essential hypertension mildly above goal  Plan We will continue her olmesartan hydrochlorothiazide 40-25 mg daily.

## 2018-12-26 NOTE — Patient Instructions (Signed)
  Practice Good Sleep Hygiene Here are some suggestions Avoid napping during the day. It can disturb the normal pattern of sleep and wakefulness.  Avoid stimulants such as caffeine, nicotine, and alcohol too close to bedtime. While alcohol is well known to speed the onset of sleep, it disrupts sleep in the second half as the body begins to metabolize the alcohol, causing arousal.  Exercise can promote good sleep. Vigorous exercise should be taken in the morning or late afternoon. A relaxing exercise, like yoga, can be done before bed to help initiate a restful night's sleep. Food can be disruptive right before sleep. Stay away from large meals close to bedtime. Also dietary changes can cause sleep problems, if someone is struggling with a sleep problem, it's not a good time to start experimenting with spicy dishes. And, remember, chocolate has caffeine.  Ensure adequate exposure to natural light. This is particularly important for older people who may not venture outside as frequently as children and adults. Light exposure helps maintain a healthy sleep-wake cycle.  Establish a regular relaxing bedtime routine. Try to avoid emotionally upsetting conversations and activities before trying to go to sleep. Don't dwell on, or bring your problems to bed.  Associate your bed with sleep. It's not a good idea to use your bed to watch TV, listen to the radio, or read.  Make sure that the sleep environment is pleasant and relaxing. The bed should be comfortable, the room should not be too hot or cold, or too bright.

## 2018-12-26 NOTE — Assessment & Plan Note (Signed)
HPI: Still having some difficulty staying asleep she reports that sometimes she wakes up at 2-4 AM has difficulty falling back to sleep.  She does keep the TV on in her room and listens to Eileen Cameron  Unisom helps some but she feels it makes her little groggy.  Thinks about changing back to melatonin.  Assessment insomnia and sleep related fatigue  Plan Discussed melatonin would be fine to use.  Also discussed to naturally increase melatonin advised to limit artificial light by TV also went over other sleep hygiene practices.

## 2018-12-26 NOTE — Assessment & Plan Note (Signed)
Recheck vitamin D

## 2018-12-26 NOTE — Progress Notes (Signed)
Subjective:  HPI: [name] is a 68 y.o. female who presents for follow-up of hypertension, insomnia, leg cramping.  Please see Assessment and Plan below for the status of her chronic medical problems.  Review of Systems: Review of Systems  Constitutional: Positive for malaise/fatigue. Negative for chills, fever and weight loss.  Cardiovascular: Negative for chest pain.  Genitourinary: Negative for dysuria.  Neurological: Negative for dizziness.  Psychiatric/Behavioral: The patient has insomnia.     Objective:  Physical Exam: Vitals:   12/26/18 0904 12/26/18 1212  BP: (!) 174/73 (!) 146/80  Pulse: 91 69  Temp: 98 F (36.7 C)   TempSrc: Oral   SpO2: 100%   Weight: 191 lb (86.6 kg)   Height: 5\' 3"  (1.6 m)    Body mass index is 33.83 kg/m. Physical Exam Vitals signs and nursing note reviewed.  HENT:     Right Ear: Tympanic membrane, ear canal and external ear normal. There is no impacted cerumen.     Left Ear: Tympanic membrane, ear canal and external ear normal. There is no impacted cerumen.     Nose: No congestion.  Cardiovascular:     Rate and Rhythm: Normal rate and regular rhythm.  Abdominal:     General: Abdomen is flat.     Palpations: Abdomen is soft.  Musculoskeletal:     Right lower leg: No edema.     Left lower leg: No edema.     Comments: Mild tenderness of left lateral aspect of tibia (insertion of IT band)  Skin:    General: Skin is warm.    Assessment & Plan:  Leg cramps HPI: She reports intermittent leg cramping this is usually bilateral sometimes worse on the right leg.  She wonders if her potassium level could be low.  She also brings to me a form that her home health nurse completed back in October 2019 apparently she had ABIs at that time that showed a right ABI of 1.11 and a left ABI of 0.85.  Additionally in work-up of this problem before she has been noted to have a low vitamin D she has been intermittently taking over-the-counter  supplementation for this.  She has no cramping when she walks.  Assessment leg cramps Plan: Check CMP magnesium vitamin D and ferritin I will repeat ABIs here in the office does not sound like vascular claudication symptoms though.  Vitamin D insufficiency Recheck vitamin D  Essential hypertension HPI: She reports she has been taking her olmesartan hydrochlorothiazide daily she has not yet taken it this morning.  She does feel a little congested today and wonders if this is the reason for her elevated blood pressure readings.  Blood pressure was much improved on recheck.  Assessment essential hypertension mildly above goal  Plan We will continue her olmesartan hydrochlorothiazide 40-25 mg daily.   Insomnia HPI: Still having some difficulty staying asleep she reports that sometimes she wakes up at 2-4 AM has difficulty falling back to sleep.  She does keep the TV on in her room and listens to [name].Marland Kitchen  Unisom helps some but she feels it makes her little groggy.  Thinks about changing back to melatonin.  Assessment insomnia and sleep related fatigue  Plan Discussed melatonin would be fine to use.  Also discussed to naturally increase melatonin advised to limit artificial light by TV also went over other sleep hygiene practices.   Medications Ordered Meds ordered this encounter  Medications  . diclofenac sodium (VOLTAREN) 1 % GEL  Sig: Apply 4 g topically 4 (four) times daily.    Dispense:  100 g    Refill:  3   Other Orders Orders Placed This Encounter  Procedures  . MM Digital Screening    Standing Status:   Future    Standing Expiration Date:   02/24/2020    Order Specific Question:   Reason for Exam (SYMPTOM  OR DIAGNOSIS REQUIRED)    Answer:   breast cancer screening    Order Specific Question:   Preferred imaging location?    Answer:   [facility]  . CMP14 + Anion Gap  . Ferritin  . Vitamin D (25 hydroxy)  . Magnesium  . Ambulatory referral to  Ophthalmology    Referral Priority:   Routine    Referral Type:   Consultation    Referral Reason:   Patient Preference    Requested Specialty:   Ophthalmology    Number of Visits Requested:   1  . POCT ABI Screening Pilot No Charge    This Order is for screening for the ABI Pilot.  It is a no charge exam.    Follow Up: Return in about 3 months (around 03/26/2019).

## 2018-12-27 LAB — CMP14 + ANION GAP
A/G RATIO: 1.7 (ref 1.2–2.2)
ALK PHOS: 107 IU/L (ref 39–117)
ALT: 9 IU/L (ref 0–32)
AST: 20 IU/L (ref 0–40)
Albumin: 4.8 g/dL (ref 3.8–4.8)
Anion Gap: 17 mmol/L (ref 10.0–18.0)
BILIRUBIN TOTAL: 0.3 mg/dL (ref 0.0–1.2)
BUN/Creatinine Ratio: 20 (ref 12–28)
BUN: 19 mg/dL (ref 8–27)
CHLORIDE: 101 mmol/L (ref 96–106)
CO2: 22 mmol/L (ref 20–29)
Calcium: 9.7 mg/dL (ref 8.7–10.3)
Creatinine, Ser: 0.93 mg/dL (ref 0.57–1.00)
GFR calc Af Amer: 73 mL/min/{1.73_m2} (ref >59)
GFR, EST NON AFRICAN AMERICAN: 63 mL/min/{1.73_m2} (ref >59)
GLUCOSE: 92 mg/dL (ref 65–99)
Globulin, Total: 2.8 g/dL (ref 1.5–4.5)
POTASSIUM: 4.7 mmol/L (ref 3.5–5.2)
SODIUM: 140 mmol/L (ref 134–144)
Total Protein: 7.6 g/dL (ref 6.0–8.5)

## 2018-12-27 LAB — MAGNESIUM: Magnesium: 2.5 mg/dL — ABNORMAL HIGH (ref 1.6–2.3)

## 2018-12-27 LAB — FERRITIN: Ferritin: 116 ng/mL (ref 15–150)

## 2018-12-27 LAB — VITAMIN D 25 HYDROXY (VIT D DEFICIENCY, FRACTURES): VIT D 25 HYDROXY: 23.4 ng/mL — AB (ref 30.0–100.0)

## 2019-01-17 ENCOUNTER — Other Ambulatory Visit: Payer: Self-pay | Admitting: Internal Medicine

## 2019-01-17 DIAGNOSIS — I10 Essential (primary) hypertension: Secondary | ICD-10-CM

## 2019-01-17 MED ORDER — OLMESARTAN MEDOXOMIL-HCTZ 40-25 MG PO TABS: 1.0000 | ORAL_TABLET | Freq: Every day | ORAL | 3 refills | Status: DC

## 2019-01-17 NOTE — Telephone Encounter (Signed)
Needs refill on   olmesartan-hydrochlorothiazide [facility] HCT) 40-25 MG tablet  [facility] [phone] - [place], [place]   ;pt contact [phone]

## 2019-01-17 NOTE — Telephone Encounter (Signed)
Next appt scheduled 5/21 with PCP. 

## 2019-03-10 ENCOUNTER — Other Ambulatory Visit: Payer: Self-pay

## 2019-03-10 NOTE — Telephone Encounter (Signed)
Returned call to patient. Requesting refill on meclizine for her vertigo. No longer on current med list.  Also, states when she picks up something heavy, like a watermelon, it bothers her neck and wonders if neck brace will help. Will forward to PCP. Hubbard Hartshorn, RN, BSN

## 2019-03-10 NOTE — Telephone Encounter (Signed)
Requesting to speak with a nurse about med for vertigo and neck brace. Please call pt back.

## 2019-03-11 MED ORDER — MECLIZINE HCL 25 MG PO TABS: 25.0000 mg | ORAL_TABLET | Freq: Three times a day (TID) | ORAL | 1 refills | Status: DC | PRN

## 2019-03-11 NOTE — Telephone Encounter (Signed)
Provided refill of meclazine, visit on the 21st would be fine

## 2019-03-27 ENCOUNTER — Other Ambulatory Visit: Payer: Self-pay

## 2019-03-27 ENCOUNTER — Ambulatory Visit (INDEPENDENT_AMBULATORY_CARE_PROVIDER_SITE_OTHER): Payer: Medicare Other | Admitting: Internal Medicine

## 2019-03-27 DIAGNOSIS — J0100 Acute maxillary sinusitis, unspecified: Secondary | ICD-10-CM

## 2019-03-27 DIAGNOSIS — I10 Essential (primary) hypertension: Secondary | ICD-10-CM

## 2019-03-27 DIAGNOSIS — J329 Chronic sinusitis, unspecified: Secondary | ICD-10-CM | POA: Insufficient documentation

## 2019-03-27 DIAGNOSIS — M542 Cervicalgia: Secondary | ICD-10-CM | POA: Diagnosis not present

## 2019-03-27 MED ORDER — AMOXICILLIN-POT CLAVULANATE 875-125 MG PO TABS: 1.0000 | ORAL_TABLET | Freq: Two times a day (BID) | ORAL | 0 refills | Status: DC

## 2019-03-27 NOTE — Assessment & Plan Note (Signed)
She reports intermittent neck and back pain especially after lifting heavy objects.  She is wondering about a neck brace.  Assessment musculoskeletal neck pain  Plan I discussed with her that I do not think a neck brace would be helpful at this time.  I encouraged her to utilize stretching, massage and heat to the area.

## 2019-03-27 NOTE — Assessment & Plan Note (Signed)
HPI: She reports she has been taking her olmesartan hydrochlorothiazide daily.  Has not been checking BP but feels overall well. No HA, changes in vision  Assessment essential hypertension   Plan continue olmesartan hydrochlorothiazide 40-25 mg daily.

## 2019-03-27 NOTE — Progress Notes (Signed)
  [facility] Internal Medicine Residency Telephone Encounter Continuity Care Appointment  HPI:   This telephone encounter was created for [name] on 03/27/2019 for the following purpose/cc f/u HTN.   Past Medical History:  Past Medical History:  Diagnosis Date  . Adrenal incidentaloma ([place]) 07/08/2015   1.0 by 1.4 cm lateral limb left adrenal mass on CT abdomen on 07/08/15 Followed up in April 2018, stable no further imaging required.  . Allergy    seasonal allergies, ace inhibitors--angioedema  . Hyperlipidemia   . Hypertension       ROS:  Review of Systems  Constitutional: Negative for fever.  HENT: Positive for congestion and sinus pain. Negative for hearing loss and sore throat.   Eyes: Negative for blurred vision.  Respiratory: Negative for cough, sputum production and shortness of breath.   Cardiovascular: Negative for chest pain and leg swelling.  Gastrointestinal: Negative for abdominal pain.  Genitourinary: Negative for dysuria.  Musculoskeletal: Positive for neck pain.  Neurological: Negative for dizziness.  Endo/Heme/Allergies: Negative for polydipsia.  Psychiatric/Behavioral: The patient has insomnia.        Assessment / Plan / Recommendations:   Please see A&P under problem oriented charting for assessment of the patient's acute and chronic medical conditions.   As always, pt is advised that if symptoms worsen or new symptoms arise, they should go to an urgent care facility or to to ER for further evaluation.   Consent and Medical Decision Making:     This is a telephone encounter between Eileen Cameron and Lucious Groves on 03/27/2019 for HTN. The visit was conducted with the patient located at home and Lucious Groves at [facility]. The patient's identity was confirmed using their DOB and current address. The patient has consented to being evaluated through a telephone encounter and understands the associated risks (an examination cannot be done and the patient  may need to come in for an appointment) / benefits (allows the patient to remain at home, decreasing exposure to coronavirus). I personally spent 10 minutes on medical discussion.

## 2019-03-27 NOTE — Assessment & Plan Note (Signed)
HPI: She reports she has had nasal congestion and maxillary sinus pain for about 1.5 weeks.  She has been taking over-the-counter antihistamines as well as steroid nasal spray but has not had relief.  She has not had any fever cough.  She does have some yellow-green nasal discharge and blowing her nose.  She thinks this is a bacterial sinus infection and is requesting an antibiotic.  Assessment acute sinus infection  Plan Given time course of 1.5 weeks reasonable to prescribe an antibiotic at this point.  We will give her 1 week course of Augmentin.  She will call back if this does not help.

## 2019-04-14 ENCOUNTER — Telehealth: Payer: Self-pay | Admitting: Internal Medicine

## 2019-04-14 NOTE — Telephone Encounter (Signed)
Pt would a like to nurse to call, she having a lot of sinus pressure; [phone]

## 2019-04-14 NOTE — Telephone Encounter (Signed)
Return call to pt, had telehealth visit w/ pcp for sinus pain and congestion with neck pain on 03/27/19.  Pt still c/o maxillary and frontal sinus pressure with congestion (no drainage) and a "whooshing sound" in right ear.  Also still c/o right, posterior head and neck pain which is intermittent.  Pt states she has taken all ABX as directed and has used heat, massage and ibuprofen with some relief. Pt also states this has been "going on for years". Will forward to PCP for further recommendations.  NURSING TRIAGE NOTE FOR RESPIRATORY SYMPTOMS  Do you have a fever? No  Do you have a cough? NO Do you have shortness of breath more than normal? No Do you have chest pain?No Are you able to eat and drink normally? Yes Have you seen a physician for these symptoms? Telehealth visit with PCP on 03/27/19  Thank you, SChaplin, RN,BSN

## 2019-04-17 NOTE — Telephone Encounter (Signed)
Can offer a telehealth visit tomorrow with Dr. Annie Paras

## 2019-04-18 NOTE — Telephone Encounter (Signed)
Left message on VM requesting return call to schedule telehealth appt today. Hubbard Hartshorn, RN, BSN

## 2019-04-23 ENCOUNTER — Other Ambulatory Visit: Payer: Self-pay

## 2019-04-23 ENCOUNTER — Ambulatory Visit (INDEPENDENT_AMBULATORY_CARE_PROVIDER_SITE_OTHER): Payer: Medicare Other | Admitting: Internal Medicine

## 2019-04-23 ENCOUNTER — Encounter: Payer: Self-pay | Admitting: Internal Medicine

## 2019-04-23 DIAGNOSIS — J321 Chronic frontal sinusitis: Secondary | ICD-10-CM

## 2019-04-23 DIAGNOSIS — M542 Cervicalgia: Secondary | ICD-10-CM | POA: Diagnosis not present

## 2019-04-23 DIAGNOSIS — J3489 Other specified disorders of nose and nasal sinuses: Secondary | ICD-10-CM | POA: Insufficient documentation

## 2019-04-23 NOTE — Assessment & Plan Note (Addendum)
HPI: Eileen Cameron was treated with 7 days of abx for acute sinusitis last month. She reports her symptoms didn't improve with the antibiotics and afterwards she developed pain in her forehead between her eyes. No aggravating factors. The pain improves with nasal rinses, steam, and hot showers. Ibuprofen and tylenol do not improve the pain. She denies rhinorrhea, fever, dyspnea, cough, n/v, diarrhea, or body aches. She takes claritin and flonase daily. She endorses low energy and appetite due to the pain in her forehead.   Assessment: Given that the augmentin did not improve her symptoms at all, doubt that this is persistent or recurrent sinusitis. She is on maximal therapy for chronic rhinosinusitis, but continues to have discomfort. Will refer to ENT.  Plan - ENT referral placed

## 2019-04-23 NOTE — Progress Notes (Signed)
   This is a telephone encounter between Eileen Cameron and Eileen Cameron on 04/23/2019 for sinus pain. The visit was conducted with the patient located at home and Corinne Ports at Home. The patient's identity was confirmed using their DOB and current address. The patient has consented to being evaluated through a telephone encounter and understands the associated risks/benefits. I personally spent 13 minutes on medical discussion.   HPI:   [name] is a 68 y.o. female with the medical conditions listed below who called the internal medicine clinic for ongoing sinus pain and right-sided neck pain. Please see problem based charting for the history and status of the patient's current and chronic medical conditions.   Past Medical History:  Diagnosis Date  . 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  . Allergy    seasonal allergies, ace inhibitors--angioedema  . Hyperlipidemia   . Hypertension     Review of Systems:   Pertinent positives mentioned in HPI. Remainder of all ROS negative.   Assessment & Plan:   Patient discussed with Dr. Angelia Mould

## 2019-04-23 NOTE — Assessment & Plan Note (Signed)
Patient continues to have pain in her right upper neck and the right side of her head. She states the pain improves with heating pads, but is not relieved by tylenol. Sometimes the pain is exacerbated by chewing. She denies muscle pain or weakness.   Assessment: Pain sees musculoskeletal in nature. No suspicion of temporal arteritis at this time. Advised patient to continue supportive therapy with heat and to add stretching and NSAIDs.   Plan - Heat, stretching, NSAIDs prn

## 2019-04-28 NOTE — Progress Notes (Signed)
Internal Medicine Clinic Attending  Case discussed with Dr. Dorrell at the time of the visit.  We reviewed the resident's history and exam and pertinent patient test results.  I agree with the assessment, diagnosis, and plan of care documented in the resident's note.    

## 2019-05-01 DIAGNOSIS — G500 Trigeminal neuralgia: Secondary | ICD-10-CM | POA: Diagnosis not present

## 2019-05-01 DIAGNOSIS — M26623 Arthralgia of bilateral temporomandibular joint: Secondary | ICD-10-CM | POA: Diagnosis not present

## 2019-05-05 DIAGNOSIS — R51 Headache: Secondary | ICD-10-CM | POA: Diagnosis not present

## 2019-05-05 DIAGNOSIS — G4733 Obstructive sleep apnea (adult) (pediatric): Secondary | ICD-10-CM | POA: Diagnosis not present

## 2019-05-05 DIAGNOSIS — M316 Other giant cell arteritis: Secondary | ICD-10-CM | POA: Diagnosis not present

## 2019-05-05 DIAGNOSIS — M26623 Arthralgia of bilateral temporomandibular joint: Secondary | ICD-10-CM | POA: Diagnosis not present

## 2019-05-05 DIAGNOSIS — G500 Trigeminal neuralgia: Secondary | ICD-10-CM | POA: Diagnosis not present

## 2019-05-26 DIAGNOSIS — H1045 Other chronic allergic conjunctivitis: Secondary | ICD-10-CM | POA: Diagnosis not present

## 2019-05-26 DIAGNOSIS — H25813 Combined forms of age-related cataract, bilateral: Secondary | ICD-10-CM | POA: Diagnosis not present

## 2019-06-10 DIAGNOSIS — E785 Hyperlipidemia, unspecified: Secondary | ICD-10-CM | POA: Diagnosis not present

## 2019-06-10 DIAGNOSIS — H547 Unspecified visual loss: Secondary | ICD-10-CM | POA: Diagnosis not present

## 2019-06-10 DIAGNOSIS — Z0000 Encounter for general adult medical examination without abnormal findings: Secondary | ICD-10-CM | POA: Diagnosis not present

## 2019-06-10 DIAGNOSIS — J309 Allergic rhinitis, unspecified: Secondary | ICD-10-CM | POA: Diagnosis not present

## 2019-06-10 DIAGNOSIS — I10 Essential (primary) hypertension: Secondary | ICD-10-CM | POA: Diagnosis not present

## 2019-07-18 ENCOUNTER — Emergency Department (HOSPITAL_COMMUNITY)
Admission: EM | Admit: 2019-07-18 | Discharge: 2019-07-18 | Disposition: A | Discharge status: Home / Self Care | Payer: Medicare Other | Attending: Emergency Medicine | Admitting: Emergency Medicine

## 2019-07-18 DIAGNOSIS — R51 Headache: Secondary | ICD-10-CM | POA: Diagnosis not present

## 2019-07-18 DIAGNOSIS — Z5321 Procedure and treatment not carried out due to patient leaving prior to being seen by health care provider: Secondary | ICD-10-CM | POA: Diagnosis not present

## 2019-07-18 NOTE — ED Triage Notes (Signed)
Per EMS: Pt c/o headache for 5 hours.  Negative stroke screen.  Hx of htn.  Takes benicar for HTN.  Supposed to have an appt tomorrow to adjust meds.

## 2019-07-18 NOTE — ED Triage Notes (Signed)
Pt has appt at 8 am and would like to leave and just go to her doctors appt in the morning.  Allowed me to take one last set of vital signs before she leaves.

## 2019-08-10 ENCOUNTER — Other Ambulatory Visit: Payer: Self-pay

## 2019-08-10 ENCOUNTER — Observation Stay (HOSPITAL_COMMUNITY): Payer: Medicare Other

## 2019-08-10 ENCOUNTER — Encounter (HOSPITAL_COMMUNITY): Payer: Self-pay | Admitting: Emergency Medicine

## 2019-08-10 ENCOUNTER — Observation Stay (HOSPITAL_COMMUNITY)
Admission: EM | Admit: 2019-08-10 | Discharge: 2019-08-11 | Disposition: A | Discharge status: Home / Self Care | Payer: Medicare Other | Attending: Internal Medicine | Admitting: Internal Medicine

## 2019-08-10 DIAGNOSIS — M479 Spondylosis, unspecified: Secondary | ICD-10-CM | POA: Diagnosis not present

## 2019-08-10 DIAGNOSIS — Z8249 Family history of ischemic heart disease and other diseases of the circulatory system: Secondary | ICD-10-CM | POA: Diagnosis not present

## 2019-08-10 DIAGNOSIS — N179 Acute kidney failure, unspecified: Secondary | ICD-10-CM | POA: Diagnosis not present

## 2019-08-10 DIAGNOSIS — Z20828 Contact with and (suspected) exposure to other viral communicable diseases: Secondary | ICD-10-CM | POA: Diagnosis not present

## 2019-08-10 DIAGNOSIS — Z888 Allergy status to other drugs, medicaments and biological substances status: Secondary | ICD-10-CM | POA: Insufficient documentation

## 2019-08-10 DIAGNOSIS — R21 Rash and other nonspecific skin eruption: Secondary | ICD-10-CM | POA: Diagnosis not present

## 2019-08-10 DIAGNOSIS — Z791 Long term (current) use of non-steroidal anti-inflammatories (NSAID): Secondary | ICD-10-CM | POA: Diagnosis not present

## 2019-08-10 DIAGNOSIS — R42 Dizziness and giddiness: Secondary | ICD-10-CM | POA: Diagnosis present

## 2019-08-10 DIAGNOSIS — Z79899 Other long term (current) drug therapy: Secondary | ICD-10-CM | POA: Insufficient documentation

## 2019-08-10 DIAGNOSIS — D72819 Decreased white blood cell count, unspecified: Secondary | ICD-10-CM | POA: Insufficient documentation

## 2019-08-10 DIAGNOSIS — M1711 Unilateral primary osteoarthritis, right knee: Secondary | ICD-10-CM | POA: Diagnosis not present

## 2019-08-10 DIAGNOSIS — I10 Essential (primary) hypertension: Secondary | ICD-10-CM | POA: Insufficient documentation

## 2019-08-10 DIAGNOSIS — E785 Hyperlipidemia, unspecified: Secondary | ICD-10-CM | POA: Diagnosis not present

## 2019-08-10 LAB — COMPREHENSIVE METABOLIC PANEL WITH GFR
ALT: 12 U/L (ref 0–44)
AST: 22 U/L (ref 15–41)
Albumin: 4.5 g/dL (ref 3.5–5.0)
Alkaline Phosphatase: 70 U/L (ref 38–126)
Anion gap: 13 (ref 5–15)
BUN: 36 mg/dL — ABNORMAL HIGH (ref 8–23)
CO2: 25 mmol/L (ref 22–32)
Calcium: 9.7 mg/dL (ref 8.9–10.3)
Chloride: 97 mmol/L — ABNORMAL LOW (ref 98–111)
Creatinine, Ser: 2.33 mg/dL — ABNORMAL HIGH (ref 0.44–1.00)
GFR calc Af Amer: 24 mL/min — ABNORMAL LOW
GFR calc non Af Amer: 21 mL/min — ABNORMAL LOW
Glucose, Bld: 115 mg/dL — ABNORMAL HIGH (ref 70–99)
Potassium: 3.8 mmol/L (ref 3.5–5.1)
Sodium: 135 mmol/L (ref 135–145)
Total Bilirubin: 1.3 mg/dL — ABNORMAL HIGH (ref 0.3–1.2)
Total Protein: 8 g/dL (ref 6.5–8.1)

## 2019-08-10 LAB — URINALYSIS, ROUTINE W REFLEX MICROSCOPIC
Bilirubin Urine: NEGATIVE
Glucose, UA: NEGATIVE mg/dL
Hgb urine dipstick: NEGATIVE
Ketones, ur: NEGATIVE mg/dL
Nitrite: NEGATIVE
Protein, ur: NEGATIVE mg/dL
Specific Gravity, Urine: 1.005 (ref 1.005–1.030)
pH: 5 (ref 5.0–8.0)

## 2019-08-10 LAB — CBC
HCT: 42 % (ref 36.0–46.0)
Hemoglobin: 14.4 g/dL (ref 12.0–15.0)
MCH: 31 pg (ref 26.0–34.0)
MCHC: 34.3 g/dL (ref 30.0–36.0)
MCV: 90.3 fL (ref 80.0–100.0)
Platelets: 256 10*3/uL (ref 150–400)
RBC: 4.65 MIL/uL (ref 3.87–5.11)
RDW: 12.6 % (ref 11.5–15.5)
WBC: 3.8 10*3/uL — ABNORMAL LOW (ref 4.0–10.5)
nRBC: 0 % (ref 0.0–0.2)

## 2019-08-10 LAB — BASIC METABOLIC PANEL
Anion gap: 13 (ref 5–15)
BUN: 33 mg/dL — ABNORMAL HIGH (ref 8–23)
CO2: 23 mmol/L (ref 22–32)
Calcium: 8.9 mg/dL (ref 8.9–10.3)
Chloride: 101 mmol/L (ref 98–111)
Creatinine, Ser: 1.86 mg/dL — ABNORMAL HIGH (ref 0.44–1.00)
GFR calc Af Amer: 31 mL/min — ABNORMAL LOW (ref >60)
GFR calc non Af Amer: 27 mL/min — ABNORMAL LOW (ref >60)
Glucose, Bld: 100 mg/dL — ABNORMAL HIGH (ref 70–99)
Potassium: 3.9 mmol/L (ref 3.5–5.1)
Sodium: 137 mmol/L (ref 135–145)

## 2019-08-10 LAB — HIV ANTIBODY (ROUTINE TESTING W REFLEX): HIV Screen 4th Generation wRfx: NONREACTIVE

## 2019-08-10 LAB — PHOSPHORUS: Phosphorus: 3.4 mg/dL (ref 2.5–4.6)

## 2019-08-10 LAB — MAGNESIUM: Magnesium: 2.6 mg/dL — ABNORMAL HIGH (ref 1.7–2.4)

## 2019-08-10 LAB — CBG MONITORING, ED: Glucose-Capillary: 92 mg/dL (ref 70–99)

## 2019-08-10 LAB — SARS CORONAVIRUS 2 (TAT 6-24 HRS): SARS Coronavirus 2: NEGATIVE

## 2019-08-10 LAB — LIPASE, BLOOD: Lipase: 51 U/L (ref 11–51)

## 2019-08-10 MED ORDER — SODIUM CHLORIDE 0.9 % IV BOLUS
1000.0000 mL | Freq: Once | INTRAVENOUS | Status: AC
  Administered 2019-08-10: 1000 mL via INTRAVENOUS

## 2019-08-10 MED ORDER — ONDANSETRON HCL 4 MG/2ML IJ SOLN: 4.0000 mg | Freq: Four times a day (QID) | INTRAMUSCULAR | Status: DC | PRN

## 2019-08-10 MED ORDER — HEPARIN SODIUM (PORCINE) 5000 UNIT/ML IJ SOLN
5000.0000 [IU] | Freq: Three times a day (TID) | INTRAMUSCULAR | Status: DC
  Filled 2019-08-10: qty 1

## 2019-08-10 MED ORDER — SODIUM CHLORIDE 0.9% FLUSH: 3.0000 mL | Freq: Once | INTRAVENOUS | Status: DC

## 2019-08-10 MED ORDER — ONDANSETRON HCL 4 MG PO TABS: 4.0000 mg | ORAL_TABLET | Freq: Four times a day (QID) | ORAL | Status: DC | PRN

## 2019-08-10 MED ORDER — SODIUM CHLORIDE 0.9 % IV BOLUS
500.0000 mL | Freq: Once | INTRAVENOUS | Status: AC
  Administered 2019-08-10: 500 mL via INTRAVENOUS

## 2019-08-10 NOTE — H&P (Addendum)
Date: 08/10/2019               Patient Name:  Eileen Cameron MRN: [id number]  DOB: [birth year] Age / Sex: 68 y.o., female   PCP: Lucious Groves, DO              Medical Service: Internal Medicine Teaching Service              Attending Physician: Dr. Bartholomew Crews, MD    First Contact: Ms. Heriberto Antigua, [place] Pager: [phone]  Second Contact: Dr. Sharon Seller  Pager: [phone]  Third Contact Dr. Marianna Payment Pager: [phone]       After Hours (After 5p/  First Contact Pager: [phone]  weekends / holidays): Second Contact Pager: [phone]   Chief Complaint: Dizziness and nausea    History of Present Illness: Eileen Cameron is a 68 y.o female with PMHX of HTN, HLD, and spinal osteoarthritis who presented with dizziness, nausea and incidental finding of elevated creatinine in the ED. She reports not feeling like herself for the past two months but since Friday she has had significant dizziness and nausea. The dizziness is exertional and subsides with rest. She has tried Meclizine with mild relief. Denies syncopal events. The nausea has been constant. She vomited water once after taking tylenol for pain. No diarrhea or constipation, regular BMs.    Her doctor told her she has worsening kidney function about a week ago and today she decided to come to the ED because she was not feeling like her normal self. Found to have elevated creatinine of 2.3 in the ED, started on NS bolus.   She has no urinary complaints; no dysuria, increased frequency, back pain, fevers or hematuria. She reports making normal amount of urine. No recent fevers, sick contacts or recent changes to her medications besides addition of flexaril. She has not been taking this everyday but only sometimes as needed.   Meds: Current Facility-Administered Medications  Medication Dose Route Frequency Provider Last Rate Last Dose  . heparin injection 5,000 Units  5,000 Units Subcutaneous Q8H Seawell, Jaimie A, DO      . ondansetron (ZOFRAN) tablet 4  mg  4 mg Oral Q6H PRN Seawell, Jaimie A, DO       Or  . ondansetron (ZOFRAN) injection 4 mg  4 mg Intravenous Q6H PRN Seawell, Jaimie A, DO      . sodium chloride flush (NS) 0.9 % injection 3 mL  3 mL Intravenous Once Carmin Muskrat, MD       Current Outpatient Medications  Medication Sig Dispense Refill  . Cholecalciferol (VITAMIN D3 PO) Take 1 tablet by mouth daily.    . cyclobenzaprine (FLEXERIL) 5 MG tablet Take 10 mg by mouth 3 (three) times daily as needed for muscle spasms.    . diclofenac sodium (VOLTAREN) 1 % GEL Apply 4 g topically 4 (four) times daily. 100 g 3  . meclizine (ANTIVERT) 25 MG tablet Take 1 tablet (25 mg total) by mouth 3 (three) times daily as needed for dizziness. 30 tablet 1  . olmesartan-hydrochlorothiazide (BENICAR HCT) 40-25 MG tablet Take 1 tablet by mouth daily. 90 tablet 3  . Omega-3 Fatty Acids (FISH OIL PO) Take 1 capsule by mouth daily.      Allergies: Allergies as of 08/10/2019 - Review Complete 08/10/2019  Allergen Reaction Noted  . Ace inhibitors Swelling 04/01/2009   Past Medical History:  Diagnosis Date  . Adrenal incidentaloma ([place]) 07/08/2015   1.0 by 1.4 cm lateral limb left  adrenal mass on CT abdomen on 07/08/15 Followed up in April 2018, stable no further imaging required.  . Allergy    seasonal allergies, ace inhibitors--angioedema  . Hyperlipidemia   . Hypertension    Past Surgical History:  Procedure Laterality Date  . COLONOSCOPY N/A 01/08/2013   Procedure: COLONOSCOPY;  Surgeon: Arta Silence, MD;  Location: [facility];  Service: Endoscopy;  Laterality: N/A;  . ECTOPIC PREGNANCY SURGERY  1970s  . IR RADIOLOGY PERIPHERAL GUIDED IV START  07/02/2018  . IR US GUIDE VASC ACCESS RIGHT  07/02/2018  . right breast abscess drainage    . TONSILLECTOMY    . TUBAL LIGATION     Family Hx: Hx of diabetes and heart disease.   Social Hx: She reports no smoking. Distant drinking history, she no longer drinks. No illicit substance use.    Review of Systems: Review of Systems  Constitutional: Positive for weight loss. Negative for fever.  HENT: Positive for sinus pain.   Respiratory: Negative for cough and shortness of breath.   Cardiovascular: Negative for chest pain, palpitations and leg swelling.  Gastrointestinal: Positive for nausea and vomiting. Negative for abdominal pain, blood in stool, constipation and diarrhea.  Genitourinary: Negative for dysuria, flank pain, frequency, hematuria and urgency.  Musculoskeletal: Positive for joint pain. Negative for myalgias.  Neurological: Positive for dizziness.   Vitals: Blood pressure (!) 131/57, pulse 68, temperature 98.2 F (36.8 C), temperature source Oral, resp. rate 15, height 5\' 3"  (1.6 m), weight 81.6 kg, SpO2 100 %.  Physical Exam  Constitutional: She appears well-developed and well-nourished. No distress.  HENT:  Head: Normocephalic and atraumatic.  Neck: No JVD present.  Cardiovascular: Normal rate, regular rhythm and intact distal pulses.  Murmur heard. Pulmonary/Chest: Effort normal and breath sounds normal. She has no wheezes.  Abdominal: Soft. Bowel sounds are normal. She exhibits no distension. There is no abdominal tenderness. There is no rebound and no guarding.  Psychiatric: She has a normal mood and affect.   Lab results: Urinalysis    Component Value Date/Time   COLORURINE STRAW (A) 08/10/2019 1153   APPEARANCEUR CLEAR 08/10/2019 1153   LABSPEC 1.005 08/10/2019 1153   PHURINE 5.0 08/10/2019 1153   GLUCOSEU NEGATIVE 08/10/2019 1153   HGBUR NEGATIVE 08/10/2019 1153   BILIRUBINUR NEGATIVE 08/10/2019 1153   KETONESUR NEGATIVE 08/10/2019 1153   PROTEINUR NEGATIVE 08/10/2019 1153   UROBILINOGEN 0.2 01/14/2014 0952   NITRITE NEGATIVE 08/10/2019 1153   LEUKOCYTESUR SMALL (A) 08/10/2019 1153   CBC Latest Ref Rng & Units 08/10/2019 02/14/2018 12/22/2017  WBC 4.0 - 10.5 K/uL 3.8(L) 3.9 4.9  Hemoglobin 12.0 - 15.0 g/dL 14.4 13.4 13.5  Hematocrit 36.0  - 46.0 % 42.0 39.6 40.0  Platelets 150 - 400 K/uL 256 293 245   CMP Latest Ref Rng & Units 08/10/2019 08/10/2019 12/26/2018  Glucose 70 - 99 mg/dL 100(H) 115(H) 92  BUN 8 - 23 mg/dL 33(H) 36(H) 19  Creatinine 0.44 - 1.00 mg/dL 1.86(H) 2.33(H) 0.93  Sodium 135 - 145 mmol/L 137 135 140  Potassium 3.5 - 5.1 mmol/L 3.9 3.8 4.7  Chloride 98 - 111 mmol/L 101 97(L) 101  CO2 22 - 32 mmol/L 23 25 22   Calcium 8.9 - 10.3 mg/dL 8.9 9.7 9.7  Total Protein 6.5 - 8.1 g/dL - 8.0 7.6  Total Bilirubin 0.3 - 1.2 mg/dL - 1.3(H) 0.3  Alkaline Phos 38 - 126 U/L - 70 107  AST 15 - 41 U/L - 22 20  ALT 0 -  44 U/L - 12 9   Imaging results:  U/S Renal: Left and right kidney with out masses or hydronephrosis. Normal appearing bladder for degree of bladder distention.   Other results: EKG: Sinus rhythm with possible atrial enlargement.    Assessment & Plan by Problem: Active Problems:   AKI (acute kidney injury) ([place])  Stage II AKI: Presents with 2 day history of dizziness and nausea with elevated creatinine on arrival to ED. Creatinine up at 2.33 from her baseline of 0.93 and GFR low from 73 to 24. UA with small leukocytes and hyaline casts, no protein or blood. Recusitated with fluids in the ED and creatinine improving. Improvement with fluids, dizziness and recent episode of vomiting puts prerenal AKI due to volume depletion in the setting of ARB at the top of the differential. Postrenal obstructive AKI less likely because renal u/s with normal bladder and w/o hydronephrosis.  -- Ondansetron 4 mg for nausea  -- hold olmesartan  -- Trend creatinine with repeat CMP -- Sodium chloride 0.9% bolus  -- Routine CBC  Leukopenia: WBC low at 3.8 on arrival. Checked HIV antibody.  -- follow HIV antibody   Diet: Heart healthy diet DVT: Heparin 5000 units  Dispo: Admit for observation. Discharge likely in 1-2 days if creatinine recovers.    This is a Careers information officer Note.  The care of the patient was discussed with  Dr. Marianna Payment and the assessment and plan was formulated with their assistance.  Please see their note for official documentation of the patient encounter.   Signed: Evaristo Bury, Medical Student 08/10/2019, 4:25 PM   Attestation for Student Documentation:  I personally was present and performed or re-performed the history, physical exam and medical decision-making activities of this service and have verified that the service and findings are accurately documented in the student's note.  Seawell, Jaimie A, DO 08/10/2019, 9:12 PM

## 2019-08-10 NOTE — ED Notes (Signed)
Admitting team at bedside.

## 2019-08-10 NOTE — ED Notes (Signed)
Urine Culture sent with Urinalysis. No order for this at this time

## 2019-08-10 NOTE — ED Notes (Signed)
Patient transported to Ultrasound 

## 2019-08-10 NOTE — ED Provider Notes (Signed)
[facility] EMERGENCY DEPARTMENT Provider Note   CSN: [id number] Arrival date & time: 08/10/19  [id number]     History   Chief Complaint Chief Complaint  Patient presents with  . Dizziness  . Nausea  . Allergies    HPI Eileen Cameron is a 68 y.o. female.     HPI  Patient presents concern of dizziness, nausea, vomiting. Onset was a few days ago, with symptoms that have been increasingly present with attempts at ambulation, activity. Symptoms are not necessarily predictable, with head motion, upright positioning or anything, but with symptoms are severe, she has substantial nausea, dizziness, but no syncope. She has had multiple episodes of vomiting, but no diarrhea, has no abdominal pain, no chest pain, no dyspnea. She notes that she has a history of vertigo, though this is somewhat dissimilar to that. Since onset no medication taken for relief.   Past Medical History:  Diagnosis Date  . Adrenal incidentaloma ([place]) 07/08/2015   1.0 by 1.4 cm lateral limb left adrenal mass on CT abdomen on 07/08/15 Followed up in April 2018, stable no further imaging required.  . Allergy    seasonal allergies, ace inhibitors--angioedema  . Hyperlipidemia   . Hypertension     Patient Active Problem List   Diagnosis Date Noted  . Frontal sinus pain 04/23/2019  . Sinus infection 03/27/2019  . Jaw pain 07/04/2018  . Palpitations 02/18/2018  . Visual floaters, bilateral 02/18/2018  . Primary osteoarthritis of right knee 02/14/2018  . Neck pain, musculoskeletal 12/27/2017  . Vertigo 12/27/2017  . Obesity (BMI 30.0-34.9) 08/09/2017  . Vitamin D insufficiency 08/07/2017  . Back pain 04/05/2017  . Myalgia due to statin 02/08/2017  . Left shoulder pain 02/08/2017  . Post-nasal drip 10/10/2016  . Risk for coronary artery disease between 10% and 20% in next 10 years 08/11/2016  . History of statin therapy 08/10/2016  . Encounter for immunization 07/18/2016  . Right shoulder pain  04/20/2016  . Leg cramps 11/18/2015  . GERD (gastroesophageal reflux disease) 11/28/2014  . Itching 10/16/2014  . Insomnia 09/18/2014  . Seasonal allergies 12/31/2013  . Fatigue due to sleep pattern disturbance 05/13/2013  . Preventative health care 08/26/2012  . Essential hypertension 04/01/2009  . Lumbar back pain with radiculopathy affecting right lower extremity 04/01/2009    Past Surgical History:  Procedure Laterality Date  . COLONOSCOPY N/A 01/08/2013   Procedure: COLONOSCOPY;  Surgeon: Arta Silence, MD;  Location: [facility];  Service: Endoscopy;  Laterality: N/A;  . ECTOPIC PREGNANCY SURGERY  1970s  . IR RADIOLOGY PERIPHERAL GUIDED IV START  07/02/2018  . IR US GUIDE VASC ACCESS RIGHT  07/02/2018  . right breast abscess drainage    . TONSILLECTOMY    . TUBAL LIGATION       OB History   No obstetric history on file.      Home Medications    Prior to Admission medications   Medication Sig Start Date End Date Taking? Authorizing Provider  Cholecalciferol (VITAMIN D3 PO) Take 1 tablet by mouth daily.   Yes [provider]  cyclobenzaprine (FLEXERIL) 5 MG tablet Take 10 mg by mouth 3 (three) times daily as needed for muscle spasms. 07/21/19  Yes [provider]  diclofenac sodium (VOLTAREN) 1 % GEL Apply 4 g topically 4 (four) times daily. 12/26/18  Yes Lucious Groves, DO  meclizine (ANTIVERT) 25 MG tablet Take 1 tablet (25 mg total) by mouth 3 (three) times daily as needed for dizziness. 03/11/19  Yes Lucious Groves, DO  olmesartan-hydrochlorothiazide (BENICAR HCT) 40-25 MG tablet Take 1 tablet by mouth daily. 01/17/19  Yes Lucious Groves, DO  Omega-3 Fatty Acids (FISH OIL PO) Take 1 capsule by mouth daily.   Yes [provider]    Family History Family History  Problem Relation Age of Onset  . Heart disease Mother 61  . Diabetes Mother   . Hypertension Sister   . Heart disease Maternal Grandmother     Social History Social History    Tobacco Use  . Smoking status: Never Smoker  . Smokeless tobacco: Never Used  Substance Use Topics  . Alcohol use: No    Alcohol/week: 0.0 standard drinks  . Drug use: No     Allergies   Ace inhibitors   Review of Systems Review of Systems  Constitutional:       Per HPI, otherwise negative  HENT:       Per HPI, otherwise negative  Respiratory:       Per HPI, otherwise negative  Cardiovascular:       Per HPI, otherwise negative  Gastrointestinal: Positive for nausea and vomiting. Negative for abdominal pain.  Endocrine:       Negative aside from HPI  Genitourinary:       Neg aside from HPI   Musculoskeletal:       Per HPI, otherwise negative  Skin: Negative.   Neurological: Positive for dizziness and light-headedness. Negative for syncope.     Physical Exam Updated Vital Signs BP 130/63   Pulse 71   Temp 98.2 F (36.8 C) (Oral)   Resp 11   Ht 5\' 3"  (1.6 m)   Wt 81.6 kg   SpO2 100%   BMI 31.89 kg/m   Physical Exam Vitals signs and nursing note reviewed.  Constitutional:      General: She is not in acute distress.    Appearance: She is well-developed.  HENT:     Head: Normocephalic and atraumatic.  Eyes:     Conjunctiva/sclera: Conjunctivae normal.  Cardiovascular:     Rate and Rhythm: Normal rate and regular rhythm.  Pulmonary:     Effort: Pulmonary effort is normal. No respiratory distress.     Breath sounds: Normal breath sounds. No stridor.  Abdominal:     General: There is no distension.  Skin:    General: Skin is warm and dry.  Neurological:     General: No focal deficit present.     Mental Status: She is alert and oriented to person, place, and time.     Cranial Nerves: No cranial nerve deficit.     Motor: No tremor or atrophy.     Comments: At rest, supine, symptoms are appreciable, head rotation does not produce nausea, no vomiting.      ED Treatments / Results  Labs (all labs ordered are listed, but only abnormal results are  displayed) Labs Reviewed  CBC - Abnormal; Notable for the following components:      Result Value   WBC 3.8 (*)    All other components within normal limits  URINALYSIS, ROUTINE W REFLEX MICROSCOPIC - Abnormal; Notable for the following components:   Color, Urine STRAW (*)    Leukocytes,Ua SMALL (*)    Bacteria, UA RARE (*)    All other components within normal limits  COMPREHENSIVE METABOLIC PANEL - Abnormal; Notable for the following components:   Chloride 97 (*)    Glucose, Bld 115 (*)    BUN 36 (*)  Creatinine, Ser 2.33 (*)    Total Bilirubin 1.3 (*)    GFR calc non Af Amer 21 (*)    GFR calc Af Amer 24 (*)    All other components within normal limits  SARS CORONAVIRUS 2 (TAT 6-24 HRS)  LIPASE, BLOOD  CBG MONITORING, ED    EKG EKG Interpretation  Date/Time:  Sunday August 10 2019 09:40:56 EDT Ventricular Rate:  70 PR Interval:    QRS Duration: 74 QT Interval:  391 QTC Calculation: 422 R Axis:   57 Text Interpretation:  Sinus rhythm Consider left atrial enlargement Artifact Otherwise within normal limits Confirmed by Carmin Muskrat [phone]) on 08/10/2019 10:00:34 AM   Radiology No results found.  Procedures Procedures (including critical care time)  Medications Ordered in ED Medications  sodium chloride flush (NS) 0.9 % injection 3 mL (3 mLs Intravenous Not Given 08/10/19 1145)  sodium chloride 0.9 % bolus 500 mL (0 mLs Intravenous Stopped 08/10/19 1304)  sodium chloride 0.9 % bolus 1,000 mL (0 mLs Intravenous Stopped 08/10/19 1304)     Initial Impression / Assessment and Plan / ED Course  I have reviewed the triage vital signs and the nursing notes.  Pertinent labs & imaging results that were available during my care of the patient were reviewed by me and considered in my medical decision making (see chart for details).  Clinical Course as of Aug 09 1316  Nancy Fetter Aug 10, 2019  [id number] Basic metabolic panel [DB]    Clinical Course User Index [DB] Laurena Bering      Labs notable for acute kidney injury with a creatinine greater than 2, BUN greater than 30. Patient has no abdominal pain, has no fever, no abdominal pain is reassuring for low suspicion for obstructive pathology, with elevated BUN, some suspicion for dehydration contributing the patient's AKI, dizziness. Patient is receiving fluid resuscitation, and given her symptoms, will require admission for further monitoring, management.   Final Clinical Impressions(s) / ED Diagnoses   Final diagnoses:  AKI (acute kidney injury) ([place])  Dizziness     Carmin Muskrat, MD 08/10/19 1320

## 2019-08-10 NOTE — ED Notes (Signed)
Dinner tray delivered to patient.

## 2019-08-10 NOTE — ED Triage Notes (Signed)
Pt. Stated, Eileen Cameron had little sick spells for about 2 months. I will get a little nauseated and have to sit down. I do have vertigo, so that might be acting up.

## 2019-08-10 NOTE — ED Notes (Signed)
ED TO INPATIENT HANDOFF REPORT  ED Nurse Name and Phone #:   S Name/Age/Gender Eileen Cameron 68 y.o. female Room/Bed: 023C/023C  Code Status   Code Status: Full Code  Home/SNF/Other Home Patient oriented to: self, place, time and situation Is this baseline? No   Triage Complete: Triage complete  Chief Complaint General weakness  Triage Note Pt. Stated, Eileen Cameron had little sick spells for about 2 months. I will get a little nauseated and have to sit down. I do have vertigo, so that might be acting up.  Pt. Stated, I have really bad allergies and my nose is burning.   Allergies Allergies  Allergen Reactions  . Ace Inhibitors Swelling    Level of Care/Admitting Diagnosis ED Disposition    ED Disposition Condition Eileen Cameron Hospital Area: [place] [100100]  Level of Care: Med-Surg [16]  Covid Evaluation: Asymptomatic Screening Protocol (No Symptoms)  [facility]: [facility]acute kidney [facility]) [id number]  Admitting Physician: Aline Brochure  Attending Physician: Larey Dresser A [[id number]]  PT Class (Do Not Modify): Observation [104]  PT Acc Code (Do Not Modify): Observation [10022]       B Medical/Surgery History Past Medical History:  Diagnosis Date  . Adrenal incidentaloma ([place]) 07/08/2015   1.0 by 1.4 cm lateral limb left adrenal mass on CT abdomen on 07/08/15 Followed up in April 2018, stable no further imaging required.  . Allergy    seasonal allergies, ace inhibitors--angioedema  . Hyperlipidemia   . Hypertension    Past Surgical History:  Procedure Laterality Date  . COLONOSCOPY N/A 01/08/2013   Procedure: COLONOSCOPY;  Surgeon: Arta Silence, MD;  Location: [facility]COPY;  Service: Endoscopy;  Laterality: N/A;  . ECTOPIC PREGNANCY SURGERY  1970s  . IR RADIOLOGY PERIPHERAL GUIDED IV START  07/02/2018  . IR US GUIDE VASC ACCESS RIGHT  07/02/2018  . right breast abscess drainage    . TONSILLECTOMY    . TUBAL LIGATION        A IV Location/Drains/Wounds Patient Lines/Drains/Airways Status   Active Line/Drains/Airways    Name:   Placement date:   Placement time:   Site:   Days:   Peripheral IV 08/10/19 Right Forearm   08/10/19    1112    Forearm   less than 1   Wound / Incision (Open or Dehisced) 07/12/14 Laceration Finger (Comment which one) Right 1cm lac to RT thumb   07/12/14    1628    Finger (Comment which one)   1855          Intake/Output Last 24 hours No intake or output data in the 24 hours ending 08/10/19 1949  Labs/Imaging Results for orders placed or performed during the hospital encounter of 08/10/19 (from the past 48 hour(s))  CBC     Status: Abnormal   Collection Time: 08/10/19 10:17 AM  Result Value Ref Range   WBC 3.8 (L) 4.0 - 10.5 K/uL   RBC 4.65 3.87 - 5.11 MIL/uL   Hemoglobin 14.4 12.0 - 15.0 g/dL   HCT 42.0 36.0 - 46.0 %   MCV 90.3 80.0 - 100.0 fL   MCH 31.0 26.0 - 34.0 pg   MCHC 34.3 30.0 - 36.0 g/dL   RDW 12.6 11.5 - 15.5 %   Platelets 256 150 - 400 K/uL   nRBC 0.0 0.0 - 0.2 %    Comment: Performed at [facility], [place] [address], [place], [place] [address]  Comprehensive metabolic  panel     Status: Abnormal   Collection Time: 08/10/19 10:17 AM  Result Value Ref Range   Sodium 135 135 - 145 mmol/L   Potassium 3.8 3.5 - 5.1 mmol/L   Chloride 97 (L) 98 - 111 mmol/L   CO2 25 22 - 32 mmol/L   Glucose, Bld 115 (H) 70 - 99 mg/dL   BUN 36 (H) 8 - 23 mg/dL   Creatinine, Ser 2.33 (H) 0.44 - 1.00 mg/dL   Calcium 9.7 8.9 - 10.3 mg/dL   Total Protein 8.0 6.5 - 8.1 g/dL   Albumin 4.5 3.5 - 5.0 g/dL   AST 22 15 - 41 U/L   ALT 12 0 - 44 U/L   Alkaline Phosphatase 70 38 - 126 U/L   Total Bilirubin 1.3 (H) 0.3 - 1.2 mg/dL   GFR calc non Af Amer 21 (L) >60 mL/min   GFR calc Af Amer 24 (L) >60 mL/min   Anion gap 13 5 - 15    Comment: Performed at [facility], [place] [address], [place] [address]  Lipase, blood     Status: None   Collection Time: 08/10/19  10:17 AM  Result Value Ref Range   Lipase 51 11 - 51 U/L    Comment: Performed at [facility] [address] [place], [place] [address]  CBG monitoring, ED     Status: None   Collection Time: 08/10/19 11:44 AM  Result Value Ref Range   Glucose-Capillary 92 70 - 99 mg/dL  Urinalysis, Routine w reflex microscopic     Status: Abnormal   Collection Time: 08/10/19 11:53 AM  Result Value Ref Range   Color, Urine STRAW (A) YELLOW   APPearance CLEAR CLEAR   Specific Gravity, Urine 1.005 1.005 - 1.030   pH 5.0 5.0 - 8.0   Glucose, UA NEGATIVE NEGATIVE mg/dL   Hgb urine dipstick NEGATIVE NEGATIVE   Bilirubin Urine NEGATIVE NEGATIVE   Ketones, ur NEGATIVE NEGATIVE mg/dL   Protein, ur NEGATIVE NEGATIVE mg/dL   Nitrite NEGATIVE NEGATIVE   Leukocytes,Ua SMALL (A) NEGATIVE   RBC / HPF 0-5 0 - 5 RBC/hpf   WBC, UA 0-5 0 - 5 WBC/hpf   Bacteria, UA RARE (A) NONE SEEN   Squamous Epithelial / LPF 0-5 0 - 5   Mucus PRESENT    Hyaline Casts, UA PRESENT     Comment: Performed at [facility], [address] [place], [place] [id number]  Basic metabolic panel     Status: Abnormal   Collection Time: 08/10/19  2:22 PM  Result Value Ref Range   Sodium 137 135 - 145 mmol/L   Potassium 3.9 3.5 - 5.1 mmol/L   Chloride 101 98 - 111 mmol/L   CO2 23 22 - 32 mmol/L   Glucose, Bld 100 (H) 70 - 99 mg/dL   BUN 33 (H) 8 - 23 mg/dL   Creatinine, Ser 1.86 (H) 0.44 - 1.00 mg/dL   Calcium 8.9 8.9 - 10.3 mg/dL   GFR calc non Af Amer 27 (L) >60 mL/min   GFR calc Af Amer 31 (L) >60 mL/min   Anion gap 13 5 - 15    Comment: Performed at [facility] [address] [place], [place] [address]  HIV Antibody (routine testing w rflx)     Status: None   Collection Time: 08/10/19  2:22 PM  Result Value Ref Range   HIV Screen 4th Generation wRfx NON REACTIVE  NON REACTIVE    Comment: Performed at [facility], [place] [address] [place], [place] [address]  Magnesium     Status: Abnormal    Collection Time: 08/10/19  2:22 PM  Result Value Ref Range   Magnesium 2.6 (H) 1.7 - 2.4 mg/dL    Comment: Performed at [facility] [address] [place], [place] [address]  Phosphorus     Status: None   Collection Time: 08/10/19  2:22 PM  Result Value Ref Range   Phosphorus 3.4 2.5 - 4.6 mg/dL    Comment: Performed at [facility] [address], [place], [place] [address]   US Renal  Result Date: 08/10/2019 CLINICAL DATA:  68 year old female with a history acute kidney injury EXAM: RENAL / URINARY TRACT ULTRASOUND COMPLETE COMPARISON:  None. FINDINGS: Right Kidney: Length: 9.0 cm x 4.5 cm x 4.6 cm, 97 cc. Echogenicity within normal limits. No mass or hydronephrosis visualized. Left Kidney: Length: 9.1 cm x 5.6 cm x 5.6 cm, 147 cc. Echogenicity within normal limits. No mass or hydronephrosis visualized. Bladder: Appears normal for degree of bladder distention. IMPRESSION: Sonographic survey negative for hydronephrosis. Electronically Signed   By: Corrie Mckusick D.O.   On: 08/10/2019 15:20    Pending Labs Unresulted Labs (From admission, onward)    Start     Ordered   08/11/19 0500  CBC  Tomorrow morning,   R     08/10/19 1403   08/10/19 1349  HIV4GL Save Tube  (HIV Antibody (Routine testing w reflex) panel)  Once,   STAT     08/10/19 1403   08/10/19 1225  SARS CORONAVIRUS 2 (TAT 6-24 HRS) Nasopharyngeal Nasopharyngeal Swab  (Asymptomatic/Tier 2 Patients Labs)  Once,   STAT    Question Answer Comment  Is this test for diagnosis or screening Screening   Symptomatic for COVID-19 as defined by CDC No   Hospitalized for COVID-19 No   Admitted to ICU for COVID-19 No   Previously tested for COVID-19 No   Resident in a congregate (group) care setting No   Employed in healthcare setting No   Pregnant No      08/10/19 1225   Pending  Comprehensive metabolic panel Once  Tomorrow morning,   R     Pending   Pending  CBC Once  Tomorrow morning,   R     Pending           Vitals/Pain Today's Vitals   08/10/19 1730 08/10/19 1800 08/10/19 1815 08/10/19 1842  BP: (!) 102/51 114/60 (!) 120/55   Pulse: 71 86 93 84  Resp: 11 14 14  (!) 28  Temp:      TempSrc:      SpO2: 99% 98% 99% 100%  Weight:      Height:      PainSc:        Isolation Precautions No active isolations  Medications Medications  sodium chloride flush (NS) 0.9 % injection 3 mL (3 mLs Intravenous Not Given 08/10/19 1145)  heparin injection 5,000 Units (has no administration in time range)  ondansetron (ZOFRAN) tablet 4 mg (has no administration in time range)    Or  ondansetron (ZOFRAN) injection 4 mg (has no administration in time range)  sodium chloride 0.9 % bolus 500 mL (0 mLs Intravenous Stopped 08/10/19 1304)  sodium chloride 0.9 % bolus 1,000 mL (0 mLs Intravenous Stopped 08/10/19 1304)    Mobility walks Low fall risk   Focused Assessments  Renal Assessment Handoff:  Hemodialysis Schedule:  Last Hemodialysis date and time:    Restricted appendage: N/A     R Recommendations: See Admitting Provider Note  Report given to:   Additional Notes:

## 2019-08-10 NOTE — ED Triage Notes (Signed)
Pt. Stated, I have really bad allergies and my nose is burning.

## 2019-08-11 DIAGNOSIS — R110 Nausea: Secondary | ICD-10-CM | POA: Diagnosis not present

## 2019-08-11 DIAGNOSIS — Z888 Allergy status to other drugs, medicaments and biological substances status: Secondary | ICD-10-CM

## 2019-08-11 DIAGNOSIS — R42 Dizziness and giddiness: Secondary | ICD-10-CM | POA: Diagnosis not present

## 2019-08-11 DIAGNOSIS — D72819 Decreased white blood cell count, unspecified: Secondary | ICD-10-CM | POA: Diagnosis not present

## 2019-08-11 DIAGNOSIS — M479 Spondylosis, unspecified: Secondary | ICD-10-CM

## 2019-08-11 DIAGNOSIS — I10 Essential (primary) hypertension: Secondary | ICD-10-CM

## 2019-08-11 DIAGNOSIS — E785 Hyperlipidemia, unspecified: Secondary | ICD-10-CM

## 2019-08-11 DIAGNOSIS — R21 Rash and other nonspecific skin eruption: Secondary | ICD-10-CM | POA: Diagnosis not present

## 2019-08-11 DIAGNOSIS — N179 Acute kidney failure, unspecified: Secondary | ICD-10-CM | POA: Diagnosis not present

## 2019-08-11 DIAGNOSIS — R0789 Other chest pain: Secondary | ICD-10-CM | POA: Diagnosis not present

## 2019-08-11 DIAGNOSIS — Z20828 Contact with and (suspected) exposure to other viral communicable diseases: Secondary | ICD-10-CM | POA: Diagnosis not present

## 2019-08-11 LAB — COMPREHENSIVE METABOLIC PANEL
ALT: 10 U/L (ref 0–44)
AST: 19 U/L (ref 15–41)
Albumin: 4.1 g/dL (ref 3.5–5.0)
Alkaline Phosphatase: 72 U/L (ref 38–126)
Anion gap: 10 (ref 5–15)
BUN: 21 mg/dL (ref 8–23)
CO2: 22 mmol/L (ref 22–32)
Calcium: 9.3 mg/dL (ref 8.9–10.3)
Chloride: 107 mmol/L (ref 98–111)
Creatinine, Ser: 1.2 mg/dL — ABNORMAL HIGH (ref 0.44–1.00)
GFR calc Af Amer: 53 mL/min — ABNORMAL LOW (ref >60)
GFR calc non Af Amer: 46 mL/min — ABNORMAL LOW (ref >60)
Glucose, Bld: 123 mg/dL — ABNORMAL HIGH (ref 70–99)
Potassium: 3.5 mmol/L (ref 3.5–5.1)
Sodium: 139 mmol/L (ref 135–145)
Total Bilirubin: 1 mg/dL (ref 0.3–1.2)
Total Protein: 7.3 g/dL (ref 6.5–8.1)

## 2019-08-11 LAB — CBC WITH DIFFERENTIAL/PLATELET
Abs Immature Granulocytes: 0 10*3/uL (ref 0.00–0.07)
Basophils Absolute: 0 10*3/uL (ref 0.0–0.1)
Basophils Relative: 1 %
Eosinophils Absolute: 0 10*3/uL (ref 0.0–0.5)
Eosinophils Relative: 0 %
HCT: 38.8 % (ref 36.0–46.0)
Hemoglobin: 13.7 g/dL (ref 12.0–15.0)
Immature Granulocytes: 0 %
Lymphocytes Relative: 49 %
Lymphs Abs: 1.5 10*3/uL (ref 0.7–4.0)
MCH: 32 pg (ref 26.0–34.0)
MCHC: 35.3 g/dL (ref 30.0–36.0)
MCV: 90.7 fL (ref 80.0–100.0)
Monocytes Absolute: 0.5 10*3/uL (ref 0.1–1.0)
Monocytes Relative: 16 %
Neutro Abs: 1.1 10*3/uL — ABNORMAL LOW (ref 1.7–7.7)
Neutrophils Relative %: 34 %
Platelets: 246 10*3/uL (ref 150–400)
RBC: 4.28 MIL/uL (ref 3.87–5.11)
RDW: 12.9 % (ref 11.5–15.5)
WBC: 3.1 10*3/uL — ABNORMAL LOW (ref 4.0–10.5)
nRBC: 0 % (ref 0.0–0.2)

## 2019-08-11 MED ORDER — TRIAMCINOLONE 0.1 % CREAM:EUCERIN CREAM 1:1
TOPICAL_CREAM | Freq: Two times a day (BID) | CUTANEOUS | Status: DC
  Filled 2019-08-11: qty 1

## 2019-08-11 NOTE — Discharge Summary (Signed)
Name: Eileen Cameron MRN: [id number] DOB: [birth year] 68 y.o. PCP: Lucious Groves, DO  Date of Admission: 08/10/2019  8:42 AM Date of Discharge:  Attending Physician: No att. providers found  Discharge Diagnosis: 1. Acute Kidney Injury   Discharge Medications: None  Allergies as of 08/11/2019      Reactions   Ace Inhibitors Swelling      Medication List    STOP taking these medications   olmesartan-hydrochlorothiazide 40-25 MG tablet Commonly known as: BENICAR HCT     TAKE these medications   cyclobenzaprine 5 MG tablet Commonly known as: FLEXERIL Take 10 mg by mouth 3 (three) times daily as needed for muscle spasms.   diclofenac sodium 1 % Gel Commonly known as: VOLTAREN Apply 4 g topically 4 (four) times daily.   FISH OIL PO Take 1 capsule by mouth daily.   meclizine 25 MG tablet Commonly known as: ANTIVERT Take 1 tablet (25 mg total) by mouth 3 (three) times daily as needed for dizziness.   VITAMIN D3 PO Take 1 tablet by mouth daily.       Disposition and follow-up:   [name] was discharged from [facility] in Stable condition.  At the hospital follow up visit please address:  1.  Stage II AKI: Elevated creatinine in ED, down-trending after fluid bolus. Consider repeat CMP in about 1 week.  Leukopenia: Incidental finding of neutropenia at 1.1. Smear ordered but was not performed at the hospital. Patient asymptomatic, consider repeat CBC with differential and smear at outpatient follow up.  2.  Labs / imaging needed at time of follow-up: CMP, CBC w/diff  3.  Pending labs/ test needing follow-up: Blood smear (was not performed prior to discharge) We will need to follow up on this study in the outpatient setting.   Follow-up Appointments: 1 week hospital follow up appointment in [facility].  Hospital Course by problem list: 1. Stage II AKI: Presented on 10/04 with dizziness and  nausea of 2 month hx that got worse 2 days prior to presentation. 1 episode of vomiting. Workup in ED with elevated creatinine of 2.3 up from her baseline of 0.9. Fluid bolus in ED and overnight. Repeat creatinines 1.86->1.20. Patient stable and asymptomatic at discharge.   Discharge Vitals:   BP 122/61 (BP Location: Left Arm)   Pulse 66   Temp 98 F (36.7 C) (Oral)   Resp 16   Ht 5\' 3"  (1.6 m)   Wt 81.6 kg   SpO2 100%   BMI 31.87 kg/m   Pertinent Labs, Studies, and Procedures:   CMP Latest Ref Rng & Units 08/11/2019 08/10/2019 08/10/2019  Glucose 70 - 99 mg/dL 123(H) 100(H) 115(H)  BUN 8 - 23 mg/dL 21 33(H) 36(H)  Creatinine 0.44 - 1.00 mg/dL 1.20(H) 1.86(H) 2.33(H)  Sodium 135 - 145 mmol/L 139 137 135  Potassium 3.5 - 5.1 mmol/L 3.5 3.9 3.8  Chloride 98 - 111 mmol/L 107 101 97(L)  CO2 22 - 32 mmol/L 22 23 25   Calcium 8.9 - 10.3 mg/dL 9.3 8.9 9.7  Total Protein 6.5 - 8.1 g/dL 7.3 - 8.0  Total Bilirubin 0.3 - 1.2 mg/dL 1.0 - 1.3(H)  Alkaline Phos 38 - 126 U/L 72 - 70  AST 15 - 41 U/L 19 - 22  ALT 0 - 44 U/L 10 - 12   CBC CBC Latest Ref Rng & Units 08/11/2019 08/10/2019 02/14/2018  WBC 4.0 - 10.5 K/uL 3.1(L) 3.8(L)  3.9  Hemoglobin 12.0 - 15.0 g/dL 13.7 14.4 13.4  Hematocrit 36.0 - 46.0 % 38.8 42.0 39.6  Platelets 150 - 400 K/uL 246 256 293   Renal ultrasound (10/04): Normal kidney w/o hydronephrosis and normal bladder.   Discharge Instructions: Discharge Instructions    Diet - low sodium heart healthy   Complete by: As directed    Discharge instructions   Complete by: As directed    You were hospitalized for acute kidney injury. Thank you for allowing [place] to be part of your care.   We arranged for you to follow up at: At the internal medicine clinic at [facility].  Appointment scheduled for Friday 08/15/2019 at 10:15 AM.   Please note these changes made to your medications: Continue home medications  Stop taking olmesartan-hydrochlorothiazide (Benicar)    Please make  sure to follow-up at our clinic for kidney function testing  Please call our clinic if you have any questions or concerns, we may be able to help and keep you from a long and expensive emergency room wait. Our clinic and after hours phone number is [phone], the best time to call is Monday through Friday 9 am to 4 pm but there is always someone available 24/7 if you have an emergency. If you need medication refills please notify your pharmacy one week in advance and they will send [place] a request.   Increase activity slowly   Complete by: As directed     You were admitted to [facility] with injury to your kidneys. We gave you fluids which improved your kidney function significantly. Your injury was likely from dehydration.   Please follow up with your PCP Dr. Heber Mequon in about a week. They can help manage your care moving forward. If your symptoms get worse, don't hesitate to reach out to your PCP.   It was a pleasure taking care of you. We wish you a speedy recovery.   Signed: Marianna Payment, MD 08/13/2019, 6:09 AM   Pager: [phone]

## 2019-08-11 NOTE — Progress Notes (Signed)
Patient c/o aching/stiffness in the right side of her head/neck that she states has been on going for several days now. Patient also states she felt like her heart skipped a beat during the night, however appears to be in NSR upon auscultation. Patient c/o itching related to underwear elastic and requested hydrocortisone cream. Patient is alert and oriented with VS WNL. Patient is independent in the room with no deficits noted. MD made aware of above assessment. Will continue to monitor.

## 2019-08-11 NOTE — Progress Notes (Addendum)
Subjective: Patient examined at bed side this morning. She is no longer having the dizziness but did have increased nausea this morning. Reports right head pain that radiated down to her neck, not different in character from her baseline. Also left sided chest heaviness with lifting arm overhead. Breast exam performed at bedside with no significant findings. Self-reported episode of skipped heart beat overnight. Heart was auscultated by nurse with no findings. EKG with normal sinus rhythm. She also reported rash from an allergic reaction to elastic in underwear. Triamcinolone cream ordered.   Objective: Vital signs in last 24 hours: Vitals:   08/10/19 2050 08/10/19 2105 08/10/19 2320 08/11/19 0820  BP: (!) 115/51  (!) 109/59 122/61  Pulse: 72  70 66  Resp: 20  18 16   Temp: 98.5 F (36.9 C)  97.6 F (36.4 C) 98 F (36.7 C)  TempSrc: Oral  Oral Oral  SpO2: 98%  100% 100%  Weight: 81.6 kg     Height:  5\' 3"  (1.6 m)     Physical exam General: Well-appearing, not in acute distress CV: RRR S1S2 with out murmurs.  Abd: Normoactive bowel sounds. Soft and non tender abdomen. Breast: No tenderness to palpation. Breasts with out mass or skin changes. No blood or cresting of nipples. No axillary or supraclavicular LAD.   Lab Results: CMP Latest Ref Rng & Units 08/11/2019 08/10/2019 08/10/2019  Glucose 70 - 99 mg/dL 123(H) 100(H) 115(H)  BUN 8 - 23 mg/dL 21 33(H) 36(H)  Creatinine 0.44 - 1.00 mg/dL 1.20(H) 1.86(H) 2.33(H)  Sodium 135 - 145 mmol/L 139 137 135  Potassium 3.5 - 5.1 mmol/L 3.5 3.9 3.8  Chloride 98 - 111 mmol/L 107 101 97(L)  CO2 22 - 32 mmol/L 22 23 25   Calcium 8.9 - 10.3 mg/dL 9.3 8.9 9.7  Total Protein 6.5 - 8.1 g/dL 7.3 - 8.0  Total Bilirubin 0.3 - 1.2 mg/dL 1.0 - 1.3(H)  Alkaline Phos 38 - 126 U/L 72 - 70  AST 15 - 41 U/L 19 - 22  ALT 0 - 44 U/L 10 - 12   CBC Latest Ref Rng & Units 08/11/2019 08/10/2019 02/14/2018  WBC 4.0 - 10.5 K/uL 3.1(L) 3.8(L) 3.9  Hemoglobin 12.0 -  15.0 g/dL 13.7 14.4 13.4  Hematocrit 36.0 - 46.0 % 38.8 42.0 39.6  Platelets 150 - 400 K/uL 246 256 293   Medications: Refused heparin this morning.  Scheduled Meds: . heparin  5,000 Units Subcutaneous Q8H  . sodium chloride flush  3 mL Intravenous Once  . triamcinolone 0.1 % cream : eucerin   Topical BID   Assessment/Plan: Active Problems:   AKI (acute kidney injury) ([place])  Eileen Cameron is a 68 year old with pmhx of HTN, HLD, arthritis and neck pain who presented with 2 month hx of dizziness and nausea. Presented 1 week after finding of increased creatinine at her PCP doctor visit. Creatinine initially elevated but down-trended after NS bolus in ED making her AKI likely from prerenal, dehydration etiology.   Stage II AKI: 2 month hx of dizziness and nausea that worsened two days ago. Elevated creatinine from baseline of 0.93 to 2.33 on workup in ED. Creatinine down-trending from 2.33->1.86->1.20 after fluid bolus c/w prerenal AKI. Creatinine stabilizing and can be followed in outpatient setting with PCP.  -- f/u with PCP on 10/08 for repeat CMP  Chest heaviness: 1 day history of positional chest heaviness. No phx and breast exam unremarkable. Likely from MSK etiology. Last mammography in 12/2017 without evidence of malignancy.  --  Outpatient f/u with PCP if symptoms persist.    Incidental leukopenia: CBC with WBC down-trending from 3.8->3.1. Differential with low absolute neutrophils at 1.1. Blood smear ordered. Patient stable and workup can be managed in the outpatient setting with Dr. Heber Alfarata.  -- F/u with PCP for neutropenia with repeat CBC w/ diff in 2-6 weeks  This is a Medical Student Note.  The care of the patient was discussed with Dr. Marianna Payment and the assessment and plan formulated with their assistance.  Please see their attached note for official documentation of the daily encounter.   LOS: 0 days   Evaristo Bury, Medical Student 08/11/2019, 11:52 AM

## 2019-08-11 NOTE — Care Management Obs Status (Signed)
[facility] NOTIFICATION   Patient Details  Name: Eileen Cameron MRN: [id number] Date of Birth: 1951/04/28   Medicare Observation Status Notification Given:  Yes    Carles Collet, RN 08/11/2019, 10:20 AM

## 2019-08-11 NOTE — H&P (Signed)
  Date: 08/11/2019  Patient name: Eileen Cameron  Medical record number: [id number]  Date of birth: [birth year]   I have seen and evaluated Eileen Cameron and discussed their care with the Residency Team.   Eileen Cameron is a 68 year old female with a past medical history of hypertension, hyperlipidemia, and spinal osteoarthritis who presented with dizziness in the setting of AKI and ongoing nausea x2-3 months.  She was sent to the ED by her PCP after labs showed worsening renal function with creatinine of 2.3.  On admission her olmesartan was held and she was started on IV fluids.  Renal function today has improved, with creatinine 1.2.  Her symptoms of dizziness have resolved.  She continues to endorse symptoms of nausea.  She denies abdominal pain, no dysphasia, odynophagia, early satiety.  She reports a 5 pound weight loss but this was intentional in the setting of diet and exercise.  She is overdue for screening colonoscopy and mammogram.  PMHx, Fam Hx, and/or Soc Hx : Per resident note.   Vitals:   08/10/19 2320 08/11/19 0820  BP: (!) 109/59 122/61  Pulse: 70 66  Resp: 18 16  Temp: 97.6 F (36.4 C) 98 F (36.7 C)  SpO2: 100% 100%   Physical Exam Constitutional: NAD, appears comfortable Cardiovascular: RRR, no murmurs, rubs, or gallops.  Pulmonary/Chest: CTAB, no wheezes, rales, or rhonchi.  Abdominal: Soft, non tender, non distended. +BS.  Extremities: Warm and well perfused. No edema.    Assessment and Plan: I have seen and evaluated the patient as outlined above. I agree with the formulated Assessment and Plan as detailed in the residents' note, with the following changes:   1.  AKI: Improved, suspect prerenal in the setting of nausea and poor PO intake. Renal function is almost back to baseline (normal). Recommend holding omlesartan on discahrge with close PCP follow up.   2. Nausea: Unclear reason for nausea. GI ROS otherwise negative. No significant weight loss. Recommend close  PCP follow up. May need GI referral for endoscopy. Encourage PO fluid intake on discharge.   Velna Ochs, MD 10/5/20201:19 PM

## 2019-08-11 NOTE — TOC Initial Note (Signed)
Transition of Care [facility]) - Initial/Assessment Note    Patient Details  Name: Eileen Cameron MRN: [id number] Date of Birth: [birth year]  Transition of Care [facility]) CM/SW Contact:    Carles Collet, RN Phone Number: 08/11/2019, 10:21 AM  Clinical Narrative:                Patient from home, describes herself as independent. She states she ambulates w/o assist, and her daughter provides support as needed.    Expected Discharge Plan: Home/Self Care Barriers to Discharge: Continued Medical Work up   Patient Goals and CMS Choice        Expected Discharge Plan and Services Expected Discharge Plan: Home/Self Care                                              Prior Living Arrangements/Services                       Activities of Daily Living Home Assistive Devices/Equipment: None ADL Screening (condition at time of admission) Patient's cognitive ability adequate to safely complete daily activities?: Yes Is the patient deaf or have difficulty hearing?: No Does the patient have difficulty seeing, even when wearing glasses/contacts?: No Does the patient have difficulty concentrating, remembering, or making decisions?: No Patient able to express need for assistance with ADLs?: Yes Does the patient have difficulty dressing or bathing?: No Independently performs ADLs?: Yes (appropriate for developmental age) Does the patient have difficulty walking or climbing stairs?: No Weakness of Legs: None Weakness of Arms/Hands: None  Permission Sought/Granted                  Emotional Assessment              Admission diagnosis:  Dizziness [R42] AKI (acute kidney injury) ([place]) [N17.9] Patient Active Problem List   Diagnosis Date Noted  . AKI (acute kidney injury) ([place]) 08/10/2019  . Frontal sinus pain 04/23/2019  . Sinus infection 03/27/2019  . Jaw pain 07/04/2018  . Palpitations 02/18/2018  . Visual floaters, bilateral 02/18/2018  . Primary osteoarthritis  of right knee 02/14/2018  . Neck pain, musculoskeletal 12/27/2017  . Vertigo 12/27/2017  . Obesity (BMI 30.0-34.9) 08/09/2017  . Vitamin D insufficiency 08/07/2017  . Back pain 04/05/2017  . Myalgia due to statin 02/08/2017  . Left shoulder pain 02/08/2017  . Post-nasal drip 10/10/2016  . Risk for coronary artery disease between 10% and 20% in next 10 years 08/11/2016  . History of statin therapy 08/10/2016  . Encounter for immunization 07/18/2016  . Right shoulder pain 04/20/2016  . Leg cramps 11/18/2015  . GERD (gastroesophageal reflux disease) 11/28/2014  . Itching 10/16/2014  . Insomnia 09/18/2014  . Seasonal allergies 12/31/2013  . Fatigue due to sleep pattern disturbance 05/13/2013  . Preventative health care 08/26/2012  . Essential hypertension 04/01/2009  . Lumbar back pain with radiculopathy affecting right lower extremity 04/01/2009   PCP:  Lucious Groves, DO Pharmacy:   [facility] [place], [place] - [address] AT [place] [address] Phone: [phone] Fax: [phone]     Social Determinants of Health (SDOH) Interventions    Readmission Risk Interventions No flowsheet data found.

## 2019-08-15 ENCOUNTER — Other Ambulatory Visit: Payer: Self-pay | Admitting: Family Medicine

## 2019-08-15 ENCOUNTER — Ambulatory Visit: Payer: Medicare Other

## 2019-08-15 DIAGNOSIS — Z1231 Encounter for screening mammogram for malignant neoplasm of breast: Secondary | ICD-10-CM

## 2019-08-16 ENCOUNTER — Encounter (HOSPITAL_BASED_OUTPATIENT_CLINIC_OR_DEPARTMENT_OTHER): Payer: Self-pay | Admitting: Emergency Medicine

## 2019-08-16 ENCOUNTER — Emergency Department (HOSPITAL_BASED_OUTPATIENT_CLINIC_OR_DEPARTMENT_OTHER): Payer: Medicare Other

## 2019-08-16 ENCOUNTER — Emergency Department (HOSPITAL_BASED_OUTPATIENT_CLINIC_OR_DEPARTMENT_OTHER)
Admission: EM | Admit: 2019-08-16 | Discharge: 2019-08-16 | Disposition: A | Discharge status: Home / Self Care | Payer: Medicare Other | Attending: Emergency Medicine | Admitting: Emergency Medicine

## 2019-08-16 ENCOUNTER — Other Ambulatory Visit: Payer: Self-pay

## 2019-08-16 DIAGNOSIS — I10 Essential (primary) hypertension: Secondary | ICD-10-CM | POA: Diagnosis not present

## 2019-08-16 DIAGNOSIS — Z79899 Other long term (current) drug therapy: Secondary | ICD-10-CM | POA: Insufficient documentation

## 2019-08-16 DIAGNOSIS — M79604 Pain in right leg: Secondary | ICD-10-CM | POA: Diagnosis not present

## 2019-08-16 NOTE — ED Notes (Signed)
Pt was given a warm blanket

## 2019-08-16 NOTE — ED Triage Notes (Signed)
Patient states that she has had pain to her right leg from her knee down to her foot, for 3 days. She is concerned that she may have a blood  - denies any any chest pain or SOB at this time. The patient states that she is also having a headache to the right side of her head since this am

## 2019-08-16 NOTE — ED Provider Notes (Signed)
[facility] EMERGENCY DEPARTMENT Provider Note   CSN: [id number] Arrival date & time: 08/16/19  1733     History   Chief Complaint Chief Complaint  Patient presents with  . Leg Pain    HPI Eileen Cameron is a 68 y.o. female.     The history is provided by the patient.  Leg Pain Location:  Leg Time since incident:  2 days Leg location:  R lower leg Pain details:    Quality:  Aching   Radiates to:  Does not radiate   Severity:  Mild   Onset quality:  Gradual   Timing:  Intermittent   Progression:  Waxing and waning Chronicity:  New Relieved by:  Nothing Worsened by:  Nothing Associated symptoms: no back pain, no decreased ROM, no fatigue, no fever, no itching, no muscle weakness, no neck pain, no numbness, no stiffness, no swelling and no tingling     Past Medical History:  Diagnosis Date  . Adrenal incidentaloma ([place]) 07/08/2015   1.0 by 1.4 cm lateral limb left adrenal mass on CT abdomen on 07/08/15 Followed up in April 2018, stable no further imaging required.  . Allergy    seasonal allergies, ace inhibitors--angioedema  . Hyperlipidemia   . Hypertension     Patient Active Problem List   Diagnosis Date Noted  . AKI (acute kidney injury) ([place]) 08/10/2019  . Frontal sinus pain 04/23/2019  . Sinus infection 03/27/2019  . Jaw pain 07/04/2018  . Palpitations 02/18/2018  . Visual floaters, bilateral 02/18/2018  . Primary osteoarthritis of right knee 02/14/2018  . Neck pain, musculoskeletal 12/27/2017  . Dizziness 12/27/2017  . Obesity (BMI 30.0-34.9) 08/09/2017  . Vitamin D insufficiency 08/07/2017  . Back pain 04/05/2017  . Myalgia due to statin 02/08/2017  . Left shoulder pain 02/08/2017  . Post-nasal drip 10/10/2016  . Risk for coronary artery disease between 10% and 20% in next 10 years 08/11/2016  . History of statin therapy 08/10/2016  . Encounter for immunization 07/18/2016  . Right shoulder pain 04/20/2016  . Leg cramps 11/18/2015  . GERD  (gastroesophageal reflux disease) 11/28/2014  . Itching 10/16/2014  . Insomnia 09/18/2014  . Seasonal allergies 12/31/2013  . Fatigue due to sleep pattern disturbance 05/13/2013  . Preventative health care 08/26/2012  . Essential hypertension 04/01/2009  . Lumbar back pain with radiculopathy affecting right lower extremity 04/01/2009    Past Surgical History:  Procedure Laterality Date  . COLONOSCOPY N/A 01/08/2013   Procedure: COLONOSCOPY;  Surgeon: Arta Silence, MD;  Location: [facility];  Service: Endoscopy;  Laterality: N/A;  . ECTOPIC PREGNANCY SURGERY  1970s  . IR RADIOLOGY PERIPHERAL GUIDED IV START  07/02/2018  . IR US GUIDE VASC ACCESS RIGHT  07/02/2018  . right breast abscess drainage    . TONSILLECTOMY    . TUBAL LIGATION       OB History   No obstetric history on file.      Home Medications    Prior to Admission medications   Medication Sig Start Date End Date Taking? Authorizing Provider  Cholecalciferol (VITAMIN D3 PO) Take 1 tablet by mouth daily.   Yes [provider]  cyclobenzaprine (FLEXERIL) 5 MG tablet Take 10 mg by mouth 3 (three) times daily as needed for muscle spasms. 07/21/19  Yes [provider]  diclofenac sodium (VOLTAREN) 1 % GEL Apply 4 g topically 4 (four) times daily. 12/26/18  Yes Lucious Groves, DO  meclizine (ANTIVERT) 25 MG tablet Take 1 tablet (  25 mg total) by mouth 3 (three) times daily as needed for dizziness. 03/11/19  Yes Lucious Groves, DO  Omega-3 Fatty Acids (FISH OIL PO) Take 1 capsule by mouth daily.   Yes [provider]  amLODipine (NORVASC) 5 MG tablet  08/13/19   [provider]    Family History Family History  Problem Relation Age of Onset  . Heart disease Mother 87  . Diabetes Mother   . Hypertension Sister   . Heart disease Maternal Grandmother     Social History Social History   Tobacco Use  . Smoking status: Never Smoker  . Smokeless tobacco: Never Used  Substance Use  Topics  . Alcohol use: No    Alcohol/week: 0.0 standard drinks  . Drug use: No     Allergies   Ace inhibitors   Review of Systems Review of Systems  Constitutional: Negative for chills, fatigue and fever.  HENT: Negative for ear pain and sore throat.   Eyes: Negative for pain and visual disturbance.  Respiratory: Negative for cough and shortness of breath.   Cardiovascular: Negative for chest pain and palpitations.  Gastrointestinal: Negative for abdominal pain and vomiting.  Genitourinary: Negative for dysuria and hematuria.  Musculoskeletal: Positive for arthralgias. Negative for back pain, neck pain and stiffness.  Skin: Negative for color change, itching and rash.  Neurological: Negative for seizures and syncope.  All other systems reviewed and are negative.    Physical Exam Updated Vital Signs BP (!) 159/78 (BP Location: Right Arm)   Pulse 73   Temp 98.7 F (37.1 C) (Oral)   Resp 18   Ht 5\' 3"  (1.6 m)   Wt 81.2 kg   SpO2 100%   BMI 31.71 kg/m   Physical Exam Vitals signs and nursing note reviewed.  Constitutional:      General: She is not in acute distress.    Appearance: She is well-developed.  HENT:     Head: Normocephalic and atraumatic.     Nose: Nose normal.     Mouth/Throat:     Mouth: Mucous membranes are moist.  Eyes:     Conjunctiva/sclera: Conjunctivae normal.     Pupils: Pupils are equal, round, and reactive to light.  Neck:     Musculoskeletal: Normal range of motion and neck supple.  Cardiovascular:     Rate and Rhythm: Normal rate and regular rhythm.     Pulses: Normal pulses.     Heart sounds: Normal heart sounds. No murmur.  Pulmonary:     Effort: Pulmonary effort is normal. No respiratory distress.     Breath sounds: Normal breath sounds.  Abdominal:     Palpations: Abdomen is soft.     Tenderness: There is no abdominal tenderness.  Musculoskeletal: Normal range of motion.        General: Tenderness (TTP to right calf) present.   Skin:    General: Skin is warm and dry.     Capillary Refill: Capillary refill takes less than 2 seconds.  Neurological:     General: No focal deficit present.     Mental Status: She is alert and oriented to person, place, and time.     Cranial Nerves: No cranial nerve deficit.     Sensory: No sensory deficit.     Motor: No weakness.     Coordination: Coordination normal.      ED Treatments / Results  Labs (all labs ordered are listed, but only abnormal results are displayed) Labs Reviewed -  No data to display  EKG None  Radiology US Venous Img Lower Unilateral Right  Result Date: 08/16/2019 CLINICAL DATA:  C/o Rt calf pain/swelling x 3 days EXAM: RIGHT LOWER EXTREMITY VENOUS DOPPLER ULTRASOUND TECHNIQUE: Gray-scale sonography with graded compression, as well as color Doppler and duplex ultrasound were performed to evaluate the lower extremity deep venous systems from the level of the common femoral vein and including the common femoral, femoral, profunda femoral, popliteal and calf veins including the posterior tibial, peroneal and gastrocnemius veins when visible. The superficial great saphenous vein was also interrogated. Spectral Doppler was utilized to evaluate flow at rest and with distal augmentation maneuvers in the common femoral, femoral and popliteal veins. COMPARISON:  None. FINDINGS: Contralateral Common Femoral Vein: Respiratory phasicity is normal and symmetric with the symptomatic side. No evidence of thrombus. Normal compressibility. Common Femoral Vein: No evidence of thrombus. Normal compressibility, respiratory phasicity and response to augmentation. Saphenofemoral Junction: No evidence of thrombus. Normal compressibility and flow on color Doppler imaging. Profunda Femoral Vein: No evidence of thrombus. Normal compressibility and flow on color Doppler imaging. Femoral Vein: No evidence of thrombus. Normal compressibility, respiratory phasicity and response to  augmentation. Popliteal Vein: No evidence of thrombus. Normal compressibility, respiratory phasicity and response to augmentation. Calf Veins: No evidence of thrombus. Normal compressibility and flow on color Doppler imaging. Superficial Great Saphenous Vein: No evidence of thrombus. Normal compressibility. Other Findings:  None. IMPRESSION: No evidence of deep venous thrombosis in the right lower extremity. Electronically Signed   By: Audie Pinto M.D.   On: 08/16/2019 19:31    Procedures Procedures (including critical care time)  Medications Ordered in ED Medications - No data to display   Initial Impression / Assessment and Plan / ED Course  I have reviewed the triage vital signs and the nursing notes.  Pertinent labs & imaging results that were available during my care of the patient were reviewed by me and considered in my medical decision making (see chart for details).        Eileen Cameron is a 68 year old female with history of hypertension who presents to the ED with right lower leg pain.  Patient with normal vitals.  No fever.  No back pain.  Pain for the last 2 days on and off.  Has a history of leg cramps.  DVT study was performed that showed no blood clot.  Patient had good pulses in the right lower extremity.  No concern for arterial process.  No history of trauma.  No concern for fracture.  Patient with no signs to suggest cellulitis.  Overall likely muscle spasm.  Recommend Tylenol Motrin.  Given return precautions and discharged from ED in good condition.  This chart was dictated using voice recognition software.  Despite best efforts to proofread,  errors can occur which can change the documentation meaning.    Final Clinical Impressions(s) / ED Diagnoses   Final diagnoses:  Right leg pain    ED Discharge Orders    None       Lennice Sites, DO 08/16/19 2232

## 2019-08-25 ENCOUNTER — Ambulatory Visit (INDEPENDENT_AMBULATORY_CARE_PROVIDER_SITE_OTHER): Payer: Medicare Other | Admitting: Internal Medicine

## 2019-08-25 VITALS — BP 149/86 | HR 97 | Temp 98.2°F | Wt 174.1 lb

## 2019-08-25 DIAGNOSIS — F322 Major depressive disorder, single episode, severe without psychotic features: Secondary | ICD-10-CM

## 2019-08-25 DIAGNOSIS — F339 Major depressive disorder, recurrent, unspecified: Secondary | ICD-10-CM | POA: Diagnosis not present

## 2019-08-25 DIAGNOSIS — D709 Neutropenia, unspecified: Secondary | ICD-10-CM | POA: Diagnosis not present

## 2019-08-25 DIAGNOSIS — F329 Major depressive disorder, single episode, unspecified: Secondary | ICD-10-CM | POA: Insufficient documentation

## 2019-08-25 DIAGNOSIS — I10 Essential (primary) hypertension: Secondary | ICD-10-CM | POA: Diagnosis not present

## 2019-08-25 DIAGNOSIS — Z79899 Other long term (current) drug therapy: Secondary | ICD-10-CM

## 2019-08-25 DIAGNOSIS — M79606 Pain in leg, unspecified: Secondary | ICD-10-CM | POA: Diagnosis not present

## 2019-08-25 DIAGNOSIS — F331 Major depressive disorder, recurrent, moderate: Secondary | ICD-10-CM | POA: Insufficient documentation

## 2019-08-25 LAB — CBC WITH DIFFERENTIAL/PLATELET
Abs Immature Granulocytes: 0.01 10*3/uL (ref 0.00–0.07)
Basophils Absolute: 0 10*3/uL (ref 0.0–0.1)
Basophils Relative: 1 %
Eosinophils Absolute: 0 10*3/uL (ref 0.0–0.5)
Eosinophils Relative: 0 %
HCT: 42.3 % (ref 36.0–46.0)
Hemoglobin: 14 g/dL (ref 12.0–15.0)
Immature Granulocytes: 0 %
Lymphocytes Relative: 34 %
Lymphs Abs: 1.8 10*3/uL (ref 0.7–4.0)
MCH: 30.2 pg (ref 26.0–34.0)
MCHC: 33.1 g/dL (ref 30.0–36.0)
MCV: 91.2 fL (ref 80.0–100.0)
Monocytes Absolute: 0.6 10*3/uL (ref 0.1–1.0)
Monocytes Relative: 12 %
Neutro Abs: 2.7 10*3/uL (ref 1.7–7.7)
Neutrophils Relative %: 53 %
Platelets: 296 10*3/uL (ref 150–400)
RBC: 4.64 MIL/uL (ref 3.87–5.11)
RDW: 12.5 % (ref 11.5–15.5)
WBC: 5.1 10*3/uL (ref 4.0–10.5)
nRBC: 0 % (ref 0.0–0.2)

## 2019-08-25 LAB — BASIC METABOLIC PANEL
Anion gap: 12 (ref 5–15)
BUN: 7 mg/dL — ABNORMAL LOW (ref 8–23)
CO2: 23 mmol/L (ref 22–32)
Calcium: 9.6 mg/dL (ref 8.9–10.3)
Chloride: 102 mmol/L (ref 98–111)
Creatinine, Ser: 0.94 mg/dL (ref 0.44–1.00)
GFR calc Af Amer: >60 mL/min (ref >60)
GFR calc non Af Amer: >60 mL/min (ref >60)
Glucose, Bld: 100 mg/dL — ABNORMAL HIGH (ref 70–99)
Potassium: 3.2 mmol/L — ABNORMAL LOW (ref 3.5–5.1)
Sodium: 137 mmol/L (ref 135–145)

## 2019-08-25 MED ORDER — SERTRALINE HCL 50 MG PO TABS: 50.0000 mg | ORAL_TABLET | Freq: Every day | ORAL | 0 refills | Status: DC

## 2019-08-25 NOTE — Patient Instructions (Signed)
Thank you for allowing [place] to provide your care. Today we are starting you on a medication called sertraline. You'll take this in the morning. Please follow-up with [place] and 6-8 weeks to ensure your mood has improved.  Today we are also checking some blood work. If your kidney function is back to normal we will restart your medication called hydrochlorothiazide. We will then recheck your blood work at her next visit.  For your leg pain it is likely related to your back. If things do not improve in the coming weeks please let us know and we may need to do more imaging.

## 2019-08-25 NOTE — Assessment & Plan Note (Addendum)
Patient recently hospitalized on 10/04. She presented the hospital with dizziness, decreased PO intake, and nausea/vomiting. She was found to have in a KIA with a creatinine elevation to 2.3 from a baseline of approximately 0.9. Her olmesartan and hydrochlorothiazide were discontinued. With IV fluids her creatinine decreased to 1.2 discharge.  She presents today for blood pressure check. She is currently only taking amlodipine 5 mg daily. She denies constipation or lower extremity edema.  A/P: - Check BMP  - Will restart HCTZ 25 mg QD if creatinine, potassium, and sodium are WNL  - Continue Amlodipine 5 mg QD

## 2019-08-25 NOTE — Progress Notes (Signed)
   CC: Hypertension, depression, leg pain  HPI:  Ms.Ammy Rassel is a 68 y.o. female with PMHx listed below presenting for Hypertension, depression, leg pain. Please see the A&P for the status of the patient's chronic medical problems.  Past Medical History:  Diagnosis Date  . Adrenal incidentaloma ([place]) 07/08/2015   1.0 by 1.4 cm lateral limb left adrenal mass on CT abdomen on 07/08/15 Followed up in April 2018, stable no further imaging required.  . Allergy    seasonal allergies, ace inhibitors--angioedema  . Hyperlipidemia   . Hypertension    Review of Systems:  Performed and all others negative.  Physical Exam: Vitals:   08/25/19 1107  BP: (!) 149/86  Pulse: 97  Temp: 98.2 F (36.8 C)  TempSrc: Oral  SpO2: 98%  Weight: 174 lb 1.6 oz (79 kg)   General: Well nourished female in no acute distress Pulm: Good air movement with no wheezing or crackles  CV: RRR, no murmurs, no rubs   Assessment & Plan:   See Encounters Tab for problem based charting.  Patient discussed with Dr. Heber Rosedale

## 2019-08-25 NOTE — Assessment & Plan Note (Signed)
During patient's hospitalization he was found to have neutropenia. Further workup was not pursued. We will get repeat CBC with diff and peripheral smear.

## 2019-08-26 ENCOUNTER — Telehealth: Payer: Self-pay | Admitting: Internal Medicine

## 2019-08-26 DIAGNOSIS — I10 Essential (primary) hypertension: Secondary | ICD-10-CM

## 2019-08-26 MED ORDER — OLMESARTAN MEDOXOMIL 20 MG PO TABS: 20.0000 mg | ORAL_TABLET | Freq: Every day | ORAL | 1 refills | Status: DC

## 2019-08-26 NOTE — Telephone Encounter (Signed)
Attempted to call patient to discuss her lab results. No answer. If she calls back please tell her the following:  Her kidney function has returned to normal. Here potassium was slightly low so we are going to restart a low dose Olmesartan. She will need to come back in in 4 weeks for repeat labs.   Ina Homes, MD

## 2019-08-26 NOTE — Assessment & Plan Note (Signed)
Patient presents with depressive symptoms. PHQ-9 = 19. She endorses change in sleep, interest, appetite, energy, and concentration. She has never been diagnosed with depression in the past. She has never been on any medications. We will start sertraline 50 mg daily.

## 2019-08-27 ENCOUNTER — Telehealth: Payer: Self-pay

## 2019-08-27 NOTE — Telephone Encounter (Signed)
Pt called / informed to continue Amlodipine.

## 2019-08-27 NOTE — Telephone Encounter (Signed)
Please let Larita know that olmesartan is safe for the kidneys and has protective benefit to the kidneys, it was only held temporarily to allow for extra blood pressure and blood flow to the kidneys.  It is fine for her to resume taking the olmesartan as her kidney function has returned to normal.  If she has additional concerns let me know and I will give her a call.

## 2019-08-27 NOTE — Telephone Encounter (Signed)
Pt called and informed of Dr Jodene Nam response - ok to take Olmesartan; she verbalized understanding. She wants to know if it's ok to take Amlodipine also? Thanks

## 2019-08-27 NOTE — Progress Notes (Signed)
Internal Medicine Clinic Attending  Case discussed with Dr. Helberg at the time of the visit.  We reviewed the resident's history and exam and pertinent patient test results.  I agree with the assessment, diagnosis, and plan of care documented in the resident's note.    

## 2019-08-27 NOTE — Telephone Encounter (Signed)
Yes please continue to take amlodipine

## 2019-08-27 NOTE — Telephone Encounter (Signed)
Question about bp med, please call pt back.

## 2019-08-27 NOTE — Telephone Encounter (Signed)
Return pt's call - stated she's taking Amlodipine 5mg  and now taking Olmesartan; she has read the side effects and she went to ER for AKI. She wants to be sure Olmesartan will not affect her kidneys and ok to take. Stated she is drinking plenty of water now. Per Dr Jerrell Mylar note - it was discontinued but was refilled today. Thanks

## 2019-08-28 ENCOUNTER — Ambulatory Visit (INDEPENDENT_AMBULATORY_CARE_PROVIDER_SITE_OTHER): Payer: Medicare Other | Admitting: Radiation Oncology

## 2019-08-28 ENCOUNTER — Ambulatory Visit (HOSPITAL_COMMUNITY)
Admission: RE | Admit: 2019-08-28 | Discharge: 2019-08-28 | Disposition: A | Discharge status: Home / Self Care | Payer: Medicare Other | Source: Ambulatory Visit | Attending: Internal Medicine | Admitting: Internal Medicine

## 2019-08-28 ENCOUNTER — Telehealth: Payer: Self-pay | Admitting: *Deleted

## 2019-08-28 ENCOUNTER — Telehealth: Payer: Self-pay

## 2019-08-28 ENCOUNTER — Other Ambulatory Visit: Payer: Self-pay

## 2019-08-28 ENCOUNTER — Other Ambulatory Visit: Payer: Self-pay | Admitting: Radiation Oncology

## 2019-08-28 ENCOUNTER — Encounter: Payer: Self-pay | Admitting: Radiation Oncology

## 2019-08-28 VITALS — BP 129/72 | HR 83 | Temp 98.3°F | Ht 63.0 in | Wt 173.5 lb

## 2019-08-28 DIAGNOSIS — R531 Weakness: Secondary | ICD-10-CM

## 2019-08-28 DIAGNOSIS — R002 Palpitations: Secondary | ICD-10-CM | POA: Diagnosis not present

## 2019-08-28 DIAGNOSIS — R251 Tremor, unspecified: Secondary | ICD-10-CM

## 2019-08-28 DIAGNOSIS — Z6830 Body mass index (BMI) 30.0-30.9, adult: Secondary | ICD-10-CM

## 2019-08-28 DIAGNOSIS — R519 Headache, unspecified: Secondary | ICD-10-CM

## 2019-08-28 DIAGNOSIS — Z79899 Other long term (current) drug therapy: Secondary | ICD-10-CM

## 2019-08-28 DIAGNOSIS — Z0000 Encounter for general adult medical examination without abnormal findings: Secondary | ICD-10-CM

## 2019-08-28 DIAGNOSIS — I10 Essential (primary) hypertension: Secondary | ICD-10-CM

## 2019-08-28 DIAGNOSIS — R634 Abnormal weight loss: Secondary | ICD-10-CM

## 2019-08-28 DIAGNOSIS — Z1231 Encounter for screening mammogram for malignant neoplasm of breast: Secondary | ICD-10-CM

## 2019-08-28 NOTE — Telephone Encounter (Signed)
Pt states she is having "spells" of shaky, weak, "gas in chest", pain , sharp in back of head. Denies short of breath, denies chest pain. Acc 10/22 at 1515

## 2019-08-28 NOTE — Patient Instructions (Signed)
Thank you for coming to your appointment. It was so nice to see you. Today we discussed  Eileen Cameron was seen today for follow-up and gas.  Diagnoses and all orders for this visit:  Episodes of fatigue and weakness -     EKG 12-Lead -     Rhythm ECG, report -     Orthostatic vital signs -     Holter monitor - 48 hour; Future -     Metanephrines, plasma  If labs were drawn today, I will call you with any abnormal results.  Sincerely,  Al Decant, MD

## 2019-08-28 NOTE — Telephone Encounter (Signed)
Requesting to speak with a nurse about feeling tired and no energy. Please call pt back.

## 2019-08-28 NOTE — Telephone Encounter (Signed)
New Message:     Regino Schultze from [facility]  Cneter called. She wanted you to know she is putting in an order for this patient. She just waanted you to look out for the order, this was her first time ordering a Monitor.;

## 2019-08-28 NOTE — Assessment & Plan Note (Signed)
-  had flu shot at the pharmacy

## 2019-08-28 NOTE — Telephone Encounter (Signed)
Agree with plan to be seen

## 2019-08-28 NOTE — Progress Notes (Signed)
   CC: weakness  HPI:  Ms.Berdina Bergsma is a 68 y.o. F here with weakness. Please see assessment and plan for full HPI.  Past Medical History:  Diagnosis Date  . Adrenal incidentaloma ([place]) 07/08/2015   1.0 by 1.4 cm lateral limb left adrenal mass on CT abdomen on 07/08/15 Followed up in April 2018, stable no further imaging required.  . Allergy    seasonal allergies, ace inhibitors--angioedema  . Hyperlipidemia   . Hypertension    Review of Systems:    Review of Systems  Constitutional: Positive for malaise/fatigue and weight loss (12 lbs). Negative for chills, diaphoresis and fever.       Flushing  Cardiovascular: Positive for palpitations.  Gastrointestinal: Negative for blood in stool, constipation, diarrhea, nausea and vomiting.  Neurological: Positive for tremors and headaches.   Physical Exam:  Vitals:   08/28/19 1503  BP: 129/72  Pulse: 83  Temp: 98.3 F (36.8 C)  TempSrc: Oral  SpO2: 100%  Weight: 173 lb 8 oz (78.7 kg)  Height: 5\' 3"  (1.6 m)   Orthostatic VS for the past 24 hrs:  BP- Lying Pulse- Lying BP- Sitting Pulse- Sitting BP- Standing at 0 minutes Pulse- Standing at 0 minutes  08/28/19 1538 131/67 75 135/77 79 119/77 83   Physical Exam  Constitutional: She is oriented to person, place, and time and well-developed, well-nourished, and in no distress. No distress.  HENT:  Head: Normocephalic and atraumatic.  Neck: Normal range of motion.  Cardiovascular: Normal rate, regular rhythm and normal heart sounds.  No murmur heard. Pulmonary/Chest: Effort normal and breath sounds normal. No respiratory distress.  Abdominal: Soft. Bowel sounds are normal. She exhibits no distension.  Musculoskeletal: Normal range of motion.        General: No edema.  Neurological: She is alert and oriented to person, place, and time.  Skin: Skin is warm and dry.  Psychiatric: Affect normal.   Assessment & Plan:   See Encounters Tab for problem based charting.  Patient  seen with Dr. Daryll Drown

## 2019-08-28 NOTE — Assessment & Plan Note (Addendum)
-  pt with hx of hypertension on amlodipine 5 mg and olmesartan 20 mg with adequate BP control today -here with complaint of >1 mo of every other day episodes of "feeling really bad;" she feels weak, drained of energy and has to to go lie down; she does endorse some palpitations during these episodes; denies dizziness, nausea, vomiting -EKG and orthostatics in the office were unremarkable -continue current medications for now

## 2019-08-28 NOTE — Assessment & Plan Note (Addendum)
-  pt presents with qod episodes of weakness, fatigue, palpitations, shakiness and headache  -denies dizziness, nausea or vomiting -when these episodes occur she goes to lay down -patient also reports 12 lb weight loss in last year  -patient has a history of an adrenal incidentaloma last imaged in April 2018 with stable findings, no further imaging indicated -could be pheochromocytoma or anxiety given hx of incidentaloma and MDD -on exam patient has RRR, normal orthostatic vitals -EKG unremarkable -ordered holter monitor and plasma metanephrines

## 2019-08-28 NOTE — Telephone Encounter (Signed)
3 day ZIO XT long term holter monitor to be mailed to the patients home.  Instructions reviewed briefly as they are included in the monitor kit.  Patient is aware she needs to wear the monitor at least 48 hours.

## 2019-08-29 LAB — PATHOLOGIST SMEAR REVIEW

## 2019-09-03 ENCOUNTER — Ambulatory Visit (INDEPENDENT_AMBULATORY_CARE_PROVIDER_SITE_OTHER): Payer: Medicare Other

## 2019-09-03 DIAGNOSIS — R531 Weakness: Secondary | ICD-10-CM | POA: Diagnosis not present

## 2019-09-03 DIAGNOSIS — R002 Palpitations: Secondary | ICD-10-CM

## 2019-09-03 NOTE — Telephone Encounter (Signed)
Call from pt - stated she has not received the heart monitor. Informed pt to call cardiologist's office.

## 2019-09-03 NOTE — Telephone Encounter (Signed)
Monitor delivered [facility] 09/03/2019 at mailbox 11:36AM.  Patient confirmed.

## 2019-09-04 LAB — METANEPHRINES, PLASMA
Metanephrine, Free: 58.2 pg/mL (ref 0.0–88.0)
Normetanephrine, Free: 260.7 pg/mL — ABNORMAL HIGH (ref 0.0–191.8)

## 2019-09-04 NOTE — Progress Notes (Signed)
Internal Medicine Clinic Attending  I saw and evaluated the patient.  I personally confirmed the key portions of the history and exam documented by Dr. Lanier and I reviewed pertinent patient test results.  The assessment, diagnosis, and plan were formulated together and I agree with the documentation in the resident's note.   

## 2019-09-19 ENCOUNTER — Telehealth: Payer: Self-pay

## 2019-09-19 MED ORDER — AMLODIPINE BESYLATE 5 MG PO TABS: 5.0000 mg | ORAL_TABLET | Freq: Every day | ORAL | 3 refills | Status: DC

## 2019-09-19 NOTE — Telephone Encounter (Signed)
I would like her to be on only 5mg  of amlodipine, she should continue benicar 20mg  (I believe she should have plenty of that Rx)

## 2019-09-19 NOTE — Telephone Encounter (Signed)
olmesartan (BENICAR) 20 MG tablet  amLODipine (NORVASC) 5 MG tablet, per patient this need to be 10 mg instead 5mg . Refill request @  [facility] [id number], [place] [phone] (Phone) [phone] (Fax)

## 2019-09-22 ENCOUNTER — Telehealth: Payer: Self-pay | Admitting: *Deleted

## 2019-09-22 NOTE — Telephone Encounter (Signed)
Delivered message to pt

## 2019-09-22 NOTE — Telephone Encounter (Signed)
Yes that is fine to use

## 2019-09-22 NOTE — Telephone Encounter (Signed)
Confirmed doses of olmesartan and amlodipine.  Pt also ask if she can use 180mg  of allegra OTC?

## 2019-09-23 ENCOUNTER — Other Ambulatory Visit: Payer: Self-pay | Admitting: Radiation Oncology

## 2019-09-23 DIAGNOSIS — R531 Weakness: Secondary | ICD-10-CM

## 2019-09-23 DIAGNOSIS — R002 Palpitations: Secondary | ICD-10-CM

## 2019-09-26 ENCOUNTER — Ambulatory Visit (INDEPENDENT_AMBULATORY_CARE_PROVIDER_SITE_OTHER): Payer: Medicare Other | Admitting: Internal Medicine

## 2019-09-26 ENCOUNTER — Other Ambulatory Visit: Payer: Self-pay

## 2019-09-26 VITALS — BP 143/73 | HR 81 | Temp 98.9°F | Ht 63.0 in | Wt 169.8 lb

## 2019-09-26 DIAGNOSIS — G8929 Other chronic pain: Secondary | ICD-10-CM

## 2019-09-26 DIAGNOSIS — M542 Cervicalgia: Secondary | ICD-10-CM

## 2019-09-26 DIAGNOSIS — F419 Anxiety disorder, unspecified: Secondary | ICD-10-CM

## 2019-09-26 DIAGNOSIS — I10 Essential (primary) hypertension: Secondary | ICD-10-CM

## 2019-09-26 DIAGNOSIS — R232 Flushing: Secondary | ICD-10-CM | POA: Diagnosis not present

## 2019-09-26 DIAGNOSIS — E785 Hyperlipidemia, unspecified: Secondary | ICD-10-CM

## 2019-09-26 DIAGNOSIS — Z79899 Other long term (current) drug therapy: Secondary | ICD-10-CM

## 2019-09-26 NOTE — Patient Instructions (Addendum)
Dear Eileen Cameron,  Thank you for allowing [place] to provide your care today. Today we discussed your hot flashes    I have ordered bmp, crp, tsh labs for you. I will call if any are abnormal.    Today we made no changes to your medications:    Please follow-up if your symptoms worsen.    Should you have any questions or concerns please call the internal medicine clinic at [phone].    Thank you for choosing [facility].  Sertraline tablets What is this medicine? SERTRALINE (SER tra leen) is used to treat depression. It may also be used to treat obsessive compulsive disorder, panic disorder, post-trauma stress, premenstrual dysphoric disorder (PMDD) or social anxiety. This medicine may be used for other purposes; ask your health care provider or pharmacist if you have questions. COMMON BRAND NAME(S): Zoloft What should I tell my health care provider before I take this medicine? They need to know if you have any of these conditions:  bleeding disorders  bipolar disorder or a family history of bipolar disorder  glaucoma  heart disease  high blood pressure  history of irregular heartbeat  history of low levels of calcium, magnesium, or potassium in the blood  if you often drink alcohol  liver disease  receiving electroconvulsive therapy  seizures  suicidal thoughts, plans, or attempt; a previous suicide attempt by you or a family member  take medicines that treat or prevent blood clots  thyroid disease  an unusual or allergic reaction to sertraline, other medicines, foods, dyes, or preservatives  pregnant or trying to get pregnant  breast-feeding How should I use this medicine? Take this medicine by mouth with a glass of water. Follow the directions on the prescription label. You can take it with or without food. Take your medicine at regular intervals. Do not take your medicine more often than directed. Do not stop taking this medicine suddenly except upon  the advice of your doctor. Stopping this medicine too quickly may cause serious side effects or your condition may worsen. A special MedGuide will be given to you by the pharmacist with each prescription and refill. Be sure to read this information carefully each time. Talk to your pediatrician regarding the use of this medicine in children. While this drug may be prescribed for children as young as 7 years for selected conditions, precautions do apply. Overdosage: If you think you have taken too much of this medicine contact a poison control center or emergency room at once. NOTE: This medicine is only for you. Do not share this medicine with others. What if I miss a dose? If you miss a dose, take it as soon as you can. If it is almost time for your next dose, take only that dose. Do not take double or extra doses. What may interact with this medicine? Do not take this medicine with any of the following medications:  cisapride  dronedarone  linezolid  MAOIs like Carbex, Eldepryl, Marplan, Nardil, and Parnate  methylene blue (injected into a vein)  pimozide  thioridazine This medicine may also interact with the following medications:  alcohol  amphetamines  aspirin and aspirin-like medicines  certain medicines for depression, anxiety, or psychotic disturbances  certain medicines for fungal infections like ketoconazole, fluconazole, posaconazole, and itraconazole  certain medicines for irregular heart beat like flecainide, quinidine, propafenone  certain medicines for migraine headaches like almotriptan, eletriptan, frovatriptan, naratriptan, rizatriptan, sumatriptan, zolmitriptan  certain medicines for sleep  certain medicines for seizures like  carbamazepine, valproic acid, phenytoin  certain medicines that treat or prevent blood clots like warfarin, enoxaparin, dalteparin  cimetidine  digoxin  diuretics  fentanyl  isoniazid  lithium  NSAIDs, medicines for  pain and inflammation, like ibuprofen or naproxen  other medicines that prolong the QT interval (cause an abnormal heart rhythm) like dofetilide  rasagiline  safinamide  supplements like St. John's wort, kava kava, valerian  tolbutamide  tramadol  tryptophan This list may not describe all possible interactions. Give your health care provider a list of all the medicines, herbs, non-prescription drugs, or dietary supplements you use. Also tell them if you smoke, drink alcohol, or use illegal drugs. Some items may interact with your medicine. What should I watch for while using this medicine? Tell your doctor if your symptoms do not get better or if they get worse. Visit your doctor or health care professional for regular checks on your progress. Because it may take several weeks to see the full effects of this medicine, it is important to continue your treatment as prescribed by your doctor. Patients and their families should watch out for new or worsening thoughts of suicide or depression. Also watch out for sudden changes in feelings such as feeling anxious, agitated, panicky, irritable, hostile, aggressive, impulsive, severely restless, overly excited and hyperactive, or not being able to sleep. If this happens, especially at the beginning of treatment or after a change in dose, call your health care professional. Dennis Bast may get drowsy or dizzy. Do not drive, use machinery, or do anything that needs mental alertness until you know how this medicine affects you. Do not stand or sit up quickly, especially if you are an older patient. This reduces the risk of dizzy or fainting spells. Alcohol may interfere with the effect of this medicine. Avoid alcoholic drinks. Your mouth may get dry. Chewing sugarless gum or sucking hard candy, and drinking plenty of water may help. Contact your doctor if the problem does not go away or is severe. What side effects may I notice from receiving this medicine? Side  effects that you should report to your doctor or health care professional as soon as possible:  allergic reactions like skin rash, itching or hives, swelling of the face, lips, or tongue  anxious  black, tarry stools  changes in vision  confusion  elevated mood, decreased need for sleep, racing thoughts, impulsive behavior  eye pain  fast, irregular heartbeat  feeling faint or lightheaded, falls  feeling agitated, angry, or irritable  hallucination, loss of contact with reality  loss of balance or coordination  loss of memory  painful or prolonged erections  restlessness, pacing, inability to keep still  seizures  stiff muscles  suicidal thoughts or other mood changes  trouble sleeping  unusual bleeding or bruising  unusually weak or tired  vomiting Side effects that usually do not require medical attention (report to your doctor or health care professional if they continue or are bothersome):  change in appetite or weight  change in sex drive or performance  diarrhea  increased sweating  indigestion, nausea  tremors This list may not describe all possible side effects. Call your doctor for medical advice about side effects. You may report side effects to FDA at 1-800-FDA-1088. Where should I keep my medicine? Keep out of the reach of children. Store at room temperature between 15 and 30 degrees C (59 and 86 degrees F). Throw away any unused medicine after the expiration date. NOTE: This sheet is a  summary. It may not cover all possible information. If you have questions about this medicine, talk to your doctor, pharmacist, or health care provider.  2020 Elsevier/Gold Standard (2018-10-15 10:09:27)

## 2019-09-26 NOTE — Progress Notes (Signed)
CC: Hot flashes  HPI: [name] is a 68 y.o. F w/ PMH of adrenal incidentaloma, HLD, HTN and anxiety who presents w/ complaints of hot flashes. She states that she has had hot flashes since her late 61s, early 70s and her symptoms were fairly well controlled until 2 months ago when she appeared to have gradual worsening hot flashes. She mentions that at day time she is fine but only at night time does she experience significant 'warmness and sweats.' She denies any recorded fevers or chills, denies nausea, vomiting, diarrhea, constipation. Endorsing some neck pain which is chronic. She mentions she should be up to date on her cancer-screening and she has no family history of cancer.  Past Medical History:  Diagnosis Date  . Adrenal incidentaloma ([place]) 07/08/2015   1.0 by 1.4 cm lateral limb left adrenal mass on CT abdomen on 07/08/15 Followed up in April 2018, stable no further imaging required.  . Allergy    seasonal allergies, ace inhibitors--angioedema  . Hyperlipidemia   . Hypertension     Review of Systems: Review of Systems  Constitutional: Positive for diaphoresis. Negative for chills, fever and malaise/fatigue.  Eyes: Negative for blurred vision.  Respiratory: Negative for shortness of breath.   Cardiovascular: Negative for chest pain, palpitations and leg swelling.  Gastrointestinal: Negative for diarrhea, nausea and vomiting.  Musculoskeletal: Positive for neck pain. Negative for back pain, joint pain and myalgias.  Skin: Negative for rash.  Neurological: Negative for dizziness and headaches.     Physical Exam: Vitals:   09/26/19 0943  BP: (!) 143/73  Pulse: 81  Temp: 98.9 F (37.2 C)  TempSrc: Oral  SpO2: 100%  Weight: 169 lb 12.8 oz (77 kg)  Height: 5\' 3"  (1.6 m)    Physical Exam  Constitutional: She is oriented to person, place, and time. She appears well-developed and well-nourished. No distress.  HENT:  Mouth/Throat: Oropharynx is clear and moist.   Eyes: Conjunctivae are normal.  Neck: Normal range of motion. Neck supple.  Cardiovascular: Normal rate, regular rhythm, normal heart sounds and intact distal pulses.  No murmur heard. Respiratory: Effort normal and breath sounds normal. She has no wheezes. She has no rales.  GI: Soft. Bowel sounds are normal. She exhibits no distension. There is no abdominal tenderness.  Musculoskeletal: Normal range of motion.        General: No edema.  Lymphadenopathy:    She has no cervical adenopathy.  Neurological: She is alert and oriented to person, place, and time.  Skin: Skin is warm and dry. No rash noted.    Assessment & Plan:   Hot flashes Presents w/ complaints of hot flashes. Mentions chronic duration of >30 years. Gradual worsening in the last 2 months. Mentions no obvious triggers, including dietary and medications. Physical exam w/o obvious lymphadenopathy or thyroid nodules. Previously worked up with cardiac monitoring (infrequent PVCs) and metanephrines (free metanephrine WNL, slightly elevated normetanephrines) w/o significant findings. No hx of thyroid disease. Prior chest imaging w/o lymphadenopathy or evidence of TB. HIV screen negative 1 month prior. Colon cancer screening negative. Mammogram scheduled for 10/06/19. Possible differential of chronic vasomotor vs panic attack vs medication induced vs carcinoid vs chronic infection vs malignancy.  - TSH - CRP, if positive, will f/u with imaging. - Asked to keep diary of symptoms and measure temp at home as well. Possibly due to sertraline and/or amlodipine - F/u mammogram   Essential hypertension BP Readings from Last 3 Encounters:  09/26/19 (!) 143/73  08/28/19 129/72  08/25/19 (!) 149/86   Used to be on olmesartan-hctz 40-25mg . Now on amlodipine and olmesartan. BP above goal but meeting new provider.   - BMP today - Hold off on amlodipine dose adjustment with complaints of 'nights weats' as calcium channel blockers can cause  flushing due to vasodilation - Can increase olmesartan if renal fx okay    Patient discussed with Dr. [employer]   -Rebeca Alert, [facility] Pager: [phone]

## 2019-09-26 NOTE — Assessment & Plan Note (Signed)
BP Readings from Last 3 Encounters:  09/26/19 (!) 143/73  08/28/19 129/72  08/25/19 (!) 149/86   Used to be on olmesartan-hctz 40-25mg . Now on amlodipine and olmesartan. BP above goal but meeting new provider.   - BMP today - Hold off on amlodipine dose adjustment with complaints of 'nights weats' as calcium channel blockers can cause flushing due to vasodilation - Can increase olmesartan if renal fx okay

## 2019-09-26 NOTE — Assessment & Plan Note (Addendum)
Presents w/ complaints of hot flashes. Mentions chronic duration of >30 years. Gradual worsening in the last 2 months. Mentions no obvious triggers, including dietary and medications. Physical exam w/o obvious lymphadenopathy or thyroid nodules. Previously worked up with cardiac monitoring (infrequent PVCs) and metanephrines (free metanephrine WNL, slightly elevated normetanephrines) w/o significant findings. No hx of thyroid disease. Prior chest imaging w/o lymphadenopathy or evidence of TB. HIV screen negative 1 month prior. Colon cancer screening negative. Mammogram scheduled for 10/06/19. Possible differential of chronic vasomotor vs panic attack vs medication induced vs carcinoid vs chronic infection vs malignancy.  - TSH - CRP, if positive, will f/u with imaging. - Asked to keep diary of symptoms and measure temp at home as well. Possibly due to sertraline and/or amlodipine - F/u mammogram

## 2019-09-27 LAB — BMP8+ANION GAP
Anion Gap: 17 mmol/L (ref 10.0–18.0)
BUN/Creatinine Ratio: 8 — ABNORMAL LOW (ref 12–28)
BUN: 7 mg/dL — ABNORMAL LOW (ref 8–27)
CO2: 23 mmol/L (ref 20–29)
Calcium: 9.7 mg/dL (ref 8.7–10.3)
Chloride: 101 mmol/L (ref 96–106)
Creatinine, Ser: 0.84 mg/dL (ref 0.57–1.00)
GFR calc Af Amer: 81 mL/min/{1.73_m2} (ref >59)
GFR calc non Af Amer: 71 mL/min/{1.73_m2} (ref >59)
Glucose: 88 mg/dL (ref 65–99)
Potassium: 4.1 mmol/L (ref 3.5–5.2)
Sodium: 141 mmol/L (ref 134–144)

## 2019-09-27 LAB — TSH: TSH: 0.533 u[IU]/mL (ref 0.450–4.500)

## 2019-09-27 LAB — C-REACTIVE PROTEIN: CRP: 3 mg/L (ref 0–10)

## 2019-09-29 ENCOUNTER — Telehealth: Payer: Self-pay | Admitting: Internal Medicine

## 2019-09-29 DIAGNOSIS — R42 Dizziness and giddiness: Secondary | ICD-10-CM

## 2019-09-29 MED ORDER — MECLIZINE HCL 25 MG PO TABS: 25.0000 mg | ORAL_TABLET | Freq: Three times a day (TID) | ORAL | 0 refills | Status: DC | PRN

## 2019-09-29 NOTE — Telephone Encounter (Signed)
Spoke with Ms.Nilsson on the phone and discussed lab results including WNL TSH and negative CRP. She mentions that she is continuing to endorse hot flashes. She mentions that she is not taking her sertraline as prescribed as she was afraid of the 'side effects that she read about on the internet.' Discussed importance of medication adherence and advised to start sertraline to assess for efficacy. Ms.Wagley expressed understanding.

## 2019-09-29 NOTE — Assessment & Plan Note (Signed)
Pt requires refills on medications with associated diagnosis above.  Reviewed disease process and find this medication to be necessary, will not change dose or alter current therapy. 

## 2019-09-29 NOTE — Progress Notes (Signed)
Internal Medicine Clinic Attending  Case discussed with Dr. Lee at the time of the visit.  We reviewed the resident's history and exam and pertinent patient test results.  I agree with the assessment, diagnosis, and plan of care documented in the resident's note.  Alexander Raines, M.D., Ph.D.  

## 2019-09-30 ENCOUNTER — Telehealth: Payer: Self-pay | Admitting: Internal Medicine

## 2019-09-30 DIAGNOSIS — I10 Essential (primary) hypertension: Secondary | ICD-10-CM

## 2019-09-30 MED ORDER — AMLODIPINE BESYLATE 5 MG PO TABS: 10.0000 mg | ORAL_TABLET | Freq: Every day | ORAL | 3 refills | Status: DC

## 2019-09-30 NOTE — Telephone Encounter (Signed)
Dr Truman Hayward, could you please call pt and discuss lab results and meds?

## 2019-09-30 NOTE — Telephone Encounter (Signed)
Called and spoke with Eileen Cameron. She mentions that she tried stopping her amlodipine due to possibility of her night sweats being caused by flushing from calcium channel blocker as discussed in her recent visit with me. She states hot flashes continued despite cessation of amlodipine and is wondering if she should resume her medication. She also is wondering if she should be taking OTC probiotics at home. Advised Eileen Cameron to resume her amlodipine and reassured her that she does not have contraindication to taking probiotics although the degree of benefit is likely minimal in setting of lack of IBS or infectious diarrhea. Eileen Cameron expressed understanding.

## 2019-09-30 NOTE — Telephone Encounter (Signed)
Refill Request- Patient requesting a call back about which medication she needs to stop taking for a few days and what she needs to start taking of the medications listed below:  amLODipine (NORVASC) 5 MG tablet   olmesartan (BENICAR) 20 MG tablet  [id number] - [place], [place] - [address] AT [address]

## 2019-10-06 ENCOUNTER — Ambulatory Visit
Admission: RE | Admit: 2019-10-06 | Discharge: 2019-10-06 | Disposition: A | Discharge status: Home / Self Care | Payer: Medicare Other | Source: Ambulatory Visit | Attending: Family | Admitting: Family

## 2019-10-06 ENCOUNTER — Other Ambulatory Visit: Payer: Self-pay

## 2019-10-06 ENCOUNTER — Other Ambulatory Visit: Payer: Self-pay | Admitting: Internal Medicine

## 2019-10-06 DIAGNOSIS — Z1231 Encounter for screening mammogram for malignant neoplasm of breast: Secondary | ICD-10-CM

## 2019-10-10 ENCOUNTER — Telehealth: Payer: Self-pay | Admitting: Internal Medicine

## 2019-10-10 NOTE — Telephone Encounter (Signed)
Pt reports that since restarting her amlodipine she has a little cough, it went away when she was not taking it and now has started again. Please advise

## 2019-10-10 NOTE — Telephone Encounter (Signed)
Pls contact pt she is having coughing spell when taking her bp pills [phone]

## 2019-10-10 NOTE — Telephone Encounter (Signed)
Called patient, discussed I do not think it is the amlodipine, probably post nasal drip, advised to use her flonase and call back next week if not improved.

## 2019-10-14 ENCOUNTER — Telehealth: Payer: Self-pay | Admitting: Internal Medicine

## 2019-10-14 MED ORDER — ESOMEPRAZOLE MAGNESIUM 20 MG PO CPDR: 20.0000 mg | DELAYED_RELEASE_CAPSULE | Freq: Every day | ORAL | 1 refills | Status: DC

## 2019-10-14 MED ORDER — FLUTICASONE PROPIONATE 50 MCG/ACT NA SUSP: 2.0000 | Freq: Every day | NASAL | 0 refills | Status: DC

## 2019-10-14 NOTE — Telephone Encounter (Signed)
Needs refill on flonase, acid reflux medicine  ;pt contact [phone]   [facility], [address] - [place]

## 2019-10-14 NOTE — Telephone Encounter (Signed)
Refills of Nexium and flonase sent

## 2019-10-14 NOTE — Telephone Encounter (Signed)
Cannot currently find refills, have been past meds

## 2019-11-03 ENCOUNTER — Telehealth: Payer: Self-pay | Admitting: Internal Medicine

## 2019-11-03 NOTE — Telephone Encounter (Signed)
Pls contact pt [phone]; pt has a question regarding taking some vitamins

## 2019-11-03 NOTE — Telephone Encounter (Signed)
RTC, pt has a hx of hot flashes.  She has bought OTC "menopause relief" and is asking if she can take this for the hot flashes.  This RN asked pt what active ingredients were listed in OTC medication and pt states:  Black Cohosh Magnoila Bark Ginkgo biloba  Will forward to PCP to advise. Thank you, SChaplin, RN,BSN

## 2019-11-04 NOTE — Telephone Encounter (Signed)
Please let her know that the OTC supplement will be fine to try, it should not interact with any of her medications, if it is not effective then she should make an appointment and we can go over some of the prescription medications that could help.

## 2019-11-04 NOTE — Telephone Encounter (Signed)
Pt called / informed "the OTC supplement will be fine to try, it should not interact with any of her medications, if it is not effective then she should make an appointment and we can go over some of the prescription medications that could help." per Dr Heber . Stated ok and will call back to schedule an appt after trying OTC supplement.

## 2019-11-10 ENCOUNTER — Other Ambulatory Visit: Payer: Self-pay | Admitting: *Deleted

## 2019-11-10 MED ORDER — FLUTICASONE PROPIONATE 50 MCG/ACT NA SUSP: 2.0000 | Freq: Every day | NASAL | 3 refills | Status: DC

## 2019-11-16 IMAGING — CT CT HEAD W/O CM
4 series · 16 of 47 positions shown, 18 images · non-contrast
Comparison: December 18, 2016

CLINICAL DATA: Intermittent dizziness for 3 weeks.

EXAM:
CT HEAD WITHOUT CONTRAST
TECHNIQUE: Contiguous axial images were obtained from the base of the skull
through the vertex without intravenous contrast.

[Series 3: head wo · axial · 0.43mm/px · z∈[-46,+58]mm · 7 of 29 slices shown, 9 images]
[im 4/29  brain]
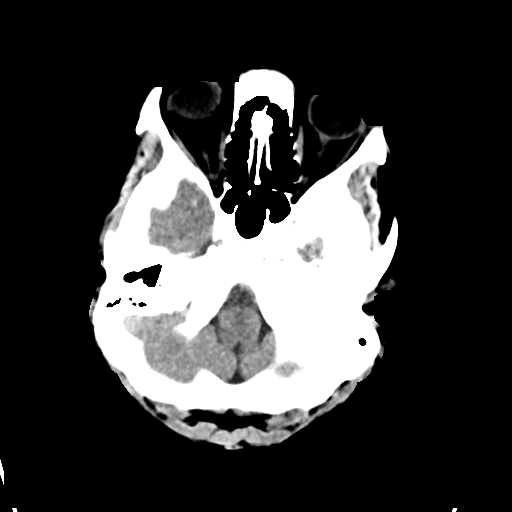
[im 4/29  bone]
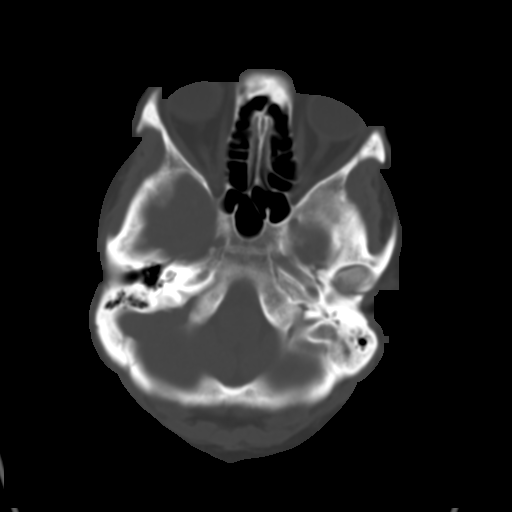
[im 8/29  brain]
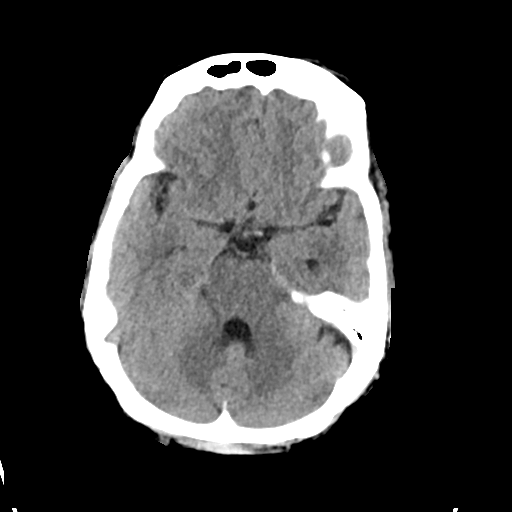
[im 11/29  brain]
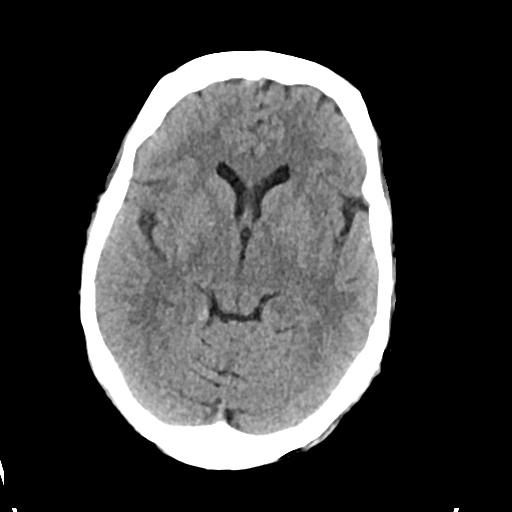
[im 15/29  brain]
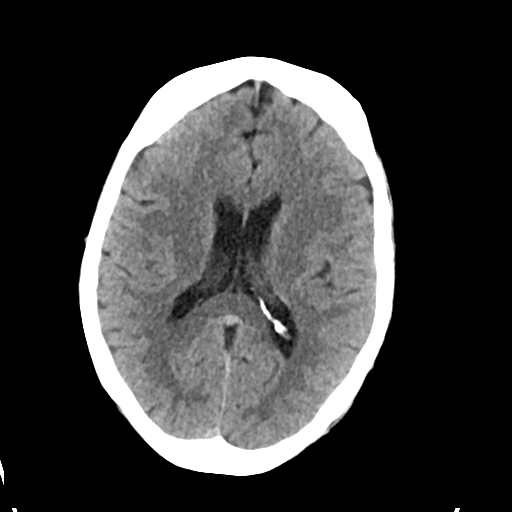
[im 18/29  brain]
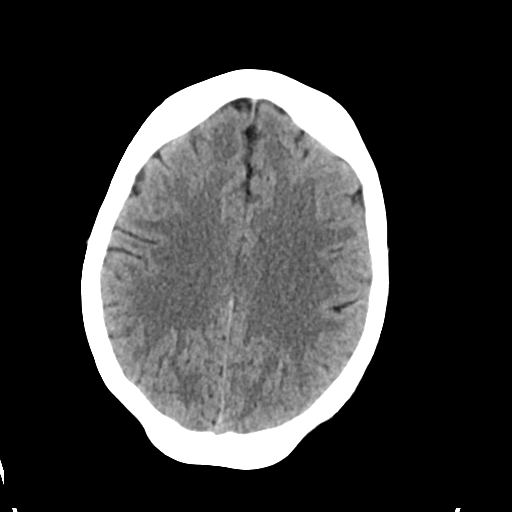
[im 18/29  bone]
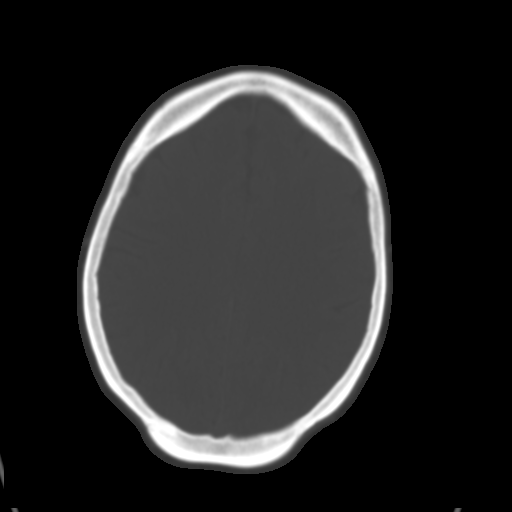
[im 22/29  brain]
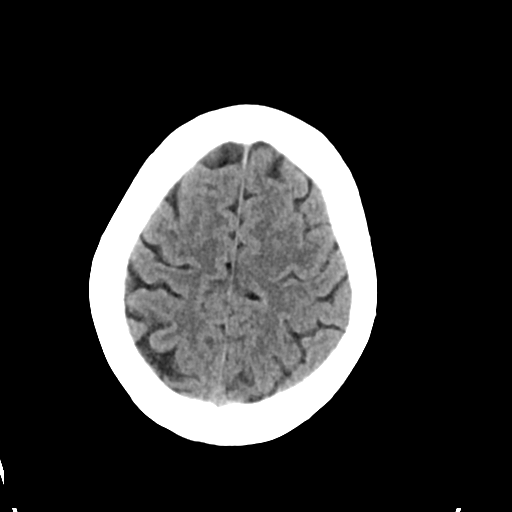
[im 25/29  brain]
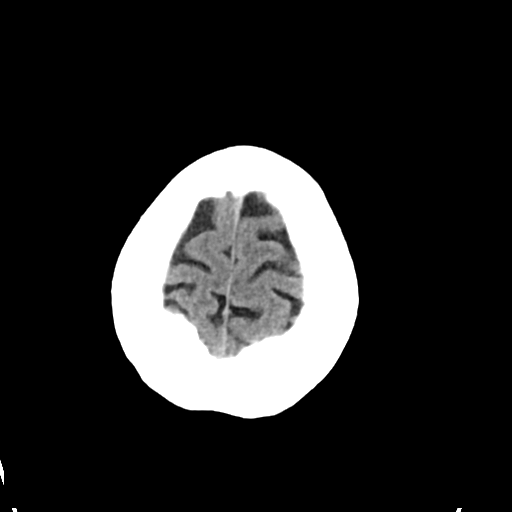

[Series 4: head bone · axial · 0.43mm/px · z∈[-48,-20]mm · 3 of 73 slices shown]
[im 8/73  bone]
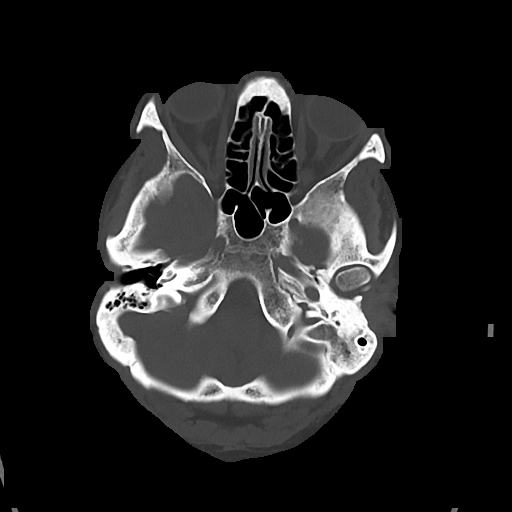
[im 15/73  bone]
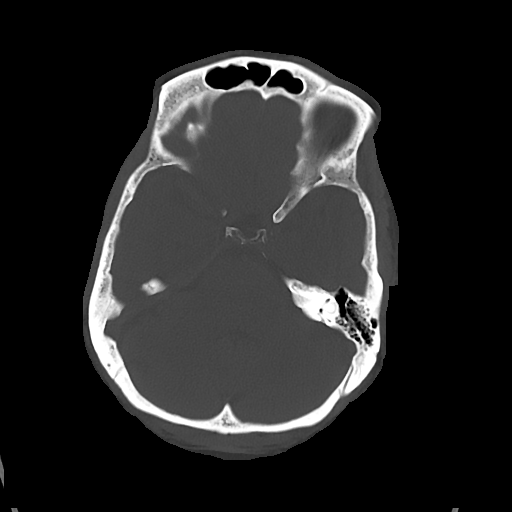
[im 22/73  bone]
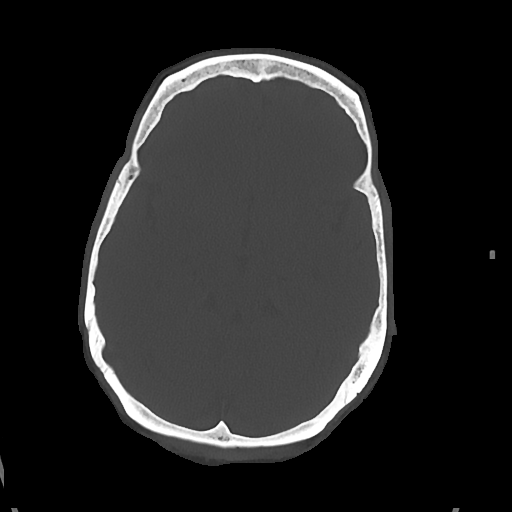

[Series 5: cor soft · coronal · 0.31mm/px · 3 of 62 slices shown]
[im 21/62  brain]
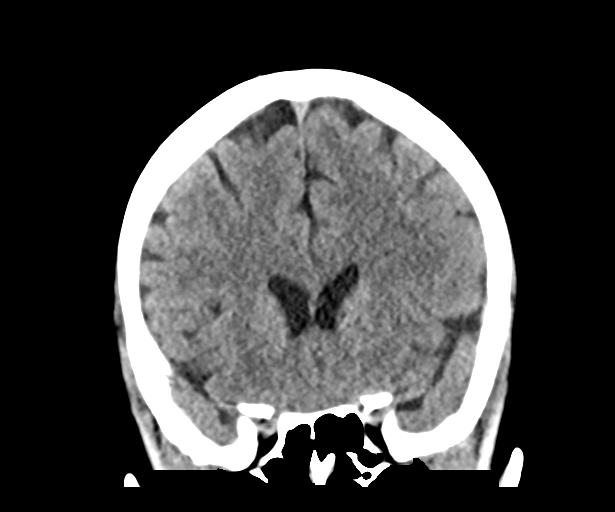
[im 28/62  brain]
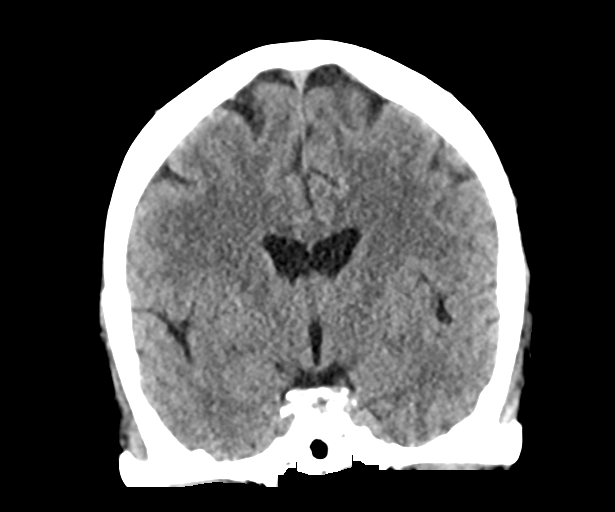
[im 34/62  brain]
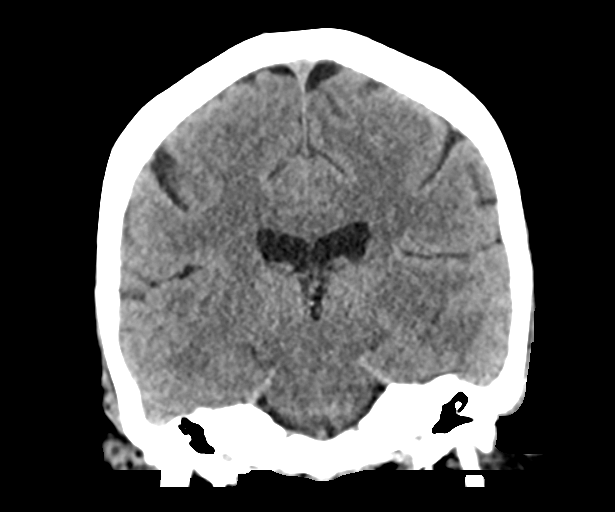

[Series 6: sag soft · sagittal · 0.31mm/px · 3 of 49 slices shown]
[im 17/49  brain]
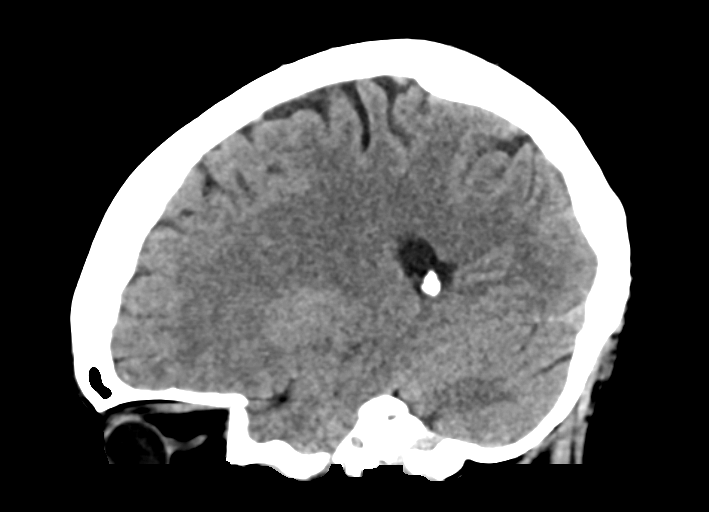
[im 25/49  brain]
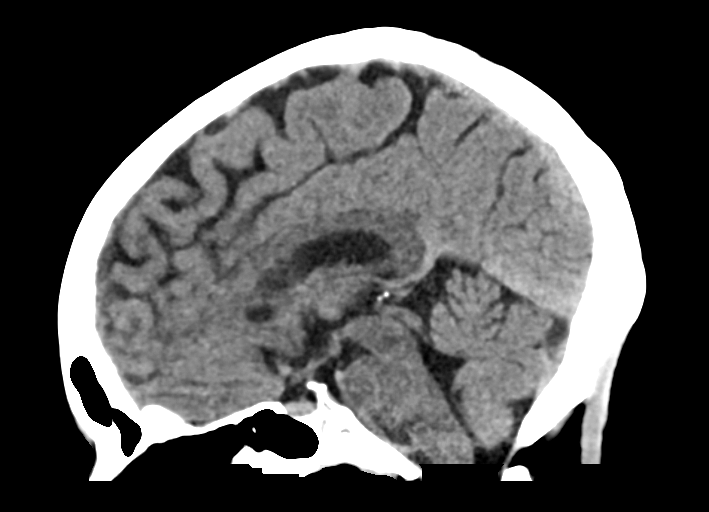
[im 33/49  brain]
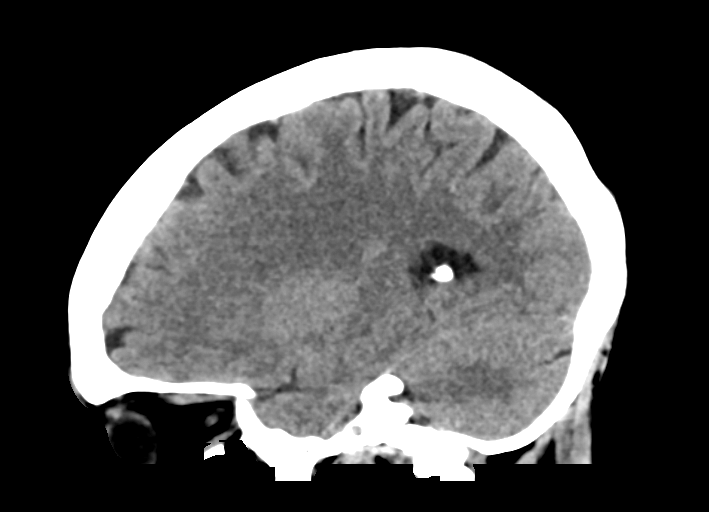

[16 of 47 positions shown; findings below may reference images not displayed]

FINDINGS: Brain: No evidence of acute infarction, hemorrhage, hydrocephalus,
extra-axial collection or mass lesion/mass effect.

Vascular: No hyperdense vessel or unexpected calcification.

Skull: Normal. Negative for fracture or focal lesion.

Sinuses/Orbits: No acute finding.

Other: None.
IMPRESSION: Normal study.  No cause for dizziness identified.

## 2019-12-08 ENCOUNTER — Other Ambulatory Visit: Payer: Self-pay

## 2019-12-08 MED ORDER — ESOMEPRAZOLE MAGNESIUM 20 MG PO CPDR: 20.0000 mg | DELAYED_RELEASE_CAPSULE | Freq: Every day | ORAL | 1 refills | Status: DC

## 2019-12-08 NOTE — Telephone Encounter (Signed)
Next appt scheduled 12/11/19 with PCP. 

## 2019-12-11 ENCOUNTER — Other Ambulatory Visit: Payer: Self-pay

## 2019-12-11 ENCOUNTER — Encounter: Payer: Self-pay | Admitting: Internal Medicine

## 2019-12-11 ENCOUNTER — Ambulatory Visit (INDEPENDENT_AMBULATORY_CARE_PROVIDER_SITE_OTHER): Payer: Medicare Other | Admitting: Internal Medicine

## 2019-12-11 DIAGNOSIS — Z79899 Other long term (current) drug therapy: Secondary | ICD-10-CM

## 2019-12-11 DIAGNOSIS — N951 Menopausal and female climacteric states: Secondary | ICD-10-CM

## 2019-12-11 DIAGNOSIS — I10 Essential (primary) hypertension: Secondary | ICD-10-CM

## 2019-12-11 DIAGNOSIS — R232 Flushing: Secondary | ICD-10-CM

## 2019-12-11 MED ORDER — AMLODIPINE-OLMESARTAN 10-40 MG PO TABS: 1.0000 | ORAL_TABLET | Freq: Every day | ORAL | 3 refills | Status: DC

## 2019-12-11 MED ORDER — PAROXETINE HCL 10 MG PO TABS: 10.0000 mg | ORAL_TABLET | Freq: Every day | ORAL | 0 refills | Status: DC

## 2019-12-11 NOTE — Patient Instructions (Signed)
I have combined the amlodipine and the olmesartan into a combination pill.  I am going to start you on paroxetine 10mg  daily for hot flashes.   COVID-19 Vaccine Information can be found at: [email or web address].[email or web address] For questions related to vaccine distribution or appointments, please email [email or web address] or call [phone].

## 2019-12-15 NOTE — Assessment & Plan Note (Signed)
HPI: She has been taking amlodipine 10 mg daily along with olmesartan 20 mg daily.  Denies any side effects from these medications overall tolerating well.  Assessment essential hypertension above goal  Plan Amlodipine-olmesartan 10-40 mg daily Follow-up in 3 months.

## 2019-12-15 NOTE — Assessment & Plan Note (Signed)
HPI: She was seen back in November for hot flashes that have been ongoing for decades.  Patient believes these to be postmenopausal.  As noted by Dr. Truman Hayward various screenings have been negative repeat TSH and CRP are normal.  Since the last visit she has tried some over-the-counter supplements which have had no effect on her hot flashes.  She is interested in trying other medication to see if it will help her hot flashes.  Assessment postmenopausal hot flashes  Plan Start paroxetine 10 mg daily  Of note sertraline was on her medication list this is prescribed for depression.  She notes filling the medication but never taken it because she was too scared of the possible side effects I went through the side effects of Paxil in detail and discussed that we will monitor for the side effects and also to see if she has any benefit and to see if the benefits outweigh the risk of the medication.  I discussed that she cannot take any sertraline with the paroxetine.

## 2019-12-15 NOTE — Progress Notes (Signed)
  Subjective:  HPI: [name] is a 69 y.o. female who presents for f/u Hot flashes, HTN  Please see Assessment and Plan below for the status of her chronic medical problems.  Review of Systems: Review of Systems  Constitutional: Negative for chills, fever, malaise/fatigue and weight loss.  Respiratory: Negative for cough and shortness of breath.   Cardiovascular: Negative for chest pain and leg swelling.  Skin: Negative for rash.    Objective:  Physical Exam: Vitals:   12/11/19 0953  BP: (!) 151/74  Pulse: 88  Temp: 98.6 F (37 C)  TempSrc: Oral  SpO2: 100%  Weight: 167 lb 1.6 oz (75.8 kg)  Height: 5\' 3"  (1.6 m)   Body mass index is 29.6 kg/m. Physical Exam Vitals and nursing note reviewed.  Constitutional:      Appearance: Normal appearance. She is obese.  Cardiovascular:     Rate and Rhythm: Normal rate and regular rhythm.  Pulmonary:     Effort: Pulmonary effort is normal.     Breath sounds: Normal breath sounds.  Abdominal:     General: Abdomen is flat.  Musculoskeletal:     Right lower leg: No edema.     Left lower leg: No edema.  Neurological:     Mental Status: She is alert.    Assessment & Plan:  See Encounters Tab for problem based charting.  Medications Ordered Meds ordered this encounter  Medications  . PARoxetine (PAXIL) 10 MG tablet    Sig: Take 1 tablet (10 mg total) by mouth daily.    Dispense:  90 tablet    Refill:  0  . amLODipine-olmesartan (AZOR) 10-40 MG tablet    Sig: Take 1 tablet by mouth daily.    Dispense:  90 tablet    Refill:  3   Other Orders No orders of the defined types were placed in this encounter.  Follow Up: Return in about 3 months (around 03/09/2020).

## 2019-12-17 ENCOUNTER — Telehealth: Payer: Self-pay | Admitting: Internal Medicine

## 2019-12-17 NOTE — Telephone Encounter (Signed)
Pt wants to know if she can take covid vaccine; pt contact pt [phone]

## 2019-12-17 NOTE — Telephone Encounter (Signed)
The patient called to inquire whether she should receive a Covid vaccination.  The patient screened negative for a history of severe allergic reaction to any other vaccine or injectable therapy.  The patient was informed that she is eligible for the vaccine based on published contraindications and precautions.  The patient was encouraged to sign up for a vaccination appointment when she becomes eligible.  Pt verbalizes understanding. SChaplin, RN,BSN

## 2019-12-17 NOTE — Telephone Encounter (Signed)
Great, thank you!

## 2020-01-29 ENCOUNTER — Ambulatory Visit (INDEPENDENT_AMBULATORY_CARE_PROVIDER_SITE_OTHER): Payer: Medicare Other | Admitting: Internal Medicine

## 2020-01-29 ENCOUNTER — Other Ambulatory Visit: Payer: Self-pay

## 2020-01-29 ENCOUNTER — Encounter: Payer: Self-pay | Admitting: Internal Medicine

## 2020-01-29 VITALS — BP 146/78 | HR 82 | Temp 98.3°F | Ht 63.0 in | Wt 168.4 lb

## 2020-01-29 DIAGNOSIS — I10 Essential (primary) hypertension: Secondary | ICD-10-CM | POA: Diagnosis not present

## 2020-01-29 DIAGNOSIS — R42 Dizziness and giddiness: Secondary | ICD-10-CM | POA: Diagnosis not present

## 2020-01-29 DIAGNOSIS — Z77120 Contact with and (suspected) exposure to mold (toxic): Secondary | ICD-10-CM

## 2020-01-29 DIAGNOSIS — Z79899 Other long term (current) drug therapy: Secondary | ICD-10-CM

## 2020-01-29 HISTORY — DX: Contact with and (suspected) exposure to mold (toxic): Z77.120

## 2020-01-29 MED ORDER — MECLIZINE HCL 25 MG PO TABS: 25.0000 mg | ORAL_TABLET | Freq: Three times a day (TID) | ORAL | 0 refills | Status: DC | PRN

## 2020-01-29 NOTE — Assessment & Plan Note (Signed)
Vertigo/labrenthitis: Patient reports having "sick spells" since she was in her 71s.  She relates this to her vertigo, but says the room does not always spin when she has "sick spells". Patient said her sick spells usually come on about mid morning, but nothing particular she can recall which triggers these spells.  CT head without contrast was normal in 2019 and normal neuro exam makes structural cause of her dizziness unlikely. The sick spells have become more frequent in the last year.  She is not having any symptoms during our exam and her neuro exam was normal.  On review of the chart she has been treated for vertigo with meclizine ,  but leading diagnosis was labyrinthitis or possible giant cell arthritis.  Labs were ordered by ENT but it appears they were never collected. Likely not GCA given time course. Patient can not describe all symptom, but sounds like a component of tension headaches. Describes pain around head ( band like description)  and in neck . Could be secondary to stress and poor posture. This might be separate issue from her mostly resolved labyrinthitis. She has not followed up with ENT , Dr.Wolicki, since [id number].  With continued episodes periodically of vertigo, she ask for refill of Meclizine.   Plan: Meclizine 25 mg TID PRN Vestibular PT

## 2020-01-29 NOTE — Patient Instructions (Addendum)
Thank you for trusting me with your care. To recap, today we discussed the following:  Sick feeling/vertigo I refilled your meclizine I put a referral in for vestibular physical therapy  High blood pressure Your blood pressure was 146/78 today which is elevated.  I recommend you starting the medication Dr. Heber [place] prescribed at your last visit.  Mold in you home Reassured that you are afebrile and breathing well today. If you start to fill sick then please come to clinic to be evaluated. As we discussed I recommend getting all of the mold out of your home because this can aggravated your allergies.   My best,  Tamsen Snider, MD

## 2020-01-29 NOTE — Progress Notes (Signed)
   CC: sick spells   HPI:[name] is a 69 y.o. female who presents for evaluation of  Please see individual problem based A/P for details.   Past Medical History:  Diagnosis Date  . Adrenal incidentaloma ([place]) 07/08/2015   1.0 by 1.4 cm lateral limb left adrenal mass on CT abdomen on 07/08/15 Followed up in April 2018, stable no further imaging required.  . Allergy    seasonal allergies, ace inhibitors--angioedema  . Hyperlipidemia   . Hypertension    Review of Systems:  ROS negative except as per HPI.  Physical Exam: Vitals:   01/29/20 0900  BP: (!) 146/78  Pulse: 82  Temp: 98.3 F (36.8 C)  TempSrc: Oral  SpO2: 100%  Weight: 168 lb 6.4 oz (76.4 kg)  Height: 5\' 3"  (1.6 m)    General: NAD, nl appearance HE: Normocephalic, atraumatic , EOMI, Conjunctivae normal ENT: No congestion, no rhinorrhea, no exudate or erythema  Cardiovascular: Normal rate, regular rhythm.  No murmurs, rubs, or gallops Pulmonary : Effort normal, breath sounds normal. No wheezes, rales, or rhonchi Abdominal: soft, nontender,  bowel sounds present Musculoskeletal: no swelling , deformity, injury ,or tenderness in extremities, Skin: Warm, dry , no bruising, erythema, or rash Mental Status: Patient is awake, alert, oriented x3 No signs of aphasia or neglect Cranial Nerves: II: Pupils equal, round, and reactive to light.   III,IV, VI: EOMI without ptosis or diploplia.  V: Facial sensation is symmetric to light touch . VII: Facial movement is symmetric.  VIII: hearing is intact to voice X: Uvula elevates symmetrically XI: Shoulder shrug is symmetric. XII: tongue is midline without atrophy or fasciculations.  Motor: normal effort thorughout, at Least 5/5 bilateral UE, 5/5 bilateral lower extremitiy  Sensory: Sensation is grossly intact to light touch bilateral UEs & LE Psychiatric/Behavioral:  normal mood, anxious affect    Assessment & Plan:   See Encounters Tab for problem based  charting.  Patient discussed with Dr. Philipp Ovens

## 2020-01-29 NOTE — Assessment & Plan Note (Signed)
Patient reports mold in her home for 4 years. Some removal has taken place, but she is worried about exposure. No concerning symptoms on exam. Recommended discussing with landlord and insuring her home is safe.

## 2020-01-29 NOTE — Assessment & Plan Note (Signed)
Patient has not started taking Olmesartan-hydrochlorothiazide, because she wanted to finish her old medications. BP 146/78 today. After counseling patient she will begin medication prescribed tomorrow.   Plan: Continue on olmesartan -hydrochlorothiazide 40-25 once daily

## 2020-01-30 NOTE — Progress Notes (Signed)
Internal Medicine Clinic Attending ° °Case discussed with Dr. Steen  at the time of the visit.  We reviewed the resident’s history and exam and pertinent patient test results.  I agree with the assessment, diagnosis, and plan of care documented in the resident’s note.  °

## 2020-02-03 ENCOUNTER — Other Ambulatory Visit: Payer: Self-pay | Admitting: *Deleted

## 2020-02-03 NOTE — Telephone Encounter (Signed)
Called pharm and clarified that request has been discontinued

## 2020-02-13 ENCOUNTER — Other Ambulatory Visit: Payer: Self-pay

## 2020-02-13 MED ORDER — ESOMEPRAZOLE MAGNESIUM 20 MG PO CPDR: 20.0000 mg | DELAYED_RELEASE_CAPSULE | Freq: Every day | ORAL | 1 refills | Status: DC

## 2020-02-24 ENCOUNTER — Ambulatory Visit (INDEPENDENT_AMBULATORY_CARE_PROVIDER_SITE_OTHER): Payer: Medicare Other | Admitting: Internal Medicine

## 2020-02-24 ENCOUNTER — Encounter: Payer: Self-pay | Admitting: Internal Medicine

## 2020-02-24 ENCOUNTER — Other Ambulatory Visit: Payer: Self-pay

## 2020-02-24 VITALS — BP 153/78 | HR 74 | Temp 98.4°F | Wt 172.8 lb

## 2020-02-24 DIAGNOSIS — R252 Cramp and spasm: Secondary | ICD-10-CM

## 2020-02-24 DIAGNOSIS — E559 Vitamin D deficiency, unspecified: Secondary | ICD-10-CM | POA: Diagnosis not present

## 2020-02-24 LAB — D-DIMER, QUANTITATIVE: D-Dimer, Quant: 0.39 ug/mL-FEU (ref 0.00–0.50)

## 2020-02-24 NOTE — Patient Instructions (Signed)
Ms. Lamond,  It was nice meeting you today! You were seen in the office for pain in your right lower leg. We will check some blood work today to look at your electrolytes, vitamin D level, and possibility for a blood clot. I will call you with these results (likely tomorrow morning). It is possible you may need an ultrasound of the right leg if the blood work is abnormal.  Please follow-up with Dr. Heber [place] next month and bring your medications to that visit.   Thank you for letting me be a part of your care!

## 2020-02-24 NOTE — Progress Notes (Signed)
   CC: leg pain  HPI:  [name] is a 69 y.o. F with significant PMH as outlined below, who presents for 2 week history of leg pain. Please see problem-based charting for additional information.   Past Medical History:  Diagnosis Date  . Adrenal incidentaloma ([place]) 07/08/2015   1.0 by 1.4 cm lateral limb left adrenal mass on CT abdomen on 07/08/15 Followed up in April 2018, stable no further imaging required.  . Allergy    seasonal allergies, ace inhibitors--angioedema  . Hyperlipidemia   . Hypertension    Review of Systems:   Review of Systems  Constitutional: Negative for chills and fever.  Respiratory: Negative for shortness of breath.   Cardiovascular: Negative for chest pain and leg swelling.  Musculoskeletal: Positive for myalgias (RLE). Negative for back pain, falls and joint pain.  Skin: Negative.   Neurological: Negative for sensory change and weakness.   Physical Exam:  Vitals:   02/24/20 0836  BP: (!) 153/78  Pulse: 74  Temp: 98.4 F (36.9 C)  TempSrc: Oral  SpO2: 99%  Weight: 172 lb 12.8 oz (78.4 kg)   Physical Exam Constitutional:      General: She is not in acute distress.    Appearance: Normal appearance. She is not ill-appearing.  Cardiovascular:     Rate and Rhythm: Normal rate and regular rhythm.     Heart sounds: Normal heart sounds.  Pulmonary:     Effort: Pulmonary effort is normal.     Breath sounds: Normal breath sounds.  Musculoskeletal:        General: Tenderness (R calf and posterior knee) present. No deformity.     Right lower leg: No edema.     Left lower leg: No edema.     Comments: LE's symmetrical in size, no LE edema bilaterally. Negative Homan's sign bilaterally.  Skin:    General: Skin is warm and dry.     Comments: No overlying skin changes to either leg.  Neurological:     General: No focal deficit present.     Mental Status: She is alert.    Assessment & Plan:   See Encounters Tab for problem based  charting.  Patient discussed with Dr. Heber

## 2020-02-25 LAB — CMP14 + ANION GAP
ALT: 7 IU/L (ref 0–32)
AST: 19 IU/L (ref 0–40)
Albumin/Globulin Ratio: 1.7 (ref 1.2–2.2)
Albumin: 5 g/dL — ABNORMAL HIGH (ref 3.7–4.7)
Alkaline Phosphatase: 118 IU/L — ABNORMAL HIGH (ref 39–117)
Anion Gap: 20 mmol/L — ABNORMAL HIGH (ref 10.0–18.0)
BUN/Creatinine Ratio: 17 (ref 12–28)
BUN: 14 mg/dL (ref 8–27)
Bilirubin Total: 0.4 mg/dL (ref 0.0–1.2)
CO2: 20 mmol/L (ref 20–29)
Calcium: 10.2 mg/dL (ref 8.7–10.3)
Chloride: 101 mmol/L (ref 96–106)
Creatinine, Ser: 0.81 mg/dL (ref 0.57–1.00)
GFR calc Af Amer: 85 mL/min/{1.73_m2} (ref >59)
GFR calc non Af Amer: 73 mL/min/{1.73_m2} (ref >59)
Globulin, Total: 2.9 g/dL (ref 1.5–4.5)
Glucose: 99 mg/dL (ref 65–99)
Potassium: 4.8 mmol/L (ref 3.5–5.2)
Sodium: 141 mmol/L (ref 134–144)
Total Protein: 7.9 g/dL (ref 6.0–8.5)

## 2020-02-25 LAB — VITAMIN D 25 HYDROXY (VIT D DEFICIENCY, FRACTURES): Vit D, 25-Hydroxy: 30.7 ng/mL (ref 30.0–100.0)

## 2020-02-25 NOTE — Assessment & Plan Note (Addendum)
Pt presents today for evaluation of right lower extremity pain. Pt endorses history of intermittent bilateral leg cramping (R > L), and states she began to have a flare of R sided leg pain/cramping two weeks ago. Pain started at the lateral aspect of R lower leg and travels up around to the posterior calf and knee. She has tried tylenol, heat/ice, and epsom salt baths at home which have not improved her symptoms. Denies history of recent surgery or immobilization with long car/plane rides. Pt is able to ambulate without assistance. Her pain worsens with walking and improves with rest, though states it can be triggered by simply laying in bed at night. POC ABI's in 12/2018 (L 1.25 and R 1.26) and 08/2019 (L 1.16 and R 1.18) both normal bilaterally. On examination, pt has tenderness to palpation along posterior R calf and knee. No swelling or overlying skin changes. LE's symmetric. Negative Homan's sign bilaterally.  Assessment - acute flare of intermittent R sided leg cramps  Low suspicion for DVT given legs are symmetrical in size, no RLE edema, and no overlying skin changes on the R. Well's score of 1 for tenderness along venous system demonstrates moderate risk for DVT. Symptoms to not appear consistent with vascular claudication and prior ABIs reassuring.  - will screen with D-dimer - check CMP and Vit D as other potential reversible causes for leg cramps - reassured pt this is likely an acute flare of her LE cramps and should self-resolve - pt has PCP follow-up in 2 weeks  ADDENDUM - D-dimer normal and reassuring at 0.39. Will defer additional testing with LE doppler. CMP unremarkable, see below. Vit D remains low end of normal at 30.7. Notified pt of results over the telephone on 4/21. States she may only take her vitamin D supplementation 2-3 times per week. Instructed her to take this daily and add the pill to her daily pill pack if that helps her remember.   CMP     Component Value Date/Time    NA 141 02/24/2020 0938   K 4.8 02/24/2020 0938   CL 101 02/24/2020 0938   CO2 20 02/24/2020 0938   GLUCOSE 99 02/24/2020 0938   BUN 14 02/24/2020 0938   CREATININE 0.81 02/24/2020 0938   CALCIUM 10.2 02/24/2020 0938   PROT 7.9 02/24/2020 0938   ALBUMIN 5.0 (H) 02/24/2020 0938   AST 19 02/24/2020 0938   ALT 7 02/24/2020 0938   ALKPHOS 118 (H) 02/24/2020 0938   BILITOT 0.4 02/24/2020 0938   GFRNONAA 73 02/24/2020 0938   GFRAA 85 02/24/2020 [id number]

## 2020-02-25 NOTE — Progress Notes (Signed)
Internal Medicine Clinic Attending  Case discussed with Dr. Jones at the time of the visit.  We reviewed the resident's history and exam and pertinent patient test results.  I agree with the assessment, diagnosis, and plan of care documented in the resident's note.  

## 2020-03-04 ENCOUNTER — Other Ambulatory Visit: Payer: Self-pay | Admitting: *Deleted

## 2020-03-04 MED ORDER — FLUTICASONE PROPIONATE 50 MCG/ACT NA SUSP: 2.0000 | Freq: Every day | NASAL | 11 refills | Status: DC

## 2020-03-04 NOTE — Telephone Encounter (Signed)
Next appt scheduled 5/6 with PCP.

## 2020-03-11 ENCOUNTER — Other Ambulatory Visit: Payer: Self-pay

## 2020-03-11 ENCOUNTER — Encounter: Payer: Self-pay | Admitting: Internal Medicine

## 2020-03-11 ENCOUNTER — Ambulatory Visit (INDEPENDENT_AMBULATORY_CARE_PROVIDER_SITE_OTHER): Payer: Medicare Other | Admitting: Internal Medicine

## 2020-03-11 VITALS — BP 132/71 | HR 80 | Temp 98.2°F | Ht 63.0 in | Wt 173.9 lb

## 2020-03-11 DIAGNOSIS — R232 Flushing: Secondary | ICD-10-CM

## 2020-03-11 DIAGNOSIS — I10 Essential (primary) hypertension: Secondary | ICD-10-CM | POA: Diagnosis not present

## 2020-03-11 DIAGNOSIS — M1711 Unilateral primary osteoarthritis, right knee: Secondary | ICD-10-CM | POA: Diagnosis not present

## 2020-03-11 DIAGNOSIS — Z79899 Other long term (current) drug therapy: Secondary | ICD-10-CM

## 2020-03-11 DIAGNOSIS — R252 Cramp and spasm: Secondary | ICD-10-CM | POA: Diagnosis not present

## 2020-03-11 MED ORDER — DICLOFENAC SODIUM 1 % EX GEL: 4.0000 g | Freq: Four times a day (QID) | CUTANEOUS | 3 refills | Status: AC

## 2020-03-11 MED ORDER — AMLODIPINE-OLMESARTAN 5-40 MG PO TABS: 1.0000 | ORAL_TABLET | Freq: Every day | ORAL | 3 refills | Status: DC

## 2020-03-15 NOTE — Assessment & Plan Note (Signed)
HPI: She still occasionally has some hot flashes she has not started the paroxetine she stills feels too nervous about the side effects listed on the medication insert.   Assessment hot flashes  Plan I reassured her that the side effects were not dangerous and that she should try the medication to see if she has any side effects and we can adjust things if we need to. Start Paxil 10 mg daily

## 2020-03-15 NOTE — Assessment & Plan Note (Signed)
Requesting refill of Voltaren gel this does help with knee pain.

## 2020-03-15 NOTE — Progress Notes (Signed)
  Subjective:  HPI: [name] is a 69 y.o. female who presents for anxiety, hot flashes, HTN  Please see Assessment and Plan below for the status of her chronic medical problems.   Objective:  Physical Exam: Vitals:   03/11/20 1013  BP: 132/71  Pulse: 80  Temp: 98.2 F (36.8 C)  TempSrc: Oral  SpO2: 99%  Weight: 173 lb 14.4 oz (78.9 kg)  Height: 5\' 3"  (1.6 m)   Body mass index is 30.81 kg/m. Physical Exam Vitals and nursing note reviewed.  Cardiovascular:     Rate and Rhythm: Normal rate and regular rhythm.     Pulses:          Popliteal pulses are 2+ on the right side and 2+ on the left side.       Dorsalis pedis pulses are 2+ on the right side and 2+ on the left side.  Pulmonary:     Effort: Pulmonary effort is normal.     Breath sounds: Normal breath sounds.  Musculoskeletal:     Right lower leg: Edema (1+) present.     Left lower leg: Edema (1+) present.    Assessment & Plan:  See Encounters Tab for problem based charting.  Medications Ordered Meds ordered this encounter  Medications  . amLODipine-olmesartan (AZOR) 5-40 MG tablet    Sig: Take 1 tablet by mouth daily.    Dispense:  90 tablet    Refill:  3    Change in dose of amlodipine  . diclofenac Sodium (VOLTAREN) 1 % GEL    Sig: Apply 4 g topically 4 (four) times daily.    Dispense:  100 g    Refill:  3   Other Orders Orders Placed This Encounter  Procedures  . POCT ABI Screening Pilot No Charge    This Order is for screening for the ABI Pilot.  It is a no charge exam.    Follow Up: Return in about 3 months (around 06/11/2020).

## 2020-03-15 NOTE — Assessment & Plan Note (Signed)
HPI: She has been taking amlodipine olmesartan 10-40 mg daily.  She does complain of leg cramps and occasional bilateral lower extremity swelling.  At her last visit leg cramps were worked up which included a D-dimer which was reassuring.  Her potassium levels and magnesium have been stable.  Vitamin D has improved.  Blood pressure is well controlled.  A: Essential hypertension  P: We will try reducing the dose of her amlodipine to see if this helps her lower extremity swelling.  We will change her Azor to 5-40 mg daily

## 2020-03-15 NOTE — Assessment & Plan Note (Signed)
HPI: She continues to have some leg cramping.  I am not exactly sure what is causing this at her last visit she did have electrolytes checked which were stable vitamin D which was normal D-dimer which was below 0.5, and normal ferritin  Assessment leg cramps  Plan Check to screening ABIs which were normal.  I have reassured her that I do not think leg cramps or anything dangerous.  Recommend continuing to follow.

## 2020-03-17 ENCOUNTER — Encounter: Payer: Self-pay | Admitting: *Deleted

## 2020-03-17 NOTE — Progress Notes (Unsigned)

## 2020-03-23 NOTE — Progress Notes (Unsigned)
Things That May Be Affecting Your Health:  Alcohol x Hearing loss  Pain   x Depression  Home Safety  Sexual Health   Diabetes  Lack of physical activity  Stress   Difficulty with daily activities  Loneliness  Tiredness   Drug use x Medicines  Tobacco use  x Falls  Motor Vehicle Safety  Weight   Food choices  Oral Health  Other    YOUR PERSONALIZED HEALTH PLAN : 1. Schedule your next subsequent Medicare Wellness visit in one year 2. Attend all of your regular appointments to address your medical issues 3. Complete the preventative screenings and services   Annual Wellness Visit   Medicare Covered Preventative Screenings and [place] Men and Women Who How Often Need? Date of Last Service Action  Abdominal Aortic Aneurysm Adults with AAA risk factors Once no    Alcohol Misuse and Counseling All Adults Screening once a year if no alcohol misuse. Counseling up to 4 face to face sessions.     Bone Density Measurement  Adults at risk for osteoporosis Once every 2 yrs y 2014   Lipid Panel Z13.6 All adults without CV disease Once every 5 yrs no    Colorectal Cancer   Stool sample or  Colonoscopy All adults 12 and older   Once every year  Every 10 years n 2014   Depression All Adults Once a year  Today   Diabetes Screening Blood glucose, post glucose load, or GTT Z13.1  All adults at risk  Pre-diabetics  Once per year  Twice per year y  May order a1c   Diabetes  Self-Management Training All adults Diabetics 10 hrs first year; 2 hours subsequent years. Requires Copay n    Glaucoma  Diabetics  Family history of glaucoma  African Americans 23 yrs +  Hispanic Americans 10 yrs + Annually - requires coppay y    Hepatitis C Z72.89 or F19.20  High Risk for HCV  Born between 1945 and 1965  Annually  Once n 2017   HIV Z11.4 All adults based on risk  Annually btw ages 52 & 43 regardless of risk  Annually > 65 yrs if at increased risk n    Lung  Cancer Screening Asymptomatic adults aged 52-77 with 24 pack yr history and current smoker OR quit within the last 15 yrs Annually Must have counseling and shared decision making documentation before first screen n    Medical Nutrition Therapy Adults with   Diabetes  Renal disease  Kidney transplant within past 3 yrs 3 hours first year; 2 hours subsequent years n    Obesity and Counseling All adults Screening once a year Counseling if BMI 30 or higher  Today   Tobacco Use Counseling Adults who use tobacco  Up to 8 visits in one year     Vaccines Z23  Hepatitis B  Influenza   Pneumonia  Adults   Once  Once every flu season  Two different vaccines separated by one year n    Next Annual Wellness Visit People with Medicare Every year  Today     Services & Screenings Women Who How Often Need  Date of Last Service Action  Mammogram  Z12.31 Women over 90 One baseline ages 66-39. Annually ager 80 yrs+ n 09/2019 Ok to do every 2 years  Pap tests All women Annually if high risk. Every 2 yrs for normal risk women n    Screening for cervical cancer with  Pap (Z01.419 nl or Z01.411abnl) &  HPV Z11.51 Women aged 16 to 41 Once every 5 yrs n    Screening pelvic and breast exams All women Annually if high risk. Every 2 yrs for normal risk women n    Sexually Transmitted Diseases  Chlamydia  Gonorrhea  Syphilis All at risk adults Annually for non pregnant females at increased risk n        Services & Screenings Men Who How Ofter Need  Date of Last Service Action  Prostate Cancer - DRE & PSA Men over 50 Annually.  DRE might require a copay.     Sexually Transmitted Diseases  Syphilis All at risk adults Annually for men at increased risk

## 2020-05-21 ENCOUNTER — Ambulatory Visit (INDEPENDENT_AMBULATORY_CARE_PROVIDER_SITE_OTHER): Payer: Medicare Other | Admitting: Internal Medicine

## 2020-05-21 ENCOUNTER — Other Ambulatory Visit: Payer: Self-pay

## 2020-05-21 ENCOUNTER — Encounter: Payer: Self-pay | Admitting: Internal Medicine

## 2020-05-21 VITALS — BP 152/76 | HR 74 | Temp 98.0°F | Ht 63.0 in | Wt 180.2 lb

## 2020-05-21 DIAGNOSIS — R109 Unspecified abdominal pain: Secondary | ICD-10-CM | POA: Insufficient documentation

## 2020-05-21 DIAGNOSIS — K219 Gastro-esophageal reflux disease without esophagitis: Secondary | ICD-10-CM

## 2020-05-21 NOTE — Patient Instructions (Signed)
Thank you, [name] for allowing [place] to provide your care today. Today we discussed GERD.     I have ordered the following medication/changed the following medications:  1. begin Nexium 20 mg daily 2. You can use Tums or Mylanta in the short term for pain relieff   Please follow-up in in 4 weeks. With your PCP  Should you have any questions or concerns please call the internal medicine clinic at [phone].    Marianna Payment, D.O. [facility]   My Chart Access: [email or web address]   If you have not already done so, please get your COVID 19 vaccine  To schedule an appointment for a COVID vaccine choice any of the following: Go to [email or web address]   Go to [email or web address]                  Call [phone]                                     Call [phone] and select Option 2

## 2020-05-22 ENCOUNTER — Encounter: Payer: Self-pay | Admitting: Internal Medicine

## 2020-05-22 NOTE — Progress Notes (Signed)
CC: Abdominal pain  HPI:  Eileen Cameron is a 69 y.o. female with a past medical history stated below and presents today for abdominal pain. Please see problem based assessment and plan for additional details.  Past Medical History:  Diagnosis Date  . Adrenal incidentaloma ([place]) 07/08/2015   1.0 by 1.4 cm lateral limb left adrenal mass on CT abdomen on 07/08/15 Followed up in April 2018, stable no further imaging required.  . Allergy    seasonal allergies, ace inhibitors--angioedema  . Hyperlipidemia   . Hypertension     Current Outpatient Medications on File Prior to Visit  Medication Sig Dispense Refill  . amLODipine-olmesartan (AZOR) 5-40 MG tablet Take 1 tablet by mouth daily. 90 tablet 3  . Cholecalciferol (VITAMIN D3 PO) Take 1 tablet by mouth daily.    . cyclobenzaprine (FLEXERIL) 5 MG tablet Take 10 mg by mouth 3 (three) times daily as needed for muscle spasms.    . diclofenac Sodium (VOLTAREN) 1 % GEL Apply 4 g topically 4 (four) times daily. 100 g 3  . esomeprazole (NEXIUM) 20 MG capsule Take 1 capsule (20 mg total) by mouth daily. 30 capsule 1  . fluticasone (FLONASE) 50 MCG/ACT nasal spray Place 2 sprays into both nostrils daily. 16 g 11  . meclizine (ANTIVERT) 25 MG tablet Take 1 tablet (25 mg total) by mouth 3 (three) times daily as needed for dizziness. 30 tablet 0  . Omega-3 Fatty Acids (FISH OIL PO) Take 1 capsule by mouth daily.    Marland Kitchen PARoxetine (PAXIL) 10 MG tablet Take 1 tablet (10 mg total) by mouth daily. 90 tablet 0   No current facility-administered medications on file prior to visit.    Family History  Problem Relation Age of Onset  . Heart disease Mother 81  . Diabetes Mother   . Hypertension Sister   . Heart disease Maternal Grandmother     Social History   Socioeconomic History  . Marital status: Divorced    Spouse name: Not on file  . Number of children: 1  . Years of education: 56  . Highest education level: Not on file  Occupational  History  . Occupation: Retired   Tobacco Use  . Smoking status: Never Smoker  . Smokeless tobacco: Never Used  Vaping Use  . Vaping Use: Never used  Substance and Sexual Activity  . Alcohol use: No    Alcohol/week: 0.0 standard drinks  . Drug use: No  . Sexual activity: Not on file  Other Topics Concern  . Not on file  Social History Narrative   Lives alone   Caffeine use: none   Right handed    Social Determinants of Health   Financial Resource Strain:   . Difficulty of Paying Living Expenses:   Food Insecurity:   . Worried About Charity fundraiser in the Last Year:   . Arboriculturist in the Last Year:   Transportation Needs:   . Film/video editor (Medical):   Marland Kitchen Lack of Transportation (Non-Medical):   Physical Activity:   . Days of Exercise per Week:   . Minutes of Exercise per Session:   Stress:   . Feeling of Stress :   Social Connections:   . Frequency of Communication with Friends and Family:   . Frequency of Social Gatherings with Friends and Family:   . Attends Religious Services:   . Active Member of Clubs or Organizations:   . Attends Archivist Meetings:   .  Marital Status:   Intimate Partner Violence:   . Fear of Current or Ex-Partner:   . Emotionally Abused:   Marland Kitchen Physically Abused:   . Sexually Abused:     Review of Systems: ROS negative except for what is noted on the assessment and plan.  Vitals:   05/21/20 0955 05/21/20 1002  BP: (!) 180/82 (!) 152/76  Pulse: 79 74  Temp: 98 F (36.7 C)   TempSrc: Oral   SpO2: 100%   Weight: 180 lb 3.2 oz (81.7 kg)   Height: 5\' 3"  (1.6 m)      Physical Exam: Physical Exam Constitutional:      Appearance: Normal appearance.  HENT:     Head: Normocephalic and atraumatic.  Eyes:     Extraocular Movements: Extraocular movements intact.  Cardiovascular:     Rate and Rhythm: Normal rate.     Pulses: Normal pulses.     Heart sounds: Normal heart sounds.  Pulmonary:     Effort:  Pulmonary effort is normal.     Breath sounds: Normal breath sounds.  Abdominal:     General: Bowel sounds are normal.     Palpations: Abdomen is soft.     Tenderness: There is no abdominal tenderness.  Musculoskeletal:        General: Normal range of motion.     Cervical back: Normal range of motion.     Right lower leg: No edema.     Left lower leg: No edema.  Skin:    General: Skin is warm and dry.  Neurological:     Mental Status: She is alert and oriented to person, place, and time. Mental status is at baseline.  Psychiatric:        Mood and Affect: Mood normal.      Assessment & Plan:   See Encounters Tab for problem based charting.  Patient discussed with Dr. Hilbert Odor, D.O. [facility], PGY-2 Pager: [phone], Phone: [phone] Date 05/22/2020 Time 6:23 AM

## 2020-05-22 NOTE — Assessment & Plan Note (Addendum)
Patient presents with a 2-week history of epigastric abdominal pain that radiates up. She states that she has associated hiccups and reflux. She has tried Tums and Mylanta which improved her symptoms but states that her pain returns. Since that her abdominal pain is worse particularly at night when laying down. As result, she is needed to elevate the head of her bed to improve her symptoms.  She denies any chest pain, shortness of breath, emesis, constipation, diarrhea.  I counseled the patient regarding her symptoms and the likelihood that she is experiencing gastroesophageal reflux. Patient was previously diagnosed with this condition and started on Nexium 20 mg daily. Patient states that she tried this medication, but discontinued it after 3 days as she did not find any relief. I reassured her that this medication takes a week or more before she will notice an imoprovement. I recommended using Tums or Mylanta in the short-term to help with these symptoms while taking Nexium. She admits understanding and agrees with plan.  Plan: -Nexium 20 mg daily -Use Mylanta or Tums in the short-term -Follow-up in 4 weeks with PCP to reassess symptoms.

## 2020-05-24 NOTE — Progress Notes (Signed)
Internal Medicine Clinic Attending  Case discussed with Dr. Coe  At the time of the visit.  We reviewed the resident's history and exam and pertinent patient test results.  I agree with the assessment, diagnosis, and plan of care documented in the resident's note.  

## 2020-06-10 ENCOUNTER — Telehealth: Payer: Self-pay

## 2020-06-10 NOTE — Telephone Encounter (Signed)
Requesting to speak with a nurse about annul wellness.

## 2020-06-10 NOTE — Telephone Encounter (Signed)
Returned call to patient. Confirmed appt for 06/14/2020 at 1015 for [facility] AWV. Hubbard Hartshorn, BSN, RN-BC

## 2020-06-14 ENCOUNTER — Encounter: Payer: Self-pay | Admitting: Internal Medicine

## 2020-06-14 ENCOUNTER — Ambulatory Visit (INDEPENDENT_AMBULATORY_CARE_PROVIDER_SITE_OTHER): Payer: Medicare Other | Admitting: Internal Medicine

## 2020-06-14 ENCOUNTER — Other Ambulatory Visit: Payer: Self-pay

## 2020-06-14 DIAGNOSIS — E2839 Other primary ovarian failure: Secondary | ICD-10-CM | POA: Diagnosis not present

## 2020-06-14 DIAGNOSIS — Z0000 Encounter for general adult medical examination without abnormal findings: Secondary | ICD-10-CM

## 2020-06-14 NOTE — Progress Notes (Signed)
I discussed the AWV findings with the RN who conducted the visit. I was present in the office suite and immediately available to provide assistance and direction throughout the time the service was provided.   

## 2020-06-14 NOTE — Progress Notes (Signed)
This AWV is being conducted by [facility] only. The patient was located at home and I was located in [facility]. The patient's identity was confirmed using their DOB and current address. The patient or his/her legal guardian has consented to being evaluated through a telephone encounter and understands the associated risks (an examination cannot be done and the patient may need to come in for an appointment) / benefits (allows the patient to remain at home, decreasing exposure to coronavirus). I personally spent 30 minutes conducting the AWV.  Subjective:   Eileen Cameron is a 69 y.o. female who presents for a Medicare Annual Wellness Visit.  The following items have been reviewed and updated today in the appropriate area in the EMR.   Health Risk Assessment  Height, weight, BMI, and BP Visual acuity if needed Depression screen Fall risk / safety level Advance directive discussion Medical and family history were reviewed and updated Updating list of other providers & suppliers Medication reconciliation, including over the counter medicines Cognitive screen Written screening schedule Risk Factor list Personalized health advice, risky behaviors, and treatment advice  Social History   Social History Narrative   Current Social History 06/14/2020        Patient lives alone in a ground floor apartment which is 1 story. There are not steps up to the entrance the patient uses.       Patient's method of transportation is personal car.      The highest level of education was high school diploma.      The patient currently retired from [employer].      Identified important Relationships are "My grandchildren (3), my daughter."       Pets : None       Interests / Fun: "I walk, talk to my neighbors."       Current Stressors: "Fear of having panic attacks/anxiety."       Religious / Personal Beliefs: "I accept Jesus Christ as my Savior; I am saved."       Eileen Cameron, BSN, RN-BC              Objective:    Vitals: There were no vitals taken for this visit. Vitals are unable to obtained due to [id number] public health emergency  Activities of Daily Living In your present state of health, do you have any difficulty performing the following activities: 06/14/2020 05/21/2020  Hearing? N N  Vision? N N  Difficulty concentrating or making decisions? N N  Walking or climbing stairs? N N  Dressing or bathing? N N  Doing errands, shopping? N N  Some recent data might be hidden    Goals Goals    .  Blood Pressure < 150/90    .  Continue walking 1 mile every day (pt-stated)       Fall Risk Fall Risk  06/14/2020 05/21/2020 03/11/2020 02/24/2020 01/29/2020  Falls in the past year? 0 0 0 0 0  Number falls in past yr: - - - 0 0  Injury with Fall? - - - - -  Risk for fall due to : - No Fall Risks Other (Comment) Impaired balance/gait -  Risk for fall due to: Comment - - Right leg pain. right leg pain -  Follow up Education provided;Falls prevention discussed Falls prevention discussed Falls prevention discussed Falls evaluation completed -  Comment - - - - -   CDC Handout on Fall Prevention and Handout on Home Exercise Program, Access codes [phone]  and [id number] mailed to patient with exercise band.    Depression Screen PHQ 2/9 Scores 06/14/2020 05/21/2020 03/11/2020 01/29/2020  PHQ - 2 Score 1 0 4 6  PHQ- 9 Score 4 0 9 19     Cognitive Testing Six-Item Cognitive Screener   "I would like to ask you some questions that ask you to use your memory. I am going to name three objects. Please wait until I say all three words, then repeat them. Remember what they are  because I am going to ask you to name them again in a few minutes. Please repeat these words for me: APPLE--TABLE--PENNY." (Interviewer may repeat names 3 times if necessary but repetition not scored.)  Did patient correctly repeat all three words? Yes - may proceed with screen  What year is this? Correct What month  is this? Correct What day of the week is this? Correct  What were the three objects I asked you to remember? . Apple Correct . Table Correct . Penny Correct  Score one point for each incorrect answer.  A score of 2 or more points warrants additional investigation.  Patient's score 0   Assessment and Plan:     Patient stopped taking Paxil about a month ago. Stated it made her feel worse. A referral has been sent to [facility] for a Dexa Scan Patient will call to schedule eye exam with Dr. Katy Fitch She will begin seated and standing exercises with exercise band to increase strength and balance. Patient will continue walking a mile every day   During the course of the visit the patient was educated and counseled about appropriate screening and preventive services as documented in the assessment and plan.  The printed AVS was given to the patient and included an updated screening schedule, a list of risk factors, and personalized health advice.        Velora Heckler, RN  06/14/2020

## 2020-06-14 NOTE — Patient Instructions (Addendum)
Things That May Be Affecting Your Health:  Alcohol  Hearing loss  Pain   x Anxiety  Home Safety  Sexual Health   Diabetes  Lack of physical activity  Stress   Difficulty with daily activities  Loneliness  Tiredness   Drug use x Medicines  Tobacco use   Falls  Motor Vehicle Safety  Weight   Food choices  Oral Health  Other    YOUR PERSONALIZED HEALTH PLAN : 1. Schedule your next subsequent Medicare Wellness visit in one year 2. Attend all of your regular appointments to address your medical issues 3. Complete the preventative screenings and services 4. A referral has been sent to [facility] for a Dexa Scan 5. Please call to schedule eye exam with Dr. Katy Fitch 6. Begin seated and standing exercises with exercise band to increase strength and balance. 7. Keep up the good work walking a mile every day!!  Annual Wellness Visit                       Medicare Covered Preventative Screenings and Services  Services & Screenings Men and Women Who How Often Need? Date of Last Service Action  Abdominal Aortic Aneurysm Adults with AAA risk factors Once no    Alcohol Misuse and Counseling All Adults Screening once a year if no alcohol misuse. Counseling up to 4 face to face sessions.     Bone Density Measurement  Adults at risk for osteoporosis Once every 2 yrs y 2014 A referral has been sent to [facility] for a Dexa Scan  Lipid Panel Z13.6 All adults without CV disease Once every 5 yrs no    Colorectal Cancer   Stool sample or  Colonoscopy All adults 39 and older   Once every year  Every 10 years n 2014   Depression All Adults Once a year  Today   Diabetes Screening Blood glucose, post glucose load, or GTT Z13.1  All adults at risk  Pre-diabetics  Once per year  Twice per year y  May order a1c at your next office visit  Diabetes  Self-Management Training All adults Diabetics 10 hrs first year; 2 hours subsequent years. Requires Copay n     Glaucoma  Diabetics  Family history of glaucoma  African Americans 58 yrs +  Hispanic Americans 32 yrs + Annually - requires coppay y  Please call to schedule eye exam with Dr. Katy Fitch  Hepatitis C Z72.89 or F19.20  High Risk for HCV  Born between 1945 and 1965  Annually  Once n 2017   HIV Z11.4 All adults based on risk  Annually btw ages 76 & 72 regardless of risk  Annually > 65 yrs if at increased risk n    Lung Cancer Screening Asymptomatic adults aged 30-77 with 30 pack yr history and current smoker OR quit within the last 15 yrs Annually Must have counseling and shared decision making documentation before first screen n    Medical Nutrition Therapy Adults with   Diabetes  Renal disease  Kidney transplant within past 3 yrs 3 hours first year; 2 hours subsequent years n    Obesity and Counseling All adults Screening once a year Counseling if BMI 30 or higher  Today   Tobacco Use Counseling Adults who use tobacco  Up to 8 visits in one year     Vaccines Z23  Hepatitis B  Influenza   Pneumonia  Adults   Once  Once every  flu season  Two different vaccines separated by one year n  Yearly flu vaccine starting September 1  Next Annual Wellness Visit People with Medicare Every year  Today     [place] Women Who How Often Need  Date of Last Service Action  Mammogram  Z12.31 Women over 33 One baseline ages 41-39. Annually ager 38 yrs+ n 09/2019 Ok to do every 2 years  Pap tests All women Annually if high risk. Every 2 yrs for normal risk women n    Screening for cervical cancer with   Pap (Z01.419 nl or Z01.411abnl) &  HPV Z11.51 Women aged 24 to 41 Once every 5 yrs n    Screening pelvic and breast exams All women Annually if high risk. Every 2 yrs for normal risk women n    Sexually Transmitted Diseases  Chlamydia  Gonorrhea  Syphilis All at risk adults Annually for non pregnant females at increased risk n          Services & Screenings Men Who How Ofter Need  Date of Last Service Action  Prostate Cancer - DRE & PSA Men over 50 Annually.  DRE might require a copay.     Sexually Transmitted Diseases  Syphilis All at risk adults Annually for men at increased risk            Fall Prevention in the Home, Adult Falls can cause injuries. They can happen to people of all ages. There are many things you can do to make your home safe and to help prevent falls. Ask for help when making these changes, if needed. What actions can I take to prevent falls? General Instructions  Use good lighting in all rooms. Replace any light bulbs that burn out.  Turn on the lights when you go into a dark area. Use night-lights.  Keep items that you use often in easy-to-reach places. Lower the shelves around your home if necessary.  Set up your furniture so you have a clear path. Avoid moving your furniture around.  Do not have throw rugs and other things on the floor that can make you trip.  Avoid walking on wet floors.  If any of your floors are uneven, fix them.  Add color or contrast paint or tape to clearly mark and help you see: ? Any grab bars or handrails. ? First and last steps of stairways. ? Where the edge of each step is.  If you use a stepladder: ? Make sure that it is fully opened. Do not climb a closed stepladder. ? Make sure that both sides of the stepladder are locked into place. ? Ask someone to hold the stepladder for you while you use it.  If there are any pets around you, be aware of where they are. What can I do in the bathroom?      Keep the floor dry. Clean up any water that spills onto the floor as soon as it happens.  Remove soap buildup in the tub or shower regularly.  Use non-skid mats or decals on the floor of the tub or shower.  Attach bath mats securely with double-sided, non-slip rug tape.  If you need to sit down in the shower, use a plastic, non-slip  stool.  Install grab bars by the toilet and in the tub and shower. Do not use towel bars as grab bars. What can I do in the bedroom?  Make sure that you have a light by your bed that is  easy to reach.  Do not use any sheets or blankets that are too big for your bed. They should not hang down onto the floor.  Have a firm chair that has side arms. You can use this for support while you get dressed. What can I do in the kitchen?  Clean up any spills right away.  If you need to reach something above you, use a strong step stool that has a grab bar.  Keep electrical cords out of the way.  Do not use floor polish or wax that makes floors slippery. If you must use wax, use non-skid floor wax. What can I do with my stairs?  Do not leave any items on the stairs.  Make sure that you have a light switch at the top of the stairs and the bottom of the stairs. If you do not have them, ask someone to add them for you.  Make sure that there are handrails on both sides of the stairs, and use them. Fix handrails that are broken or loose. Make sure that handrails are as long as the stairways.  Install non-slip stair treads on all stairs in your home.  Avoid having throw rugs at the top or bottom of the stairs. If you do have throw rugs, attach them to the floor with carpet tape.  Choose a carpet that does not hide the edge of the steps on the stairway.  Check any carpeting to make sure that it is firmly attached to the stairs. Fix any carpet that is loose or worn. What can I do on the outside of my home?  Use bright outdoor lighting.  Regularly fix the edges of walkways and driveways and fix any cracks.  Remove anything that might make you trip as you walk through a door, such as a raised step or threshold.  Trim any bushes or trees on the path to your home.  Regularly check to see if handrails are loose or broken. Make sure that both sides of any steps have handrails.  Install guardrails  along the edges of any raised decks and porches.  Clear walking paths of anything that might make someone trip, such as tools or rocks.  Have any leaves, snow, or ice cleared regularly.  Use sand or salt on walking paths during winter.  Clean up any spills in your garage right away. This includes grease or oil spills. What other actions can I take?  Wear shoes that: ? Have a low heel. Do not wear high heels. ? Have rubber bottoms. ? Are comfortable and fit you well. ? Are closed at the toe. Do not wear open-toe sandals.  Use tools that help you move around (mobility aids) if they are needed. These include: ? Canes. ? Walkers. ? Scooters. ? Crutches.  Review your medicines with your doctor. Some medicines can make you feel dizzy. This can increase your chance of falling. Ask your doctor what other things you can do to help prevent falls. Where to find more information  Centers for Disease Control and Prevention, STEADI: https://garcia.biz/  [employer] on Aging: BrainJudge.co.uk Contact a doctor if:  You are afraid of falling at home.  You feel weak, drowsy, or dizzy at home.  You fall at home. Summary  There are many simple things that you can do to make your home safe and to help prevent falls.  Ways to make your home safe include removing tripping hazards and installing grab bars in the bathroom.  Ask  for help when making these changes in your home. This information is not intended to replace advice given to you by your health care provider. Make sure you discuss any questions you have with your health care provider. Document Revised: 02/13/2019 Document Reviewed: 06/07/2017 Elsevier Patient Education  2020 [place] Maintenance, Female Adopting a healthy lifestyle and getting preventive care are important in promoting health and wellness. Ask your health care provider about:  The right schedule for you to have regular tests and  exams.  Things you can do on your own to prevent diseases and keep yourself healthy. What should I know about diet, weight, and exercise? Eat a healthy diet   Eat a diet that includes plenty of vegetables, fruits, low-fat dairy products, and lean protein.  Do not eat a lot of foods that are high in solid fats, added sugars, or sodium. Maintain a healthy weight Body mass index (BMI) is used to identify weight problems. It estimates body fat based on height and weight. Your health care provider can help determine your BMI and help you achieve or maintain a healthy weight. Get regular exercise Get regular exercise. This is one of the most important things you can do for your health. Most adults should:  Exercise for at least 150 minutes each week. The exercise should increase your heart rate and make you sweat (moderate-intensity exercise).  Do strengthening exercises at least twice a week. This is in addition to the moderate-intensity exercise.  Spend less time sitting. Even light physical activity can be beneficial. Watch cholesterol and blood lipids Have your blood tested for lipids and cholesterol at 71 years of age, then have this test every 5 years. Have your cholesterol levels checked more often if:  Your lipid or cholesterol levels are high.  You are older than 71 years of age.  You are at high risk for heart disease. What should I know about cancer screening? Depending on your health history and family history, you may need to have cancer screening at various ages. This may include screening for:  Breast cancer.  Cervical cancer.  Colorectal cancer.  Skin cancer.  Lung cancer. What should I know about heart disease, diabetes, and high blood pressure? Blood pressure and heart disease  High blood pressure causes heart disease and increases the risk of stroke. This is more likely to develop in people who have high blood pressure readings, are of African descent, or are  overweight.  Have your blood pressure checked: ? Every 3-5 years if you are 70-89 years of age. ? Every year if you are 76 years old or older. Diabetes Have regular diabetes screenings. This checks your fasting blood sugar level. Have the screening done:  Once every three years after age 15 if you are at a normal weight and have a low risk for diabetes.  More often and at a younger age if you are overweight or have a high risk for diabetes. What should I know about preventing infection? Hepatitis B If you have a higher risk for hepatitis B, you should be screened for this virus. Talk with your health care provider to find out if you are at risk for hepatitis B infection. Hepatitis C Testing is recommended for:  Everyone born from [birth year] through 1965.  Anyone with known risk factors for hepatitis C. Sexually transmitted infections (STIs)  Get screened for STIs, including gonorrhea and chlamydia, if: ? You are sexually active and are younger than 71 years of  age. ? You are older than 71 years of age and your health care provider tells you that you are at risk for this type of infection. ? Your sexual activity has changed since you were last screened, and you are at increased risk for chlamydia or gonorrhea. Ask your health care provider if you are at risk.  Ask your health care provider about whether you are at high risk for HIV. Your health care provider may recommend a prescription medicine to help prevent HIV infection. If you choose to take medicine to prevent HIV, you should first get tested for HIV. You should then be tested every 3 months for as long as you are taking the medicine. Pregnancy  If you are about to stop having your period (premenopausal) and you may become pregnant, seek counseling before you get pregnant.  Take 400 to 800 micrograms (mcg) of folic acid every day if you become pregnant.  Ask for birth control (contraception) if you want to prevent  pregnancy. Osteoporosis and menopause Osteoporosis is a disease in which the bones lose minerals and strength with aging. This can result in bone fractures. If you are 12 years old or older, or if you are at risk for osteoporosis and fractures, ask your health care provider if you should:  Be screened for bone loss.  Take a calcium or vitamin D supplement to lower your risk of fractures.  Be given hormone replacement therapy (HRT) to treat symptoms of menopause. Follow these instructions at home: Lifestyle  Do not use any products that contain nicotine or tobacco, such as cigarettes, e-cigarettes, and chewing tobacco. If you need help quitting, ask your health care provider.  Do not use street drugs.  Do not share needles.  Ask your health care provider for help if you need support or information about quitting drugs. Alcohol use  Do not drink alcohol if: ? Your health care provider tells you not to drink. ? You are pregnant, may be pregnant, or are planning to become pregnant.  If you drink alcohol: ? Limit how much you use to 0-1 drink a day. ? Limit intake if you are breastfeeding.  Be aware of how much alcohol is in your drink. In the [place] one drink equals one 12 oz bottle of beer (355 mL), one 5 oz glass of wine (148 mL), or one 1 oz glass of hard liquor (44 mL). General instructions  Schedule regular health, dental, and eye exams.  Stay current with your vaccines.  Tell your health care provider if: ? You often feel depressed. ? You have ever been abused or do not feel safe at home. Summary  Adopting a healthy lifestyle and getting preventive care are important in promoting health and wellness.  Follow your health care provider's instructions about healthy diet, exercising, and getting tested or screened for diseases.  Follow your health care provider's instructions on monitoring your cholesterol and blood pressure. This information is not intended to replace  advice given to you by your health care provider. Make sure you discuss any questions you have with your health care provider. Document Revised: 10/16/2018 Document Reviewed: 10/16/2018 Elsevier Patient Education  2020 [employer].

## 2020-06-16 NOTE — Progress Notes (Signed)
Internal Medicine Clinic Attending  Case discussed with Dr. Masoudi at the time of the visit.  We reviewed the AWV findings.  I agree with the assessment, diagnosis, and plan of care documented in the AWV note.     

## 2020-06-23 ENCOUNTER — Other Ambulatory Visit: Payer: Self-pay | Admitting: Internal Medicine

## 2020-06-23 DIAGNOSIS — E2839 Other primary ovarian failure: Secondary | ICD-10-CM

## 2020-06-24 ENCOUNTER — Other Ambulatory Visit: Payer: Self-pay

## 2020-06-24 ENCOUNTER — Encounter: Payer: Self-pay | Admitting: Internal Medicine

## 2020-06-24 ENCOUNTER — Ambulatory Visit (INDEPENDENT_AMBULATORY_CARE_PROVIDER_SITE_OTHER): Payer: Medicare Other | Admitting: Internal Medicine

## 2020-06-24 VITALS — BP 162/71 | HR 70 | Temp 98.0°F | Ht 63.0 in | Wt 185.9 lb

## 2020-06-24 DIAGNOSIS — R5382 Chronic fatigue, unspecified: Secondary | ICD-10-CM

## 2020-06-24 DIAGNOSIS — Z9189 Other specified personal risk factors, not elsewhere classified: Secondary | ICD-10-CM | POA: Diagnosis not present

## 2020-06-24 DIAGNOSIS — I10 Essential (primary) hypertension: Secondary | ICD-10-CM | POA: Diagnosis not present

## 2020-06-24 DIAGNOSIS — G2581 Restless legs syndrome: Secondary | ICD-10-CM

## 2020-06-24 DIAGNOSIS — R252 Cramp and spasm: Secondary | ICD-10-CM

## 2020-06-24 DIAGNOSIS — F322 Major depressive disorder, single episode, severe without psychotic features: Secondary | ICD-10-CM | POA: Diagnosis not present

## 2020-06-24 MED ORDER — ROSUVASTATIN CALCIUM 5 MG PO TABS: 5.0000 mg | ORAL_TABLET | Freq: Every day | ORAL | 3 refills | Status: DC

## 2020-06-24 MED ORDER — SERTRALINE HCL 50 MG PO TABS: 50.0000 mg | ORAL_TABLET | Freq: Every day | ORAL | 2 refills | Status: DC

## 2020-06-24 NOTE — Patient Instructions (Signed)
I want you to start taking Sertraline 50mg  daily for anxiety I want you to start taking Rosuvastatin 5mg  daily for cholesterol. I will see you back in about 3 months.

## 2020-06-24 NOTE — Progress Notes (Signed)
°  Subjective:  HPI: [name] is a 69 y.o. female who presents for f/u anxiety, HTN.  Please see Assessment and Plan below for the status of her chronic medical problems.  Objective:  Physical Exam: Vitals:   06/24/20 1006 06/24/20 1111  BP: (!) 161/77 (!) 162/71  Pulse: 74 70  Temp: 98 F (36.7 C)   TempSrc: Oral   SpO2: 100%   Weight: 185 lb 14.4 oz (84.3 kg)   Height: 5\' 3"  (1.6 m)    Body mass index is 32.93 kg/m. Physical Exam Vitals and nursing note reviewed.  Cardiovascular:     Rate and Rhythm: Normal rate and regular rhythm.  Pulmonary:     Effort: Pulmonary effort is normal.     Breath sounds: Normal breath sounds.  Musculoskeletal:     Right lower leg: No edema.     Left lower leg: No edema.  Psychiatric:        Mood and Affect: Mood normal.    Assessment & Plan:  See Encounters Tab for problem based charting.  Medications Ordered Meds ordered this encounter  Medications   sertraline (ZOLOFT) 50 MG tablet    Sig: Take 1 tablet (50 mg total) by mouth daily.    Dispense:  30 tablet    Refill:  2   rosuvastatin (CRESTOR) 5 MG tablet    Sig: Take 1 tablet (5 mg total) by mouth daily.    Dispense:  90 tablet    Refill:  3   Other Orders Orders Placed This Encounter  Procedures   CMP14 + Anion Gap   CBC with Diff   Lipid Profile   Ferritin   Follow Up: Return in about 3 months (around 09/24/2020).

## 2020-06-25 LAB — CBC WITH DIFFERENTIAL/PLATELET
Basophils Absolute: 0 10*3/uL (ref 0.0–0.2)
Basos: 1 %
EOS (ABSOLUTE): 0 10*3/uL (ref 0.0–0.4)
Eos: 0 %
Hematocrit: 41.5 % (ref 34.0–46.6)
Hemoglobin: 13.7 g/dL (ref 11.1–15.9)
Immature Grans (Abs): 0 10*3/uL (ref 0.0–0.1)
Immature Granulocytes: 0 %
Lymphocytes Absolute: 2 10*3/uL (ref 0.7–3.1)
Lymphs: 39 %
MCH: 29.7 pg (ref 26.6–33.0)
MCHC: 33 g/dL (ref 31.5–35.7)
MCV: 90 fL (ref 79–97)
Monocytes Absolute: 0.4 10*3/uL (ref 0.1–0.9)
Monocytes: 9 %
Neutrophils Absolute: 2.5 10*3/uL (ref 1.4–7.0)
Neutrophils: 51 %
Platelets: 236 10*3/uL (ref 150–450)
RBC: 4.62 x10E6/uL (ref 3.77–5.28)
RDW: 13.1 % (ref 11.7–15.4)
WBC: 5 10*3/uL (ref 3.4–10.8)

## 2020-06-25 LAB — CMP14 + ANION GAP
ALT: 7 IU/L (ref 0–32)
AST: 17 IU/L (ref 0–40)
Albumin/Globulin Ratio: 1.6 (ref 1.2–2.2)
Albumin: 4.6 g/dL (ref 3.7–4.7)
Alkaline Phosphatase: 116 IU/L (ref 48–121)
Anion Gap: 16 mmol/L (ref 10.0–18.0)
BUN/Creatinine Ratio: 15 (ref 12–28)
BUN: 10 mg/dL (ref 8–27)
Bilirubin Total: 0.3 mg/dL (ref 0.0–1.2)
CO2: 23 mmol/L (ref 20–29)
Calcium: 9.8 mg/dL (ref 8.7–10.3)
Chloride: 103 mmol/L (ref 96–106)
Creatinine, Ser: 0.68 mg/dL (ref 0.57–1.00)
GFR calc Af Amer: 102 mL/min/{1.73_m2} (ref >59)
GFR calc non Af Amer: 88 mL/min/{1.73_m2} (ref >59)
Globulin, Total: 2.8 g/dL (ref 1.5–4.5)
Glucose: 88 mg/dL (ref 65–99)
Potassium: 4.2 mmol/L (ref 3.5–5.2)
Sodium: 142 mmol/L (ref 134–144)
Total Protein: 7.4 g/dL (ref 6.0–8.5)

## 2020-06-25 LAB — LIPID PANEL
Chol/HDL Ratio: 2.6 ratio (ref 0.0–4.4)
Cholesterol, Total: 189 mg/dL (ref 100–199)
HDL: 73 mg/dL (ref >39)
LDL Chol Calc (NIH): 104 mg/dL — ABNORMAL HIGH (ref 0–99)
Triglycerides: 64 mg/dL (ref 0–149)
VLDL Cholesterol Cal: 12 mg/dL (ref 5–40)

## 2020-06-25 LAB — FERRITIN: Ferritin: 47 ng/mL (ref 15–150)

## 2020-06-29 ENCOUNTER — Telehealth: Payer: Self-pay | Admitting: *Deleted

## 2020-06-29 NOTE — Telephone Encounter (Signed)
Discussed that is a common side effect of ferrous sulfate. Instructed her to discontinue and start ferrous gluconate. If this causes the same problem to call back and let me know.

## 2020-06-29 NOTE — Assessment & Plan Note (Signed)
HPI: Patient has history of anxiety and major depression.  Has been very resistant to taking medications.  She reports that the Paxil that I recently prescribed made her anxiety worse and did not want to take that.  She is requesting I represcribe Zoloft, she never actually took this medication but wants to give it a try now.  Assessment major depressive disorder with anxiety  Plan Zoloft 50 mg daily follow-up in 3 months.

## 2020-06-29 NOTE — Assessment & Plan Note (Signed)
Will retry trial of Crestor 5 mg daily now that vitamin D has been supplemented.

## 2020-06-29 NOTE — Assessment & Plan Note (Signed)
Patient having some restless legs symptoms especially bad at night associated with leg cramping.  Feels better with leg movement.  Assessment leg cramps, possible restless leg syndrome  Plan Recheck ferritin this was below 75 I will have her start oral iron supplementation with ferrous sulfate every other day.

## 2020-06-29 NOTE — Telephone Encounter (Signed)
Patient state Dr. Heber [place] told her to take OTC iron pills. She is not able to take them as they make her constipated and mess her stomach up.  Can he give her something different.

## 2020-06-29 NOTE — Assessment & Plan Note (Signed)
HPI: Lower extremity swelling has improved with reduced dose of amlodipine.  Blood pressure is elevated.  Asymptomatic.  Assessment essential hypertension not at goal  Plan Continue her on amlodipine olmesartan 5-40 mg.  May need to add thiazide we will try salt reduction first.

## 2020-08-30 ENCOUNTER — Other Ambulatory Visit: Payer: Self-pay

## 2020-08-30 ENCOUNTER — Ambulatory Visit
Admission: RE | Admit: 2020-08-30 | Discharge: 2020-08-30 | Disposition: A | Discharge status: Home / Self Care | Payer: Medicare Other | Source: Ambulatory Visit | Attending: Internal Medicine | Admitting: Internal Medicine

## 2020-08-30 DIAGNOSIS — E2839 Other primary ovarian failure: Secondary | ICD-10-CM

## 2020-09-23 ENCOUNTER — Other Ambulatory Visit: Payer: Self-pay | Admitting: *Deleted

## 2020-09-23 MED ORDER — SERTRALINE HCL 50 MG PO TABS: 50.0000 mg | ORAL_TABLET | Freq: Every day | ORAL | 2 refills | Status: DC

## 2020-11-08 DIAGNOSIS — H25813 Combined forms of age-related cataract, bilateral: Secondary | ICD-10-CM | POA: Diagnosis not present

## 2020-11-08 DIAGNOSIS — H1045 Other chronic allergic conjunctivitis: Secondary | ICD-10-CM | POA: Diagnosis not present

## 2020-12-02 DIAGNOSIS — H608X3 Other otitis externa, bilateral: Secondary | ICD-10-CM | POA: Diagnosis not present

## 2020-12-02 DIAGNOSIS — J343 Hypertrophy of nasal turbinates: Secondary | ICD-10-CM | POA: Diagnosis not present

## 2020-12-02 DIAGNOSIS — R0982 Postnasal drip: Secondary | ICD-10-CM | POA: Diagnosis not present

## 2020-12-02 DIAGNOSIS — J342 Deviated nasal septum: Secondary | ICD-10-CM | POA: Diagnosis not present

## 2020-12-30 ENCOUNTER — Ambulatory Visit (INDEPENDENT_AMBULATORY_CARE_PROVIDER_SITE_OTHER): Payer: Medicare Other | Admitting: Internal Medicine

## 2020-12-30 ENCOUNTER — Encounter: Payer: Self-pay | Admitting: Internal Medicine

## 2020-12-30 ENCOUNTER — Other Ambulatory Visit: Payer: Self-pay

## 2020-12-30 VITALS — BP 149/81 | HR 75 | Temp 98.2°F | Ht 63.0 in | Wt 192.9 lb

## 2020-12-30 DIAGNOSIS — R140 Abdominal distension (gaseous): Secondary | ICD-10-CM

## 2020-12-30 DIAGNOSIS — F322 Major depressive disorder, single episode, severe without psychotic features: Secondary | ICD-10-CM

## 2020-12-30 DIAGNOSIS — R232 Flushing: Secondary | ICD-10-CM

## 2020-12-30 DIAGNOSIS — I10 Essential (primary) hypertension: Secondary | ICD-10-CM | POA: Diagnosis not present

## 2020-12-30 DIAGNOSIS — Z9189 Other specified personal risk factors, not elsewhere classified: Secondary | ICD-10-CM

## 2020-12-30 MED ORDER — VENLAFAXINE HCL ER 37.5 MG PO CP24: 37.5000 mg | ORAL_CAPSULE | Freq: Every day | ORAL | 2 refills | Status: DC

## 2020-12-30 NOTE — Progress Notes (Unsigned)
  Subjective:  HPI: [name] is a 70 y.o. female who presents for HTN, bloating, backpain  Please see Assessment and Plan below for the status of her chronic medical problems.  Objective:  Physical Exam: Vitals:   12/30/20 1023  BP: (!) 158/75  Pulse: 85  Temp: 98.2 F (36.8 C)  TempSrc: Oral  SpO2: 99%  Weight: 192 lb 14.4 oz (87.5 kg)  Height: 5\' 3"  (1.6 m)   Body mass index is 34.17 kg/m. Physical Exam Vitals reviewed.  Constitutional:      Appearance: She is well-developed.  Cardiovascular:     Rate and Rhythm: Normal rate and regular rhythm.     Heart sounds: Normal heart sounds.  Pulmonary:     Effort: Pulmonary effort is normal.     Breath sounds: Normal breath sounds.  Abdominal:     General: Bowel sounds are normal.     Tenderness: There is abdominal tenderness (mild) in the right lower quadrant. There is no right CVA tenderness, left CVA tenderness, guarding or rebound. Negative signs include Murphy's sign.  Musculoskeletal:     Comments: Mild bilateral lumbar paraspinal tenderness  Neurological:     Mental Status: She is alert.    Assessment & Plan:  See Encounters Tab for problem based charting.  Medications Ordered No orders of the defined types were placed in this encounter.  Other Orders No orders of the defined types were placed in this encounter.  Follow Up: No follow-ups on file.

## 2020-12-31 LAB — CMP14 + ANION GAP
ALT: 8 IU/L (ref 0–32)
AST: 18 IU/L (ref 0–40)
Albumin/Globulin Ratio: 1.7 (ref 1.2–2.2)
Albumin: 4.7 g/dL (ref 3.7–4.7)
Alkaline Phosphatase: 119 IU/L (ref 44–121)
Anion Gap: 18 mmol/L (ref 10.0–18.0)
BUN/Creatinine Ratio: 12 (ref 12–28)
BUN: 9 mg/dL (ref 8–27)
Bilirubin Total: 0.4 mg/dL (ref 0.0–1.2)
CO2: 21 mmol/L (ref 20–29)
Calcium: 9.8 mg/dL (ref 8.7–10.3)
Chloride: 102 mmol/L (ref 96–106)
Creatinine, Ser: 0.76 mg/dL (ref 0.57–1.00)
GFR calc Af Amer: 91 mL/min/{1.73_m2} (ref >59)
GFR calc non Af Amer: 79 mL/min/{1.73_m2} (ref >59)
Globulin, Total: 2.7 g/dL (ref 1.5–4.5)
Glucose: 84 mg/dL (ref 65–99)
Potassium: 4.3 mmol/L (ref 3.5–5.2)
Sodium: 141 mmol/L (ref 134–144)
Total Protein: 7.4 g/dL (ref 6.0–8.5)

## 2020-12-31 LAB — CBC WITH DIFFERENTIAL/PLATELET
Basophils Absolute: 0 10*3/uL (ref 0.0–0.2)
Basos: 1 %
EOS (ABSOLUTE): 0 10*3/uL (ref 0.0–0.4)
Eos: 1 %
Hematocrit: 40.3 % (ref 34.0–46.6)
Hemoglobin: 13.9 g/dL (ref 11.1–15.9)
Immature Grans (Abs): 0 10*3/uL (ref 0.0–0.1)
Immature Granulocytes: 0 %
Lymphocytes Absolute: 1.8 10*3/uL (ref 0.7–3.1)
Lymphs: 32 %
MCH: 30.3 pg (ref 26.6–33.0)
MCHC: 34.5 g/dL (ref 31.5–35.7)
MCV: 88 fL (ref 79–97)
Monocytes Absolute: 0.6 10*3/uL (ref 0.1–0.9)
Monocytes: 11 %
Neutrophils Absolute: 3.1 10*3/uL (ref 1.4–7.0)
Neutrophils: 55 %
Platelets: 258 10*3/uL (ref 150–450)
RBC: 4.58 x10E6/uL (ref 3.77–5.28)
RDW: 13.3 % (ref 11.7–15.4)
WBC: 5.7 10*3/uL (ref 3.4–10.8)

## 2020-12-31 LAB — LIPID PANEL
Chol/HDL Ratio: 3 ratio (ref 0.0–4.4)
Cholesterol, Total: 185 mg/dL (ref 100–199)
HDL: 62 mg/dL (ref >39)
LDL Chol Calc (NIH): 108 mg/dL — ABNORMAL HIGH (ref 0–99)
Triglycerides: 82 mg/dL (ref 0–149)
VLDL Cholesterol Cal: 15 mg/dL (ref 5–40)

## 2020-12-31 NOTE — Assessment & Plan Note (Signed)
Now tolerating Crestor 5 mg, repeat Lipid panel

## 2020-12-31 NOTE — Assessment & Plan Note (Signed)
HPI: Reports she tried sertraline but that caused hot flashes.  We are trying this to see if it would help with anxiety, depression and hot flashes.  Assessment hot flashes, general anxiety disorder, mild major depressive disorder   Discontinue sertraline initiate venlafaxine 37.5 mg daily

## 2020-12-31 NOTE — Assessment & Plan Note (Signed)
HPI complaining of abdominal bloating going on for at least several weeks.  Feels stomach is more full no diarrhea or constipation.  Reports daily bowel movements.  No fever or chills.  No nausea or vomiting. Exam reassuring. She takes multiple multivitamins which I have reconciled myself in her record.  Assessment abdominal bloating  Plan Check CBC and CMP. Discussed with her that magnesium oxide may cause abdominal bloating I would start with trying to discontinue this supplement and see if symptoms improve.

## 2020-12-31 NOTE — Assessment & Plan Note (Signed)
HPI: Reports taking Azor daily no issues she brings medication with her today.  Assessment essential hypertension near goal  Plan Continue Azor 5-40 at this time may need additional blood pressure lowering therapy in the future.  Advised low-salt diet.

## 2021-01-21 DIAGNOSIS — J309 Allergic rhinitis, unspecified: Secondary | ICD-10-CM | POA: Diagnosis not present

## 2021-01-24 ENCOUNTER — Other Ambulatory Visit: Payer: Self-pay

## 2021-01-24 ENCOUNTER — Ambulatory Visit (INDEPENDENT_AMBULATORY_CARE_PROVIDER_SITE_OTHER): Payer: Medicare Other | Admitting: Student

## 2021-01-24 DIAGNOSIS — K529 Noninfective gastroenteritis and colitis, unspecified: Secondary | ICD-10-CM | POA: Insufficient documentation

## 2021-01-24 NOTE — Progress Notes (Signed)
  [facility] Internal Medicine Residency Telephone Encounter Continuity Care Appointment  HPI:   This telephone encounter was created for [name] on 01/24/2021 for the following purpose/cc: diarrhea, abdominal cramping   Past Medical History:  Past Medical History:  Diagnosis Date  . Adrenal incidentaloma ([place]) 07/08/2015   1.0 by 1.4 cm lateral limb left adrenal mass on CT abdomen on 07/08/15 Followed up in April 2018, stable no further imaging required.  . Allergy    seasonal allergies, ace inhibitors--angioedema  . Hyperlipidemia   . Hypertension       ROS:  Review of Systems  Constitutional: Positive for malaise/fatigue. Negative for chills and fever.  HENT: Negative for congestion and sore throat.   Respiratory: Negative for shortness of breath.   Gastrointestinal: Positive for abdominal pain and diarrhea. Negative for blood in stool, constipation, heartburn, melena, nausea and vomiting.  Genitourinary: Negative for dysuria.  Musculoskeletal: Negative for myalgias.  Neurological: Negative for dizziness, weakness and headaches.     Assessment / Plan / Recommendations:   Please see A&P under problem oriented charting for assessment of the patient's acute and chronic medical conditions.   As always, pt is advised that if symptoms worsen or new symptoms arise, they should go to an urgent care facility or to to ER for further evaluation.   Consent and Medical Decision Making:   Patient discussed with Dr. Philipp Ovens  This is a telephone encounter between Eileen Cameron and [place] on 01/24/2021 for diarrhea, abdominal cramping. The visit was conducted with the patient located at home and [place] at [facility]. The patient's identity was confirmed using their DOB and current address. The patient has consented to being evaluated through a telephone encounter and understands the associated risks (an examination cannot be done and the patient may need to come in for an  appointment) / benefits (allows the patient to remain at home, decreasing exposure to coronavirus). I personally spent 15 minutes on medical discussion.

## 2021-01-24 NOTE — Assessment & Plan Note (Signed)
Ms. Venn presents for a telehealth visit with chief complaint of diarrhea and abdominal cramping.  She reports she was in her usual state of health until Saturday around midnight when she developed acute onset profuse, watery diarrhea associated with abdominal cramping. She reports on Friday for dinner she and her daughter went to [employer], and she had a lobster and shrimp sandwich while her daughter had a shrimp sandwich. She notes that her daughter did not get sick. She continued to have episodes of watery diarrhea throughout Saturday, so she took Imodium. On Sunday she was still having diarrhea, however the frequency improved, and today she has had 2 episodes. She reports she has been hydrating with water, Pedialyte, broth. Attempted to eat something solid but had to stop due to diffuse abdominal cramping. She denies fevers, chills, nausea, vomiting, dizziness, weakness, lightheadedness, abdominal distension. She states she had a small amount of blood on her tissue paper after wiping yesterday but otherwise no blood in the stool and no greasy stools. On chart review she was recently seen by her PCP (Dr. Heber [place]) for ongoing bloating, however she reports the discomfort she is currently experiencing is different. She also notes she has a hiatal hernia which seems "irritated" for which she takes a PPI.  Assessment/Plan: Ms. Watterson symptoms are most consistent with acute gastroenteritis, and given her reported history of ingestion of lobster shortly before onset of symptoms, could be food-related. She sounds well and has been able to stay hydrated. Therefore, at this time recommend continuing supportive measures. I will schedule her for follow-up telehealth appointment in 2 days to assess her symptoms. Counseled on return precautions. - continue supportive measures: hydration, rest, advance diet as tolerated - counseled on return precautions, including abdominal distension, worsening diarrhea, fever, blood  in stools - follow-up telehealth appointment in 2 days

## 2021-01-26 ENCOUNTER — Ambulatory Visit: Payer: Medicare Other | Admitting: Student

## 2021-01-26 ENCOUNTER — Other Ambulatory Visit: Payer: Self-pay

## 2021-01-26 DIAGNOSIS — K529 Noninfective gastroenteritis and colitis, unspecified: Secondary | ICD-10-CM

## 2021-01-26 NOTE — Progress Notes (Signed)
Spoke with patient via telephone to follow-up from our telehealth visit 2 days ago (01/24/21). She reports her diarrhea has resolved, last episode early yesterday, and this morning she tolerated solid food without issue. She has continued to hydrate and feels much better overall. Counseled her to keep her previously scheduled appointment with her PCP, Dr. Heber [place].

## 2021-01-27 NOTE — Progress Notes (Signed)
Internal Medicine Clinic Attending  Case discussed with Dr. Shon Baton. Just a follow up on a self limited diarrhea which is doing well. No charge visit.

## 2021-01-31 NOTE — Progress Notes (Signed)
Internal Medicine Clinic Attending  Case discussed with Dr. Watson  At the time of the visit.  We reviewed the resident's history and exam and pertinent patient test results.  I agree with the assessment, diagnosis, and plan of care documented in the resident's note.  

## 2021-02-04 ENCOUNTER — Encounter: Payer: Self-pay | Admitting: *Deleted

## 2021-02-16 ENCOUNTER — Other Ambulatory Visit: Payer: Self-pay

## 2021-02-16 MED ORDER — AMLODIPINE-OLMESARTAN 5-40 MG PO TABS: 1.0000 | ORAL_TABLET | Freq: Every day | ORAL | 3 refills | Status: DC

## 2021-02-16 MED ORDER — FLUTICASONE PROPIONATE 50 MCG/ACT NA SUSP: 2.0000 | Freq: Every day | NASAL | 11 refills | Status: DC

## 2021-02-16 NOTE — Telephone Encounter (Signed)
  fluticasone (FLONASE) 50 MCG/ACT nasal spray  amLODipine-olmesartan (AZOR) 5-40 MG tablet, refill request @  [facility] [id number], [place] AT [place] Phone:  [phone]  Fax:  [phone]

## 2021-03-31 ENCOUNTER — Encounter: Payer: Self-pay | Admitting: Internal Medicine

## 2021-03-31 ENCOUNTER — Ambulatory Visit (INDEPENDENT_AMBULATORY_CARE_PROVIDER_SITE_OTHER): Payer: Medicare Other | Admitting: Internal Medicine

## 2021-03-31 ENCOUNTER — Other Ambulatory Visit: Payer: Self-pay

## 2021-03-31 DIAGNOSIS — R232 Flushing: Secondary | ICD-10-CM

## 2021-03-31 DIAGNOSIS — K219 Gastro-esophageal reflux disease without esophagitis: Secondary | ICD-10-CM | POA: Diagnosis not present

## 2021-03-31 DIAGNOSIS — I10 Essential (primary) hypertension: Secondary | ICD-10-CM | POA: Diagnosis not present

## 2021-03-31 MED ORDER — PANTOPRAZOLE SODIUM 40 MG PO TBEC: 40.0000 mg | DELAYED_RELEASE_TABLET | Freq: Every day | ORAL | 1 refills | Status: DC

## 2021-03-31 MED ORDER — OLMESARTAN-AMLODIPINE-HCTZ 40-5-12.5 MG PO TABS: 1.0000 | ORAL_TABLET | Freq: Every day | ORAL | 3 refills | Status: DC

## 2021-03-31 MED ORDER — VENLAFAXINE HCL ER 37.5 MG PO CP24: 37.5000 mg | ORAL_CAPSULE | Freq: Every day | ORAL | 2 refills | Status: DC

## 2021-03-31 NOTE — Progress Notes (Signed)
  Subjective:  HPI: [name] is a 70 y.o. female who presents for abdominal pain, hot flashes, HTN  Please see Assessment and Plan below for the status of her chronic medical problems.  Objective:  Physical Exam: Vitals:   03/31/21 0941 03/31/21 1021  BP: (!) 173/84 (!) 165/77  Pulse: 87 77  Temp: 98.1 F (36.7 C)   TempSrc: Oral   SpO2: 100%   Weight: 195 lb 1.6 oz (88.5 kg)   Height: 5\' 3"  (1.6 m)    Body mass index is 34.56 kg/m. Physical Exam Constitutional:      Appearance: She is obese.  Cardiovascular:     Rate and Rhythm: Normal rate and regular rhythm.     Heart sounds: Normal heart sounds. No murmur heard.   Pulmonary:     Effort: Pulmonary effort is normal.     Breath sounds: Normal breath sounds.  Abdominal:     General: Abdomen is flat. Bowel sounds are normal.     Palpations: Abdomen is soft.     Tenderness: There is no abdominal tenderness.  Neurological:     Mental Status: She is alert.  Psychiatric:        Mood and Affect: Mood normal.    Assessment & Plan:  See Encounters Tab for problem based charting.  Medications Ordered Meds ordered this encounter  Medications  . pantoprazole (PROTONIX) 40 MG tablet    Sig: Take 1 tablet (40 mg total) by mouth daily.    Dispense:  30 tablet    Refill:  1  . venlafaxine XR (EFFEXOR-XR) 37.5 MG 24 hr capsule    Sig: Take 1 capsule (37.5 mg total) by mouth daily with breakfast.    Dispense:  30 capsule    Refill:  2  . Olmesartan-amLODIPine-HCTZ 40-5-12.5 MG TABS    Sig: Take 1 tablet by mouth daily.    Dispense:  90 tablet    Refill:  3    Replaces Azor, patient request to place on file, would like to finish current supply.   Other Orders No orders of the defined types were placed in this encounter.  Follow Up: Return 3-4 months with Dr Heber Riple[place].

## 2021-03-31 NOTE — Assessment & Plan Note (Signed)
HPI: Still having hot flashes did not realize venlafaxine was needed to be taken daily for hot flashes.  Not sure if it worked when she took it for couple days.  Assessment postmenopausal hot flashes  Plan Discussed that I would like her to try a full 34-month trial of venlafaxine to assess whether it is helping her hot flashes.

## 2021-03-31 NOTE — Assessment & Plan Note (Signed)
HPI: She reports she continues to have some complaints of abdominal cramping and upper abdominal pain at times.  She notes pain returned yesterday after eating spaghetti the night before.  She has not been taking Nexium or Prilosec over-the-counter anymore.  Assessment GERD  Plan Trial of 2 months of Protonix daily.

## 2021-03-31 NOTE — Assessment & Plan Note (Signed)
HPI: She reports she has been taking Azor 5-40 daily no side effects.  Has not been particularly watching salt intake.  No headaches blurry vision.  Assessment essential hypertension above goal  Plan Add hydrochlorothiazide 12.5 mg to her combo pill.

## 2021-05-10 ENCOUNTER — Encounter: Payer: Self-pay | Admitting: *Deleted

## 2021-07-14 ENCOUNTER — Other Ambulatory Visit: Payer: Self-pay

## 2021-07-14 ENCOUNTER — Ambulatory Visit (INDEPENDENT_AMBULATORY_CARE_PROVIDER_SITE_OTHER): Payer: Medicare Other | Admitting: Internal Medicine

## 2021-07-14 ENCOUNTER — Encounter: Payer: Self-pay | Admitting: Internal Medicine

## 2021-07-14 DIAGNOSIS — F411 Generalized anxiety disorder: Secondary | ICD-10-CM | POA: Insufficient documentation

## 2021-07-14 DIAGNOSIS — I10 Essential (primary) hypertension: Secondary | ICD-10-CM | POA: Diagnosis not present

## 2021-07-14 DIAGNOSIS — R232 Flushing: Secondary | ICD-10-CM | POA: Diagnosis not present

## 2021-07-14 NOTE — Assessment & Plan Note (Signed)
She is tolerating her blood pressure medication well with no significant side effects.  Assessment essential hypertension  Plan Continue Azor 5-40 mg daily

## 2021-07-14 NOTE — Assessment & Plan Note (Signed)
HPI: She continues to report generalized anxiety.  She has not taken multiple medications prescribed for anxiety due to fear of side effects.  She is no focal phobias.  We discussed some of the nature of healthy anxiety and unhealthy anxiety.  She acknowledges at times she does have healthy components and at other times her anxiety does prevent her from doing things that she would otherwise like to be able to do.  Assessment GAD  Plan Discussed at this time we will try to avoid medications I will refer her to Dr. Theodis Shove for counseling.  I have encouraged continued physical exercise with her exercise bike!

## 2021-07-14 NOTE — Assessment & Plan Note (Signed)
She still did not take venlafaxine.  Remains too concerned about potential side effects.  In the interim however her hot flashes have improved she is only having an occasional 1 and it is less bothersome.  Her anxiety is still not fully controlled.  We will try to treat this more with continued exercise and referral for therapy given her medication reluctance.

## 2021-07-14 NOTE — Progress Notes (Signed)
  Subjective:  HPI: Ms.Kamyrah Berish is a 70 y.o. female who presents for f/u HTN, Anxiety, Hot flashes  Please see Assessment and Plan below for the status of her chronic medical problems.  Objective:  Physical Exam: Vitals:   07/14/21 1018  BP: (!) 146/75  Pulse: 85  Temp: 98.2 F (36.8 C)  TempSrc: Oral  SpO2: 100%  Weight: 189 lb 6.4 oz (85.9 kg)  Height: '5\' 3"'$  (1.6 m)   Body mass index is 33.55 kg/m. Physical Exam Vitals and nursing note reviewed.  Constitutional:      Appearance: Normal appearance.  Cardiovascular:     Rate and Rhythm: Normal rate and regular rhythm.  Pulmonary:     Effort: Pulmonary effort is normal.     Breath sounds: Normal breath sounds.  Musculoskeletal:     Right lower leg: Edema (1+) present.     Left lower leg: Edema (1+) present.  Neurological:     Mental Status: She is alert.  Psychiatric:        Mood and Affect: Mood normal.   Assessment & Plan:  See Encounters Tab for problem based charting.  Medications Ordered No orders of the defined types were placed in this encounter.  Other Orders Orders Placed This Encounter  Procedures   Ambulatory referral to [place]    Referral Priority:   Routine    Referral Type:   Consultation    Referral Reason:   Specialty Services Required    Referred to Provider:   Donnetta Hutching, LMFT    Number of Visits Requested:   1   Follow Up: Return in about 3 months (around 10/13/2021).

## 2021-07-25 ENCOUNTER — Encounter: Payer: Self-pay | Admitting: Internal Medicine

## 2021-07-29 ENCOUNTER — Other Ambulatory Visit: Payer: Self-pay

## 2021-07-29 DIAGNOSIS — R42 Dizziness and giddiness: Secondary | ICD-10-CM

## 2021-07-29 MED ORDER — MECLIZINE HCL 25 MG PO TABS: 25.0000 mg | ORAL_TABLET | Freq: Three times a day (TID) | ORAL | 0 refills | Status: DC | PRN

## 2021-08-11 ENCOUNTER — Other Ambulatory Visit: Payer: Self-pay

## 2021-08-11 ENCOUNTER — Ambulatory Visit (INDEPENDENT_AMBULATORY_CARE_PROVIDER_SITE_OTHER): Payer: Medicare Other | Admitting: Student

## 2021-08-11 ENCOUNTER — Encounter: Payer: Self-pay | Admitting: Student

## 2021-08-11 DIAGNOSIS — F411 Generalized anxiety disorder: Secondary | ICD-10-CM

## 2021-08-11 MED ORDER — VENLAFAXINE HCL ER 37.5 MG PO CP24: 37.5000 mg | ORAL_CAPSULE | Freq: Every day | ORAL | 1 refills | Status: DC

## 2021-08-11 NOTE — Assessment & Plan Note (Signed)
Reports being anxious for the past year, getting progressively worse. Recently began living by herself and thinks that this may be affecting it. Reports that she is always worrying about so many things (cannot explain, but just randomly begins feeling anxious even when watching tv). States that she feels anxious, nervous, and fearful but not to toward any one particular thing. No SI or HI.  She has been facing this problem and has been seen for it in our clinic before. Sertraline has been attempted, but that caused hot flashes and thus was discontinued. Medication was switched to venlafaxine, but she was concerned about side effects and thus did not pick it up. After a lengthy discussion and going through side effects again, she is willing to try venlafaxine.  Also discussed benefits of healthy exercise including biking and walking, which she enjoys doing. Of note, she also has an upcoming appointment with Dr. Theodis Shove for counseling, which I believe will be greatly helpful for her.  Plan:  -start venlafaxine 37.5mg  daily -upcoming appointment with Dr. Theodis Shove -continue exercising

## 2021-08-11 NOTE — Progress Notes (Signed)
Call placed to patient for intake questions. No answer. Left message on VM requesting return call.

## 2021-08-11 NOTE — Progress Notes (Signed)
Patient returned call. Intake questions completed.

## 2021-08-11 NOTE — Progress Notes (Signed)
Internal Medicine Clinic Attending  Case discussed with Dr. Allyson Sabal at the time of the telephone visit.  We reviewed the resident's history and exam and pertinent patient test results.  I agree with the assessment, diagnosis, and plan of care documented in the resident's note.

## 2021-08-11 NOTE — Progress Notes (Signed)
  [facility] Internal Medicine Residency Telephone Encounter Continuity Care Appointment  HPI:  This telephone encounter was created for [name] on 08/11/2021 for the following purpose/cc: anxiety f/u. Verified identity via DOB and home address.  Reports being anxious for the past year, getting progressively worse. Recently began living by herself and thinks that this may be affecting it. Reports that she is always worrying about so many things (cannot explain, but just randomly begins feeling anxious even when watching tv). States that she feels anxious, nervous, and fearful but not to toward any one particular thing.  She has been facing this problem and has been seen for it in our clinic before. Sertraline has been attempted, but that caused hot flashes and thus was discontinued. Medication was switched to venlafaxine, but she was concerned about side effects and thus did not pick it up. After a lengthy discussion and going through side effects again, she is willing to try venlafaxine. Also discussed benefits of healthy exercise including biking and walking, which she enjoys doing. Of note, she also has an upcoming appointment with Dr. Theodis Shove for counseling, which I believe will be greatly helpful for her. Plan: start venlafaxine 37.5mg  daily, upcoming appointment with Dr. Theodis Shove, continue exercising.   Past Medical History:  Past Medical History:  Diagnosis Date   Adrenal incidentaloma ([place]) 07/08/2015   1.0 by 1.4 cm lateral limb left adrenal mass on CT abdomen on 07/08/15 Followed up in April 2018, stable no further imaging required.   Allergy    seasonal allergies, ace inhibitors--angioedema   Hyperlipidemia    Hypertension      ROS:  Negative aside from that in HPI.   Assessment / Plan / Recommendations:  Please see A&P under problem oriented charting for assessment of the patient's acute and chronic medical conditions.  As always, pt is advised that if symptoms worsen or  new symptoms arise, they should go to an urgent care facility or to to ER for further evaluation.   Consent and Medical Decision Making:  Patient discussed with Dr.  Saverio Danker This is a telephone encounter between Eileen Cameron and Virl Axe on 08/11/2021 for f/u of anxiety. The visit was conducted with the patient located at home and [place] at [facility]. The patient's identity was confirmed using their DOB and current address. The patient has consented to being evaluated through a telephone encounter and understands the associated risks (an examination cannot be done and the patient may need to come in for an appointment) / benefits (allows the patient to remain at home, decreasing exposure to coronavirus). I personally spent 15 minutes on medical discussion.

## 2021-08-15 ENCOUNTER — Ambulatory Visit: Payer: Medicare Other | Admitting: Behavioral Health

## 2021-08-15 DIAGNOSIS — F331 Major depressive disorder, recurrent, moderate: Secondary | ICD-10-CM

## 2021-08-15 DIAGNOSIS — F411 Generalized anxiety disorder: Secondary | ICD-10-CM

## 2021-08-15 NOTE — BH Specialist Note (Signed)
Integrated Behavioral Health via Telemedicine Visit  08/15/2021 Eileen Cameron [id number]  Number of [place] visits: 1/6 Session Start time: 11:00am  Session End time: 11:30am Total time: 30  Referring Provider: Dr. Clinton Sawyer, MD Patient/Family location: Pt is home in private [facility] Provider location: [facility] Office All persons participating in visit: Pt & Clinician Types of Service: Health Promotion and Introduction only  I connected with Alessandra Grout and/or Lyda Perone Pete's  self  via  Telephone or Geologist, engineering  (Video is Tree surgeon) and verified that I am speaking with the correct person using two identifiers. Discussed confidentiality: Yes   I discussed the limitations of telemedicine and the availability of in person appointments.  Discussed there is a possibility of technology failure and discussed alternative modes of communication if that failure occurs.  I discussed that engaging in this telemedicine visit, they consent to the provision of behavioral healthcare and the services will be billed under their insurance.  Patient and/or legal guardian expressed understanding and consented to Telemedicine visit: Yes   Presenting Concerns: Patient and/or family reports the following symptoms/concerns: elevated levels of anxiety since moving our of her Dtr's home into her own Apt. Pt has never lived alone & she feels this has caused the anxiety elevation & panic attacks. Pt does several things to cope. She will sit down & wait for them to pass, read her Bible app, pray, use Google to distract herself to read other things, & she listens to [place].  Pt is actively exercising by walking almost daily & she has a stationary bike she rides. Pt will breathe deeply. She c/o the after effects of the anxiety as, "I don't feel right afterwards. My head feels funny-it throbs."  Pt also requests that others do not tell her the News. She is  trying to prevent from hearing, "all the bad in the world."  Duration of problem: since moving out into her own Apt in the Summer of 2021; Severity of problem: moderate  Patient and/or Family's Strengths/Protective Factors: Social connections, Social and Emotional competence, Concrete supports in place (healthy food, safe environments, etc.), and Sense of purpose. Pt has 5 Siblings who regularly contact her to touch base. Pt is the oldest Sibling.  Pt is active & attends different activities in her Apt Complex & @ an Activity Ctr across the [place].  Pt has several good friends who are neighbors she speaks w/regularly.  Goals Addressed: Patient will:  Reduce symptoms of: anxiety and depression   Increase knowledge and/or ability of: coping skills   Demonstrate ability to: Increase healthy adjustment to current life circumstances and Improve medication compliance  Progress towards Goals: Estb 'd today. Pt provided background info on her Hx of anx/dep & how she is currently coping.   Interventions: Interventions utilized:  Behavioral Activation and Supportive Counseling Standardized Assessments completed:  screeners prn  Patient and/or Family Response: Pt receptive to call today & hopes for future visits as she thinks it is helpful to speak w/someone about your problems.  Assessment: Patient currently experiencing elevated anx rxn to living alone. Pt has recently been given a prescription for Effexor 37.5mg . Pt is hesitant to initiate the medicine. She is waiting on it to be delivered. Pt sts she will try the medication as she wants it to help.  Patient may benefit from bimonthly psychotherapy sessions to ck-in on mental health wellness.  Plan: Follow up with behavioral health clinician on : 2-3 wks on telehealth for 30  min ck-in Behavioral recommendations: Take notes on what is helpful; try holding ice cube to distract your brain from the anxiety, get a smooth stone you can rub, try  using calm.com again. Referral(s): [place] (In Clinic) & use calming activities that keep your brain occupied-anything that is creative will distract your mind & eventually prevent most anxiety attacks.   I discussed the assessment and treatment plan with the patient and/or parent/guardian. They were provided an opportunity to ask questions and all were answered. They agreed with the plan and demonstrated an understanding of the instructions.   They were advised to call back or seek an in-person evaluation if the symptoms worsen or if the condition fails to improve as anticipated.  Donnetta Hutching, LMFT

## 2021-08-16 ENCOUNTER — Ambulatory Visit (INDEPENDENT_AMBULATORY_CARE_PROVIDER_SITE_OTHER): Payer: Medicare Other | Admitting: Student

## 2021-08-16 ENCOUNTER — Other Ambulatory Visit: Payer: Self-pay | Admitting: Student

## 2021-08-16 ENCOUNTER — Encounter: Payer: Self-pay | Admitting: Student

## 2021-08-16 DIAGNOSIS — F411 Generalized anxiety disorder: Secondary | ICD-10-CM | POA: Diagnosis not present

## 2021-08-16 MED ORDER — CITALOPRAM HYDROBROMIDE 10 MG PO TABS: 10.0000 mg | ORAL_TABLET | Freq: Every day | ORAL | 1 refills | Status: DC

## 2021-08-16 MED ORDER — PANTOPRAZOLE SODIUM 40 MG PO TBEC: 40.0000 mg | DELAYED_RELEASE_TABLET | Freq: Every day | ORAL | 1 refills | Status: DC

## 2021-08-16 NOTE — Patient Instructions (Signed)
Ms. Cutbirth,  It was a pleasure seeing you in the clinic today.   Please stop taking venlafaxine. I have prescribed another medication, citalopram, to help with your anxiety. Please take this medication once daily.  Please call our clinic at [phone] if you have any questions or concerns. The best time to call is Monday-Friday from 9am-4pm, but there is someone available 24/7 at the same number. If you need medication refills, please notify your pharmacy one week in advance and they will send [place] a request.   Thank you for letting us take part in your care. We look forward to seeing you next time!

## 2021-08-16 NOTE — Assessment & Plan Note (Signed)
[name] presents today due to medication side effect. She has been dealing with anxiety for the past year and this has recently worsened after she began living by herself. She continues to endorse feeling anxious, nervous, and fearful but not toward any particular thing. Denies any SI or HI.   During our prior telephone encounter, we agreed to trial venlafaxine as patient had hot flashed with sertraline in the past. However, she reports that she took her first dose of venlafaxine today and subsequently began feeling very tired and with no energy. States that she woke up feeling energetic and suddenly became tired after taking this medication and thus would like to change to a different medication.  I provided [name] with 3 potential options for treatment. First, continuing venlafaxine for another few days to see if her side effects improve (she has only taken one dose earlier today and would not expect immediate side effects from medication). Second, changing medication to citalopram as it may not cause the hot flashes she experienced with zoloft (citalopram also appears to be better tolerated by most patients). Third, changing medication to buspirone (2nd line for GAD) to see if a new class would be more effective. We both agreed with the second option and thus will discontinue venlafaxine and initiate therapy with low dose citalopram (10mg  daily).  Plan: -discontinue venlafaxine -initiate citalopram 10mg  daily -continue with exercise and meeting with Dr. Theodis Shove

## 2021-08-16 NOTE — Progress Notes (Signed)
   CC: low energy  HPI:  [name] is a 70 y.o. female with history listed below presenting to the [facility] for low energy/feeling tired after starting venlafaxine earlier today. Please see individualized problem based charting for full HPI.  Past Medical History:  Diagnosis Date  . Adrenal incidentaloma ([place]) 07/08/2015   1.0 by 1.4 cm lateral limb left adrenal mass on CT abdomen on 07/08/15 Followed up in April 2018, stable no further imaging required.  . Allergy    seasonal allergies, ace inhibitors--angioedema  . Hyperlipidemia   . Hypertension     Review of Systems:  Negative aside from that listed in individualized problem based charting.  Physical Exam:  Vitals:   08/16/21 1420  BP: (!) 141/68  Pulse: 96  SpO2: 98%  Weight: 188 lb 11.2 oz (85.6 kg)   Physical Exam Constitutional:      Appearance: She is obese. She is not ill-appearing.  HENT:     Mouth/Throat:     Mouth: Mucous membranes are moist.     Pharynx: Oropharynx is clear. No oropharyngeal exudate.  Eyes:     Extraocular Movements: Extraocular movements intact.     Conjunctiva/sclera: Conjunctivae normal.     Pupils: Pupils are equal, round, and reactive to light.  Cardiovascular:     Rate and Rhythm: Normal rate and regular rhythm.     Pulses: Normal pulses.     Heart sounds: Normal heart sounds. No murmur heard.   No friction rub. No gallop.  Pulmonary:     Effort: Pulmonary effort is normal.     Breath sounds: Normal breath sounds. No wheezing, rhonchi or rales.  Abdominal:     General: Bowel sounds are normal. There is no distension.     Palpations: Abdomen is soft.     Tenderness: There is no abdominal tenderness.  Musculoskeletal:        General: No swelling. Normal range of motion.  Skin:    General: Skin is warm and dry.  Neurological:     General: No focal deficit present.     Mental Status: She is alert and oriented to person, place, and time.  Psychiatric:        Mood and Affect:  Mood normal.        Behavior: Behavior normal.     Assessment & Plan:   See Encounters Tab for problem based charting.  Patient discussed with Dr.  Saverio Danker

## 2021-08-17 NOTE — Progress Notes (Signed)
Internal Medicine Clinic Attending  Case discussed with Dr. Jinwala  At the time of the visit.  We reviewed the resident's history and exam and pertinent patient test results.  I agree with the assessment, diagnosis, and plan of care documented in the resident's note.  

## 2021-08-22 ENCOUNTER — Telehealth: Payer: Self-pay | Admitting: *Deleted

## 2021-08-22 MED ORDER — SERTRALINE HCL 25 MG PO TABS: 25.0000 mg | ORAL_TABLET | Freq: Every day | ORAL | 2 refills | Status: DC

## 2021-08-22 NOTE — Telephone Encounter (Signed)
Spoke with patient, states that after taking celexa she has not been able to sleep well and feels like her anxiety has worsened. Discussed that celexa typically takes 4-6 weeks to exert effects as discussed during our visit. Also provided option of adding trazodone for sleep and anxiety (low dose). She states that she would rather try zoloft again, but at a lower dose than in the past.   She was previously prescribed zoloft 50mg  daily. Agreed to start zoloft at 25mg  for 2 weeks with plan to titrate up to 50mg  after two weeks for therapeutic benefit. Will discontinue celexa at this time.

## 2021-08-22 NOTE — Telephone Encounter (Signed)
Call from pt- stated the pill for anxiety is making her anxiety worse. She's taking Celexa -stated she has been taking it for 1 week. Stated she cannot sleep; she's up throughout the night. She saw Dr Allyson Sabal 08/16/21.

## 2021-09-05 ENCOUNTER — Ambulatory Visit: Payer: Medicare Other | Admitting: Behavioral Health

## 2021-09-06 ENCOUNTER — Telehealth: Payer: Self-pay

## 2021-09-06 NOTE — Telephone Encounter (Signed)
RTC, VM obtained and message left to call nurse triage back if needed. SChaplin, RN,BSN

## 2021-09-06 NOTE — Telephone Encounter (Signed)
Called pt- stated the new medication for anxiety, Sertraline which she started on Sunday, is causing her to be jittery,causing hot flashes. And she continues to have panic attacks at night. Suggested scheduling a telehealth apt to discuss. Call transferred to front office. Appt schedule w/Dr Charleen Kirks 11/2/2@ 5750 PM.

## 2021-09-06 NOTE — Telephone Encounter (Signed)
Questions about medications, please call pt back.  

## 2021-09-07 ENCOUNTER — Ambulatory Visit (INDEPENDENT_AMBULATORY_CARE_PROVIDER_SITE_OTHER): Payer: Medicare Other | Admitting: Internal Medicine

## 2021-09-07 DIAGNOSIS — F411 Generalized anxiety disorder: Secondary | ICD-10-CM

## 2021-09-07 MED ORDER — BUSPIRONE HCL 5 MG PO TABS: 5.0000 mg | ORAL_TABLET | Freq: Two times a day (BID) | ORAL | 0 refills | Status: DC

## 2021-09-07 NOTE — Progress Notes (Deleted)
Attempted to call at 2:56 with no answer. VM left.

## 2021-09-07 NOTE — Assessment & Plan Note (Signed)
Eileen Cameron states she developed excessive jitteriness, worsening hot flashes and panic attacks after starting Sertraline. Symptoms started the same day as taking the tablet and occurred again with the next dose. She note that she has these symptoms at baseline but seemed exaggerated on the Sertraline. She found it difficult to "calm down and even eat due to the racing thoughts." She would like to consider an alternative treatment.   Assessment/Plan: GAD with symptoms consistent with panic disorder. Will discontinue Sertraline as it seems too activating for Eileen Cameron although the timeline of symptoms are a little unclear (symptom onset within hours of taking dose).   - Discontinue Sertraline - Start Buspar 5 mg BID  - Telehealth follow up in 2 weeks

## 2021-09-07 NOTE — Progress Notes (Signed)
  [facility] Internal Medicine Residency Telephone Encounter Continuity Care Appointment  HPI:  This telephone encounter was created for [name] on 09/07/2021 for the following purpose/cc GAD.   Past Medical History:  Past Medical History:  Diagnosis Date   Adrenal incidentaloma ([place]) 07/08/2015   1.0 by 1.4 cm lateral limb left adrenal mass on CT abdomen on 07/08/15 Followed up in April 2018, stable no further imaging required.   Allergy    seasonal allergies, ace inhibitors--angioedema   Hyperlipidemia    Hypertension    Leg cramps 11/18/2015   Mold exposure 01/29/2020   Neck pain, musculoskeletal 12/27/2017   Post-nasal drip 10/10/2016   Right shoulder pain 04/20/2016     ROS:  + panic attacks, anxiety - SI, HI   Assessment / Plan / Recommendations:  Please see A&P under problem oriented charting for assessment of the patient's acute and chronic medical conditions.  As always, pt is advised that if symptoms worsen or new symptoms arise, they should go to an urgent care facility or to to ER for further evaluation.   Consent and Medical Decision Making:  Patient discussed with Dr. Evette Doffing This is a telephone encounter between Eileen Cameron and Jose Persia on 09/07/2021 for GAD. The visit was conducted with the patient located at home and Jose Persia at [facility]. The patient's identity was confirmed using their DOB and current address. The patient has consented to being evaluated through a telephone encounter and understands the associated risks (an examination cannot be done and the patient may need to come in for an appointment) / benefits (allows the patient to remain at home, decreasing exposure to coronavirus). I personally spent 10 minutes on medical discussion.

## 2021-09-08 NOTE — Progress Notes (Signed)
Internal Medicine Clinic Attending  Case discussed with Dr. Basaraba  At the time of the visit.  We reviewed the resident's history and exam and pertinent patient test results.  I agree with the assessment, diagnosis, and plan of care documented in the resident's note.  

## 2021-09-16 ENCOUNTER — Emergency Department (HOSPITAL_COMMUNITY)
Admission: EM | Admit: 2021-09-16 | Discharge: 2021-09-16 | Disposition: A | Discharge status: Home / Self Care | Payer: Medicare Other | Attending: Emergency Medicine | Admitting: Emergency Medicine

## 2021-09-16 DIAGNOSIS — I10 Essential (primary) hypertension: Secondary | ICD-10-CM | POA: Insufficient documentation

## 2021-09-16 DIAGNOSIS — G44219 Episodic tension-type headache, not intractable: Secondary | ICD-10-CM | POA: Diagnosis not present

## 2021-09-16 DIAGNOSIS — R519 Headache, unspecified: Secondary | ICD-10-CM | POA: Diagnosis present

## 2021-09-16 LAB — BASIC METABOLIC PANEL
Anion gap: 11 (ref 5–15)
BUN: 12 mg/dL (ref 8–23)
CO2: 25 mmol/L (ref 22–32)
Calcium: 9.6 mg/dL (ref 8.9–10.3)
Chloride: 102 mmol/L (ref 98–111)
Creatinine, Ser: 1.03 mg/dL — ABNORMAL HIGH (ref 0.44–1.00)
GFR, Estimated: 58 mL/min — ABNORMAL LOW (ref >60)
Glucose, Bld: 100 mg/dL — ABNORMAL HIGH (ref 70–99)
Potassium: 3.4 mmol/L — ABNORMAL LOW (ref 3.5–5.1)
Sodium: 138 mmol/L (ref 135–145)

## 2021-09-16 LAB — CBC WITH DIFFERENTIAL/PLATELET
Abs Immature Granulocytes: 0.01 10*3/uL (ref 0.00–0.07)
Basophils Absolute: 0 10*3/uL (ref 0.0–0.1)
Basophils Relative: 1 %
Eosinophils Absolute: 0 10*3/uL (ref 0.0–0.5)
Eosinophils Relative: 0 %
HCT: 40.7 % (ref 36.0–46.0)
Hemoglobin: 13.3 g/dL (ref 12.0–15.0)
Immature Granulocytes: 0 %
Lymphocytes Relative: 39 %
Lymphs Abs: 1.9 10*3/uL (ref 0.7–4.0)
MCH: 30.7 pg (ref 26.0–34.0)
MCHC: 32.7 g/dL (ref 30.0–36.0)
MCV: 94 fL (ref 80.0–100.0)
Monocytes Absolute: 0.6 10*3/uL (ref 0.1–1.0)
Monocytes Relative: 11 %
Neutro Abs: 2.4 10*3/uL (ref 1.7–7.7)
Neutrophils Relative %: 49 %
Platelets: 279 10*3/uL (ref 150–400)
RBC: 4.33 MIL/uL (ref 3.87–5.11)
RDW: 13.2 % (ref 11.5–15.5)
WBC: 5 10*3/uL (ref 4.0–10.5)
nRBC: 0 % (ref 0.0–0.2)

## 2021-09-16 MED ORDER — KETOROLAC TROMETHAMINE 15 MG/ML IJ SOLN
15.0000 mg | Freq: Once | INTRAMUSCULAR | Status: AC
  Administered 2021-09-16: 15 mg via INTRAVENOUS
  Filled 2021-09-16: qty 1

## 2021-09-16 MED ORDER — SODIUM CHLORIDE 0.9 % IV BOLUS
1000.0000 mL | Freq: Once | INTRAVENOUS | Status: AC
  Administered 2021-09-16: 1000 mL via INTRAVENOUS

## 2021-09-16 MED ORDER — DIPHENHYDRAMINE HCL 50 MG/ML IJ SOLN
12.5000 mg | Freq: Once | INTRAMUSCULAR | Status: AC
  Administered 2021-09-16: 12.5 mg via INTRAVENOUS
  Filled 2021-09-16: qty 1

## 2021-09-16 MED ORDER — METOCLOPRAMIDE HCL 5 MG/ML IJ SOLN
10.0000 mg | Freq: Once | INTRAMUSCULAR | Status: AC
  Administered 2021-09-16: 10 mg via INTRAVENOUS
  Filled 2021-09-16: qty 2

## 2021-09-16 NOTE — ED Provider Notes (Signed)
[place] EMERGENCY DEPARTMENT Provider Note   CSN: [id number] Arrival date & time: 09/16/21  [id number]     History No chief complaint on file.   Eileen Cameron is a 70 y.o. female.  With past medical history of anxiety, hypertension, neck pain who presents emergency department with multiple medical complaints.  She states that ever since Thursday she is had a "bad headache."  She states that it is 9/10, constant, occipital and "feels like a strain."  She denies nausea or vomiting, fevers.  She does state that she has been somewhat lightheaded intermittently over the past 2 days.  She states that she was unable to sleep last night due to the pain.  She denies visual changes, trauma to her head or falls, photophobia, endorses intermittent phonophobia.  She also states "something is making me to feel sick."  She states that she has had decreased appetite.  She denies diarrhea, abdominal pain.  She also states that her legs feel "uncomfortable" on and off which is occurred over months.   HPI     Past Medical History:  Diagnosis Date   Adrenal incidentaloma ([place]) 07/08/2015   1.0 by 1.4 cm lateral limb left adrenal mass on CT abdomen on 07/08/15 Followed up in April 2018, stable no further imaging required.   Allergy    seasonal allergies, ace inhibitors--angioedema   Hyperlipidemia    Hypertension    Leg cramps 11/18/2015   Mold exposure 01/29/2020   Neck pain, musculoskeletal 12/27/2017   Post-nasal drip 10/10/2016   Right shoulder pain 04/20/2016    Patient Active Problem List   Diagnosis Date Noted   Generalized anxiety disorder 07/14/2021   Gastroenteritis 01/24/2021   MDD (major depressive disorder) 08/25/2019   AKI (acute kidney injury) ([place]) 08/10/2019   Sinus infection 03/27/2019   Palpitations 02/18/2018   Visual floaters, bilateral 02/18/2018   Primary osteoarthritis of right knee 02/14/2018   Vertigo 12/27/2017   Obesity (BMI 30.0-34.9) 08/09/2017    Vitamin D insufficiency 08/07/2017   Myalgia due to statin 02/08/2017   Risk for coronary artery disease between 10% and 20% in next 10 years 08/11/2016   History of statin therapy 08/10/2016   Encounter for immunization 07/18/2016   GERD (gastroesophageal reflux disease) 11/28/2014   Insomnia 09/18/2014   Seasonal allergies 12/31/2013   Fatigue due to sleep pattern disturbance 05/13/2013   Hot flashes 04/08/2013   Preventative health care 08/26/2012   Essential hypertension 04/01/2009   Lumbar back pain with radiculopathy affecting right lower extremity 04/01/2009    Past Surgical History:  Procedure Laterality Date   COLONOSCOPY N/A 01/08/2013   Procedure: COLONOSCOPY;  Surgeon: Arta Silence, MD;  Location: [facility];  Service: Endoscopy;  Laterality: N/A;   ECTOPIC PREGNANCY SURGERY  1970s   IR RADIOLOGY PERIPHERAL GUIDED IV START  07/02/2018   IR US GUIDE VASC ACCESS RIGHT  07/02/2018   right breast abscess drainage     TONSILLECTOMY     TUBAL LIGATION       OB History   No obstetric history on file.     Family History  Problem Relation Age of Onset   Heart disease Mother 80   Diabetes Mother    Hypertension Sister    Heart disease Maternal Grandmother    Hypertension Brother    Hypertension Sister    Hypertension Sister    Hypertension Brother     Social History   Tobacco Use   Smoking status: Never   Smokeless  tobacco: Never  Vaping Use   Vaping Use: Never used  Substance Use Topics   Alcohol use: No    Alcohol/week: 0.0 standard drinks   Drug use: No    Home Medications Prior to Admission medications   Medication Sig Start Date End Date Taking? Authorizing Provider  busPIRone (BUSPAR) 5 MG tablet Take 1 tablet (5 mg total) by mouth 2 (two) times daily. 09/07/21   Jose Persia, MD  Cholecalciferol (VITAMIN D3 PO) Take 2 tablets by mouth daily.    [provider]  cyclobenzaprine (FLEXERIL) 5 MG tablet Take 10 mg by mouth 3 (three) times  daily as needed for muscle spasms. 07/21/19   [provider]  diclofenac Sodium (VOLTAREN) 1 % GEL Apply 4 g topically 4 (four) times daily. 03/11/20   Lucious Groves, DO  fluticasone (FLONASE) 50 MCG/ACT nasal spray Place 2 sprays into both nostrils daily. 02/16/21 02/16/22  Aldine Contes, MD  meclizine (ANTIVERT) 25 MG tablet Take 1 tablet (25 mg total) by mouth 3 (three) times daily as needed for dizziness. 07/29/21   Lucious Groves, DO  Olmesartan-amLODIPine-HCTZ 40-5-12.5 MG TABS Take 1 tablet by mouth daily. 03/31/21   Lucious Groves, DO  Omega-3 Fatty Acids (FISH OIL PO) Take 1 capsule by mouth daily.    [provider]  pantoprazole (PROTONIX) 40 MG tablet TAKE 1 TABLET(40 MG) BY MOUTH DAILY 08/17/21   Virl Axe, MD  pyridoxine (B-6) 100 MG tablet Take 100 mg by mouth daily.    [provider]  rosuvastatin (CRESTOR) 5 MG tablet Take 1 tablet (5 mg total) by mouth daily. Patient not taking: No sig reported 06/24/20   Lucious Groves, DO  vitamin C (ASCORBIC ACID) 500 MG tablet Take 500 mg by mouth daily.    [provider]  zinc gluconate 50 MG tablet Take 50 mg by mouth every other day.    [provider]    Allergies    Ace inhibitors  Review of Systems   Review of Systems  Constitutional:  Positive for appetite change. Negative for fever.  Eyes:  Negative for photophobia.  Respiratory:  Negative for cough and shortness of breath.   Cardiovascular:  Negative for chest pain.  Gastrointestinal:  Negative for abdominal pain, nausea and vomiting.  Genitourinary:  Negative for dysuria.  Musculoskeletal:  Positive for myalgias.  Neurological:  Positive for light-headedness. Negative for dizziness and syncope.  Psychiatric/Behavioral:  The patient is nervous/anxious.   All other systems reviewed and are negative.  Physical Exam Updated Vital Signs BP (!) 170/71 (BP Location: Left Arm)   Pulse 80   Temp 98.7 F (37.1 C) (Oral)    Resp 18   SpO2 98%   Physical Exam Vitals and nursing note reviewed.  Constitutional:      General: She is not in acute distress.    Appearance: Normal appearance. She is not toxic-appearing.  HENT:     Head: Normocephalic and atraumatic.     Nose: Nose normal.     Mouth/Throat:     Mouth: Mucous membranes are moist.     Pharynx: Oropharynx is clear.  Eyes:     General: No visual field deficit or scleral icterus.    Extraocular Movements: Extraocular movements intact.     Pupils: Pupils are equal, round, and reactive to light.  Cardiovascular:     Rate and Rhythm: Normal rate and regular rhythm.     Pulses: Normal pulses.  Heart sounds: No murmur heard. Pulmonary:     Effort: Pulmonary effort is normal. No respiratory distress.     Breath sounds: Normal breath sounds.  Abdominal:     General: Bowel sounds are normal. There is no distension.     Palpations: Abdomen is soft.  Musculoskeletal:        General: Normal range of motion.     Cervical back: Normal range of motion and neck supple.  Skin:    General: Skin is warm and dry.     Capillary Refill: Capillary refill takes less than 2 seconds.  Neurological:     General: No focal deficit present.     Mental Status: She is alert and oriented to person, place, and time. Mental status is at baseline.     Cranial Nerves: No cranial nerve deficit, dysarthria or facial asymmetry.     Sensory: No sensory deficit.     Motor: No weakness or pronator drift.     Coordination: Finger-Nose-Finger Test normal. Rapid alternating movements normal.  Psychiatric:        Mood and Affect: Mood normal.        Behavior: Behavior normal.        Thought Content: Thought content normal.        Judgment: Judgment normal.    ED Results / Procedures / Treatments   Labs (all labs ordered are listed, but only abnormal results are displayed) Labs Reviewed  BASIC METABOLIC PANEL - Abnormal; Notable for the following components:      Result  Value   Potassium 3.4 (*)    Glucose, Bld 100 (*)    Creatinine, Ser 1.03 (*)    GFR, Estimated 58 (*)    All other components within normal limits  CBC WITH DIFFERENTIAL/PLATELET   EKG None  Radiology No results found.  Procedures Procedures   Medications Ordered in ED Medications  ketorolac (TORADOL) 15 MG/ML injection 15 mg (15 mg Intravenous Given 09/16/21 1238)  metoCLOPramide (REGLAN) injection 10 mg (10 mg Intravenous Given 09/16/21 1233)  diphenhydrAMINE (BENADRYL) injection 12.5 mg (12.5 mg Intravenous Given 09/16/21 1233)  sodium chloride 0.9 % bolus 1,000 mL (1,000 mLs Intravenous New Bag/Given 09/16/21 1227)    ED Course  I have reviewed the triage vital signs and the nursing notes.  Pertinent labs & imaging results that were available during my care of the patient were reviewed by me and considered in my medical decision making (see chart for details).    MDM Rules/Calculators/A&P 70 year old female presents emergency department with headache.  Presentation is most consistent with a benign headache either tension versus migraine.  She has no headache red flags.  Her neurological exam is without evidence of meningismus, altered mental status, focal neurological findings so doubt meningitis, encephalitis, stroke.  Presentation is not consistent with an acute intracranial bleed.  She has no history of trauma so I doubt ICH.  Given her history and physical exam temporal arteritis is unlikely.  Doubt carotid artery dissection, again no new neurodeficits, neck trauma or neck strain.  No sign of increased intracranial pressure or weight loss and history and physical suggest more of a benign rather than mass-effect from tumor or abscess or idiopathic intracranial hypertension. She has no carotid bruit on exam Neuro exam is unremarkable.  No focal deficits, no nystagmus, rapid alternating movements intact, nose to finger intact bilaterally. Given headache cocktail with relief  of symptoms. Labs within normal limits.  Instructed her to return to the emergency department  should she begin to have increasing headache associate with nausea or vomiting, fevers.  She verbalizes understanding.  I have also discussed her following up with her primary care provider about her anxiety.  She states that she was recently started on BuSpar and I educated her that it may take a couple of weeks for this medication to start working.  In the meantime she should perhaps call her primary care provider about having as needed medication for her panic attacks.  She verbalizes understanding.  Safe for discharge Final Clinical Impression(s) / ED Diagnoses Final diagnoses:  Episodic tension-type headache, not intractable    Rx / DC Orders ED Discharge Orders     None        Mickie Hillier, PA-C 09/16/21 1457    Pattricia Boss, MD 09/18/21 [phone]

## 2021-09-16 NOTE — ED Provider Notes (Signed)
Eileen Cameron, a 70 y.o. female  was evaluated in triage.  Pt complains of neck pain, fatigue, anxiety and weakness.  She is concerned that there is a medical problem in her body that is causing anxiety.   Review of Systems  Positive:          As above Negative:         Fever or chills   Physical Exam  BP 137/84 (BP Location: Right Arm)   Pulse 72   Temp 99 F (37.2 C) (Oral)   Resp 20   SpO2 100%  Gen:                Awake, no distress   Resp:               Normal effort  MSK:               Moves extremities without difficulty  Other:                 Medical Decision Making  Medically screening exam initiated at 9:15 AM.  Appropriate orders placed.  Arietha L Brunson was informed that the remainder of the evaluation will be completed by another provider, this initial triage assessment does not replace that evaluation, and the importance of remaining in the ED until their evaluation is complete.   Rhae Hammock, PA-C 09/16/21 3013    Charlesetta Shanks, MD 09/16/21 1005

## 2021-09-16 NOTE — Discharge Instructions (Addendum)
You were seen in the emergency department today for headache.  While you are here we did lab work which was normal.  We gave you a headache cocktail which included pain medication and nausea medication and fluids which improved your symptoms.  It is likely that you are having a migraine type headache.  Please follow-up with your primary care provider in a week regarding your emergency department visit and obtaining as needed medication for anxiety as you state you are having panic attacks.  Please return to emergency department if you begin to have increasing headache with nausea or vomiting or fevers.

## 2021-09-16 NOTE — ED Triage Notes (Signed)
Pt with headache, leg pain, sore throat, and lack of appetite x 3 days.

## 2021-09-16 NOTE — ED Notes (Signed)
Pt ambulatory to the from restroom with steady gait

## 2021-09-17 ENCOUNTER — Emergency Department (HOSPITAL_COMMUNITY)
Admission: EM | Admit: 2021-09-17 | Discharge: 2021-09-18 | Disposition: A | Discharge status: Home / Self Care | Payer: Medicare Other | Attending: Medical | Admitting: Medical

## 2021-09-17 ENCOUNTER — Other Ambulatory Visit: Payer: Self-pay

## 2021-09-17 ENCOUNTER — Encounter (HOSPITAL_COMMUNITY): Payer: Self-pay | Admitting: *Deleted

## 2021-09-17 DIAGNOSIS — I10 Essential (primary) hypertension: Secondary | ICD-10-CM | POA: Diagnosis not present

## 2021-09-17 DIAGNOSIS — Z5321 Procedure and treatment not carried out due to patient leaving prior to being seen by health care provider: Secondary | ICD-10-CM | POA: Diagnosis not present

## 2021-09-17 DIAGNOSIS — R002 Palpitations: Secondary | ICD-10-CM | POA: Insufficient documentation

## 2021-09-17 DIAGNOSIS — F419 Anxiety disorder, unspecified: Secondary | ICD-10-CM | POA: Insufficient documentation

## 2021-09-17 HISTORY — DX: Anxiety disorder, unspecified: F41.9

## 2021-09-17 LAB — CBC WITH DIFFERENTIAL/PLATELET
Abs Immature Granulocytes: 0.02 10*3/uL (ref 0.00–0.07)
Basophils Absolute: 0 10*3/uL (ref 0.0–0.1)
Basophils Relative: 1 %
Eosinophils Absolute: 0 10*3/uL (ref 0.0–0.5)
Eosinophils Relative: 1 %
HCT: 39.6 % (ref 36.0–46.0)
Hemoglobin: 13.5 g/dL (ref 12.0–15.0)
Immature Granulocytes: 0 %
Lymphocytes Relative: 44 %
Lymphs Abs: 2.7 10*3/uL (ref 0.7–4.0)
MCH: 31 pg (ref 26.0–34.0)
MCHC: 34.1 g/dL (ref 30.0–36.0)
MCV: 91 fL (ref 80.0–100.0)
Monocytes Absolute: 0.6 10*3/uL (ref 0.1–1.0)
Monocytes Relative: 10 %
Neutro Abs: 2.7 10*3/uL (ref 1.7–7.7)
Neutrophils Relative %: 44 %
Platelets: 272 10*3/uL (ref 150–400)
RBC: 4.35 MIL/uL (ref 3.87–5.11)
RDW: 12.9 % (ref 11.5–15.5)
WBC: 6.1 10*3/uL (ref 4.0–10.5)
nRBC: 0 % (ref 0.0–0.2)

## 2021-09-17 LAB — COMPREHENSIVE METABOLIC PANEL
ALT: 11 U/L (ref 0–44)
AST: 22 U/L (ref 15–41)
Albumin: 4.1 g/dL (ref 3.5–5.0)
Alkaline Phosphatase: 69 U/L (ref 38–126)
Anion gap: 11 (ref 5–15)
BUN: 11 mg/dL (ref 8–23)
CO2: 25 mmol/L (ref 22–32)
Calcium: 9.5 mg/dL (ref 8.9–10.3)
Chloride: 97 mmol/L — ABNORMAL LOW (ref 98–111)
Creatinine, Ser: 1 mg/dL (ref 0.44–1.00)
GFR, Estimated: 60 mL/min — ABNORMAL LOW (ref >60)
Glucose, Bld: 108 mg/dL — ABNORMAL HIGH (ref 70–99)
Potassium: 3.3 mmol/L — ABNORMAL LOW (ref 3.5–5.1)
Sodium: 133 mmol/L — ABNORMAL LOW (ref 135–145)
Total Bilirubin: 0.8 mg/dL (ref 0.3–1.2)
Total Protein: 7.2 g/dL (ref 6.5–8.1)

## 2021-09-17 MED ORDER — LORAZEPAM 1 MG PO TABS
0.5000 mg | ORAL_TABLET | Freq: Once | ORAL | Status: AC
  Administered 2021-09-17: 0.5 mg via ORAL
  Filled 2021-09-17: qty 1

## 2021-09-17 NOTE — ED Provider Notes (Signed)
Emergency Medicine Provider Triage Evaluation Note  Eileen Cameron , a 70 y.o. female  was evaluated in triage.  Pt complains of anxiety and feeling jittery.  Patient states that she has a history of anxiety and takes BuSpar as needed.  She notes that yesterday she came to the ED for a headache.  She was treated with a headache cocktail including Toradol, Reglan, Benadryl.  She states when she went home she felt like her heart was racing.  This morning she woke up and felt jittery and extremely anxious.  She states she did not take her BuSpar earlier today.  She is noted to be hypertensive in the ED at 188/91.  She states that she took her blood pressure medicine this morning.  States her headache feels better.  She has been dealing with headaches for approximately 6 months.  Review of Systems  Positive: + anxiety, heart palpitations Negative: - chest pain, SOB, syncope  Physical Exam  BP (!) 188/91 (BP Location: Left Arm)   Pulse 82   Temp 98.5 F (36.9 C) (Oral)   Resp 16   SpO2 97%  Gen:   Awake, no distress   Resp:  Normal effort  MSK:   Moves extremities without difficulty  Other:  RRR  Medical Decision Making  Medically screening exam initiated at 4:26 PM.  Appropriate orders placed.  Eileen Cameron was informed that the remainder of the evaluation will be completed by another provider, this initial triage assessment does not replace that evaluation, and the importance of remaining in the ED until their evaluation is complete.  Offered Benadryl I suspect her jitteriness is secondary to Reglan from yesterday however patient declines.  She states she feels like Benadryl also makes her feel anxious.  We will give a half dose of Ativan and obtain EKG and repeat labs at this time.    Eustaquio Maize, PA-C 09/17/21 1629    Godfrey Pick, MD 09/18/21 [phone]

## 2021-09-17 NOTE — ED Triage Notes (Signed)
Pt states she was given meds in the ED for a headache yesterday, and woke up in the middle of night feeling very anxious.  Pt was given benadryl and reglan.  PT sates she already has a hx of anxiety and feels this is getting worse.

## 2021-09-21 ENCOUNTER — Ambulatory Visit (INDEPENDENT_AMBULATORY_CARE_PROVIDER_SITE_OTHER): Payer: Medicare Other | Admitting: Internal Medicine

## 2021-09-21 ENCOUNTER — Other Ambulatory Visit: Payer: Self-pay

## 2021-09-21 ENCOUNTER — Encounter: Payer: Self-pay | Admitting: Internal Medicine

## 2021-09-21 VITALS — BP 160/71 | HR 80 | Temp 97.9°F | Ht 63.0 in | Wt 178.6 lb

## 2021-09-21 DIAGNOSIS — F411 Generalized anxiety disorder: Secondary | ICD-10-CM | POA: Diagnosis not present

## 2021-09-21 DIAGNOSIS — K219 Gastro-esophageal reflux disease without esophagitis: Secondary | ICD-10-CM

## 2021-09-21 DIAGNOSIS — I10 Essential (primary) hypertension: Secondary | ICD-10-CM | POA: Diagnosis not present

## 2021-09-21 DIAGNOSIS — R519 Headache, unspecified: Secondary | ICD-10-CM | POA: Insufficient documentation

## 2021-09-21 MED ORDER — ESOMEPRAZOLE MAGNESIUM 20 MG PO CPDR: 20.0000 mg | DELAYED_RELEASE_CAPSULE | Freq: Every day | ORAL | 2 refills | Status: DC

## 2021-09-21 MED ORDER — ESCITALOPRAM OXALATE 10 MG PO TABS: 10.0000 mg | ORAL_TABLET | Freq: Every day | ORAL | 2 refills | Status: DC

## 2021-09-21 NOTE — Assessment & Plan Note (Addendum)
Patient endorses some reflux symptoms today.  Pantoprazole is listed as a medication in her chart, however she reports that she takes Nexium sometimes.  P: We will continue Nexium regimen at this time.  Emphasized the need to take this every day for 1 month, and we will reassess at her next visit on 12/22.

## 2021-09-21 NOTE — Assessment & Plan Note (Signed)
The patient is here today mainly for concerns for anxiety.  Most recently, she switched to BuSpar 5 mg daily because sertraline may have been too activating for her.  She states that she took sertraline for 2 days and felt even more anxious and can continue taking it.  She also felt this way with BuSpar after taking it for 2 days.  She has also tried paroxetine, venlafaxine, and citalopram.  She is scheduled to see behavioral health on the 23rd here.  P: Discontinue BuSpar, start Lexapro 10 mg daily.  Emphasized the need to take this for a full month at least to see full effects of the medication.  Patient will follow-up with behavioral health on 11/23.

## 2021-09-21 NOTE — Assessment & Plan Note (Signed)
Continue olemsartan-amlodipine-HCTZ 40-5-12.5mg  daily.

## 2021-09-21 NOTE — Progress Notes (Signed)
   CC: ED follow-up   HPI:  [name] is a 70 y.o. with past medical history as noted below who presents to the clinic today for ED follow-up. Please see problem-based list for further details, assessments, and plans.  Past Medical History:  Diagnosis Date   Adrenal incidentaloma ([place]) 07/08/2015   1.0 by 1.4 cm lateral limb left adrenal mass on CT abdomen on 07/08/15 Followed up in April 2018, stable no further imaging required.   Allergy    seasonal allergies, ace inhibitors--angioedema   Anxiety    Hyperlipidemia    Hypertension    Leg cramps 11/18/2015   Mold exposure 01/29/2020   Neck pain, musculoskeletal 12/27/2017   Post-nasal drip 10/10/2016   Right shoulder pain 04/20/2016   Review of Systems:  Review of Systems  Constitutional: Negative.   HENT: Negative.  Negative for hearing loss.   Eyes: Negative.  Negative for blurred vision.  Respiratory: Negative.    Cardiovascular: Negative.   Gastrointestinal:  Positive for heartburn.  Genitourinary: Negative.   Musculoskeletal: Negative.   Skin: Negative.   Neurological:  Positive for headaches. Negative for sensory change and weakness.  Endo/Heme/Allergies: Negative.   Psychiatric/Behavioral:  The patient is nervous/anxious.     Physical Exam:  Vitals:   09/21/21 1053  BP: (!) 160/71  Pulse: 80  Temp: 97.9 F (36.6 C)  TempSrc: Oral  SpO2: 98%  Weight: 178 lb 9.6 oz (81 kg)  Height: 5\' 3"  (1.6 m)   Physical Exam Constitutional:      Comments: anxious  HENT:     Head: Normocephalic and atraumatic.  Eyes:     Extraocular Movements: Extraocular movements intact.     Pupils: Pupils are equal, round, and reactive to light.  Cardiovascular:     Rate and Rhythm: Normal rate and regular rhythm.     Heart sounds: No murmur heard.   No friction rub. No gallop.  Pulmonary:     Effort: Pulmonary effort is normal. No respiratory distress.     Breath sounds: Normal breath sounds. No wheezing, rhonchi or  rales.  Abdominal:     General: Abdomen is flat. There is no distension.     Palpations: Abdomen is soft.  Musculoskeletal:        General: No swelling or deformity.  Skin:    General: Skin is warm and dry.  Neurological:     General: No focal deficit present.     Mental Status: She is alert and oriented to person, place, and time. Mental status is at baseline.     Cranial Nerves: No cranial nerve deficit.     Sensory: No sensory deficit.     Motor: No weakness.     Comments: Sensation intact.  5/5 strength in BUE and BLE  Psychiatric:        Mood and Affect: Mood normal.        Behavior: Behavior normal.     Assessment & Plan:   See Encounters Tab for problem based charting.  Patient seen with Dr. Heber [place]

## 2021-09-21 NOTE — Patient Instructions (Signed)
Thank you, [name] for allowing [place] to provide your care today. Today we discussed your headaches, anxiety, and reflux.  1) Anxiety - Lexapro 10 mg daily. Please take this every day for a month and follow-up Dec 22 to discuss this further.  2) Headaches - You may take over the counter Advil or Aleve since tylenol has not provided relief. I have referred you to Neurology  3) Reflux - please take nexium as prescribed for 1 month every day and we will revisit this at your next appointment.  I have ordered the following labs for you:  Lab Orders  No laboratory test(s) ordered today    Referrals ordered today:    Referral Orders         Ambulatory referral to Neurology      I have ordered the following medication/changed the following medications:   Stop the following medications: There are no discontinued medications.   Start the following medications: Meds ordered this encounter  Medications   escitalopram (LEXAPRO) 10 MG tablet    Sig: Take 1 tablet (10 mg total) by mouth daily.    Dispense:  30 tablet    Refill:  2   esomeprazole (NEXIUM) 20 MG capsule    Sig: Take 1 capsule (20 mg total) by mouth daily.    Dispense:  30 capsule    Refill:  2     Follow up:  10/27/21 as scheduled     Should you have any questions or concerns please call the internal medicine clinic at [phone].

## 2021-09-21 NOTE — Assessment & Plan Note (Addendum)
Patient was seen in the ED on 11/11 for headache located in the occipital region sometimes radiating to the front of her head.  At that time, headache was determined to be migraine versus tension headache, and she was given a headache cocktail prior to discharge.  She is here for ED follow-up.  The patient continues to have headaches in these regions.  They occur intermittently.  Per her son-in-law, they have been occurring for the past 6 months.  She has tried Tylenol in the past for pain control, however this does not alleviate her pain.  She states that the headaches make her feel quite anxious as well.  Patient is also requesting referral to neurology.  P: We have referred the patient to neurology, commenting that we would like a headache specialist for her.  Patient can take Advil/Aleve as needed.  She is not anemic, last colonoscopy WNL, instructed to take Nexium every day for the next month.  Follow-up in 1 month (12/22).

## 2021-09-23 ENCOUNTER — Ambulatory Visit (INDEPENDENT_AMBULATORY_CARE_PROVIDER_SITE_OTHER): Payer: Medicare Other | Admitting: Student

## 2021-09-23 ENCOUNTER — Telehealth: Payer: Self-pay

## 2021-09-23 DIAGNOSIS — F411 Generalized anxiety disorder: Secondary | ICD-10-CM

## 2021-09-23 NOTE — Progress Notes (Signed)
Internal Medicine Clinic Attending ? ?I saw and evaluated the patient.  I personally confirmed the key portions of the history and exam documented by Dr. Bonanno and I reviewed pertinent patient test results.  The assessment, diagnosis, and plan were formulated together and I agree with the documentation in the resident?s note. ? ?

## 2021-09-23 NOTE — Telephone Encounter (Signed)
Questions about meds. Please call pt back.

## 2021-09-23 NOTE — Telephone Encounter (Signed)
RTC to patient stated took the newly prescribed Lexapto yesterday morning.  Last night was confused .  Was unable to sleep last night due to Panic Attacks.  Having Shaky Spells.  Patient is complaining of increased Panic Attacks. Requesting to speak to MD about Panic Attacks medication.  Patient given Tele-Health with o/c physician this afternoon.  Sander Nephew, RN 09/23/2021 9:58 AM

## 2021-09-23 NOTE — Progress Notes (Signed)
  [facility] Internal Medicine Residency Telephone Encounter Continuity Care Appointment  HPI:  This telephone encounter was created for [name] on 09/23/2021 for the following purpose/cc potential medication side effect   Past Medical History:  Past Medical History:  Diagnosis Date   Adrenal incidentaloma ([place]) 07/08/2015   1.0 by 1.4 cm lateral limb left adrenal mass on CT abdomen on 07/08/15 Followed up in April 2018, stable no further imaging required.   Allergy    seasonal allergies, ace inhibitors--angioedema   Anxiety    Hyperlipidemia    Hypertension    Leg cramps 11/18/2015   Mold exposure 01/29/2020   Neck pain, musculoskeletal 12/27/2017   Post-nasal drip 10/10/2016   Right shoulder pain 04/20/2016     ROS:  As per HPI   Assessment / Plan / Recommendations:  Please see A&P under problem oriented charting for assessment of the patient's acute and chronic medical conditions.  As always, pt is advised that if symptoms worsen or new symptoms arise, they should go to an urgent care facility or to to ER for further evaluation.   Consent and Medical Decision Making:  Patient discussed with Dr. Jimmye Norman This is a telephone encounter between Eileen Cameron and Sanjuan Dame on 09/23/2021 for panic attack. The visit was conducted with the patient located at home and Sanjuan Dame at [facility]. The patient's identity was confirmed using their DOB and current address. The patient has consented to being evaluated through a telephone encounter and understands the associated risks (an examination cannot be done and the patient may need to come in for an appointment) / benefits (allows the patient to remain at home, decreasing exposure to coronavirus). I personally spent 8 minutes on medical discussion.

## 2021-09-25 NOTE — Assessment & Plan Note (Signed)
Patient is presenting via telephone today to discuss potential side effects from previously started medication. Patient was seen in our clinic on 11/16 and was started on Lexapro. Patient reports a few hours after she began this medication last night she started to experience mild confusion. She states that she lives alone and knew where she was, but felt like she was in a "brain fog." Patient subsequently laid down for the evening. Patient reports she was unable to sleep much during the night due to anxiety. Today, however, patient states she feels like she could be sick, as she is extremely tired and does not feel like leaving her bedroom. She denies fevers, chills, dyspnea, chest pain, dysuria, polyuria, abdominal pain, diarrhea. Denies current SI or HI.  Discussed with patient that she could have an underlying illness vs potential medication side effect. I have instructed Ms. Alligood to hold off taking Lexapro through the next few days and return to clinic next week for further evaluation. Of note, patient has been trialed on multiple SSRI's, including paroxetine, citalopram, sertraline, and venlafaxine. Patient could benefit from psychiatry referral for further assistance.   - Hold Lexapro - Follow-up with [facility] next week

## 2021-09-27 ENCOUNTER — Encounter: Payer: Self-pay | Admitting: Internal Medicine

## 2021-09-27 ENCOUNTER — Other Ambulatory Visit: Payer: Self-pay

## 2021-09-27 ENCOUNTER — Ambulatory Visit (INDEPENDENT_AMBULATORY_CARE_PROVIDER_SITE_OTHER): Payer: Medicare Other | Admitting: Internal Medicine

## 2021-09-27 DIAGNOSIS — F411 Generalized anxiety disorder: Secondary | ICD-10-CM

## 2021-09-27 DIAGNOSIS — I10 Essential (primary) hypertension: Secondary | ICD-10-CM | POA: Diagnosis not present

## 2021-09-27 DIAGNOSIS — K219 Gastro-esophageal reflux disease without esophagitis: Secondary | ICD-10-CM

## 2021-09-27 DIAGNOSIS — R519 Headache, unspecified: Secondary | ICD-10-CM

## 2021-09-27 MED ORDER — MIRTAZAPINE 7.5 MG PO TABS: 7.5000 mg | ORAL_TABLET | Freq: Every day | ORAL | 2 refills | Status: DC

## 2021-09-27 NOTE — Progress Notes (Signed)
Internal Medicine Clinic Attending  I saw and evaluated the patient.  I personally confirmed the key portions of the history and exam documented by Dr. Braswell and I reviewed pertinent patient test results.  The assessment, diagnosis, and plan were formulated together and I agree with the documentation in the resident's note.  

## 2021-09-27 NOTE — Assessment & Plan Note (Addendum)
Advised to start taking Nexium on last office visit 11/16. Reports good adherence with no complaints today. Suspect GERD contributing to feelings of anxiety and chest discomfort.   Continue Nexium

## 2021-09-27 NOTE — Assessment & Plan Note (Signed)
Has a consultation appt with neurology/ Dr Eunice Blase on 11/28/21.

## 2021-09-27 NOTE — Patient Instructions (Signed)
Thank you, Ms.Keairra Sabra Heck for allowing [place] to provide your care today!  Today we discussed:  Anxiety:  Please start taking mirtazapine 7.5 mg at bedtime. You can start taking 2 a night after 2 weeks if you feel like it is working well.     I will call you if any labs, tests, or imaging results require any further attention or action.   I have ordered the following labs for you:  Lab Orders  No laboratory test(s) ordered today     Referrals ordered today:   Referral Orders  No referral(s) requested today     Medications ordered or changed:  Stop the following medications: Medications Discontinued During This Encounter  Medication Reason   escitalopram (LEXAPRO) 10 MG tablet Change in therapy     Start the following medications: Meds ordered this encounter  Medications   mirtazapine (REMERON) 7.5 MG tablet    Sig: Take 1 tablet (7.5 mg total) by mouth at bedtime.    Dispense:  30 tablet    Refill:  2     Follow up in: 4 weeks    Should you have any questions or concerns please call the internal medicine clinic at [phone].     Lajean Manes, MD  Internal Medicine Resident, PGY-1 [facility]

## 2021-09-27 NOTE — Progress Notes (Signed)
CC: acute follow up visit for uncontrolled anxiety   HPI:  [name] is a 70 y.o. female with a PMHx stated below and presents today for stated above. Please see the Encounters tab for problem-based Assessment & Plan for additional details.   Past Medical History:  Diagnosis Date   Adrenal incidentaloma ([place]) 07/08/2015   1.0 by 1.4 cm lateral limb left adrenal mass on CT abdomen on 07/08/15 Followed up in April 2018, stable no further imaging required.   Allergy    seasonal allergies, ace inhibitors--angioedema   Anxiety    Hyperlipidemia    Hypertension    Leg cramps 11/18/2015   Mold exposure 01/29/2020   Neck pain, musculoskeletal 12/27/2017   Post-nasal drip 10/10/2016   Right shoulder pain 04/20/2016    Current Outpatient Medications on File Prior to Visit  Medication Sig Dispense Refill   Cholecalciferol (VITAMIN D3 PO) Take 1 tablet by mouth daily.     diclofenac Sodium (VOLTAREN) 1 % GEL Apply 4 g topically 4 (four) times daily. 100 g 3   escitalopram (LEXAPRO) 10 MG tablet Take 1 tablet (10 mg total) by mouth daily. 30 tablet 2   esomeprazole (NEXIUM) 20 MG capsule Take 1 capsule (20 mg total) by mouth daily. 30 capsule 2   fluticasone (FLONASE) 50 MCG/ACT nasal spray Place 2 sprays into both nostrils daily. (Patient taking differently: Place 2 sprays into both nostrils daily as needed for allergies.) 16 g 11   meclizine (ANTIVERT) 25 MG tablet Take 1 tablet (25 mg total) by mouth 3 (three) times daily as needed for dizziness. 30 tablet 0   Olmesartan-amLODIPine-HCTZ 40-5-12.5 MG TABS Take 1 tablet by mouth daily. 90 tablet 3   Omega-3 Fatty Acids (FISH OIL PO) Take 1 capsule by mouth daily.     pantoprazole (PROTONIX) 40 MG tablet TAKE 1 TABLET(40 MG) BY MOUTH DAILY (Patient taking differently: Take 40 mg by mouth daily as needed (acid/heartburn).) 90 tablet 1   pyridoxine (B-6) 100 MG tablet Take 100 mg by mouth daily.     rosuvastatin (CRESTOR) 5 MG tablet Take  1 tablet (5 mg total) by mouth daily. (Patient not taking: No sig reported) 90 tablet 3   vitamin C (ASCORBIC ACID) 500 MG tablet Take 500 mg by mouth daily.     zinc gluconate 50 MG tablet Take 50 mg by mouth every other day.     No current facility-administered medications on file prior to visit.    Family History  Problem Relation Age of Onset   Heart disease Mother 55   Diabetes Mother    Hypertension Sister    Heart disease Maternal Grandmother    Hypertension Brother    Hypertension Sister    Hypertension Sister    Hypertension Brother     Social History   Socioeconomic History   Marital status: Divorced    Spouse name: Not on file   Number of children: 1   Years of education: 12   Highest education level: Not on file  Occupational History   Occupation: Retired     Fish farm manager: [employer]  Tobacco Use   Smoking status: Never   Smokeless tobacco: Never  Vaping Use   Vaping Use: Never used  Substance and Sexual Activity   Alcohol use: No    Alcohol/week: 0.0 standard drinks   Drug use: No   Sexual activity: Not Currently  Other Topics Concern   Not on file  Social History Narrative   Current  Social History 06/14/2020        Patient lives alone in a ground floor apartment which is 1 story. There are not steps up to the entrance the patient uses.       Patient's method of transportation is personal car.      The highest level of education was high school diploma.      The patient currently retired from [employer].      Identified important Relationships are "My grandchildren (3), my daughter."       Pets : None       Interests / Fun: "I walk, talk to my neighbors."       Current Stressors: "Fear of having panic attacks/anxiety."       Religious / Personal Beliefs: "I accept Jesus Christ as my Savior; I am saved."       Eileen Cameron, BSN, RN-BC       Social Determinants of Health   Financial Resource Strain: Not on file  Food Insecurity: Not  on file  Transportation Needs: Not on file  Physical Activity: Not on file  Stress: Not on file  Social Connections: Not on file  Intimate Partner Violence: Not on file    Review of Systems: ROS negative except for what is noted on the assessment and plan.  Vitals:   09/27/21 0950  BP: 132/79  Pulse: 87  Temp: 98.2 F (36.8 C)  TempSrc: Oral  SpO2: 100%  Weight: 177 lb 9.6 oz (80.6 kg)  Height: 5\' 3"  (1.6 m)     Physical Exam: Constitutional: alert, well-appearing, in NAD HENT: normocephalic, atraumatic, mucous membranes moist Eyes: conjunctiva non-erythematous, EOMI Cardiovascular: RRR, no m/r/g, non-edematous bilateral LE Pulmonary/Chest: normal work of breathing on RA, LCTAB Abdominal: soft, non-tender to palpation, non-distended Neurological: A&O x 3 Skin: warm and dry  Psych: anxious behavior, normal affect    Assessment & Plan:   See Encounters Tab for problem based charting.  Patient seen with Dr. Beckie Busing, MD  Internal Medicine Resident, PGY-1 [facility]

## 2021-09-27 NOTE — Assessment & Plan Note (Addendum)
Today's Vitals   09/27/21 0950 09/27/21 0951  BP: 132/79   Pulse: 87   Temp: 98.2 F (36.8 C)   TempSrc: Oral   SpO2: 100%   Weight: 177 lb 9.6 oz (80.6 kg)   Height: 5\' 3"  (1.6 m)   PainSc:  0-No pain   Bp closer to goal on current regimen this visit. Last visit SBP 160's. Suspect HTN contributing to uncontrolled anxiety and feelings of chest discomfort. Pt reports excellent med compliance, no adverse effects. Denies SHOB, headaches, and CP. Benign exam and vitals otherwise stable. She reports making lifestyle modifications.  Continue current therapy with no med changes this visit Continue lifestyle modifications Monitor BP at home and bring log at next visit

## 2021-09-27 NOTE — Assessment & Plan Note (Addendum)
Acute office visit for concerns for uncontrolled anxiety. SBP 130's today, closer to goal. Most recently during office visit on 11/16 she was started on Lexapro 10 mg daily. She took it for 1 day and noted worsening anxiety, reporting she "couldn't think straight" and felt anxious and on edge. Has been started on several SSRIs and buspirone this year; longest duration meds taken is 4 days. Educated pt that it takes weeks to notice any effect of SSRI's/ buspirone. She expressed understanding. Emphasized the need to take daily for weeks before noticing a difference. Pt started seeing a new therapist yesterday and reports that she notices benefit from that. Pt given option to start sertraline and buspirone combination or mirtazipine and sertraline combination. Given pt expressed poor appetite and sleep, shared decision making to start mirtazapine. She does not wish to start a combination during this visit. Emphasized importance of taking this at bedtime, and to take it daily.  Mirtazapine 7.5 mg nightly, and advised to self titrate up to 15 mg (2 pills nightly) in 2 weeks if she tolerates med well.  Will consider adding sertraline 25 mg during f/u visit in 4 weeks with goal of titrating up dose to 75-100 mg in the course of 1 month.  Continue seeing new therapist

## 2021-09-28 ENCOUNTER — Ambulatory Visit: Payer: Medicare Other | Admitting: Behavioral Health

## 2021-09-28 ENCOUNTER — Telehealth: Payer: Self-pay | Admitting: Behavioral Health

## 2021-09-28 NOTE — Telephone Encounter (Signed)
Lft Holiday msg for Pt & related [facility] will r/s her appt since she is unreachable today.  Dr. Theodis Shove

## 2021-10-03 ENCOUNTER — Telehealth: Payer: Self-pay | Admitting: *Deleted

## 2021-10-03 MED ORDER — GABAPENTIN 100 MG PO CAPS: 100.0000 mg | ORAL_CAPSULE | Freq: Three times a day (TID) | ORAL | 0 refills | Status: DC

## 2021-10-03 MED ORDER — POTASSIUM CHLORIDE CRYS ER 10 MEQ PO TBCR
10.0000 meq | EXTENDED_RELEASE_TABLET | Freq: Every day | ORAL | 0 refills | Status: DC
Start: 2021-10-03 — End: 2021-11-09

## 2021-10-03 NOTE — Progress Notes (Signed)
Internal Medicine Clinic Attending  I saw and evaluated the patient.  I personally confirmed the key portions of the history and exam documented by Dr. Posey Pronto and I reviewed pertinent patient test results.  The assessment, diagnosis, and plan were formulated together and I agree with the documentation in the resident's note. Long discussion about options for pharmacotherapy for anxiety, poor appetite, and poor sleep.  Mirtazapine has minimal anxiolytic activity but we are hopeful that improved sleep and appetite may help her daytime symptoms indirectly.  Low risk of serotonin syndrome if SSRI added later.

## 2021-10-03 NOTE — Telephone Encounter (Signed)
Called patient as needed she reports mirtazapine is not working for her anxiety.  She notes that she is staying up at night and drowsy during the day so does not appear to be helping her sleep either.  Does not want to take this anymore.  Advised her that she may discontinue mirtazapine.  She has failed multiple SSRIs and SNRIs at this point.  I will try a low-dose of gabapentin 100 mg 3 times daily and advised her to let me know if this is at all effective and to watch out for excessive sedation if she is not too sedated we may go up on the dose later.  We have an appointment in less than 1 month.  In addition to me she notes that she has had some lower extremity leg cramping she forgot to bring it up during her last visit with the residents.  On review she has had some hypokalemia since starting HCTZ.  We will start 10 mill equivalents of potassium supplementation and recheck at next visit.

## 2021-10-03 NOTE — Telephone Encounter (Signed)
Call from pt - states the anxiety medication, Mirtazapine, "is not working". She states they make her shaky; she's nervous and cannot sleep. And she wants to talk to Dr Heber Rentchler.

## 2021-10-06 ENCOUNTER — Telehealth: Payer: Self-pay

## 2021-10-06 NOTE — Telephone Encounter (Signed)
Return pt's call - stated when she eats something, feels like it gets stuck in her throat,chest and back. Stated this has ben going on "for awhile"; but getting worse. Stated she is taking acid reflux medication but she does not think it's helping. She agreed to schedule an appt - call transferred to front office. Appt schedule with Dr Court Joy tomorrow 12/2 @ 0945 Am.

## 2021-10-06 NOTE — Telephone Encounter (Signed)
Pt is requesting a call back .. pt stated that every time she eats it feels as if the food is being stuck in her throat for a few days now .. when asked how long she stated it has been a while   now she has never spoken to her PCP about it .Marland Kitchen

## 2021-10-07 ENCOUNTER — Encounter: Payer: Self-pay | Admitting: Internal Medicine

## 2021-10-07 ENCOUNTER — Other Ambulatory Visit: Payer: Self-pay

## 2021-10-07 ENCOUNTER — Ambulatory Visit (INDEPENDENT_AMBULATORY_CARE_PROVIDER_SITE_OTHER): Payer: Medicare Other | Admitting: Internal Medicine

## 2021-10-07 VITALS — BP 130/72 | HR 84 | Temp 98.1°F | Resp 24 | Ht 63.0 in | Wt 175.4 lb

## 2021-10-07 DIAGNOSIS — F411 Generalized anxiety disorder: Secondary | ICD-10-CM

## 2021-10-07 DIAGNOSIS — T502X5A Adverse effect of carbonic-anhydrase inhibitors, benzothiadiazides and other diuretics, initial encounter: Secondary | ICD-10-CM

## 2021-10-07 DIAGNOSIS — R1013 Epigastric pain: Secondary | ICD-10-CM

## 2021-10-07 DIAGNOSIS — E876 Hypokalemia: Secondary | ICD-10-CM | POA: Diagnosis not present

## 2021-10-07 MED ORDER — SERTRALINE HCL 25 MG PO TABS: 25.0000 mg | ORAL_TABLET | Freq: Every day | ORAL | 2 refills | Status: DC

## 2021-10-07 MED ORDER — HYDROXYZINE HCL 10 MG PO TABS: 10.0000 mg | ORAL_TABLET | Freq: Three times a day (TID) | ORAL | 0 refills | Status: DC | PRN

## 2021-10-07 NOTE — Patient Instructions (Signed)
Thank you for trusting me with your care. To recap, today we discussed the following:   1. Hypokalemia - CMP14 + Anion Gap  2. Generalized anxiety disorder - hydrOXYzine (ATARAX) 10 MG tablet; Take 1 tablet (10 mg total) by mouth 3 (three) times daily as needed for anxiety.  Dispense: 30 tablet; Refill: 0 - sertraline (ZOLOFT) 25 MG tablet; Take 1 tablet (25 mg total) by mouth daily.  Dispense: 30 tablet; Refill: 2  3. Dyspepsia - Ambulatory referral to Gastroenterology

## 2021-10-07 NOTE — Progress Notes (Signed)
   CC: Hypokalemia, Generalized anxiety disorder, dyspepsia   HPI:[name] is a 70 y.o. female who presents for evaluation of hypokalemia, GAD, and dyspesia. Please see individual problem based A/P for details.  Past Medical History:  Diagnosis Date   Adrenal incidentaloma ([place]) 07/08/2015   1.0 by 1.4 cm lateral limb left adrenal mass on CT abdomen on 07/08/15 Followed up in April 2018, stable no further imaging required.   Allergy    seasonal allergies, ace inhibitors--angioedema   Anxiety    Hyperlipidemia    Hypertension    Leg cramps 11/18/2015   Mold exposure 01/29/2020   Neck pain, musculoskeletal 12/27/2017   Post-nasal drip 10/10/2016   Right shoulder pain 04/20/2016   Review of Systems:   Review of Systems  Constitutional:  Negative for malaise/fatigue and weight loss.  Gastrointestinal:  Positive for abdominal pain. Negative for blood in stool, constipation, melena and nausea.  Psychiatric/Behavioral:  The patient is nervous/anxious and has insomnia.     Physical Exam: Vitals:   10/07/21 0953 10/07/21 0956  BP: 131/67 130/72  Pulse: 91 84  Resp: (!) 24   Temp: 98.1 F (36.7 C)   TempSrc: Oral   SpO2: 100%   Weight: 175 lb 6.4 oz (79.6 kg)   Height: 5\' 3"  (1.6 m)    General: Anxious appearing , attentive and alert  Cardiovascular: Normal rate, regular rhythm.  No murmurs, rubs, or gallops Pulmonary : Equal breath sounds, No wheezes, rales, or rhonchi Abdominal: soft, nontender,  bowel sounds present   Assessment & Plan:   See Encounters Tab for problem based charting.  Patient discussed with Dr. Dareen Piano

## 2021-10-08 DIAGNOSIS — T502X5A Adverse effect of carbonic-anhydrase inhibitors, benzothiadiazides and other diuretics, initial encounter: Secondary | ICD-10-CM | POA: Insufficient documentation

## 2021-10-08 DIAGNOSIS — R1013 Epigastric pain: Secondary | ICD-10-CM | POA: Insufficient documentation

## 2021-10-08 LAB — CMP14 + ANION GAP
ALT: 9 IU/L (ref 0–32)
AST: 19 IU/L (ref 0–40)
Albumin/Globulin Ratio: 1.7 (ref 1.2–2.2)
Albumin: 4.6 g/dL (ref 3.7–4.7)
Alkaline Phosphatase: 84 IU/L (ref 44–121)
Anion Gap: 17 mmol/L (ref 10.0–18.0)
BUN/Creatinine Ratio: 8 — ABNORMAL LOW (ref 12–28)
BUN: 9 mg/dL (ref 8–27)
Bilirubin Total: 0.4 mg/dL (ref 0.0–1.2)
CO2: 22 mmol/L (ref 20–29)
Calcium: 10 mg/dL (ref 8.7–10.3)
Chloride: 99 mmol/L (ref 96–106)
Creatinine, Ser: 1.08 mg/dL — ABNORMAL HIGH (ref 0.57–1.00)
Globulin, Total: 2.7 g/dL (ref 1.5–4.5)
Glucose: 123 mg/dL — ABNORMAL HIGH (ref 70–99)
Potassium: 4.3 mmol/L (ref 3.5–5.2)
Sodium: 138 mmol/L (ref 134–144)
Total Protein: 7.3 g/dL (ref 6.0–8.5)
eGFR: 55 mL/min/{1.73_m2} — ABNORMAL LOW (ref >59)

## 2021-10-08 NOTE — Assessment & Plan Note (Signed)
Patient has had hypokalemia since starting combination pill antihypertensive combo pill which includes HCTZ. Started on 10 milliequivalents of potassium.  PCP planned on checking BMP next visit. Patient has been taking supplementation for only one day.  Assessment/Plan: Hypokalemia - BMP today  Addendum: Hypokalemia resolved. Patient will continue supplementation. Has follow up in 2 weeks with PCP.

## 2021-10-08 NOTE — Assessment & Plan Note (Addendum)
Patient had acute office visit on 11/22 for uncontrolled generalized anxiety disorder.  History shows patient has been tried on multiple medications, but usually stopped medications due to fear of side effects.  Most recently started on mirtazapine.  She reported this medication was making her drowsy during the day and keeping her up at night.  It did not help her anxiety so this medication was stopped.  She was prescribed gabapentin 100 mg 3 times daily, but reports not taking this medication because she was afraid of sedation.  We discussed recommendation to start SSRI.  She is willing to try sertraline which was recommended and prescribed at last visit.  She continues to have difficulty sleeping and believes this is due to her anxiety.  She lives alone now and this seems to be a major driver of her anxiety.  She has a counselor she is meeting with now.  We discussed she could take Gabapentin at night for anxiety and medication has shown to help with sleep. Discussed with attending and decided to try hydroxyzine PRN with sertraline. Patient will try hydroxyzine at night and if does not make her drowsy she can try during the day. Will stop Gabapentin. Patient will have follow up in 2 weeks.   GAD 7:  18 today, 11 at last visit  - hydrOXYzine (ATARAX) 10 MG tablet; Take 1 tablet (10 mg total) by mouth 3 (three) times daily as needed for anxiety.  Dispense: 30 tablet; Refill: 0 - sertraline (ZOLOFT) 25 MG tablet; Take 1 tablet (25 mg total) by mouth daily.  Dispense: 30 tablet; Refill: 2

## 2021-10-08 NOTE — Assessment & Plan Note (Addendum)
Patient is living with GERD and uncontrolled GAD. Hx of GERD in chart dating back to 2016. She has been tried on PPI and now on Nexium. She is currently on  experiencing postprandial symptoms , epigastric pain and fullness. She does also have associated back pain. Reassuring CMP recently and 2018 CT abdomen report reviewed and no AAA. She describes this to me as starting one month ago. Denies any melena, weight loss, dysphasia, or hx of PUD. No alcohol use or daily NSAID use. No endoscopy. Normal abdominal exam.   Assessment/Plan: Dyspepsia, Given age and ongoing symptoms will refer for endoscopy. Seen at [facility] previously for screening colonoscopy.  - Referral for endoscopy - Continue Nexium

## 2021-10-10 NOTE — Progress Notes (Signed)
Internal Medicine Clinic Attending  Case discussed with Dr. Steen  At the time of the visit.  We reviewed the resident's history and exam and pertinent patient test results.  I agree with the assessment, diagnosis, and plan of care documented in the resident's note.  

## 2021-10-14 ENCOUNTER — Telehealth: Payer: Self-pay

## 2021-10-14 NOTE — Telephone Encounter (Signed)
Spoke Eileen Cameron, multiple concerns after reading med insert packet for hydroxyzine, I reassured her however suggested she stay off this medication for now, also previously had a bad response to sertraline so doesn't want to take that, told her to stay off both. We discussed before low dose trial of gabapentin. I suggested we go back to that previous plan.  She was agreeable, she will see me on the 22nd.

## 2021-10-14 NOTE — Telephone Encounter (Signed)
Requesting to speak with a nurse about anxiety medication. Please call pt back.  

## 2021-10-14 NOTE — Telephone Encounter (Signed)
Return pt's call. Stated she was started on Hydroxyzine (at 12/2 OV); the med insert that come with the med stated not to take if you have an irregular heartbeat and she thinks she has this. Stated several hours after taking she started feeing dizzy. Also she was told to take Sertraline which she had tried before and it caused hot flashes and panic attacks and she could not sleep at night. She would like to hear from Dr Heber Potlatch  instead of scheduling a telehealth appt for this afternoon- informed it may not be until Monday since we are closed in the afternoon; she understands.

## 2021-10-25 ENCOUNTER — Encounter: Payer: Medicare Other | Admitting: Internal Medicine

## 2021-10-26 ENCOUNTER — Ambulatory Visit: Payer: Medicare Other | Admitting: Behavioral Health

## 2021-10-27 ENCOUNTER — Ambulatory Visit (INDEPENDENT_AMBULATORY_CARE_PROVIDER_SITE_OTHER): Payer: Medicare Other | Admitting: Internal Medicine

## 2021-10-27 ENCOUNTER — Other Ambulatory Visit: Payer: Self-pay

## 2021-10-27 ENCOUNTER — Encounter: Payer: Self-pay | Admitting: Internal Medicine

## 2021-10-27 VITALS — BP 126/63 | HR 76 | Temp 98.0°F | Ht 63.0 in | Wt 177.1 lb

## 2021-10-27 DIAGNOSIS — F411 Generalized anxiety disorder: Secondary | ICD-10-CM

## 2021-10-27 DIAGNOSIS — R252 Cramp and spasm: Secondary | ICD-10-CM

## 2021-10-27 DIAGNOSIS — I10 Essential (primary) hypertension: Secondary | ICD-10-CM | POA: Diagnosis not present

## 2021-10-27 MED ORDER — BUSPIRONE HCL 5 MG PO TABS: 5.0000 mg | ORAL_TABLET | Freq: Two times a day (BID) | ORAL | 2 refills | Status: DC

## 2021-10-27 NOTE — Progress Notes (Signed)
°  Subjective:  HPI: Eileen Cameron is a 70 y.o. female who presents for f/u anxiety  Please see Assessment and Plan below for the status of her chronic medical problems.  Objective:  Physical Exam: Vitals:   10/27/21 0953 10/27/21 1003  BP: (!) 144/68 126/63  Pulse: 86 76  Temp: 98 F (36.7 C)   TempSrc: Oral   SpO2: 100%   Weight: 177 lb 1.6 oz (80.3 kg)   Height: 5\' 3"  (1.6 m)    Body mass index is 31.37 kg/m. Physical Exam Vitals and nursing note reviewed.  Constitutional:      Appearance: Normal appearance.  Cardiovascular:     Rate and Rhythm: Normal rate and regular rhythm.  Neurological:     General: No focal deficit present.     Mental Status: She is alert and oriented to person, place, and time.  Psychiatric:        Mood and Affect: Mood normal.        Behavior: Behavior normal.   Assessment & Plan:  See Encounters Tab for problem based charting.  Medications Ordered Meds ordered this encounter  Medications   busPIRone (BUSPAR) 5 MG tablet    Sig: Take 1 tablet (5 mg total) by mouth 2 (two) times daily.    Dispense:  60 tablet    Refill:  2   Other Orders Orders Placed This Encounter  Procedures   CMP14 + Anion Gap   Follow Up: Return in about 3 months (around 01/25/2022).

## 2021-10-27 NOTE — Assessment & Plan Note (Signed)
She has been seen our clinic multiple times for her uncontrolled anxiety lately.  Multiple medications have been tried or at least prescribed however frequently she refuses the package insert with the pharmacist and gets too nervous to take the medication.  She does tell me that she thinks the buspirone did actually work and she thinks the previously reported reaction was to sertraline.  She would like to try buspirone again.  She never tried gabapentin as she was told this was a seizure medication and could not help her anxiety at all by her pharmacist.  I discussed with her that she would likely need to take any medication for a period of time to see if it worked that I would not prescribe her something I think is harmful.  She is willing to try buspirone again and so we will try back with 5 mg BuSpar twice daily.  Her anxiety has much worsened over the last 2 years and I suspect much of this is due to the COVID-19 pandemic she now also lives alone.  I have previously encouraged her to get outside and exercise she occasionally does some walking but has not done many activities.  I discussed with her that I would like her to get more involved with senior activities and have printed her the resources for the [employer] in [facility] encouraged her to potentially get involved with group exercise classes and other activities and gain with people her age as I think this will be significantly beneficial for her anxiety as well as overall health.

## 2021-10-28 LAB — CMP14 + ANION GAP
ALT: 7 IU/L (ref 0–32)
AST: 18 IU/L (ref 0–40)
Albumin/Globulin Ratio: 1.9 (ref 1.2–2.2)
Albumin: 4.6 g/dL (ref 3.7–4.7)
Alkaline Phosphatase: 90 IU/L (ref 44–121)
Anion Gap: 17 mmol/L (ref 10.0–18.0)
BUN/Creatinine Ratio: 14 (ref 12–28)
BUN: 15 mg/dL (ref 8–27)
Bilirubin Total: 0.3 mg/dL (ref 0.0–1.2)
CO2: 23 mmol/L (ref 20–29)
Calcium: 10 mg/dL (ref 8.7–10.3)
Chloride: 99 mmol/L (ref 96–106)
Creatinine, Ser: 1.08 mg/dL — ABNORMAL HIGH (ref 0.57–1.00)
Globulin, Total: 2.4 g/dL (ref 1.5–4.5)
Glucose: 114 mg/dL — ABNORMAL HIGH (ref 70–99)
Potassium: 4.4 mmol/L (ref 3.5–5.2)
Sodium: 139 mmol/L (ref 134–144)
Total Protein: 7 g/dL (ref 6.0–8.5)
eGFR: 55 mL/min/{1.73_m2} — ABNORMAL LOW (ref >59)

## 2021-11-09 ENCOUNTER — Other Ambulatory Visit: Payer: Self-pay | Admitting: *Deleted

## 2021-11-11 ENCOUNTER — Other Ambulatory Visit: Payer: Self-pay | Admitting: Internal Medicine

## 2021-11-11 MED ORDER — POTASSIUM CHLORIDE CRYS ER 10 MEQ PO TBCR: 10.0000 meq | EXTENDED_RELEASE_TABLET | Freq: Every day | ORAL | 2 refills | Status: DC

## 2021-11-28 ENCOUNTER — Encounter: Payer: Self-pay | Admitting: Neurology

## 2021-11-28 ENCOUNTER — Ambulatory Visit (INDEPENDENT_AMBULATORY_CARE_PROVIDER_SITE_OTHER): Payer: Medicare Other | Admitting: Neurology

## 2021-11-28 ENCOUNTER — Telehealth: Payer: Self-pay | Admitting: Neurology

## 2021-11-28 VITALS — BP 176/86 | HR 73 | Ht 63.0 in | Wt 179.2 lb

## 2021-11-28 DIAGNOSIS — R0683 Snoring: Secondary | ICD-10-CM | POA: Diagnosis not present

## 2021-11-28 DIAGNOSIS — E669 Obesity, unspecified: Secondary | ICD-10-CM

## 2021-11-28 DIAGNOSIS — G478 Other sleep disorders: Secondary | ICD-10-CM

## 2021-11-28 DIAGNOSIS — G4700 Insomnia, unspecified: Secondary | ICD-10-CM | POA: Diagnosis not present

## 2021-11-28 DIAGNOSIS — R519 Headache, unspecified: Secondary | ICD-10-CM | POA: Diagnosis not present

## 2021-11-28 DIAGNOSIS — G44209 Tension-type headache, unspecified, not intractable: Secondary | ICD-10-CM | POA: Diagnosis not present

## 2021-11-28 NOTE — Patient Instructions (Signed)
It was nice to see you again today!   Here is what we discussed today and what we came up with as our plan for you:    Based on your symptoms and your exam I believe you may be at risk for obstructive sleep apnea (aka OSA).  Since you did not sleep well during a laboratory sleep study a few years ago, I recommend that we proceed with a home sleep test.   A home sleep test is a screening tool for sleep apnea only, and unfortunately does not help with any other sleep-related diagnoses.  The long-term risks and ramifications of untreated moderate to severe obstructive sleep apnea are: increased Cardiovascular disease, including congestive heart failure, stroke, difficult to control hypertension, treatment resistant obesity, arrhythmias, especially irregular heartbeat commonly known as A. Fib. (atrial fibrillation); even type 2 diabetes has been linked to untreated OSA.   Sleep apnea can cause disruption of sleep and sleep deprivation in most cases, which, in turn, can cause recurrent headaches, problems with memory, mood, concentration, focus, and vigilance. Most people with untreated sleep apnea report excessive daytime sleepiness, which can affect their ability to drive. Please do not drive if you feel sleepy. Patients with sleep apnea can also develop difficulty initiating and maintaining sleep (aka insomnia).   We will do a brain scan, called MRI and call you with the test results. We will have to schedule you for this on a separate date. This test requires authorization from your insurance, and we will take care of the insurance process.  Your headaches are likely related to stress or tension, I am glad to hear that your anxiety is getting better.  Please follow-up with your primary care for blood pressure management and anxiety management.  Your blood pressure may also contribute to headaches. I do not believe you have migraines.  Please schedule a routine eye examination as you are due for your  yearly checkup.  For now, we will call you with your results and plan a follow-up in this clinic accordingly.

## 2021-11-28 NOTE — Progress Notes (Signed)
Subjective:    Patient ID: Eileen Cameron is a 71 y.o. female.  HPI    Star Age, MD, PhD [facility] [address] [place], [place] [address]  Dear Drs. Kennon Holter,   I saw your patient, Eileen Cameron, upon your kind request, in my neurologic Clinic today for initial consultation of her recurrent headaches.  The patient is unaccompanied today.  As you know, [name] is a 71 year old right-handed woman with an underlying medical history of anxiety, allergies, hypertension, hyperlipidemia, neck pain, and obesity, who reports an approximately 1 year history of recurrent headaches.  She has never been prone to headaches before, she does not have a history of migraines.  Headaches are in the back, base of her head and sometimes back of her head and sometimes in the front.  Not typically throbbing, mostly pressure-like.  No nausea or vomiting typically.  She feels that her headaches have flared up when her anxiety flared up, she is endorsing stress but no specific cause.  She lives alone, she has tried medication for anxiety but had side effects including with the BuSpar.  She is trying to work on stress and anxiety reduction on her own by way of praying and meditation.  She feels a little better in that regard.  She has not had any sudden onset of one-sided weakness or numbness or tingling or droopy face or slurring of speech.  Sometimes her legs hurt.  She has not had any vision loss or double vision or blurry vision but does have cataracts and is due for her eye exam.  She has not fallen.  Sometimes she takes Tylenol for the headache.  Headache frequency varies quite a bit.  She tries to hydrate.  She does not drink caffeine on a daily basis.  She does not drink any alcohol, she is a non-smoker.  She lives alone, she has 1 grown daughter and 3 grandchildren.  She is divorced.  She does not sleep well.  She snores, she felt that at the time of her  sleep study a few years ago in our sleep lab, she did not sleep well at all, she does not sleep well outside her home.  She does not wake up rested.  She has middle of the night awakenings around 2 or 3 for mild, she gets out of bed and eats something and tries to go back to bed.  She monitors her blood pressure at home, it tends to be in the 140s to 150s over 80s typically.  I reviewed your office note from 09/21/2021.  Her blood pressure was elevated at the time.  She had recently presented to the emergency room on 09/17/2021 with complaints of anxiety and feeling jittery.  She was noted to be hypertensive.  She was treated symptomatically with Ativan.  She presented to the emergency room on 09/16/2021 with a 1 day history of headaches in the back of her head.  She had a head CT without contrast on 09/16/2021 but I could not review the results, reportedly, CT was obtained and showed no evidence of acute abnormality.  She was treated symptomatically with IV fluids, diphenhydramine and metoclopramide.  She felt improved.  I had evaluated her several years ago for concern for sleep apnea.  She had a baseline sleep study on 02/03/2018 which showed a sleep efficiency of 59.7%, sleep latency delayed at 106 minutes, wake after sleep onset of 104.5 minutes.  Total AHI was 1.3/h,  O2 nadir 87%.   Previously:   09/12/17: 71 year old right-handed woman with an underlying medical history of hypertension, reflux disease, vitamin D deficiency, seasonal allergies, and obesity, who reports snoring and excessive daytime somnolence. I reviewed your office note from 08/09/2017. Her Epworth sleepiness score is 4/24 today. She is divorced, she lives alone, she has 1 child. She is retired. She does not smoke and does not utilize any alcohol and does not currently utilize caffeine on a regular basis. She had a tonsillectomy as a child. She has difficulty going to sleep and staying asleep, she still has hot flashes at night.   She lives alone, she has a 24 yo daughter, 3 GC.  She has woken up from post nasal drip. No GERD at night. She has no night to night nocturia. She has no AM HAs. Her 63 grandson has witnessed pauses in her breathing while she is asleep. She has a longer standing difficulty of sleep maintenance and sleep initiation, has been taking over-the-counter Unisom for years as I understand. She has intermittent restless leg symptoms particularly affecting her left leg. She tends to sleep on her sides. She is not aware of any family history of OSA. Her daughter has also mentioned breathing irregularities while she is asleep to her.  Her Past Medical History Is Significant For: Past Medical History:  Diagnosis Date   Adrenal incidentaloma ([place]) 07/08/2015   1.0 by 1.4 cm lateral limb left adrenal mass on CT abdomen on 07/08/15 Followed up in April 2018, stable no further imaging required.   Allergy    seasonal allergies, ace inhibitors--angioedema   Anxiety    Hyperlipidemia    Hypertension    Leg cramps 11/18/2015   Mold exposure 01/29/2020   Neck pain, musculoskeletal 12/27/2017   Post-nasal drip 10/10/2016   Right shoulder pain 04/20/2016    Her Past Surgical History Is Significant For: Past Surgical History:  Procedure Laterality Date   COLONOSCOPY N/A 01/08/2013   Procedure: COLONOSCOPY;  Surgeon: Arta Silence, MD;  Location: [facility];  Service: Endoscopy;  Laterality: N/A;   ECTOPIC PREGNANCY SURGERY  1970s   IR RADIOLOGY PERIPHERAL GUIDED IV START  07/02/2018   IR US GUIDE VASC ACCESS RIGHT  07/02/2018   right breast abscess drainage     TONSILLECTOMY     TUBAL LIGATION      Her Family History Is Significant For: Family History  Problem Relation Age of Onset   Heart disease Mother 25   Diabetes Mother    Hypertension Sister    Hypertension Sister    Hypertension Sister    Hypertension Brother    Hypertension Brother    Heart disease Maternal Grandmother    Migraines  Granddaughter     Her Social History Is Significant For: Social History   Socioeconomic History   Marital status: Divorced    Spouse name: Not on file   Number of children: 1   Years of education: 12   Highest education level: Not on file  Occupational History   Occupation: Retired     Fish farm manager: [employer]  Tobacco Use   Smoking status: Never   Smokeless tobacco: Never  Vaping Use   Vaping Use: Never used  Substance and Sexual Activity   Alcohol use: No    Alcohol/week: 0.0 standard drinks   Drug use: No   Sexual activity: Not Currently  Other Topics Concern   Not on file  Social History Narrative   Current Social History 06/14/2020  Patient lives alone in a ground floor apartment which is 1 story. There are not steps up to the entrance the patient uses.       Patient's method of transportation is personal car.      The highest level of education was high school diploma.      The patient currently retired from [employer].      Identified important Relationships are "My grandchildren (3), my daughter."       Pets : None       Interests / Fun: "I walk, talk to my neighbors."       Current Stressors: "Fear of having panic attacks/anxiety."       Religious / Personal Beliefs: "I accept Jesus Christ as my Savior; I am saved."       L. [place]atte, BSN, RN-BC       Social Determinants of Health   Financial Resource Strain: Not on file  Food Insecurity: Not on file  Transportation Needs: Not on file  Physical Activity: Not on file  Stress: Not on file  Social Connections: Not on file    Her Allergies Are:  Allergies  Allergen Reactions   Ace Inhibitors Swelling  :   Her Current Medications Are:  Outpatient Encounter Medications as of 11/28/2021  Medication Sig   Cholecalciferol (VITAMIN D3 PO) Take 1 tablet by mouth daily.   diclofenac Sodium (VOLTAREN) 1 % GEL Apply 4 g topically 4 (four) times daily.   esomeprazole (NEXIUM) 20 MG capsule  Take 1 capsule (20 mg total) by mouth daily.   fluticasone (FLONASE) 50 MCG/ACT nasal spray Place 2 sprays into both nostrils daily. (Patient taking differently: Place 2 sprays into both nostrils daily as needed for allergies.)   hydrOXYzine (ATARAX) 10 MG tablet Take 1 tablet (10 mg total) by mouth 3 (three) times daily as needed for anxiety.   meclizine (ANTIVERT) 25 MG tablet Take 1 tablet (25 mg total) by mouth 3 (three) times daily as needed for dizziness.   Olmesartan-amLODIPine-HCTZ 40-5-12.5 MG TABS Take 1 tablet by mouth daily.   Omega-3 Fatty Acids (FISH OIL PO) Take 1 capsule by mouth daily.   pantoprazole (PROTONIX) 40 MG tablet TAKE 1 TABLET(40 MG) BY MOUTH DAILY (Patient taking differently: Take 40 mg by mouth daily as needed (acid/heartburn).)   potassium chloride (KLOR-CON M) 10 MEQ tablet Take 1 tablet (10 mEq total) by mouth daily.   pyridoxine (B-6) 100 MG tablet Take 100 mg by mouth daily.   rosuvastatin (CRESTOR) 5 MG tablet Take 1 tablet (5 mg total) by mouth daily.   UNABLE TO FIND daily. Med Name: Ashwagandha Vitamin 2 daily   vitamin C (ASCORBIC ACID) 500 MG tablet Take 500 mg by mouth daily.   zinc gluconate 50 MG tablet Take 50 mg by mouth every other day.   busPIRone (BUSPAR) 5 MG tablet Take 1 tablet (5 mg total) by mouth 2 (two) times daily. (Patient not taking: Reported on 11/28/2021)   No facility-administered encounter medications on file as of 11/28/2021.  : Review of Systems:  Out of a complete 14 point review of systems, all are reviewed and negative with the exception of these symptoms as listed below:  Review of Systems  Neurological:        Pt is here for headaches . Pt states headaches come and go . Pt states pain will be in frontal and occipital. Pt states she had a headache all day yesterday . Pt states she snores and  has Hypertension. Pt states that she did have a sleep study done about 2 years ago. Pt states she couldn't sleep so they stopped study . Pt  states never did sleep study again   ESS:1 FSS  0   Objective:  Neurological Exam  Physical Exam Physical Examination:   Vitals:   11/28/21 0802  BP: (!) 176/86  Pulse: 73    General Examination: The patient is a very pleasant 71 y.o. female in no acute distress. She appears well-developed and well-nourished and well groomed.   HEENT: Normocephalic, atraumatic, pupils are equal, round and reactive to light and accommodation.  Bilateral cataracts noted, funduscopic exam is difficult.  Extraocular tracking is good without limitation to gaze excursion or nystagmus noted. Normal smooth pursuit is noted. Hearing is grossly intact. Face is symmetric with normal facial animation and normal facial sensation. Speech is clear with no dysarthria noted. There is no hypophonia. There is no lip, neck/head, jaw or voice tremor. Neck is supple with full range of passive and active motion. There are no carotid bruits on auscultation. Oropharynx exam reveals: mild to moderate mouth dryness, adequate dental hygiene, dentures on top.  Tongue protrudes centrally and palate elevates symmetrically. Tonsils are absent.    Chest: Clear to auscultation without wheezing, rhonchi or crackles noted.   Heart: S1+S2+0, regular and normal without murmurs, rubs or gallops noted.    Abdomen: Soft, non-tender and non-distended with normal bowel sounds appreciated on auscultation.   Extremities: There is non-pitting puffiness around the ankles.    Skin: Warm and dry without trophic changes noted.   Musculoskeletal: exam reveals no obvious joint deformities.   Neurologically:  Mental status: The patient is awake, alert and oriented in all 4 spheres. Her immediate and remote memory, attention, language skills and fund of knowledge are appropriate. There is no evidence of aphasia, agnosia, apraxia or anomia. Speech is clear with normal prosody and enunciation. Thought process is linear. Mood is normal and affect is  normal.  Cranial nerves II - XII are as described above under HEENT exam. Motor exam: Normal bulk, strength and tone is noted. There is no drift, tremor or rebound. Romberg is negative. Reflexes are 1+ in the upper extremities and trace in the lower extremities.  Toes are flexor bilaterally. Fine motor skills and coordination: intact with normal finger taps, normal hand movements, normal rapid alternating patting, normal foot taps and normal foot agility.  Cerebellar testing: No dysmetria or intention tremor on finger to nose testing. Heel to shin is unremarkable bilaterally. There is no truncal or gait ataxia.  Sensory exam: intact to light touch, temperature and vibration sense in the upper and lower extremities.  Gait, station and balance: She stands easily. No veering to one side is noted. No leaning to one side is noted. Posture is age-appropriate and stance is narrow based. Gait shows normal stride length and normal pace. No problems turning are noted. Tandem walk is challenging for doable slowly.    Assessment and Plan:  In summary, Melodi Happel is a very pleasant 71 year old right-handed woman with an underlying medical history of anxiety, allergies, hypertension, hyperlipidemia, neck pain, and obesity, who presents for evaluation of her recurrent headaches of approximately 1 years duration.  Headache description is mostly in keeping with tension headaches.  She does endorse stress and a correlation with anxiety.  She is working on Sports administrator, has tried some medications for this but did not want to take medication consistently and had some side effects  on the BuSpar.  Blood pressure has been elevated in the recent past, she is advised that elevated blood pressure can also cause headaches.  She does not sleep well.  We mutually agreed to investigate things a little further with a home sleep test.  She may be at risk for sleep apnea.  She had a laboratory attended sleep study in 2019 but did  not sleep well.  She did not have any sleep apnea evident at the time.  She is advised to proceed with a brain MRI with and without contrast to rule out a structural cause of her headaches.  We mutually agreed to hold off on any day-to-day headache preventative medication but monitor her.  She is working on stress reduction on her side of things and is encouraged to follow-up with your clinic for blood pressure management and anxiety management.  She is furthermore advised to schedule her eye examination.  We will keep her posted as to her home sleep test results and brain MRI results by phone call for now and follow-up in this clinic accordingly.  I answered all her questions today and she was in agreement with our plan.   Thank you very much for allowing me to participate in the care of this nice patient. If I can be of any further assistance to you please do not hesitate to call me at [phone].   Sincerely,     Star Age, MD, PhD

## 2021-11-28 NOTE — Telephone Encounter (Signed)
[facility] medicare/medicaid order sent to [facility], [facility] they will reach out to the patient to schedule.

## 2021-11-29 ENCOUNTER — Telehealth: Payer: Self-pay | Admitting: Neurology

## 2021-11-29 NOTE — Telephone Encounter (Signed)
LVM for pt to call me back to schedule sleep study  

## 2021-11-30 ENCOUNTER — Telehealth: Payer: Self-pay | Admitting: Internal Medicine

## 2021-11-30 NOTE — Telephone Encounter (Signed)
Call from patient concerning swelling in her legs.  Was addressed at last visit with Dr. Heber [place].  Patient stated that she did get the hose and has not noticed a big difference.  Patient also stated that her right will occassionally hurt at night as before.  Would like to see if something else could be recommended so she will not need to come in .  When asked about her blood pressure said that blood pressure at her Neurologist visit was 170 over something and was told to follow up with her PCP.

## 2021-11-30 NOTE — Telephone Encounter (Signed)
I am not sure if I will have availability to see her very soon, but with the elevated blood pressure and leg swelling I likely would consider increasing the HCTZ part of her HTN regimen, could she be seen in the resident clinic?

## 2021-11-30 NOTE — Telephone Encounter (Signed)
Pt requesting a call back about her BP and swelling in her leg.

## 2021-12-01 ENCOUNTER — Ambulatory Visit (INDEPENDENT_AMBULATORY_CARE_PROVIDER_SITE_OTHER): Payer: Medicare Other | Admitting: Internal Medicine

## 2021-12-01 ENCOUNTER — Encounter: Payer: Self-pay | Admitting: Internal Medicine

## 2021-12-01 ENCOUNTER — Other Ambulatory Visit: Payer: Self-pay

## 2021-12-01 DIAGNOSIS — R600 Localized edema: Secondary | ICD-10-CM | POA: Diagnosis not present

## 2021-12-01 DIAGNOSIS — F322 Major depressive disorder, single episode, severe without psychotic features: Secondary | ICD-10-CM | POA: Diagnosis not present

## 2021-12-01 DIAGNOSIS — I10 Essential (primary) hypertension: Secondary | ICD-10-CM | POA: Diagnosis not present

## 2021-12-01 NOTE — Progress Notes (Signed)
° °  CC: leg swelling and blood pressure medication follow up  HPI:Ms.Nakyra Bourn is a 71 y.o. female who presents for evaluation of leg selling and BP follow. Please see individual problem based A/P for details.  Depression, PHQ-9: Based on the patients  [place] Visit from 12/01/2021 in [facility]  PHQ-9 Total Score 0      score we have 0.  Past Medical History:  Diagnosis Date   Adrenal incidentaloma ([place]) 07/08/2015   1.0 by 1.4 cm lateral limb left adrenal mass on CT abdomen on 07/08/15 Followed up in April 2018, stable no further imaging required.   Allergy    seasonal allergies, ace inhibitors--angioedema   Anxiety    Hyperlipidemia    Hypertension    Leg cramps 11/18/2015   Mold exposure 01/29/2020   Neck pain, musculoskeletal 12/27/2017   Post-nasal drip 10/10/2016   Right shoulder pain 04/20/2016   Review of Systems:   Review of Systems  Constitutional: Negative.   Eyes: Negative.   Cardiovascular: Negative.   Skin: Negative.     Physical Exam: Vitals:   12/01/21 1355  BP: 117/65  Pulse: 80  Resp: (!) 24  Temp: 98.5 F (36.9 C)  TempSrc: Oral  SpO2: 100%  Weight: 177 lb 11.2 oz (80.6 kg)  Height: 5\' 3"  (1.6 m)     General: alert and oriented HEENT: Conjunctiva nl , antiicteric sclerae, moist mucous membranes, no exudate or erythema Cardiovascular: Normal rate, regular rhythm.  No murmurs, rubs, or gallops Pulmonary : Equal breath sounds, No wheezes, rales, or rhonchi Abdominal: soft, nontender,  bowel sounds present Ext: mild non-pitting edema in lower extremities bilaterally. no tenderness to palpation of lower extremities.   Assessment & Plan:   See Encounters Tab for problem based charting.  Patient seen with Dr. Evette Doffing

## 2021-12-01 NOTE — Patient Instructions (Signed)
Dear Mrs. Sabra Heck,  Thank you for trusting [place] with your care today.  Today we evaluated you for your blood pressure and leg swelling. Your blood pressure is looking good today. We do want you to check your blood pressure every other day and write it down on a piece of paper and bring those numbers with you to your next visit. We will not make any medication changes currently.    For your leg swelling, please continue to use the compression stocking. Exercise as well can help improve this.  Please follow up with Dr. Heber [place].

## 2021-12-07 ENCOUNTER — Ambulatory Visit (INDEPENDENT_AMBULATORY_CARE_PROVIDER_SITE_OTHER): Payer: Medicare Other | Admitting: Neurology

## 2021-12-07 DIAGNOSIS — G44209 Tension-type headache, unspecified, not intractable: Secondary | ICD-10-CM

## 2021-12-07 DIAGNOSIS — G478 Other sleep disorders: Secondary | ICD-10-CM

## 2021-12-07 DIAGNOSIS — E669 Obesity, unspecified: Secondary | ICD-10-CM

## 2021-12-07 DIAGNOSIS — R0683 Snoring: Secondary | ICD-10-CM

## 2021-12-07 DIAGNOSIS — G4733 Obstructive sleep apnea (adult) (pediatric): Secondary | ICD-10-CM | POA: Diagnosis not present

## 2021-12-07 DIAGNOSIS — G4700 Insomnia, unspecified: Secondary | ICD-10-CM

## 2021-12-07 DIAGNOSIS — R519 Headache, unspecified: Secondary | ICD-10-CM

## 2021-12-10 ENCOUNTER — Ambulatory Visit
Admission: RE | Admit: 2021-12-10 | Discharge: 2021-12-10 | Disposition: A | Discharge status: Home / Self Care | Payer: Medicare Other | Source: Ambulatory Visit | Attending: Neurology | Admitting: Neurology

## 2021-12-10 ENCOUNTER — Other Ambulatory Visit: Payer: Self-pay

## 2021-12-10 DIAGNOSIS — E669 Obesity, unspecified: Secondary | ICD-10-CM

## 2021-12-10 DIAGNOSIS — G478 Other sleep disorders: Secondary | ICD-10-CM

## 2021-12-10 DIAGNOSIS — R0683 Snoring: Secondary | ICD-10-CM

## 2021-12-10 DIAGNOSIS — R519 Headache, unspecified: Secondary | ICD-10-CM

## 2021-12-10 DIAGNOSIS — G44209 Tension-type headache, unspecified, not intractable: Secondary | ICD-10-CM

## 2021-12-10 DIAGNOSIS — G4700 Insomnia, unspecified: Secondary | ICD-10-CM

## 2021-12-10 MED ORDER — GADOBENATE DIMEGLUMINE 529 MG/ML IV SOLN
15.0000 mL | Freq: Once | INTRAVENOUS | Status: AC | PRN
  Administered 2021-12-10: 15 mL via INTRAVENOUS

## 2021-12-11 ENCOUNTER — Encounter: Payer: Self-pay | Admitting: Internal Medicine

## 2021-12-11 DIAGNOSIS — I872 Venous insufficiency (chronic) (peripheral): Secondary | ICD-10-CM | POA: Insufficient documentation

## 2021-12-11 DIAGNOSIS — R600 Localized edema: Secondary | ICD-10-CM | POA: Insufficient documentation

## 2021-12-11 NOTE — Assessment & Plan Note (Signed)
Patient reports bilateral "puffiness" in legs for several years. Intermittent swelling.  No change in overall appearance. Edema is non-pitting. BP stable, no evidence of volume overload on physical exam. No JVD, lung crackles, or pitting edema. Advised continued use of compression stockings.

## 2021-12-11 NOTE — Assessment & Plan Note (Addendum)
Compliant with meds. Asymptomatic. BP 117/65.  No change in regimen, continue current management.  Advised patient check BP every other day and record in log to bring to next OV.

## 2021-12-11 NOTE — Assessment & Plan Note (Signed)
She reports intermittently taking her medications. States medications do help. Resume current regimen.

## 2021-12-12 NOTE — Progress Notes (Signed)
°  ° °  [facility]  HOME SLEEP TEST (Watch PAT) REPORT  STUDY DATE: 12/07/2021  DOB: July 20, 1951  MRN: [id number]  ORDERING CLINICIAN: Star Age, MD, PhD   REFERRING CLINICIAN: Lucious Groves, DO   CLINICAL INFORMATION/HISTORY: 71 year old right-handed woman with an underlying medical history of anxiety, allergies, hypertension, hyperlipidemia, neck pain, and obesity, who reports an approximately 1 year history of recurrent headaches. She does not sleep well, she reports snoring and non-restorative sleep.   Epworth sleepiness score: 1/24.  BMI: 31.6 kg/m  FINDINGS:   Sleep Summary:   Total Recording Time (hours, min): 10 hours, 2 minutes  Total Sleep Time (hours, min):  8 hours, 29 minutes   Percent REM (%):    22.9%   Respiratory Indices:   Calculated pAHI (per hour):  14.1/hour         REM pAHI:    12.3/hour       NREM pAHI: 14.7/hour  Oxygen Saturation Statistics:    Oxygen Saturation (%) Mean: 94%   Minimum oxygen saturation (%):                 81%   O2 Saturation Range (%): 81-98%    O2 Saturation (minutes) <=88%: 0.6 min  Pulse Rate Statistics:   Pulse Mean (bpm):    60/min    Pulse Range (53-71/min)   IMPRESSION: OSA (obstructive sleep apnea)   RECOMMENDATION:  This home sleep test demonstrates overall mild (near-moderate) obstructive sleep apnea with a total AHI of 14.1/hour and O2 nadir of 81%.  Intermittent mild to moderate snoring was noted, at times in the louder range. Given the patient's medical history and sleep related complaints, treatment with positive airway pressure is recommended. This can be achieved in the form of autoPAP trial/titration at home. A  full night CPAP titration study will help with proper treatment settings and mask fitting if needed. Alternative treatments may include weight loss along with avoidance of the supine sleep position, or an oral appliance in appropriate candidates.   Please note that  untreated obstructive sleep apnea may carry additional perioperative morbidity. Patients with significant obstructive sleep apnea should receive perioperative PAP therapy and the surgeons and particularly the anesthesiologist should be informed of the diagnosis and the severity of the sleep disordered breathing. The patient should be cautioned not to drive, work at heights, or operate dangerous or heavy equipment when tired or sleepy. Review and reiteration of good sleep hygiene measures should be pursued with any patient. Other causes of the patient's symptoms, including circadian rhythm disturbances, an underlying mood disorder, medication effect and/or an underlying medical problem cannot be ruled out based on this test. Clinical correlation is recommended.   The patient and her referring provider will be notified of the test results. The patient will be seen in follow up in sleep clinic at [facility].  I certify that I have reviewed the raw data recording prior to the issuance of this report in accordance with the standards of the American Academy of Sleep Medicine (AASM).  INTERPRETING PHYSICIAN:   Star Age, MD, PhD  Board Certified in Neurology and Sleep Medicine  [facility] [address] [place], [place] [address] [phone]

## 2021-12-12 NOTE — Addendum Note (Signed)
Addended by: Star Age on: 12/12/2021 06:26 PM   Modules accepted: Orders

## 2021-12-12 NOTE — Procedures (Signed)
°  ° °  [facility]  HOME SLEEP TEST (Watch PAT) REPORT  STUDY DATE: 12/07/2021  DOB: 1951/10/28  MRN: [id number]  ORDERING CLINICIAN: Star Age, MD, PhD   REFERRING CLINICIAN: Lucious Groves, DO   CLINICAL INFORMATION/HISTORY: 71 year old right-handed woman with an underlying medical history of anxiety, allergies, hypertension, hyperlipidemia, neck pain, and obesity, who reports an approximately 1 year history of recurrent headaches. She does not sleep well, she reports snoring and non-restorative sleep.   Epworth sleepiness score: 1/24.  BMI: 31.6 kg/m  FINDINGS:   Sleep Summary:   Total Recording Time (hours, min): 10 hours, 2 minutes  Total Sleep Time (hours, min):  8 hours, 29 minutes   Percent REM (%):    22.9%   Respiratory Indices:   Calculated pAHI (per hour):  14.1/hour         REM pAHI:    12.3/hour       NREM pAHI: 14.7/hour  Oxygen Saturation Statistics:    Oxygen Saturation (%) Mean: 94%   Minimum oxygen saturation (%):                 81%   O2 Saturation Range (%): 81-98%    O2 Saturation (minutes) <=88%: 0.6 min  Pulse Rate Statistics:   Pulse Mean (bpm):    60/min    Pulse Range (53-71/min)   IMPRESSION: OSA (obstructive sleep apnea)   RECOMMENDATION:  This home sleep test demonstrates overall mild (near-moderate) obstructive sleep apnea with a total AHI of 14.1/hour and O2 nadir of 81%.  Intermittent mild to moderate snoring was noted, at times in the louder range. Given the patient's medical history and sleep related complaints, treatment with positive airway pressure is recommended. This can be achieved in the form of autoPAP trial/titration at home. A  full night CPAP titration study will help with proper treatment settings and mask fitting if needed. Alternative treatments may include weight loss along with avoidance of the supine sleep position, or an oral appliance in appropriate candidates.   Please note that  untreated obstructive sleep apnea may carry additional perioperative morbidity. Patients with significant obstructive sleep apnea should receive perioperative PAP therapy and the surgeons and particularly the anesthesiologist should be informed of the diagnosis and the severity of the sleep disordered breathing. The patient should be cautioned not to drive, work at heights, or operate dangerous or heavy equipment when tired or sleepy. Review and reiteration of good sleep hygiene measures should be pursued with any patient. Other causes of the patient's symptoms, including circadian rhythm disturbances, an underlying mood disorder, medication effect and/or an underlying medical problem cannot be ruled out based on this test. Clinical correlation is recommended.   The patient and her referring provider will be notified of the test results. The patient will be seen in follow up in sleep clinic at [facility]  I certify that I have reviewed the raw data recording prior to the issuance of this report in accordance with the standards of the American Academy of Sleep Medicine (AASM).  INTERPRETING PHYSICIAN:   Star Age, MD, PhD  Board Certified in Neurology and Sleep Medicine  [facility] [address] [place], [place] [address] [phone]

## 2021-12-12 NOTE — Progress Notes (Signed)
Internal Medicine Clinic Attending ° °Case discussed with Dr. Gawaluck  At the time of the visit.  We reviewed the resident’s history and exam and pertinent patient test results.  I agree with the assessment, diagnosis, and plan of care documented in the resident’s note.  °

## 2021-12-13 ENCOUNTER — Telehealth: Payer: Self-pay | Admitting: *Deleted

## 2021-12-13 NOTE — Telephone Encounter (Signed)
I called pt. I advised pt that Dr. Rexene Alberts  reviewed their sleep study results and found that pt has mild OSA. Dr. Rexene Alberts recommends that pt may benefit from autopap. I reviewed PAP compliance expectations with the pt. Pt is agreeable  but wants to speak to her pcp prior to starting an auto-PAP. I advised pt that an order will be sent to a DME, [facility], after she calls [place] back and they will call the pt within about one week after they file with the pt's insurance. [facility] will show the pt how to use the machine, fit for masks, and troubleshoot the auto-PAP if needed. A follow up appt was made for insurance purposes with Dr. Rexene Alberts in 10 wks from when she starts Autopap. Pt verbalized understanding to arrive 15 minutes early and bring their auto-PAP. A letter with all of this information in it will be mailed to the pt as a reminder. I verified with the pt that the address we have on file is correct. Pt verbalized understanding of results. Pt had no questions at this time but was encouraged to call back if questions arise. I will send the order to [facility] when I hear back from her.

## 2021-12-13 NOTE — Telephone Encounter (Signed)
-----   Message from Star Age, MD sent at 12/12/2021  6:26 PM EST ----- Patient referred by PCP for headache eval., seen by me on 11/28/21, HST on 12/07/21.    Please call and notify the patient that the recent home sleep test showed obstructive sleep apnea. OSA is overall mild, but worth treating to see if she feels better after treatment. To that end I recommend treatment for this in the form of autoPAP, which means, that we don't have to bring her in for a sleep study with CPAP, but will let her try an autoPAP machine at home, through a DME company (of her choice, or as per insurance requirement). The DME representative will educate her on how to use the machine, how to put the mask on, etc. I have placed an order in the chart. Please send referral, talk to patient, send report to referring MD. We will need a FU in sleep clinic for 10 weeks post-PAP set up, please arrange that with me or one of our NPs. Thanks,   Star Age, MD, PhD [facility])

## 2022-01-10 DIAGNOSIS — K219 Gastro-esophageal reflux disease without esophagitis: Secondary | ICD-10-CM | POA: Diagnosis not present

## 2022-02-09 ENCOUNTER — Ambulatory Visit (INDEPENDENT_AMBULATORY_CARE_PROVIDER_SITE_OTHER): Payer: Medicare Other | Admitting: Internal Medicine

## 2022-02-09 ENCOUNTER — Other Ambulatory Visit: Payer: Self-pay

## 2022-02-09 ENCOUNTER — Encounter: Payer: Self-pay | Admitting: Internal Medicine

## 2022-02-09 VITALS — BP 155/69 | HR 76 | Temp 98.1°F | Ht 63.0 in | Wt 182.8 lb

## 2022-02-09 DIAGNOSIS — M62830 Muscle spasm of back: Secondary | ICD-10-CM

## 2022-02-09 DIAGNOSIS — E611 Iron deficiency: Secondary | ICD-10-CM | POA: Diagnosis not present

## 2022-02-09 DIAGNOSIS — G4733 Obstructive sleep apnea (adult) (pediatric): Secondary | ICD-10-CM | POA: Diagnosis not present

## 2022-02-09 DIAGNOSIS — I10 Essential (primary) hypertension: Secondary | ICD-10-CM | POA: Diagnosis not present

## 2022-02-09 DIAGNOSIS — T502X5A Adverse effect of carbonic-anhydrase inhibitors, benzothiadiazides and other diuretics, initial encounter: Secondary | ICD-10-CM

## 2022-02-09 DIAGNOSIS — E876 Hypokalemia: Secondary | ICD-10-CM | POA: Diagnosis not present

## 2022-02-09 DIAGNOSIS — Z1231 Encounter for screening mammogram for malignant neoplasm of breast: Secondary | ICD-10-CM

## 2022-02-09 MED ORDER — NAPROXEN 500 MG PO TABS: 500.0000 mg | ORAL_TABLET | Freq: Two times a day (BID) | ORAL | 1 refills | Status: AC

## 2022-02-09 NOTE — Progress Notes (Addendum)
?  Subjective:  ?HPI: ?[name] is a 71 y.o. female who presents for f/u HTN, anxiety, as well as new acute low back pain ? ?Please see Assessment and Plan below for the status of her chronic medical problems. ? ?Objective:  ?Physical Exam: ?Vitals:  ? 02/09/22 1025  ?BP: (!) 155/69  ?Pulse: 76  ?Temp: 98.1 ?F (36.7 ?C)  ?TempSrc: Oral  ?SpO2: 100%  ?Weight: 182 lb 12.8 oz (82.9 kg)  ?Height: '5\' 3"'$  (1.6 m)  ? ?Body mass index is 32.38 kg/m?Marland Kitchen ?Physical Exam ?Vitals and nursing note reviewed.  ?Constitutional:   ?   Appearance: Normal appearance.  ?Cardiovascular:  ?   Rate and Rhythm: Normal rate and regular rhythm.  ?Musculoskeletal:  ?   Comments: Ropiness and tenderness of lumbar paraspinals, no tenderness of spinous processes  ?Skin: ?   Capillary Refill: Capillary refill takes less than 2 seconds.  ?Neurological:  ?   General: No focal deficit present.  ?   Mental Status: She is alert.  ?Psychiatric:     ?   Mood and Affect: Mood normal.  ? ?Assessment & Plan:  ?See Encounters Tab for problem based charting. ? ?Medications Ordered ?Meds ordered this encounter  ?Medications  ? naproxen (NAPROSYN) 500 MG tablet  ?  Sig: Take 1 tablet (500 mg total) by mouth 2 (two) times daily with a meal for 14 days.  ?  Dispense:  14 tablet  ?  Refill:  1  ? ?Other Orders ?Orders Placed This Encounter  ?Procedures  ? MM Digital Screening  ?  Standing Status:   Future  ?  Standing Expiration Date:   02/10/2023  ?  Order Specific Question:   Reason for Exam (SYMPTOM  OR DIAGNOSIS REQUIRED)  ?  Answer:   breast cancer screening  ?  Order Specific Question:   Preferred imaging location?  ?  Answer:   [facility]  ? CBC with Diff  ? CMP14 + Anion Gap  ? Ferritin  ? ?Follow Up: ?Return in 3 months (on 05/11/2022), or if symptoms worsen or fail to improve. ? ?

## 2022-02-09 NOTE — Assessment & Plan Note (Signed)
Has been taking potassium supplement but not necessarily every day we will recheck BMP ?

## 2022-02-09 NOTE — Patient Instructions (Signed)
I want you to give Dr Guadelupe Sabin office a call to get started on CPAP, I think this will help you fatigue as well as your blood pressure.  Her number is [phone] ?

## 2022-02-09 NOTE — Assessment & Plan Note (Signed)
Has been having some pain in her right lower back for about the past 2 weeks.  Not sure what started it occasionally she will also notes some left upper back pain.  Has been using ice and heat which helps some.  On exam she has muscular tenderness and spasm of her right lumbar paraspinals.  I suspect that it is a acute muscle spasm causing her pain and advised her to continue heat and we will provide a short course of naproxen ideally less than 7 days with 1 refill provided.  She will call if not better. ?

## 2022-02-09 NOTE — Assessment & Plan Note (Signed)
She had a home sleep test in February that revealed an AHI of 14 and diagnosed with moderate OSA.  She wanted to discuss this with me before starting CPAP.  I have strongly encouraged her to use CPAP as I suspect will help her blood pressure fatigue and likely other issues and that I do believe that CPAP is the best treatment for obstructive sleep apnea.  She will give Dr. Casimer Leek office a call and notify that she is willing to start CPAP. ?

## 2022-02-09 NOTE — Assessment & Plan Note (Signed)
Ferritin was mildly low at 45 and she had been having some leg cramps she took oral iron supplementation and now we will plan to recheck would like to see iron above 75 given nocturnal leg cramps (which actually improved) ?

## 2022-02-09 NOTE — Assessment & Plan Note (Signed)
No headaches or blurry vision overall tolerating her medications well however her blood pressure is running high today.  I discussed with her nonpharmacologic interventions including increased exercise as well as decreasing salt intake and provided her print out with this.  In reviewing this with her we also identified that she occasionally will drink 32 ounce beers and I advised to try to keep this to less than 12 to 16 ounces in 1 day. ? ?Additionally she has been diagnosed with OSA she was a little hesitant about starting on CPAP we discussed this in detail today and I am hopeful that blood pressure will improve with starting CPAP therapy we will arrange a close follow-up with her follow-up in 2 to 4 weeks. ?

## 2022-02-16 ENCOUNTER — Telehealth: Payer: Self-pay | Admitting: Neurology

## 2022-02-16 ENCOUNTER — Telehealth: Payer: Self-pay | Admitting: *Deleted

## 2022-02-16 ENCOUNTER — Other Ambulatory Visit: Payer: Self-pay | Admitting: Internal Medicine

## 2022-02-16 DIAGNOSIS — E876 Hypokalemia: Secondary | ICD-10-CM

## 2022-02-16 LAB — CMP14 + ANION GAP
ALT: 9 IU/L (ref 0–32)
AST: 21 IU/L (ref 0–40)
Albumin/Globulin Ratio: 1.7 (ref 1.2–2.2)
Albumin: 4.7 g/dL (ref 3.7–4.7)
Alkaline Phosphatase: 125 IU/L — ABNORMAL HIGH (ref 44–121)
Anion Gap: 20 mmol/L — ABNORMAL HIGH (ref 10.0–18.0)
BUN/Creatinine Ratio: 18 (ref 12–28)
BUN: 18 mg/dL (ref 8–27)
Bilirubin Total: 0.6 mg/dL (ref 0.0–1.2)
CO2: 18 mmol/L — ABNORMAL LOW (ref 20–29)
Calcium: 10 mg/dL (ref 8.7–10.3)
Chloride: 102 mmol/L (ref 96–106)
Creatinine, Ser: 1 mg/dL (ref 0.57–1.00)
Globulin, Total: 2.8 g/dL (ref 1.5–4.5)
Glucose: 86 mg/dL (ref 70–99)
Potassium: 4.8 mmol/L (ref 3.5–5.2)
Sodium: 140 mmol/L (ref 134–144)
Total Protein: 7.5 g/dL (ref 6.0–8.5)
eGFR: 60 mL/min/{1.73_m2} (ref >59)

## 2022-02-16 LAB — CBC WITH DIFFERENTIAL/PLATELET
Basophils Absolute: 0 10*3/uL (ref 0.0–0.2)
Basos: 1 %
EOS (ABSOLUTE): 0.1 10*3/uL (ref 0.0–0.4)
Eos: 1 %
Hematocrit: 41 % (ref 34.0–46.6)
Hemoglobin: 13.8 g/dL (ref 11.1–15.9)
Immature Grans (Abs): 0 10*3/uL (ref 0.0–0.1)
Immature Granulocytes: 0 %
Lymphocytes Absolute: 1.8 10*3/uL (ref 0.7–3.1)
Lymphs: 45 %
MCH: 30.3 pg (ref 26.6–33.0)
MCHC: 33.7 g/dL (ref 31.5–35.7)
MCV: 90 fL (ref 79–97)
Monocytes Absolute: 0.6 10*3/uL (ref 0.1–0.9)
Monocytes: 14 %
Neutrophils Absolute: 1.6 10*3/uL (ref 1.4–7.0)
Neutrophils: 39 %
Platelets: 255 10*3/uL (ref 150–450)
RBC: 4.56 x10E6/uL (ref 3.77–5.28)
RDW: 12.6 % (ref 11.7–15.4)
WBC: 4.1 10*3/uL (ref 3.4–10.8)

## 2022-02-16 LAB — FERRITIN: Ferritin: 89 ng/mL (ref 15–150)

## 2022-02-16 NOTE — Telephone Encounter (Signed)
Pt states she met with her primary care doctor (Dr. Heber [place]) about auto-PAP machine on 02/09/22 and he advised her to start with the auto-PAP as result of her sleep study. Pt states she is ready for order to be sent.  ?

## 2022-02-16 NOTE — Telephone Encounter (Signed)
Labs obtained on 02/09/22  ?Results for CMP incomplete ?Pt instructed to return for repeat labs ?Pt agreed for appt tomorrow ? ?Labs resulted prior to completing note ?Pt no longer needs to return for repeat labs.  ? ?No further action needed ?Call complete  ?

## 2022-02-16 NOTE — Telephone Encounter (Signed)
Noted thanks. Order sent to [facility].  ?

## 2022-02-16 NOTE — Telephone Encounter (Signed)
Pt called back today stating she's ready to proceed with autoPAP. I have sent the order to [facility].  ?

## 2022-02-20 NOTE — Telephone Encounter (Signed)
Received receipt of confirmation of order from [facility]. ?

## 2022-03-23 DIAGNOSIS — G4733 Obstructive sleep apnea (adult) (pediatric): Secondary | ICD-10-CM | POA: Diagnosis not present

## 2022-03-31 ENCOUNTER — Other Ambulatory Visit: Payer: Self-pay

## 2022-04-04 MED ORDER — OLMESARTAN-AMLODIPINE-HCTZ 40-5-12.5 MG PO TABS: 1.0000 | ORAL_TABLET | Freq: Every day | ORAL | 3 refills | Status: DC

## 2022-04-04 NOTE — Telephone Encounter (Signed)
Can schedule her for routine office visit in next 1-2 months

## 2022-05-06 DIAGNOSIS — G4733 Obstructive sleep apnea (adult) (pediatric): Secondary | ICD-10-CM | POA: Diagnosis not present

## 2022-05-11 ENCOUNTER — Ambulatory Visit (INDEPENDENT_AMBULATORY_CARE_PROVIDER_SITE_OTHER): Payer: Medicare Other | Admitting: Internal Medicine

## 2022-05-11 VITALS — BP 145/72 | HR 78 | Temp 97.8°F | Wt 189.2 lb

## 2022-05-11 DIAGNOSIS — G5702 Lesion of sciatic nerve, left lower limb: Secondary | ICD-10-CM

## 2022-05-11 DIAGNOSIS — G4733 Obstructive sleep apnea (adult) (pediatric): Secondary | ICD-10-CM

## 2022-05-11 DIAGNOSIS — I10 Essential (primary) hypertension: Secondary | ICD-10-CM | POA: Diagnosis not present

## 2022-05-11 MED ORDER — TELMISARTAN-AMLODIPINE 80-5 MG PO TABS: 1.0000 | ORAL_TABLET | Freq: Every day | ORAL | 3 refills | Status: DC

## 2022-05-11 MED ORDER — HYDROCHLOROTHIAZIDE 12.5 MG PO TABS: 12.5000 mg | ORAL_TABLET | Freq: Every day | ORAL | 3 refills | Status: DC

## 2022-05-11 NOTE — Assessment & Plan Note (Signed)
Her blood pressure is slightly above goal however I am also somewhat concerned that her stools could be associated with olmesartan given no other changes or concerning symptoms.  We did recently change her Azor to [employer] I am not sure why this would have triggered anything and she has been olmesartan for a decade however I think at this time we will try to change to a telmisartan amlodipine with a HCTZ if her blood pressure is not controlled then we can increase her HCTZ to 25 mg versus changing over to chlorthalidone.

## 2022-05-11 NOTE — Assessment & Plan Note (Signed)
My exam of her hips is actually rather reassuring.  Only issue we can find is some tenderness of her piriformis I discussed with her some piriformis stretches and advised for use of heat and we will see how this progresses I do not think we need any x-rays at this time.  She does have a some known lumbar degenerative disc disease but no evidence of concern with straight leg raise or radicular symptoms currently.

## 2022-05-11 NOTE — Patient Instructions (Signed)
I want you to give Dr Guadelupe Sabin office a call to follow up about anxiety wearing your CPAP nasal mask. Her number is [phone]

## 2022-05-11 NOTE — Assessment & Plan Note (Signed)
Since her last visit she does report she has tried to use her CPAP machine utilizing the nasal pillows mask however she still feels some anxiety and is not sure that she could use this long-term.  She asked me about device that she is seen on the TV.  I advised against trusting some of those and for [employer] discussed with her that Dr. Dia Sitter had wanted to follow-up with her after 10 weeks of use to review the data and discuss any issues and I suggested that she call her office to see what adjustments could be made as I advised that CPAP is the standard of care.

## 2022-05-11 NOTE — Progress Notes (Signed)
Established Patient Office Visit  Subjective   Patient ID: Eileen Cameron, female    DOB: 1951/03/15  Age: 71 y.o. MRN: [id number]  Chief Complaint  Patient presents with   Hip Pain    Bilateral hip pain x 1wk    Diarrhea    Soft stool     She presents for follow-up of blood pressure as well as for 2 acute complaints. She denies any issues with her blood pressure medication reports taking her Tribenzor daily.  Medication fill data is excellent.  She does complain of some intermittent buttock pain and hip pain left greater than right describes it sometimes as a stiffness and an ache worse with movement not present every day unsure about triggering events other than movement.  Has not really taken any medication for it.  She also reports loose stools she not sure how long it is truly been going on for.  Denies any fever or chills denies actual diarrhea just notes that stools are loose and she has to go multiple times a day.       Objective:     BP (!) 145/72 (BP Location: Left Arm, Patient Position: Sitting, Cuff Size: Normal)   Pulse 78   Temp 97.8 F (36.6 C) (Oral)   Wt 189 lb 3.2 oz (85.8 kg)   SpO2 100%   BMI 33.52 kg/m  BP Readings from Last 3 Encounters:  05/11/22 (!) 145/72  02/09/22 (!) 155/69  12/01/21 117/65   Wt Readings from Last 3 Encounters:  05/11/22 189 lb 3.2 oz (85.8 kg)  02/09/22 182 lb 12.8 oz (82.9 kg)  12/01/21 177 lb 11.2 oz (80.6 kg)      Physical Exam Constitutional:      Appearance: Normal appearance. She is obese.  Cardiovascular:     Rate and Rhythm: Normal rate and regular rhythm.     Pulses: Normal pulses.     Heart sounds: Normal heart sounds.  Musculoskeletal:     Comments: Able to straight leg raise both legs to 90 degrees without any pain.  No pain on AP compression of bilateral pelvic bones no tenderness at greater trochanteric.  No pain with internal and external rotation of the hip during flexion.  She does have pinpoint  tenderness over piriformis left greater than right.  Neurological:     Mental Status: She is alert.      No results found for any visits on 05/11/22.  Last metabolic panel Lab Results  Component Value Date   GLUCOSE 86 02/09/2022   NA 140 02/09/2022   K 4.8 02/09/2022   CL 102 02/09/2022   CO2 18 (L) 02/09/2022   BUN 18 02/09/2022   CREATININE 1.00 02/09/2022   EGFR 60 02/09/2022   CALCIUM 10.0 02/09/2022   PHOS 3.4 08/10/2019   PROT 7.5 02/09/2022   ALBUMIN 4.7 02/09/2022   LABGLOB 2.8 02/09/2022   AGRATIO 1.7 02/09/2022   BILITOT 0.6 02/09/2022   ALKPHOS 125 (H) 02/09/2022   AST 21 02/09/2022   ALT 9 02/09/2022   ANIONGAP 11 09/17/2021   Last vitamin D Lab Results  Component Value Date   VD25OH 30.7 02/24/2020      The 10-year ASCVD risk score (Arnett DK, et al., 2019) is: 16.3%    Assessment & Plan:   Problem List Items Addressed This Visit       Cardiovascular and Mediastinum   Essential hypertension (Chronic)    Her blood pressure is slightly above goal however I am  also somewhat concerned that her stools could be associated with olmesartan given no other changes or concerning symptoms.  We did recently change her Azor to [employer] I am not sure why this would have triggered anything and she has been olmesartan for a decade however I think at this time we will try to change to a telmisartan amlodipine with a HCTZ if her blood pressure is not controlled then we can increase her HCTZ to 25 mg versus changing over to chlorthalidone.      Relevant Medications   Telmisartan-amLODIPine 80-5 MG TABS   hydrochlorothiazide (HYDRODIURIL) 12.5 MG tablet     Respiratory   Moderate obstructive sleep apnea    Since her last visit she does report she has tried to use her CPAP machine utilizing the nasal pillows mask however she still feels some anxiety and is not sure that she could use this long-term.  She asked me about device that she is seen on the TV.  I advised  against trusting some of those and for [employer] discussed with her that Dr. Dia Sitter had wanted to follow-up with her after 10 weeks of use to review the data and discuss any issues and I suggested that she call her office to see what adjustments could be made as I advised that CPAP is the standard of care.        Nervous and Auditory   Piriformis syndrome, left - Primary    My exam of her hips is actually rather reassuring.  Only issue we can find is some tenderness of her piriformis I discussed with her some piriformis stretches and advised for use of heat and we will see how this progresses I do not think we need any x-rays at this time.  She does have a some known lumbar degenerative disc disease but no evidence of concern with straight leg raise or radicular symptoms currently.       Return in about 3 months (around 08/11/2022).    Lucious Groves, DO

## 2022-05-12 ENCOUNTER — Telehealth: Payer: Self-pay | Admitting: *Deleted

## 2022-05-12 NOTE — Telephone Encounter (Signed)
Patient requesting Dr. Heber [place]ney give her some pain cream to rub on her hip.  They discussed the hip pain at visit on 05/11/22.

## 2022-05-16 NOTE — Telephone Encounter (Signed)
Attempted call, no answer left message to call back

## 2022-05-16 NOTE — Telephone Encounter (Signed)
Called back, discussed I don't think creams will help, want her to continue heat and stretching. If not improving call me back in a week and I can send in a muscle relaxer.

## 2022-05-17 ENCOUNTER — Telehealth: Payer: Self-pay

## 2022-05-17 DIAGNOSIS — M25552 Pain in left hip: Secondary | ICD-10-CM

## 2022-05-17 NOTE — Telephone Encounter (Signed)
Questions about Telmisartan-amLODIPine 80-5 MG TABS, please call pt back.

## 2022-05-18 NOTE — Telephone Encounter (Signed)
Call from patient states that her  new B/P med Telmisartian-Amlodipine tablets are to big to swallow wants to know if they can be changed to something else that she can swallow.

## 2022-05-19 NOTE — Telephone Encounter (Signed)
Called [facility], discussed that she may try breaking the pill in half, we do not need to necessarily stay on this medication but I wanted to try it to see if being off her olmesartan improved her loose stools.   ALso she reports her left hip pain is no better, I had thought this was piriformis syndrome.  She is requesting an xray, which is reasonable.  She may need referral to PT

## 2022-06-02 ENCOUNTER — Telehealth: Payer: Self-pay

## 2022-06-02 MED ORDER — HYDROCORTISONE 1 % EX CREA: TOPICAL_CREAM | CUTANEOUS | 3 refills | Status: AC

## 2022-06-02 NOTE — Telephone Encounter (Signed)
Patient called she stated she is still having diarrhea and thinks it is a side effect from one of her medications, patient stated "it couldn't have been the bp pill he told me to stop taking because I am still having diarrhea. Patient is requesting a call back.

## 2022-06-02 NOTE — Telephone Encounter (Signed)
Called [facility], discussed that it frequently can take several weeks for the diarreha/soft stools to resolve after stopping the omeprazole.  I want her to continue with the telmisartan for a little while longer.  She also requested refill of cortisone 10.

## 2022-06-14 NOTE — Telephone Encounter (Signed)
Pt called stating that she was informed to call back mid August regarding her cpap machine. She does not know why she was to call. Possible initial visit ?

## 2022-06-14 NOTE — Telephone Encounter (Signed)
Ok to schedule VV with NP for PAP compliance visit.

## 2022-06-14 NOTE — Telephone Encounter (Signed)
I called pt and she called [facility] about mask she received.  Was the same, and she is now stating they are saying she has to pay for the mask, and she is not happy about it.  I relayed that her compliance date is 06-23-2022, she is not compliant. She has hard time with using mask (hurts nose and she wears dentures).  I told her to use 4 hours or more.  Ok per Dr. Rexene Alberts for VV with NP.  Made appt 06-22-2022 1545 with JM/NP.

## 2022-06-14 NOTE — Telephone Encounter (Signed)
Spoke to pt.  She is having hard time using mask,  received another mask yesterday (but she stated is same mask).  I told her to call [facility] to let them know.  Her compliance time is up on 06-23-2022.  I was going to make appt, she has transporation issues, would mychart VV be ok.

## 2022-06-21 NOTE — Progress Notes (Unsigned)
[facility] [address] [place]. [place] [address] [phone]       OFFICE FOLLOW UP NOTE  Ms. Jayme Mednick Date of Birth:  29-Jan-1951 Medical Record Number:  [id number]   Reason for visit: Initial CPAP follow-up  Virtual Visit via Video Note  I connected with Alessandra Grout on 06/22/22 at  3:45 PM EDT by a video enabled telemedicine application and verified that I am speaking with the correct person using two identifiers.  Location: Patient: Home Provider: In office   I discussed the limitations of evaluation and management by telemedicine and the availability of in person appointments. The patient expressed understanding and agreed to proceed.    SUBJECTIVE:   CHIEF COMPLAINT:  Difficulty tolerating CPAP    HPI:   Update 06/22/2022 JM: Patient returns for follow-up visit after prior consult visit with Dr. Rexene Alberts 7 months ago for recurrent headaches which was felt to be consistent with tension type headaches.  Proceeded with MRI brain which did not show any underlying contributing factors.  Completed HST back in February which showed mild OSA with total AHI of 14.1/h with O2 nadir of 81%.  Recommend initiating AutoPap which was started on 5/18.  Review of compliance report as below showing minimal usage over the past 30 days.  She has had difficulty tolerating her nasal pillow mask as it irritates her nose and hurt her gums (where the mask is laying). Reports she reached out to [facility] 2 weeks ago but is still awaiting for a return call. Reports when she tried to use, she was unable to fall asleep except for 1 time but woke up just a couple hours after.               Consult visit 11/28/2021 Dr. Rexene Alberts: Ms. Kowalewski is a 71 year old right-handed woman with an underlying medical history of anxiety, allergies, hypertension, hyperlipidemia, neck pain, and obesity, who reports an approximately 1 year history of recurrent headaches.  She has never  been prone to headaches before, she does not have a history of migraines.  Headaches are in the back, base of her head and sometimes back of her head and sometimes in the front.  Not typically throbbing, mostly pressure-like.  No nausea or vomiting typically.  She feels that her headaches have flared up when her anxiety flared up, she is endorsing stress but no specific cause.  She lives alone, she has tried medication for anxiety but had side effects including with the BuSpar.  She is trying to work on stress and anxiety reduction on her own by way of praying and meditation.  She feels a little better in that regard.  She has not had any sudden onset of one-sided weakness or numbness or tingling or droopy face or slurring of speech.  Sometimes her legs hurt.  She has not had any vision loss or double vision or blurry vision but does have cataracts and is due for her eye exam.  She has not fallen.  Sometimes she takes Tylenol for the headache.  Headache frequency varies quite a bit.  She tries to hydrate.  She does not drink caffeine on a daily basis.  She does not drink any alcohol, she is a non-smoker.  She lives alone, she has 1 grown daughter and 3 grandchildren.  She is divorced.   She does not sleep well.  She snores, she felt that at the time of her sleep study a few years ago in our sleep lab, she  did not sleep well at all, she does not sleep well outside her home.  She does not wake up rested.  She has middle of the night awakenings around 2 or 3 for mild, she gets out of bed and eats something and tries to go back to bed.   She monitors her blood pressure at home, it tends to be in the 140s to 150s over 80s typically.   I reviewed your office note from 09/21/2021.  Her blood pressure was elevated at the time.  She had recently presented to the emergency room on 09/17/2021 with complaints of anxiety and feeling jittery.  She was noted to be hypertensive.  She was treated symptomatically with Ativan.   She presented to the emergency room on 09/16/2021 with a 1 day history of headaches in the back of her head.  She had a head CT without contrast on 09/16/2021 but I could not review the results, reportedly, CT was obtained and showed no evidence of acute abnormality.  She was treated symptomatically with IV fluids, diphenhydramine and metoclopramide.  She felt improved.  I had evaluated her several years ago for concern for sleep apnea.  She had a baseline sleep study on 02/03/2018 which showed a sleep efficiency of 59.7%, sleep latency delayed at 106 minutes, wake after sleep onset of 104.5 minutes.  Total AHI was 1.3/h, O2 nadir 87%.         ROS:   14 system review of systems performed and negative with exception of those listed in HPI  PMH:  Past Medical History:  Diagnosis Date   Adrenal incidentaloma ([place]) 07/08/2015   1.0 by 1.4 cm lateral limb left adrenal mass on CT abdomen on 07/08/15 Followed up in April 2018, stable no further imaging required.   Allergy    seasonal allergies, ace inhibitors--angioedema   Anxiety    Hyperlipidemia    Hypertension    Leg cramps 11/18/2015   Mold exposure 01/29/2020   Neck pain, musculoskeletal 12/27/2017   Post-nasal drip 10/10/2016   Right shoulder pain 04/20/2016    PSH:  Past Surgical History:  Procedure Laterality Date   COLONOSCOPY N/A 01/08/2013   Procedure: COLONOSCOPY;  Surgeon: Arta Silence, MD;  Location: [facility];  Service: Endoscopy;  Laterality: N/A;   ECTOPIC PREGNANCY SURGERY  1970s   IR RADIOLOGY PERIPHERAL GUIDED IV START  07/02/2018   IR US GUIDE VASC ACCESS RIGHT  07/02/2018   right breast abscess drainage     TONSILLECTOMY     TUBAL LIGATION      Social History:  Social History   Socioeconomic History   Marital status: Divorced    Spouse name: Not on file   Number of children: 1   Years of education: 12   Highest education level: Not on file  Occupational History   Occupation: Retired     Fish farm manager:  [employer]  Tobacco Use   Smoking status: Never   Smokeless tobacco: Never  Vaping Use   Vaping Use: Never used  Substance and Sexual Activity   Alcohol use: No    Alcohol/week: 0.0 standard drinks of alcohol   Drug use: No   Sexual activity: Not Currently  Other Topics Concern   Not on file  Social History Narrative   Current Social History 06/14/2020        Patient lives alone in a ground floor apartment which is 1 story. There are not steps up to the entrance the patient uses.  Patient's method of transportation is personal car.      The highest level of education was high school diploma.      The patient currently retired from [employer].      Identified important Relationships are "My grandchildren (3), my daughter."       Pets : None       Interests / Fun: "I walk, talk to my neighbors."       Current Stressors: "Fear of having panic attacks/anxiety."       Religious / Personal Beliefs: "I accept Jesus Christ as my Savior; I am saved."       L. Ducatte, BSN, RN-BC       Social Determinants of Health   Financial Resource Strain: Not on file  Food Insecurity: Not on file  Transportation Needs: Not on file  Physical Activity: Not on file  Stress: Not on file  Social Connections: Not on file  Intimate Partner Violence: Not on file    Family History:  Family History  Problem Relation Age of Onset   Heart disease Mother 29   Diabetes Mother    Hypertension Sister    Hypertension Sister    Hypertension Sister    Hypertension Brother    Hypertension Brother    Heart disease Maternal Grandmother    Migraines Granddaughter     Medications:   Current Outpatient Medications on File Prior to Visit  Medication Sig Dispense Refill   busPIRone (BUSPAR) 5 MG tablet Take 1 tablet (5 mg total) by mouth 2 (two) times daily. (Patient not taking: Reported on 11/28/2021) 60 tablet 2   Cholecalciferol (VITAMIN D3 PO) Take 1 tablet by mouth daily.      diclofenac Sodium (VOLTAREN) 1 % GEL Apply 4 g topically 4 (four) times daily. 100 g 3   esomeprazole (NEXIUM) 20 MG capsule Take 1 capsule (20 mg total) by mouth daily. 30 capsule 2   fluticasone (FLONASE) 50 MCG/ACT nasal spray Place 2 sprays into both nostrils daily. (Patient taking differently: Place 2 sprays into both nostrils daily as needed for allergies.) 16 g 11   hydrochlorothiazide (HYDRODIURIL) 12.5 MG tablet Take 1 tablet (12.5 mg total) by mouth daily. 90 tablet 3   hydrocortisone cream 1 % Apply to affected area 2 times daily 30 g 3   hydrOXYzine (ATARAX) 10 MG tablet Take 1 tablet (10 mg total) by mouth 3 (three) times daily as needed for anxiety. 30 tablet 0   meclizine (ANTIVERT) 25 MG tablet Take 1 tablet (25 mg total) by mouth 3 (three) times daily as needed for dizziness. 30 tablet 0   Omega-3 Fatty Acids (FISH OIL PO) Take 1 capsule by mouth daily.     pantoprazole (PROTONIX) 40 MG tablet TAKE 1 TABLET(40 MG) BY MOUTH DAILY (Patient taking differently: Take 40 mg by mouth daily as needed (acid/heartburn).) 90 tablet 1   pyridoxine (B-6) 100 MG tablet Take 100 mg by mouth daily.     Telmisartan-amLODIPine 80-5 MG TABS Take 1 tablet by mouth daily. 90 tablet 3   UNABLE TO FIND daily. Med Name: Ashwagandha Vitamin 2 daily     vitamin C (ASCORBIC ACID) 500 MG tablet Take 500 mg by mouth daily.     zinc gluconate 50 MG tablet Take 50 mg by mouth every other day.     No current facility-administered medications on file prior to visit.    Allergies:   Allergies  Allergen Reactions   Ace Inhibitors Swelling  OBJECTIVE:  Physical Exam  General: well developed, well nourished, very pleasant elderly African-American female, seated, in no evident distress  Neurologic Exam Mental Status: Awake and fully alert. Oriented to place and time. Recent and remote memory intact. Attention span, concentration and fund of knowledge appropriate. Mood and affect appropriate.        ASSESSMENT/PLAN: Waynetta Metheny is a 71 y.o. year old female    Mild OSA: Diagnosed per HST 12/2021 with overall AHI 14.1/h.  Has had difficulty tolerating CPAP mask, will place order for mask refitting.  Discussed importance of nightly CPAP use and ensuring greater than 4 hours for optimal benefit and per insurance requirements but preferably use throughout the entire duration of sleep and with any daytime napping.  Discussed ways to help with desensitization.  Continue to follow with DME company for any needed supplies or CPAP related concerns.     Follow up in 4 months or call earlier if needed   CC:  PCP: Lucious Groves, DO    I spent 23 minutes of face-to-face and non-face-to-face time with patient via MyChart video visit.  This included previsit chart review, lab review, study review, order entry, electronic health record documentation, patient education regarding diagnosis of sleep apnea with review and discussion of compliance report and answered all other questions to patient's satisfaction   Frann Rider, [facility]  [facility] [address] [place], [place] [address]  Phone [phone] Fax [phone] Note: This document was prepared with digital dictation and possible smart phrase technology. Any transcriptional errors that result from this process are unintentional.

## 2022-06-22 ENCOUNTER — Telehealth: Payer: Self-pay

## 2022-06-22 ENCOUNTER — Encounter: Payer: Self-pay | Admitting: Adult Health

## 2022-06-22 ENCOUNTER — Telehealth (INDEPENDENT_AMBULATORY_CARE_PROVIDER_SITE_OTHER): Payer: Medicare Other | Admitting: Adult Health

## 2022-06-22 DIAGNOSIS — Z9989 Dependence on other enabling machines and devices: Secondary | ICD-10-CM

## 2022-06-22 DIAGNOSIS — G4733 Obstructive sleep apnea (adult) (pediatric): Secondary | ICD-10-CM | POA: Diagnosis not present

## 2022-06-22 NOTE — Telephone Encounter (Signed)
Community message has been sent to [facility] for CPAP order. 06/22/22

## 2022-06-23 DIAGNOSIS — G4733 Obstructive sleep apnea (adult) (pediatric): Secondary | ICD-10-CM | POA: Diagnosis not present

## 2022-06-27 ENCOUNTER — Telehealth: Payer: Self-pay | Admitting: *Deleted

## 2022-06-27 NOTE — Telephone Encounter (Signed)
Call from patient wanted Dr. Heber [place] to now that there is no change in the bowel movements.  They continue to be the same with the new blood pressure medication.   Continues to have the pain in her right leg and thins it is worse now.

## 2022-07-04 NOTE — Telephone Encounter (Signed)
Called pt - stated she called last week about the new BP meds, Telmisartan-amlodipine and HCTZ, causing her same symptoms with her stomach. Also c/o right leg pain - stated she had mentioned this to her doctor before. At this time, no open appts this week. Thanks

## 2022-07-04 NOTE — Telephone Encounter (Signed)
Pt requesting a call back.  Pt states she has not rec'd call back and is still having problems with her leg and  her bp medication.

## 2022-07-05 NOTE — Telephone Encounter (Signed)
I called her, told her this may be best to address in a visit.  I told her she can come tomorrow at 10:15 (please double book this spot for me as Mr Eileen Cameron always no shows)

## 2022-07-06 ENCOUNTER — Other Ambulatory Visit: Payer: Self-pay

## 2022-07-06 ENCOUNTER — Encounter: Payer: Self-pay | Admitting: Internal Medicine

## 2022-07-06 ENCOUNTER — Ambulatory Visit (INDEPENDENT_AMBULATORY_CARE_PROVIDER_SITE_OTHER): Payer: Medicare Other | Admitting: Internal Medicine

## 2022-07-06 VITALS — BP 152/79 | HR 71 | Temp 98.1°F | Ht 63.0 in | Wt 183.1 lb

## 2022-07-06 DIAGNOSIS — G4733 Obstructive sleep apnea (adult) (pediatric): Secondary | ICD-10-CM

## 2022-07-06 DIAGNOSIS — I10 Essential (primary) hypertension: Secondary | ICD-10-CM | POA: Diagnosis not present

## 2022-07-06 DIAGNOSIS — K529 Noninfective gastroenteritis and colitis, unspecified: Secondary | ICD-10-CM

## 2022-07-06 DIAGNOSIS — R600 Localized edema: Secondary | ICD-10-CM | POA: Diagnosis not present

## 2022-07-06 DIAGNOSIS — F331 Major depressive disorder, recurrent, moderate: Secondary | ICD-10-CM

## 2022-07-06 DIAGNOSIS — K219 Gastro-esophageal reflux disease without esophagitis: Secondary | ICD-10-CM

## 2022-07-06 LAB — BRAIN NATRIURETIC PEPTIDE: B Natriuretic Peptide: 52.8 pg/mL (ref 0.0–100.0)

## 2022-07-06 MED ORDER — OLMESARTAN-AMLODIPINE-HCTZ 40-5-25 MG PO TABS: 1.0000 | ORAL_TABLET | Freq: Every day | ORAL | 3 refills | Status: DC

## 2022-07-06 NOTE — Assessment & Plan Note (Signed)
Has been following up with neurology.  She did get a new mask she is still concerned that it may not fit quite right.  I discussed with her continuing to adjust mask with her DME provider and encouraged adherence to it I do think this will help treat her blood pressure, fatigue and headaches.

## 2022-07-06 NOTE — Assessment & Plan Note (Signed)
Depression symptoms are overall improved she has been walking more has a new grandkid and volunteering at daycare.  Still has a component of anxiety not interested in medications.

## 2022-07-06 NOTE — Progress Notes (Signed)
Established Patient Office Visit  Subjective   Patient ID: Eileen Cameron, female    DOB: [birth year]  Age: 71 y.o. MRN: [id number]  Chief Complaint  Patient presents with   Abdominal Pain    Becoming worse.   Right leg pain   Di [name] follows up today for hypertension, chronic diarrhea/loose stools.  For her blood pressure about 2 months ago I tried changing her from olmesartan to telmisartan due to concerns that the loose stools that she was complaining of may be due to olmesartan causing a sprue like picture.  She notes no changes to her stool consistency with the change and she would really like to go back on to her previous Tribenzor medication.  She is having some lower extremity edema and blood pressure is mildly elevated today.  She denies any orthopnea.  She is still having occasional low back and buttock pain with some pain down to her calfs says lately the right side has been worst at the last visit the left side was worse.  She never got the left hip x-rays that I had ordered.    Objective:     BP (!) 152/79 (BP Location: Left Arm, Cuff Size: Normal)   Pulse 71   Temp 98.1 F (36.7 C) (Oral)   Ht _0  (1.6 m)   Wt 183 lb 1.6 oz (83.1 kg)   SpO2 99% Comment: RA  BMI 32.43 kg/m  BP Readings from Last 3 Encounters:  07/06/22 (!) 152/79  05/11/22 (!) 145/72  02/09/22 (!) 155/69   Wt Readings from Last 3 Encounters:  07/06/22 183 lb 1.6 oz (83.1 kg)  05/11/22 189 lb 3.2 oz (85.8 kg)  02/09/22 182 lb 12.8 oz (82.9 kg)      Physical Exam Constitutional:      Appearance: She is well-developed.  Cardiovascular:     Rate and Rhythm: Normal rate and regular rhythm.  Pulmonary:     Effort: Pulmonary effort is normal.     Breath sounds: Normal breath sounds.  Musculoskeletal:     Comments: No significant lumbar paraspinal tenderness, good ROM of lumbar spine.  She does have mild tenderness ofver piriformis bilaterally.  No trochanteric pain bilaterally.  1+ pedal  edema bilaterally  Neurological:     Mental Status: She is alert.      No results found for any visits on 07/06/22.  Last CBC Lab Results  Component Value Date   WBC 4.1 02/09/2022   HGB 13.8 02/09/2022   HCT 41.0 02/09/2022   MCV 90 02/09/2022   MCH 30.3 02/09/2022   RDW 12.6 02/09/2022   PLT 255 40/81/4481   Last metabolic panel Lab Results  Component Value Date   GLUCOSE 86 02/09/2022   NA 140 02/09/2022   K 4.8 02/09/2022   CL 102 02/09/2022   CO2 18 (L) 02/09/2022   BUN 18 02/09/2022   CREATININE 1.00 02/09/2022   EGFR 60 02/09/2022   CALCIUM 10.0 02/09/2022   PHOS 3.4 08/10/2019   PROT 7.5 02/09/2022   ALBUMIN 4.7 02/09/2022   LABGLOB 2.8 02/09/2022   AGRATIO 1.7 02/09/2022   BILITOT 0.6 02/09/2022   ALKPHOS 125 (H) 02/09/2022   AST 21 02/09/2022   ALT 9 02/09/2022   ANIONGAP 11 09/17/2021      The 10-year ASCVD risk score (Arnett DK, et al., 2019) is: 17.5%    Assessment & Plan:   Problem List Items Addressed This Visit       Cardiovascular and Mediastinum  Essential hypertension (Chronic)    Blood pressure not at goal I am not quite sure if she was taking the 12.5 of HCTZ but she says she was.  Given some lower extremity edema I will increase her HCTZ to 25 mg she wants to go back on the Tribenzor combination which is reasonable given that we tried 2 months off of it      Relevant Medications   Olmesartan-amLODIPine-HCTZ 40-5-25 MG TABS   Other Relevant Orders   BMP8+Anion Gap   Magnesium     Respiratory   Moderate obstructive sleep apnea    Has been following up with neurology.  She did get a new mask she is still concerned that it may not fit quite right.  I discussed with her continuing to adjust mask with her DME provider and encouraged adherence to it I do think this will help treat her blood pressure, fatigue and headaches.        Digestive   GERD (gastroesophageal reflux disease) (Chronic)     Other   MDD (major depressive  disorder), recurrent episode, moderate (HCC)    Depression symptoms are overall improved she has been walking more has a new grandkid and volunteering at daycare.  Still has a component of anxiety not interested in medications.      Lower extremity edema - Primary    Suspect venous insufficiency no orthopnea but will check a BNP.  Advised to continue lower extremity compression we will also increase her HCTZ to 25 mg      Relevant Orders   Brain natriuretic peptide   Other Visit Diagnoses     Chronic diarrhea       Relevant Orders   Fecal fat, qualitative   Fecal Lactoferrin   Giardia/Cryptosporidium EIA   Ova and parasite examination       Return in about 2 months (around 09/05/2022).    Lucious Groves, DO

## 2022-07-06 NOTE — Assessment & Plan Note (Signed)
Blood pressure not at goal I am not quite sure if she was taking the 12.5 of HCTZ but she says she was.  Given some lower extremity edema I will increase her HCTZ to 25 mg she wants to go back on the Tribenzor combination which is reasonable given that we tried 2 months off of it

## 2022-07-06 NOTE — Assessment & Plan Note (Signed)
Suspect venous insufficiency no orthopnea but will check a BNP.  Advised to continue lower extremity compression we will also increase her HCTZ to 25 mg

## 2022-07-07 ENCOUNTER — Other Ambulatory Visit: Payer: Medicare Other

## 2022-07-07 DIAGNOSIS — K529 Noninfective gastroenteritis and colitis, unspecified: Secondary | ICD-10-CM | POA: Diagnosis not present

## 2022-07-07 LAB — BMP8+ANION GAP
Anion Gap: 16 mmol/L (ref 10.0–18.0)
BUN/Creatinine Ratio: 18 (ref 12–28)
BUN: 16 mg/dL (ref 8–27)
CO2: 20 mmol/L (ref 20–29)
Calcium: 9.8 mg/dL (ref 8.7–10.3)
Chloride: 101 mmol/L (ref 96–106)
Creatinine, Ser: 0.91 mg/dL (ref 0.57–1.00)
Glucose: 88 mg/dL (ref 70–99)
Potassium: 4.1 mmol/L (ref 3.5–5.2)
Sodium: 137 mmol/L (ref 134–144)
eGFR: 67 mL/min/{1.73_m2} (ref >59)

## 2022-07-07 LAB — MAGNESIUM: Magnesium: 2.8 mg/dL — ABNORMAL HIGH (ref 1.6–2.3)

## 2022-07-11 LAB — OVA AND PARASITE EXAMINATION

## 2022-07-14 DIAGNOSIS — G4733 Obstructive sleep apnea (adult) (pediatric): Secondary | ICD-10-CM | POA: Diagnosis not present

## 2022-07-15 LAB — GIARDIA/CRYPTOSPORIDIUM EIA
Cryptosporidium EIA: NEGATIVE
Giardia Ag, Stl: NEGATIVE

## 2022-07-17 LAB — FECAL FAT, QUALITATIVE
Fat Qual Neutral, Stl: NORMAL
Fat Qual Total, Stl: NORMAL

## 2022-07-17 LAB — FECAL LACTOFERRIN, QUANT: Lactoferrin, Fecal, Quant.: 8.58 ug/mL(g) — ABNORMAL HIGH (ref 0.00–7.24)

## 2022-07-18 ENCOUNTER — Telehealth: Payer: Self-pay | Admitting: Internal Medicine

## 2022-07-18 DIAGNOSIS — K529 Noninfective gastroenteritis and colitis, unspecified: Secondary | ICD-10-CM

## 2022-07-18 NOTE — Telephone Encounter (Signed)
I reviewed the stool studies with her, given her elevated lactoferrin in the setting of chronic diarrhea I will refer her to GI.  She also is telling me about a lump on the back of her right leg.  In looking back at previous notes I dont think I have ever seen this so I asked that she come in to be evaluated.  I will be attending in clinic tomorrow if there is a resident spot available.

## 2022-07-18 NOTE — Telephone Encounter (Signed)
Attempted to contact patient, but no answer.  Left detailed message about an appointment for tomorrow 07/19/2022 at 3:30 pm to address the lump on her leg.  Forwarding message back to PCP.

## 2022-07-19 ENCOUNTER — Ambulatory Visit (INDEPENDENT_AMBULATORY_CARE_PROVIDER_SITE_OTHER): Payer: Medicare Other | Admitting: Internal Medicine

## 2022-07-19 VITALS — BP 140/66 | HR 74 | Temp 98.2°F | Wt 186.4 lb

## 2022-07-19 DIAGNOSIS — I10 Essential (primary) hypertension: Secondary | ICD-10-CM | POA: Diagnosis not present

## 2022-07-19 DIAGNOSIS — R600 Localized edema: Secondary | ICD-10-CM

## 2022-07-19 DIAGNOSIS — I872 Venous insufficiency (chronic) (peripheral): Secondary | ICD-10-CM | POA: Diagnosis not present

## 2022-07-19 NOTE — Patient Instructions (Signed)
Thank you, Ms.Jolita Renaud for allowing [place] to provide your care today.  Leg pain The pain you are feeling is from veins. It sounds like compressing and using heat is not helping. I am referring you to vascular surgery.   Please follow-up in 4 weeks about your blood pressure  I have ordered the following labs for you:  Lab Orders  No laboratory test(s) ordered today     Referrals ordered today:    Referral Orders         Ambulatory referral to Vascular Surgery      I have ordered the following medication/changed the following medications:   Stop the following medications: There are no discontinued medications.   Start the following medications: No orders of the defined types were placed in this encounter.    Follow up:  4 weeks    We look forward to seeing you next time. Please call our clinic at [phone] if you have any questions or concerns. The best time to call is Monday-Friday from 9am-4pm, but there is someone available 24/7. If after hours or the weekend, call the main hospital number and ask for the Internal Medicine Resident On-Call. If you need medication refills, please notify your pharmacy one week in advance and they will send [place] a request.   Thank you for trusting me with your care. Wishing you the best!   Christiana Fuchs, [place]

## 2022-07-20 NOTE — Assessment & Plan Note (Signed)
Blood pressure remains not at goal. Medication recently changed 8/31. She is adherent with combination pill Tribenzor (Olmesartan-amlodipine-HCTZ 40-5-25 mg). She denies headache, chest pain, or shortness of breath. BP from 148/72 to 140/66. P: I encourage patient to check blood pressure at home. If remains elevated at follow-up would considering increasing HCTZ.

## 2022-07-20 NOTE — Assessment & Plan Note (Signed)
Patient with several month history of leg pain in posterior part of right calf. She has tried compression stockings and heat to relieve pain without relief.  On exam superficial veins are present in area of tenderness, trace edema present in lower extremities bilaterally. A: I talked with patient about venous procedure through vascular surgery to help with pain and she is interested. P: Referral to vascular surgery

## 2022-07-20 NOTE — Progress Notes (Signed)
Subjective:  CC: leg swelling  HPI:  [name] is a 71 y.o. female with a past medical history stated below and presents today for leg swelling. Please see problem based assessment and plan for additional details.  Past Medical History:  Diagnosis Date   Adrenal incidentaloma ([place]) 07/08/2015   1.0 by 1.4 cm lateral limb left adrenal mass on CT abdomen on 07/08/15 Followed up in April 2018, stable no further imaging required.   Allergy    seasonal allergies, ace inhibitors--angioedema   Anxiety    Hyperlipidemia    Hypertension    Leg cramps 11/18/2015   Mold exposure 01/29/2020   Neck pain, musculoskeletal 12/27/2017   Post-nasal drip 10/10/2016   Right shoulder pain 04/20/2016    Current Outpatient Medications on File Prior to Visit  Medication Sig Dispense Refill   Cholecalciferol (VITAMIN D3 PO) Take 1 tablet by mouth daily.     diclofenac Sodium (VOLTAREN) 1 % GEL Apply 4 g topically 4 (four) times daily. 100 g 3   esomeprazole (NEXIUM) 20 MG capsule Take 1 capsule (20 mg total) by mouth daily. 30 capsule 2   fluticasone (FLONASE) 50 MCG/ACT nasal spray Place 2 sprays into both nostrils daily. (Patient taking differently: Place 2 sprays into both nostrils daily as needed for allergies.) 16 g 11   hydrocortisone cream 1 % Apply to affected area 2 times daily 30 g 3   meclizine (ANTIVERT) 25 MG tablet Take 1 tablet (25 mg total) by mouth 3 (three) times daily as needed for dizziness. 30 tablet 0   Olmesartan-amLODIPine-HCTZ 40-5-25 MG TABS Take 1 tablet by mouth daily. 90 tablet 3   Omega-3 Fatty Acids (FISH OIL PO) Take 1 capsule by mouth daily.     UNABLE TO FIND daily. Med Name: Ashwagandha Vitamin 2 daily     vitamin C (ASCORBIC ACID) 500 MG tablet Take 500 mg by mouth daily.     zinc gluconate 50 MG tablet Take 50 mg by mouth every other day.     No current facility-administered medications on file prior to visit.    Family History  Problem Relation Age of  Onset   Heart disease Mother 77   Diabetes Mother    Hypertension Sister    Hypertension Sister    Hypertension Sister    Hypertension Brother    Hypertension Brother    Heart disease Maternal Grandmother    Migraines Granddaughter     Social History   Socioeconomic History   Marital status: Divorced    Spouse name: Not on file   Number of children: 1   Years of education: 12   Highest education level: Not on file  Occupational History   Occupation: Retired     Fish farm manager: [employer]  Tobacco Use   Smoking status: Never   Smokeless tobacco: Never  Vaping Use   Vaping Use: Never used  Substance and Sexual Activity   Alcohol use: No    Alcohol/week: 0.0 standard drinks of alcohol   Drug use: No   Sexual activity: Not Currently  Other Topics Concern   Not on file  Social History Narrative   Current Social History 06/14/2020        Patient lives alone in a ground floor apartment which is 1 story. There are not steps up to the entrance the patient uses.       Patient's method of transportation is personal car.      The highest level of education  was high school diploma.      The patient currently retired from [employer].      Identified important Relationships are "My grandchildren (3), my daughter."       Pets : None       Interests / Fun: "I walk, talk to my neighbors."       Current Stressors: "Fear of having panic attacks/anxiety."       Religious / Personal Beliefs: "I accept Jesus Christ as my Savior; I am saved."       L. Ducatte, BSN, RN-BC       Social Determinants of Health   Financial Resource Strain: Not on file  Food Insecurity: Not on file  Transportation Needs: Not on file  Physical Activity: Not on file  Stress: Not on file  Social Connections: Not on file  Intimate Partner Violence: Not on file    Review of Systems: ROS negative except for what is noted on the assessment and plan.  Objective:   Vitals:   07/19/22 1600  07/19/22 1647  BP: (!) 148/72 (!) 140/66  Pulse: 80 74  Temp: 98.2 F (36.8 C)   TempSrc: Oral   SpO2: 97%   Weight: 186 lb 6.4 oz (84.6 kg)     Physical Exam: Constitutional: well-appearing, in no acute distress Cardiovascular: regular rate and rhythm, no m/r/g Pulmonary/Chest: normal work of breathing on room air, lungs clear to auscultation bilaterally MSK: trace edema in lower extremities bilaterally, tenderness present to posterior aspect of right calf Neurological: alert & oriented x 3, 5/5 strength in bilateral upper and lower extremities, normal gait Skin: warm and dry   Assessment & Plan:  Venous insufficiency Patient with several month history of leg pain in posterior part of right calf. She has tried compression stockings and heat to relieve pain without relief.  On exam superficial veins are present in area of tenderness, trace edema present in lower extremities bilaterally. A: I talked with patient about venous procedure through vascular surgery to help with pain and she is interested. P: Referral to vascular surgery  Essential hypertension Blood pressure remains not at goal. Medication recently changed 8/31. She is adherent with combination pill Tribenzor (Olmesartan-amlodipine-HCTZ 40-5-25 mg). She denies headache, chest pain, or shortness of breath. BP from 148/72 to 140/66. P: I encourage patient to check blood pressure at home. If remains elevated at follow-up would considering increasing HCTZ.    Patient discussed with Dr. Walden Field Kyilee Gregg, D.O. [facility]  PGY-2 Pager: [phone]  Phone: [phone] Date 07/20/2022  Time 5:35 PM

## 2022-07-25 DIAGNOSIS — K219 Gastro-esophageal reflux disease without esophagitis: Secondary | ICD-10-CM | POA: Diagnosis not present

## 2022-07-25 DIAGNOSIS — R197 Diarrhea, unspecified: Secondary | ICD-10-CM | POA: Diagnosis not present

## 2022-07-25 NOTE — Progress Notes (Signed)
Internal Medicine Clinic Attending  Case discussed with Dr. Masters  at the time of the visit.  We reviewed the resident's history and exam and pertinent patient test results.  I agree with the assessment, diagnosis, and plan of care documented in the resident's note.  

## 2022-08-02 ENCOUNTER — Encounter: Payer: Medicare Other | Admitting: Vascular Surgery

## 2022-08-15 DIAGNOSIS — G4733 Obstructive sleep apnea (adult) (pediatric): Secondary | ICD-10-CM | POA: Diagnosis not present

## 2022-08-16 ENCOUNTER — Other Ambulatory Visit: Payer: Self-pay | Admitting: *Deleted

## 2022-08-16 DIAGNOSIS — I872 Venous insufficiency (chronic) (peripheral): Secondary | ICD-10-CM

## 2022-08-22 NOTE — Progress Notes (Unsigned)
[facility]           [facility] [place]  History and Physical   Eileen Cameron is a 71 y.o. female who presents with leg swelling.  She was seen by her PCP last month with hx of right leg pain.  She has tried compression and heat but no relief.  She did have some trace edema and superficial veins and was referred to vascular surgery for further evaluation.    She explains that over past 6-7 months her right leg > left has been hurting. The pain she describes mostly as an aching and also a weakness. Says sometimes feels like her leg just wants to give out and not hold her up. She says the aching pain is on lateral aspect of her right knee and goes down lateral right calf to ankle. Also kind of in right calf. She feels also that this area is swollen. She has tried knee hight OTC compression, which helps a little. She elevates but only in recliner. She feels what has really helped the most is a compression wrap that she uses at night that goes up to her mid thigh and also a muscle cream that she rubs on her leg. She explains that the pain comes and goes. It is not worse on ambulation or rest. She walks 5 days a week and does not endorse any pain while walking.   She has hx of GAD, GERD, back pain, OSA.  She has intolerance to statins for myalgias.  The pt does not have hx of previous venous procedures. The patient has no history of DVT. Pt does no history of varicose vein.   Pt does no history of skin changes in lower legs.   There is no family history of venous disorders.   The patient has used compression stockings in the past.    The pt is not on a statin for cholesterol management.  The pt is not on a daily aspirin.   Other AC:  none The pt is not on medication for hypertension.   The pt is not diabetic.   Tobacco hx:  never  Pt does not have family hx of AAA.  Past Medical History:  Diagnosis Date   Adrenal incidentaloma ([place]) 07/08/2015   1.0 by 1.4 cm  lateral limb left adrenal mass on CT abdomen on 07/08/15 Followed up in April 2018, stable no further imaging required.   Allergy    seasonal allergies, ace inhibitors--angioedema   Anxiety    Hyperlipidemia    Hypertension    Leg cramps 11/18/2015   Mold exposure 01/29/2020   Neck pain, musculoskeletal 12/27/2017   Post-nasal drip 10/10/2016   Right shoulder pain 04/20/2016    Past Surgical History:  Procedure Laterality Date   COLONOSCOPY N/A 01/08/2013   Procedure: COLONOSCOPY;  Surgeon: Arta Silence, MD;  Location: [facility];  Service: Endoscopy;  Laterality: N/A;   ECTOPIC PREGNANCY SURGERY  1970s   IR RADIOLOGY PERIPHERAL GUIDED IV START  07/02/2018   IR US GUIDE VASC ACCESS RIGHT  07/02/2018   right breast abscess drainage     TONSILLECTOMY     TUBAL LIGATION      Social History   Socioeconomic History   Marital status: Divorced    Spouse name: Not on file   Number of children: 1   Years of education: 12   Highest education level: Not on file  Occupational History   Occupation:  Retired     Fish farm manager: [employer]  Tobacco Use   Smoking status: Never    Passive exposure: Never   Smokeless tobacco: Never  Vaping Use   Vaping Use: Never used  Substance and Sexual Activity   Alcohol use: No    Alcohol/week: 0.0 standard drinks of alcohol   Drug use: No   Sexual activity: Not Currently  Other Topics Concern   Not on file  Social History Narrative   Current Social History 06/14/2020        Patient lives alone in a ground floor apartment which is 1 story. There are not steps up to the entrance the patient uses.       Patient's method of transportation is personal car.      The highest level of education was high school diploma.      The patient currently retired from [employer].      Identified important Relationships are "My grandchildren (3), my daughter."       Pets : None       Interests / Fun: "I walk, talk to my neighbors."       Current  Stressors: "Fear of having panic attacks/anxiety."       Religious / Personal Beliefs: "I accept Jesus Christ as my Savior; I am saved."       L. Ducatte, BSN, RN-BC       Social Determinants of Health   Financial Resource Strain: Not on file  Food Insecurity: Not on file  Transportation Needs: Not on file  Physical Activity: Not on file  Stress: Not on file  Social Connections: Not on file  Intimate Partner Violence: Not on file     Family History  Problem Relation Age of Onset   Heart disease Mother 67   Diabetes Mother    Hypertension Sister    Hypertension Sister    Hypertension Sister    Hypertension Brother    Hypertension Brother    Heart disease Maternal Grandmother    Migraines Granddaughter     Current Outpatient Medications  Medication Sig Dispense Refill   Cholecalciferol (VITAMIN D3 PO) Take 1 tablet by mouth daily.     diclofenac Sodium (VOLTAREN) 1 % GEL Apply 4 g topically 4 (four) times daily. 100 g 3   esomeprazole (NEXIUM) 20 MG capsule Take 1 capsule (20 mg total) by mouth daily. 30 capsule 2   hydrocortisone cream 1 % Apply to affected area 2 times daily 30 g 3   meclizine (ANTIVERT) 25 MG tablet Take 1 tablet (25 mg total) by mouth 3 (three) times daily as needed for dizziness. 30 tablet 0   Olmesartan-amLODIPine-HCTZ 40-5-25 MG TABS Take 1 tablet by mouth daily. 90 tablet 3   Omega-3 Fatty Acids (FISH OIL PO) Take 1 capsule by mouth daily.     UNABLE TO FIND daily. Med Name: Ashwagandha Vitamin 2 daily     vitamin C (ASCORBIC ACID) 500 MG tablet Take 500 mg by mouth daily.     zinc gluconate 50 MG tablet Take 50 mg by mouth every other day.     fluticasone (FLONASE) 50 MCG/ACT nasal spray Place 2 sprays into both nostrils daily. (Patient taking differently: Place 2 sprays into both nostrils daily as needed for allergies.) 16 g 11   No current facility-administered medications for this visit.    Allergies  Allergen Reactions   Ace Inhibitors  Swelling    REVIEW OF SYSTEMS:   '[X]'$  denotes positive finding, '[ ]'$   denotes negative finding Cardiac  Comments:  Chest pain or chest pressure:    Shortness of breath upon exertion:    Short of breath when lying flat:    Irregular heart rhythm:        Vascular    Pain in calf, thigh, or hip brought on by ambulation:    Pain in feet at night that wakes you up from your sleep:     Blood clot in your veins:    Leg swelling:  x       Pulmonary    Oxygen at home:    Productive cough:     Wheezing:         Neurologic    Sudden weakness in arms or legs:     Sudden numbness in arms or legs:     Sudden onset of difficulty speaking or slurred speech:    Temporary loss of vision in one eye:     Problems with dizziness:         Gastrointestinal    Blood in stool:     Vomited blood:         Genitourinary    Burning when urinating:     Blood in urine:        Psychiatric    Major depression:         Hematologic    Bleeding problems:    Problems with blood clotting too easily:        Skin    Rashes or ulcers:        Constitutional    Fever or chills:      PHYSICAL EXAMINATION: General:  WDWN in NAD; vital signs documented above Gait: Normal HENT: WNL, normocephalic Pulmonary: normal non-labored breathing without wheezing Cardiac: regular HR; without carotid bruit Abdomen: soft, NT, aortic pulse is not palpable Vascular Exam/Pulses:  Right Left  Radial 2+ (normal) 2+ (normal)  DP 2+ (normal) 2+ (normal)  PT 2+ (normal) 2+ (normal)   Extremities: no varicose veins, scattered spider and reticular veins throughout both right and left lower legs, no tenderness. No hyperpigmentation, no lipodermatosclerosis, very minimal edema R> L  Neurologic: A&O X 3;  moving all extremities equally Psychiatric:  The pt has Normal affect.   Non-Invasive Vascular Imaging:   Venous duplex on 08/24/2022: Venous Reflux Times   +--------------+---------+------+-----------+------------+--------+  RIGHT         Reflux NoRefluxReflux TimeDiameter cmsComments                          Yes                                   +--------------+---------+------+-----------+------------+--------+  CFV                     yes   >1 second                       +--------------+---------+------+-----------+------------+--------+  FV prox       no                                              +--------------+---------+------+-----------+------------+--------+  FV mid        no                                              +--------------+---------+------+-----------+------------+--------+  FV dist       no                                              +--------------+---------+------+-----------+------------+--------+  Popliteal     no                                              +--------------+---------+------+-----------+------------+--------+  GSV at SFJ              yes    >500 ms      0.46              +--------------+---------+------+-----------+------------+--------+  GSV prox thigh          yes    >500 ms      0.58              +--------------+---------+------+-----------+------------+--------+  GSV mid thigh no                            0.34              +--------------+---------+------+-----------+------------+--------+  GSV dist thighno                            0.36              +--------------+---------+------+-----------+------------+--------+  GSV at knee   no                            0.26              +--------------+---------+------+-----------+------------+--------+  GSV prox calf no                            0.17              +--------------+---------+------+-----------+------------+--------+  SSV Pop Fossa no                            0.20               +--------------+---------+------+-----------+------------+--------+  SSV prox calf no                            0.28              +--------------+---------+------+-----------+------------+--------+   Eileen Cameron is a 71 y.o. female who presents with right leg pain. Duplex today shows that patient does not have evidence of DVT.  Pt does have venous reflux  in her CFV, and proximal GSV. She has no SSV reflux. Based on these findings she is not a candidate for any invasive venous intervention and would recommend conservative management - Her symptoms are not typical for venous disease and I suspect that her pain is more musculoskeletal -discussed with pt about wearing knee high 15-20 mmHg compression stockings and pt was measured for these today.   -discussed the importance of leg elevation and how to elevate properly, pt is advised to continue her walking regimen, and avoid sitting or standing for  long periods of time.  -handout with recommendations for venous disease given -pt will f/u as needed if she has new or worsening  symptoms   Paulo Fruit, [facility] [phone]  Clinic MD:  Donzetta Matters

## 2022-08-24 ENCOUNTER — Ambulatory Visit (INDEPENDENT_AMBULATORY_CARE_PROVIDER_SITE_OTHER): Payer: Medicare Other | Admitting: Physician Assistant

## 2022-08-24 ENCOUNTER — Encounter: Payer: Self-pay | Admitting: Physician Assistant

## 2022-08-24 ENCOUNTER — Ambulatory Visit (HOSPITAL_COMMUNITY)
Admission: RE | Admit: 2022-08-24 | Discharge: 2022-08-24 | Disposition: A | Discharge status: Home / Self Care | Payer: Medicare Other | Source: Ambulatory Visit | Attending: Vascular Surgery | Admitting: Vascular Surgery

## 2022-08-24 VITALS — BP 154/74 | HR 70 | Temp 97.9°F | Resp 20 | Ht 63.0 in | Wt 182.7 lb

## 2022-08-24 DIAGNOSIS — I872 Venous insufficiency (chronic) (peripheral): Secondary | ICD-10-CM | POA: Diagnosis not present

## 2022-08-24 DIAGNOSIS — M25561 Pain in right knee: Secondary | ICD-10-CM

## 2022-09-06 DIAGNOSIS — R1013 Epigastric pain: Secondary | ICD-10-CM | POA: Diagnosis not present

## 2022-09-06 DIAGNOSIS — K2930 Chronic superficial gastritis without bleeding: Secondary | ICD-10-CM | POA: Diagnosis not present

## 2022-09-06 DIAGNOSIS — B9681 Helicobacter pylori [H. pylori] as the cause of diseases classified elsewhere: Secondary | ICD-10-CM | POA: Diagnosis not present

## 2022-09-06 DIAGNOSIS — R197 Diarrhea, unspecified: Secondary | ICD-10-CM | POA: Diagnosis not present

## 2022-09-06 DIAGNOSIS — K529 Noninfective gastroenteritis and colitis, unspecified: Secondary | ICD-10-CM | POA: Diagnosis not present

## 2022-09-14 ENCOUNTER — Encounter: Payer: Self-pay | Admitting: Internal Medicine

## 2022-09-14 ENCOUNTER — Ambulatory Visit (INDEPENDENT_AMBULATORY_CARE_PROVIDER_SITE_OTHER): Payer: Medicare Other | Admitting: Internal Medicine

## 2022-09-14 ENCOUNTER — Other Ambulatory Visit: Payer: Self-pay

## 2022-09-14 VITALS — BP 141/74 | HR 72 | Temp 98.2°F | Ht 63.0 in | Wt 178.3 lb

## 2022-09-14 DIAGNOSIS — G3184 Mild cognitive impairment, so stated: Secondary | ICD-10-CM

## 2022-09-14 DIAGNOSIS — G4733 Obstructive sleep apnea (adult) (pediatric): Secondary | ICD-10-CM | POA: Diagnosis not present

## 2022-09-14 DIAGNOSIS — R519 Headache, unspecified: Secondary | ICD-10-CM

## 2022-09-14 DIAGNOSIS — G5702 Lesion of sciatic nerve, left lower limb: Secondary | ICD-10-CM

## 2022-09-14 DIAGNOSIS — Z0000 Encounter for general adult medical examination without abnormal findings: Secondary | ICD-10-CM | POA: Diagnosis not present

## 2022-09-14 MED ORDER — MELATONIN 5 MG PO TABS: 5.0000 mg | ORAL_TABLET | Freq: Every evening | ORAL | 0 refills | Status: DC | PRN

## 2022-09-14 NOTE — Patient Instructions (Signed)
  Eileen Cameron , Thank you for taking time to come for your Medicare Wellness Visit. I appreciate your ongoing commitment to your health goals. Please review the following plan we discussed and let me know if I can assist you in the future.   These are the goals we discussed:  Goals       Blood Pressure < 150/90      Continue walking 1 mile every day (pt-stated)        This is a list of the screening recommended for you and due dates:  Health Maintenance  Topic Date Due   Zoster (Shingles) Vaccine (1 of 2) Never done   COVID-19 Vaccine (4 - Moderna series) 03/18/2021   Medicare Annual Wellness Visit  06/14/2021   Mammogram  10/05/2021   Colon Cancer Screening  01/09/2023   Tetanus Vaccine  07/12/2024   Pneumonia Vaccine  Completed   Flu Shot  Completed   DEXA scan (bone density measurement)  Completed   Hepatitis C Screening: USPSTF Recommendation to screen - Ages 18-79 yo.  Completed   HPV Vaccine  Aged Out

## 2022-09-14 NOTE — Progress Notes (Signed)
Annual Wellness Visit     Patient: Eileen Cameron, Female    DOB: 02-May-1951, 71 y.o.   MRN: [id number]  Subjective  Chief Complaint  Patient presents with   Check-up Visit    H/A's. Anxiety.    Eileen Cameron is a 71 y.o. female who presents today for her Annual Wellness Visit. She reports consuming a general and low protein diet. She is walking 1 mile per day. She generally feels fairly well except for headaches. She reports sleeping poorly. She does have additional problems to discuss today.   She reports that she continues to have some degree of anxiety but overall improved.  She has been having occasional headaches she feels headaches started the back of her head at the base of her skull wraparound up to her eyebrows.  Bilateral and squeezing in nature.  Usually last several hours.  She notes she recently underwent colonoscopy with gastroenterology last week she reports that the result was normal.  She does not remember the name of her gastroenterologist I believe it was Dr. Michail Sermon with the goal but we have not received records back yet.  She notes that she was told to take a probiotic.  She was also seen by vein and vascular for her chronic lower extremity edema she was advised to use lower extremity compression stockings but no further intervention is needed.  She is in agreement with this.  Vision:Within last year and Dental: No current dental problems, Receives regular dental care, and Last dental visit: 3-4 months ago  saw [facility]    Patient Active Problem List   Diagnosis Date Noted   Essential hypertension 04/01/2009    Priority: High   GERD (gastroesophageal reflux disease) 11/28/2014    Priority: Medium    Seasonal allergies 12/31/2013    Priority: Medium    Lumbar back pain with radiculopathy affecting right lower extremity 04/01/2009    Priority: Medium    Preventative health care 08/26/2012    Priority: Low   Mild cognitive impairment 09/15/2022    Piriformis syndrome, left 05/11/2022   Muscle spasm of back 02/09/2022   Moderate obstructive sleep apnea 02/09/2022   Iron deficiency 02/09/2022   Venous insufficiency 12/11/2021   Dyspepsia 10/08/2021   Diuretic-induced hypokalemia 10/08/2021   Headache in back of head 09/21/2021   Generalized anxiety disorder 07/14/2021   Gastroenteritis 01/24/2021   MDD (major depressive disorder), recurrent episode, moderate ([place]) 08/25/2019   Bilateral temporomandibular joint pain 05/01/2019   Palpitations 02/18/2018   Visual floaters, bilateral 02/18/2018   Primary osteoarthritis of right knee 02/14/2018   Rhinitis, chronic 01/22/2018   Vertigo 12/27/2017   Obesity (BMI 30.0-34.9) 08/09/2017   Vitamin D insufficiency 08/07/2017   Myalgia due to statin 02/08/2017   Risk for coronary artery disease between 10% and 20% in next 10 years 08/11/2016   History of statin therapy 08/10/2016   Encounter for immunization 07/18/2016   Insomnia 09/18/2014   Fatigue due to sleep pattern disturbance 05/13/2013   Hot flashes 04/08/2013   Past Medical History:  Diagnosis Date   Adrenal incidentaloma ([place]) 07/08/2015   1.0 by 1.4 cm lateral limb left adrenal mass on CT abdomen on 07/08/15 Followed up in April 2018, stable no further imaging required.   Allergy    seasonal allergies, ace inhibitors--angioedema   Anxiety    Hyperlipidemia    Hypertension    Leg cramps 11/18/2015   Mold exposure 01/29/2020   Neck pain, musculoskeletal 12/27/2017   Post-nasal drip  10/10/2016   Right shoulder pain 04/20/2016      Medications: Outpatient Medications Prior to Visit  Medication Sig   acetaminophen (TYLENOL) 500 MG tablet Take 500 mg by mouth every 6 (six) hours as needed for headache.   Cholecalciferol (VITAMIN D3 PO) Take 1 tablet by mouth daily.   diclofenac Sodium (VOLTAREN) 1 % GEL Apply 4 g topically 4 (four) times daily.   esomeprazole (NEXIUM) 20 MG capsule Take 1 capsule (20 mg total) by mouth  daily.   fluticasone (FLONASE) 50 MCG/ACT nasal spray Place 2 sprays into both nostrils daily. (Patient taking differently: Place 2 sprays into both nostrils daily as needed for allergies.)   hydrocortisone cream 1 % Apply to affected area 2 times daily   meclizine (ANTIVERT) 25 MG tablet Take 1 tablet (25 mg total) by mouth 3 (three) times daily as needed for dizziness.   Olmesartan-amLODIPine-HCTZ 40-5-25 MG TABS Take 1 tablet by mouth daily.   Omega-3 Fatty Acids (FISH OIL PO) Take 1 capsule by mouth daily.   UNABLE TO FIND daily. Med Name: Ashwagandha Vitamin 2 daily   vitamin C (ASCORBIC ACID) 500 MG tablet Take 500 mg by mouth daily.   zinc gluconate 50 MG tablet Take 50 mg by mouth every other day.   No facility-administered medications prior to visit.    Allergies  Allergen Reactions   Ace Inhibitors Swelling    Patient Care Team: Lucious Groves, DO as PCP - General (Internal Medicine) Star Age, MD as Attending Physician (Neurology)      Objective  BP (!) 141/74 (BP Location: Left Arm, Patient Position: Sitting, Cuff Size: Normal)   Pulse 72   Temp 98.2 F (36.8 C) (Oral)   Ht _0  (1.6 m)   Wt 178 lb 4.8 oz (80.9 kg)   SpO2 98% Comment: RA  BMI 31.58 kg/m  BP Readings from Last 3 Encounters:  09/14/22 (!) 141/74  08/24/22 (!) 154/74  07/19/22 (!) 140/66   Wt Readings from Last 3 Encounters:  09/14/22 178 lb 4.8 oz (80.9 kg)  08/24/22 182 lb 11.2 oz (82.9 kg)  07/19/22 186 lb 6.4 oz (84.6 kg)      Physical Exam Constitutional:      Appearance: Normal appearance.  Cardiovascular:     Rate and Rhythm: Normal rate and regular rhythm.  Pulmonary:     Effort: Pulmonary effort is normal.     Breath sounds: Normal breath sounds.  Abdominal:     Palpations: Abdomen is soft.  Musculoskeletal:     Right lower leg: Edema (trace) present.     Left lower leg: Edema (trace) present.  Skin:    General: Skin is warm and dry.  Neurological:     Mental  Status: She is alert and oriented to person, place, and time.       Most recent functional status assessment:    09/14/2022    9:40 AM  In your present state of health, do you have any difficulty performing the following activities:  Hearing? 1  Vision? 0  Difficulty concentrating or making decisions? 0  Walking or climbing stairs? 0  Dressing or bathing? 0  Doing errands, shopping? 0   Most recent fall risk assessment:    09/14/2022    9:40 AM  [place] in the past year? 0  Number falls in past yr: 0  Injury with Fall? 0  Risk for fall due to : No Fall Risks  Follow up  Falls evaluation completed;Falls prevention discussed    Most recent depression screenings:    09/14/2022    9:40 AM 07/19/2022    4:14 PM  PHQ 2/9 Scores  PHQ - 2 Score 0 0  PHQ- 9 Score  3   Most recent cognitive screening:     No data to display            09/14/2022    4:58 PM  Montreal Cognitive Assessment   Visuospatial/ Executive (0/5) 4  Naming (0/3) 2  Attention: Read list of digits (0/2) 2  Attention: Read list of letters (0/1) 1  Attention: Serial 7 subtraction starting at 100 (0/3) 1  Language: Repeat phrase (0/2) 1  Language : Fluency (0/1) 1  Abstraction (0/2) 1  Delayed Recall (0/5) 2  Orientation (0/6) 6  Total 21  Adjusted Score (based on education) 22    Most recent Audit-C alcohol use screening    09/14/2022   10:10 AM  Alcohol Use Disorder Test (AUDIT)  1. How often do you have a drink containing alcohol? 0  2. How many drinks containing alcohol do you have on a typical day when you are drinking? 0  3. How often do you have six or more drinks on one occasion? 0  AUDIT-C Score 0   A score of 3 or more in women, and 4 or more in men indicates increased risk for alcohol abuse, EXCEPT if all of the points are from question 1   Vision/Hearing Screen: No results found.  Last CBC Lab Results  Component Value Date   WBC 4.1 02/09/2022   HGB 13.8  02/09/2022   HCT 41.0 02/09/2022   MCV 90 02/09/2022   MCH 30.3 02/09/2022   RDW 12.6 02/09/2022   PLT 255 18/84/1660   Last metabolic panel Lab Results  Component Value Date   GLUCOSE 88 07/06/2022   NA 137 07/06/2022   K 4.1 07/06/2022   CL 101 07/06/2022   CO2 20 07/06/2022   BUN 16 07/06/2022   CREATININE 0.91 07/06/2022   EGFR 67 07/06/2022   CALCIUM 9.8 07/06/2022   PHOS 3.4 08/10/2019   PROT 7.5 02/09/2022   ALBUMIN 4.7 02/09/2022   LABGLOB 2.8 02/09/2022   AGRATIO 1.7 02/09/2022   BILITOT 0.6 02/09/2022   ALKPHOS 125 (H) 02/09/2022   AST 21 02/09/2022   ALT 9 02/09/2022   ANIONGAP 11 09/17/2021   Last lipids Lab Results  Component Value Date   CHOL 185 12/30/2020   HDL 62 12/30/2020   LDLCALC 108 (H) 12/30/2020   TRIG 82 12/30/2020   CHOLHDL 3.0 12/30/2020   Last hemoglobin A1c Lab Results  Component Value Date   HGBA1C 5.9 02/07/2010      Results for orders placed or performed in visit on 09/14/22  Vitamin B12  Result Value Ref Range   Vitamin B-12 >2000 (H) 232 - 1245 pg/mL      Assessment & Plan   Annual wellness visit done today including the all of the following: Reviewed patient's Family Medical History Reviewed and updated list of patient's medical providers Assessment of cognitive impairment was done Assessed patient's functional ability Established a written schedule for health screening [place] Completed and Reviewed  Exercise Activities and Dietary recommendations  Goals       Blood Pressure < 150/90      Continue walking 1 mile every day (pt-stated)        Immunization History  Administered Date(s) Administered  Fluad Quad(high Dose 65+) 07/28/2021, 07/17/2022   Influenza Split 07/30/2012   Influenza, High Dose Seasonal PF 01/21/2021   Influenza,inj,Quad PF,6+ Mos 08/19/2013, 07/17/2014, 07/05/2015, 07/18/2016, 08/09/2017   Influenza-Unspecified 08/06/2018   Moderna Sars-Covid-2 Vaccination  12/30/2019, 01/27/2020, 01/21/2021   Pneumococcal Conjugate-13 04/20/2015   Pneumococcal Polysaccharide-23 03/31/2014, 01/21/2021   Tdap 07/30/2012, 07/12/2014    Health Maintenance  Topic Date Due   Zoster Vaccines- Shingrix (1 of 2) Never done   COVID-19 Vaccine (4 - Moderna series) 03/18/2021   Medicare Annual Wellness (AWV)  06/14/2021   MAMMOGRAM  10/05/2021   COLONOSCOPY (Pts 45-38yr Insurance coverage will need to be confirmed)  01/09/2023   TETANUS/TDAP  07/12/2024   Pneumonia Vaccine 71 Years old  Completed   INFLUENZA VACCINE  Completed   DEXA SCAN  Completed   Hepatitis C Screening  Completed   HPV VACCINES  Aged Out     Discussed health benefits of physical activity, and encouraged her to engage in regular exercise appropriate for her age and condition.    Problem List Items Addressed This Visit       Respiratory   Moderate obstructive sleep apnea    She notes that her CPAP machine was taken back because she was not using it enough.  She reports she plans to reach out to Dr. ACasimer Leekoffice to ask if she may be able to get it reinstated.        Nervous and Auditory   Piriformis syndrome, left    Much improved.        Other   Mild cognitive impairment - Primary    Moca today was 22 slightly low and would be consistent with a mild cognitive impairment.  I discussed with her we will check vitamin B12 today and otherwise recheck a Moca at the next time to monitor this.  I would like to get her sleep apnea better treated as this may be contributing.      Relevant Orders   Vitamin B12 (Completed)   Headache in back of head    I discussed with her I think this is mostly due to tension headache.  Sleep apnea certainly not helping.  Advised Tylenol and heating pad to the back of the neck area.      Relevant Medications   acetaminophen (TYLENOL) 500 MG tablet    Return 4-5 months.     ELucious Groves DO

## 2022-09-15 DIAGNOSIS — G3184 Mild cognitive impairment, so stated: Secondary | ICD-10-CM | POA: Insufficient documentation

## 2022-09-15 LAB — VITAMIN B12: Vitamin B-12: >2000 pg/mL — ABNORMAL HIGH (ref 232–1245)

## 2022-09-15 NOTE — Assessment & Plan Note (Signed)
I discussed with her I think this is mostly due to tension headache.  Sleep apnea certainly not helping.  Advised Tylenol and heating pad to the back of the neck area.

## 2022-09-15 NOTE — Assessment & Plan Note (Signed)
She notes that her CPAP machine was taken back because she was not using it enough.  She reports she plans to reach out to Dr. Casimer Leek office to ask if she may be able to get it reinstated.

## 2022-09-15 NOTE — Assessment & Plan Note (Signed)
Moca today was 22 slightly low and would be consistent with a mild cognitive impairment.  I discussed with her we will check vitamin B12 today and otherwise recheck a Moca at the next time to monitor this.  I would like to get her sleep apnea better treated as this may be contributing.

## 2022-09-15 NOTE — Assessment & Plan Note (Signed)
Much improved

## 2022-09-26 ENCOUNTER — Emergency Department (HOSPITAL_COMMUNITY)
Admission: EM | Admit: 2022-09-26 | Discharge: 2022-09-26 | Disposition: A | Discharge status: Home / Self Care | Payer: Medicare Other | Attending: Emergency Medicine | Admitting: Emergency Medicine

## 2022-09-26 ENCOUNTER — Encounter (HOSPITAL_COMMUNITY): Payer: Self-pay | Admitting: Emergency Medicine

## 2022-09-26 ENCOUNTER — Telehealth: Payer: Self-pay | Admitting: *Deleted

## 2022-09-26 DIAGNOSIS — Z79899 Other long term (current) drug therapy: Secondary | ICD-10-CM | POA: Insufficient documentation

## 2022-09-26 DIAGNOSIS — R519 Headache, unspecified: Secondary | ICD-10-CM | POA: Diagnosis not present

## 2022-09-26 DIAGNOSIS — I10 Essential (primary) hypertension: Secondary | ICD-10-CM | POA: Diagnosis not present

## 2022-09-26 LAB — BASIC METABOLIC PANEL
Anion gap: 14 (ref 5–15)
BUN: 19 mg/dL (ref 8–23)
CO2: 22 mmol/L (ref 22–32)
Calcium: 9.7 mg/dL (ref 8.9–10.3)
Chloride: 99 mmol/L (ref 98–111)
Creatinine, Ser: 1.22 mg/dL — ABNORMAL HIGH (ref 0.44–1.00)
GFR, Estimated: 47 mL/min — ABNORMAL LOW (ref >60)
Glucose, Bld: 151 mg/dL — ABNORMAL HIGH (ref 70–99)
Potassium: 3.3 mmol/L — ABNORMAL LOW (ref 3.5–5.1)
Sodium: 135 mmol/L (ref 135–145)

## 2022-09-26 LAB — CBC
HCT: 44.1 % (ref 36.0–46.0)
Hemoglobin: 14.6 g/dL (ref 12.0–15.0)
MCH: 29.9 pg (ref 26.0–34.0)
MCHC: 33.1 g/dL (ref 30.0–36.0)
MCV: 90.4 fL (ref 80.0–100.0)
Platelets: 297 10*3/uL (ref 150–400)
RBC: 4.88 MIL/uL (ref 3.87–5.11)
RDW: 13.2 % (ref 11.5–15.5)
WBC: 5.2 10*3/uL (ref 4.0–10.5)
nRBC: 0 % (ref 0.0–0.2)

## 2022-09-26 MED ORDER — PROCHLORPERAZINE EDISYLATE 10 MG/2ML IJ SOLN
10.0000 mg | Freq: Once | INTRAMUSCULAR | Status: AC
  Administered 2022-09-26: 10 mg via INTRAVENOUS
  Filled 2022-09-26: qty 2

## 2022-09-26 MED ORDER — SODIUM CHLORIDE 0.9 % IV BOLUS
500.0000 mL | Freq: Once | INTRAVENOUS | Status: AC
  Administered 2022-09-26: 500 mL via INTRAVENOUS

## 2022-09-26 MED ORDER — KETOROLAC TROMETHAMINE 15 MG/ML IJ SOLN
15.0000 mg | Freq: Once | INTRAMUSCULAR | Status: AC
  Administered 2022-09-26: 15 mg via INTRAVENOUS
  Filled 2022-09-26: qty 1

## 2022-09-26 MED ORDER — POTASSIUM CHLORIDE CRYS ER 20 MEQ PO TBCR
40.0000 meq | EXTENDED_RELEASE_TABLET | Freq: Once | ORAL | Status: DC
  Filled 2022-09-26: qty 2

## 2022-09-26 MED ORDER — DIPHENHYDRAMINE HCL 50 MG/ML IJ SOLN: 12.5000 mg | Freq: Once | INTRAMUSCULAR | Status: DC

## 2022-09-26 MED ORDER — DEXAMETHASONE SODIUM PHOSPHATE 10 MG/ML IJ SOLN: 10.0000 mg | Freq: Once | INTRAMUSCULAR | Status: DC

## 2022-09-26 NOTE — ED Provider Notes (Signed)
[facility] EMERGENCY DEPARTMENT Provider Note   CSN: [id number] Arrival date & time: 09/26/22  1143     History  Chief Complaint  Patient presents with   Headache    Eileen Cameron is a 71 y.o. female.  Patient with past medical history of anxiety, hypertension, and headaches presents today with complaints of headache. Endorses history of same over the past year. She states that this episode began last night and has been persistent since. She states that the pain is in the back of her head and radiates down her neck. She has been followed by neurology for same and has had MRI imaging that was unremarkable in 2/23. States that her headache is consistent with the same headache that she has had previously that has not improved with home medications. States she has been unable to sleep due to the pain. Denies any vision changes, head trauma, or photophobia.     The history is provided by the patient. No language interpreter was used.  Headache      Home Medications Prior to Admission medications   Medication Sig Start Date End Date Taking? Authorizing Provider  acetaminophen (TYLENOL) 500 MG tablet Take 500 mg by mouth every 6 (six) hours as needed for headache.    [provider]  Cholecalciferol (VITAMIN D3 PO) Take 1 tablet by mouth daily.    [provider]  diclofenac Sodium (VOLTAREN) 1 % GEL Apply 4 g topically 4 (four) times daily. 03/11/20   Lucious Groves, DO  esomeprazole (NEXIUM) 20 MG capsule Take 1 capsule (20 mg total) by mouth daily. 09/21/21 09/21/22  Orvis Brill, MD  fluticasone (FLONASE) 50 MCG/ACT nasal spray Place 2 sprays into both nostrils daily. Patient taking differently: Place 2 sprays into both nostrils daily as needed for allergies. 02/16/21 02/16/22  Aldine Contes, MD  hydrocortisone cream 1 % Apply to affected area 2 times daily 06/02/22 06/02/23  Lucious Groves, DO  meclizine (ANTIVERT) 25 MG tablet Take 1  tablet (25 mg total) by mouth 3 (three) times daily as needed for dizziness. 07/29/21   Lucious Groves, DO  melatonin 5 MG TABS Take 1 tablet (5 mg total) by mouth at bedtime as needed. 09/14/22   Lucious Groves, DO  Olmesartan-amLODIPine-HCTZ 40-5-25 MG TABS Take 1 tablet by mouth daily. 07/06/22   Lucious Groves, DO  Omega-3 Fatty Acids (FISH OIL PO) Take 1 capsule by mouth daily.    [provider]  UNABLE TO FIND daily. Med Name: Ashwagandha Vitamin 2 daily    [provider]  vitamin C (ASCORBIC ACID) 500 MG tablet Take 500 mg by mouth daily.    [provider]  zinc gluconate 50 MG tablet Take 50 mg by mouth every other day.    [provider]      Allergies    Ace inhibitors    Review of Systems   Review of Systems  Neurological:  Positive for headaches.  All other systems reviewed and are negative.   Physical Exam Updated Vital Signs BP 131/73 (BP Location: Left Arm)   Pulse 92   Temp 97.6 F (36.4 C) (Oral)   Resp 16   Ht '5\' 3"'$  (1.6 m)   Wt 83.5 kg   SpO2 99%   BMI 32.59 kg/m  Physical Exam Vitals and nursing note reviewed.  Constitutional:      General: She is not in acute distress.    Appearance: Normal appearance. She  is normal weight. She is not ill-appearing, toxic-appearing or diaphoretic.  HENT:     Head: Normocephalic and atraumatic.     Comments: No temporal artery tenderness Neck:     Meningeal: Brudzinski's sign and Kernig's sign absent.  Cardiovascular:     Rate and Rhythm: Normal rate.  Pulmonary:     Effort: Pulmonary effort is normal. No respiratory distress.  Musculoskeletal:        General: Normal range of motion.     Cervical back: Normal range of motion.  Skin:    General: Skin is warm and dry.  Neurological:     General: No focal deficit present.     Mental Status: She is alert and oriented to person, place, and time.     GCS: GCS eye subscore is 4. GCS verbal subscore is 5. GCS motor subscore is 6.      Sensory: Sensation is intact.     Motor: Motor function is intact.     Coordination: Coordination is intact.     Gait: Gait is intact.     Comments: Alert and oriented to self, place, time and event.    Speech is fluent, clear without dysarthria or dysphasia.    Strength 5/5 in upper/lower extremities   Sensation intact in upper/lower extremities    CN I not tested  CN II grossly intact visual fields bilaterally. Did not visualize posterior eye.  CN III, IV, VI PERRLA and EOMs intact bilaterally  CN V Intact sensation to sharp and light touch to the face  CN VII facial movements symmetric  CN VIII not tested  CN IX, X no uvula deviation, symmetric rise of soft palate  CN XI 5/5 SCM and trapezius strength bilaterally  CN XII Midline tongue protrusion, symmetric L/R movements   Psychiatric:        Mood and Affect: Mood normal.        Behavior: Behavior normal.     ED Results / Procedures / Treatments   Labs (all labs ordered are listed, but only abnormal results are displayed) Labs Reviewed  BASIC METABOLIC PANEL - Abnormal; Notable for the following components:      Result Value   Potassium 3.3 (*)    Glucose, Bld 151 (*)    Creatinine, Ser 1.22 (*)    GFR, Estimated 47 (*)    All other components within normal limits  CBC    EKG None  Radiology No results found.  Procedures Procedures    Medications Ordered in ED Medications  prochlorperazine (COMPAZINE) injection 10 mg (has no administration in time range)  diphenhydrAMINE (BENADRYL) injection 12.5 mg (has no administration in time range)  sodium chloride 0.9 % bolus 500 mL (has no administration in time range)  ketorolac (TORADOL) 15 MG/ML injection 15 mg (has no administration in time range)    ED Course/ Medical Decision Making/ A&P                           Medical Decision Making Amount and/or Complexity of Data Reviewed Labs: ordered.  Risk Prescription drug management.   This patient  is a 71 y.o. female who presents to the ED for concern of headache, this involves an extensive number of treatment options, and is a complaint that carries with it a high risk of complications and morbidity. The emergent differential diagnosis prior to evaluation includes, but is not limited to,  Intracranial hemorrhage, meningitis, CVA, intracranial tumor, venous sinus  thrombosis, migraine, cluster headache, hypertension, drug related, pseudotumor cerebri, AVM, head injury, tension headache, sinusitis, dental abscess, otitis media, TMJ, depression, temporal arteritis, glaucoma, trigeminal neuralgia.   This is not an exhaustive differential.   Past Medical History / Co-morbidities / Social History: Hx headaches over the past year, has seen neurology Dr. Rexene Alberts and had unremarkable work-up involving sleep study and brain MRI in February 2023. Upon chart review it appears Dr. Rexene Alberts suspected patients symptoms were due to tension headaches vs anxiety. Did not think patient was having migraines.  Physical Exam: Physical exam performed. The pertinent findings include: No focal neurologic deficits  Lab Tests: I ordered, and personally interpreted labs.  The pertinent results include:  K 3.3, creatinine slightly elevated at 1.22. No other acute laboratory findings    Medications: I ordered medication including compazine, benadryl, toradol, decadron, and fluids  for headache, dehydration. Reevaluation of the patient after these medicines showed that the patient resolved. I have reviewed the patients home medicines and have made adjustments as needed.  Of note, I ordered oral potassium which patient refused stating that she takes this daily and has some at home she will take.  Consultations Obtained:  Patient presents today with headache x 1 day. She is afebrile, non-toxic appearing, and in no acute distress with reassuring vital signs. She is also alert and oriented and neurologically intact without  focal deficits. Considered initial imaging for further evaluation, however upon chart review it appears this is a chronic issue that has had MRI imaging and neurology work-up previously. Therefore will give headache cocktail and then reassess   After evaluating all of the data points in this case, the presentation of Avaleen Brownley is NOT consistent with skull fracture, meningitis/encephalitis, SAH/sentinel bleed, Intracranial Hemorrhage (ICH) (subdural/epidural), acute obstructive hydrocephalus, space occupying lesions, CVA, Basilar/vertebral artery dissection, cerebral venous thrombosis, hypertensive emergency, temporal Arteritis, Idiopathic Intracranial Hypertension (pseudotumor cerebri).  After medications patient states that her symptoms feel completely resolved and she is ready to go home. She is therefore stable for discharge, educated on red flag symptoms that would prompt immediate return.  Also emphasized the importance of close PCP and neurology follow-up for continued evaluation and management of her chronic symptoms.  Patient understanding and amenable to plan, discharged in stable condition.  Strict return and follow-up precautions have been given by me personally or by detailed written instructions verbalized by nursing staff using the teach back method to patient/family/caregiver.  Data Reviewed/Counseling: I have reviewed the patient's vital signs, nursing notes, and other relevant tests/information. I had a detailed discussion regarding the historical points, exam findings, and any diagnostic results supporting the discharge diagnosis. I also discussed the need for outpatient follow-up and the need to return to the ED if symptoms worsen or if there are any questions or concerns that arise at home   This is a shared visit with supervising physician Dr. Tyrone Nine who has independently evaluated patient & provided guidance in evaluation/management/disposition, in agreement with care    Final  Clinical Impression(s) / ED Diagnoses Final diagnoses:  Acute nonintractable headache, unspecified headache type    Rx / DC Orders ED Discharge Orders     None     An After Visit Summary was printed and given to the patient.     Bud Face, PA-C 09/26/22 [place], [place], DO 09/26/22 1605

## 2022-09-26 NOTE — ED Notes (Signed)
Patient verbalizes understanding of discharge instructions. Opportunity for questioning and answers were provided. Pt discharged from ED. 

## 2022-09-26 NOTE — Discharge Instructions (Signed)
As we discussed, your workup in the ER today was reassuring for acute findings.  Given that your symptoms have resolved, you are therefore stable to be discharged.  I recommend that you call your neurologist at your earliest convenience to schedule an appointment for continued evaluation and management of her chronic symptoms.  I also recommend that you follow-up with your primary care doctor.  Return if development of any new or worsening symptoms.

## 2022-09-26 NOTE — ED Provider Triage Note (Signed)
Emergency Medicine Provider Triage Evaluation Note  Eileen Cameron , a 71 y.o. female  was evaluated in triage.  Pt complains of headache. States that same has been intermittent for the past several weeks but is now becoming more frequent. States that this headache started last night and has been persistent. Pain radiates down the back of the head and into the neck. Denies fevers, chills, vision changes, diplopia, nausea, or vomiting   Review of Systems  Positive:  Negative:  Physical Exam  BP 131/73 (BP Location: Left Arm)   Pulse 92   Temp 97.6 F (36.4 C) (Oral)   Resp 16   Ht '5\' 3"'$  (1.6 m)   Wt 83.5 kg   SpO2 99%   BMI 32.59 kg/m  Gen:   Awake, no distress   Resp:  Normal effort  MSK:   Moves extremities without difficulty  Other:  Alert and oriented, ambulatory with no focal deficits  Medical Decision Making  Medically screening exam initiated at 12:00 PM.  Appropriate orders placed.  Isabella Roemmich was informed that the remainder of the evaluation will be completed by another provider, this initial triage assessment does not replace that evaluation, and the importance of remaining in the ED until their evaluation is complete.     Bud Face, PA-C 09/26/22 1202

## 2022-09-26 NOTE — Telephone Encounter (Signed)
Patient called in c/o daily headaches. States one started in the middle of last night, went away but has since returned. Also c/o right sided face numbness. Denies vision changes, weakness, balance difficulties. States she is going to the ED.

## 2022-09-26 NOTE — ED Triage Notes (Signed)
Pt reports intermittent headaches for awhile but this headache started around 1 am today. Pain starts on back of head and goes around the side to her ears.

## 2022-09-27 ENCOUNTER — Telehealth: Payer: Self-pay

## 2022-09-27 NOTE — Patient Outreach (Signed)
  Care Coordination [facility] Note Transition Care Management Follow-up Telephone Call Date of discharge and from where: [facility] 09/26/22 How have you been since you were released from the hospital? "I feel much better than I did yesterday". Any questions or concerns? No  Items Reviewed: Did the pt receive and understand the discharge instructions provided? Yes  Medications obtained and verified? Yes  Other? No  Any new allergies since your discharge? No  Dietary orders reviewed? No Do you have support at home? Yes   Home Care and Equipment/Supplies: Were home health services ordered? no If so, what is the name of the agency? N/A  Has the agency set up a time to come to the patient's home? not applicable Were any new equipment or medical supplies ordered?  No What is the name of the medical supply agency? N/A Were you able to get the supplies/equipment? not applicable Do you have any questions related to the use of the equipment or supplies? No  Functional Questionnaire: (I = Independent and D = Dependent) ADLs: I  Bathing/Dressing- I  Meal Prep- I  Eating- I  Maintaining continence- I  Transferring/Ambulation- I  Managing Meds- I  Follow up appointments reviewed:  PCP Hospital f/u appt confirmed? No   Specialist Hospital f/u appt confirmed? No   Are transportation arrangements needed? No  If their condition worsens, is the pt aware to call PCP or go to the Emergency Dept.? Yes Was the patient provided with contact information for the PCP's office or ED? Yes Was to pt encouraged to call back with questions or concerns? Yes  SDOH assessments and interventions completed:   Yes  Care Coordination Interventions Activated:  Yes   Care Coordination Interventions:  PCP follow up appointment requested   Encounter Outcome:  Pt. Visit Completed

## 2022-10-23 NOTE — Progress Notes (Unsigned)
[facility] [address] [place]. [place] [address] [phone]       OFFICE FOLLOW UP NOTE  Ms. Daniesha Driver Date of Birth:  21-Jun-1951 Medical Record Number:  [id number]   Reason for visit: Initial CPAP follow-up  Virtual Visit via Video Note  I connected with Alessandra Grout on 10/23/22 at  3:45 PM EST by a video enabled telemedicine application and verified that I am speaking with the correct person using two identifiers.  Location: Patient: Home Provider: In office   I discussed the limitations of evaluation and management by telemedicine and the availability of in person appointments. The patient expressed understanding and agreed to proceed.    SUBJECTIVE:   CHIEF COMPLAINT:  Difficulty tolerating CPAP    HPI:   Update 10/24/2022 JM: Patient returns for 73-monthCPAP follow-up via MyChart video visit.  She has not used CPAP since November ***       History provided for reference purposes only Update 06/22/2022 JM: Patient returns for follow-up visit after prior consult visit with Dr. ARexene Alberts7 months ago for recurrent headaches which was felt to be consistent with tension type headaches.  Proceeded with MRI brain which did not show any underlying contributing factors.  Completed HST back in February which showed mild OSA with total AHI of 14.1/h with O2 nadir of 81%.  Recommend initiating AutoPap which was started on 5/18.  Review of compliance report as below showing minimal usage over the past 30 days.  She has had difficulty tolerating her nasal pillow mask as it irritates her nose and hurt her gums (where the mask is laying). Reports she reached out to [facility] weeks ago but is still awaiting for a return call. Reports when she tried to use, she was unable to fall asleep except for 1 time but woke up just a couple hours after.          Consult visit 11/28/2021 Dr. ARexene Alberts Ms. MScrewsis a 71year old right-handed woman with an  underlying medical history of anxiety, allergies, hypertension, hyperlipidemia, neck pain, and obesity, who reports an approximately 1 year history of recurrent headaches.  She has never been prone to headaches before, she does not have a history of migraines.  Headaches are in the back, base of her head and sometimes back of her head and sometimes in the front.  Not typically throbbing, mostly pressure-like.  No nausea or vomiting typically.  She feels that her headaches have flared up when her anxiety flared up, she is endorsing stress but no specific cause.  She lives alone, she has tried medication for anxiety but had side effects including with the BuSpar.  She is trying to work on stress and anxiety reduction on her own by way of praying and meditation.  She feels a little better in that regard.  She has not had any sudden onset of one-sided weakness or numbness or tingling or droopy face or slurring of speech.  Sometimes her legs hurt.  She has not had any vision loss or double vision or blurry vision but does have cataracts and is due for her eye exam.  She has not fallen.  Sometimes she takes Tylenol for the headache.  Headache frequency varies quite a bit.  She tries to hydrate.  She does not drink caffeine on a daily basis.  She does not drink any alcohol, she is a non-smoker.  She lives alone, she has 1 grown daughter and 3 grandchildren.  She is divorced.  She does not sleep well.  She snores, she felt that at the time of her sleep study a few years ago in our sleep lab, she did not sleep well at all, she does not sleep well outside her home.  She does not wake up rested.  She has middle of the night awakenings around 2 or 3 for mild, she gets out of bed and eats something and tries to go back to bed.   She monitors her blood pressure at home, it tends to be in the 140s to 150s over 80s typically.   I reviewed your office note from 09/21/2021.  Her blood pressure was elevated at the time.  She  had recently presented to the emergency room on 09/17/2021 with complaints of anxiety and feeling jittery.  She was noted to be hypertensive.  She was treated symptomatically with Ativan.  She presented to the emergency room on 09/16/2021 with a 1 day history of headaches in the back of her head.  She had a head CT without contrast on 09/16/2021 but I could not review the results, reportedly, CT was obtained and showed no evidence of acute abnormality.  She was treated symptomatically with IV fluids, diphenhydramine and metoclopramide.  She felt improved.  I had evaluated her several years ago for concern for sleep apnea.  She had a baseline sleep study on 02/03/2018 which showed a sleep efficiency of 59.7%, sleep latency delayed at 106 minutes, wake after sleep onset of 104.5 minutes.  Total AHI was 1.3/h, O2 nadir 87%.         ROS:   14 system review of systems performed and negative with exception of those listed in HPI  PMH:  Past Medical History:  Diagnosis Date   Adrenal incidentaloma ([place]) 07/08/2015   1.0 by 1.4 cm lateral limb left adrenal mass on CT abdomen on 07/08/15 Followed up in April 2018, stable no further imaging required.   Allergy    seasonal allergies, ace inhibitors--angioedema   Anxiety    Hyperlipidemia    Hypertension    Leg cramps 11/18/2015   Mold exposure 01/29/2020   Neck pain, musculoskeletal 12/27/2017   Post-nasal drip 10/10/2016   Right shoulder pain 04/20/2016    PSH:  Past Surgical History:  Procedure Laterality Date   COLONOSCOPY N/A 01/08/2013   Procedure: COLONOSCOPY;  Surgeon: Arta Silence, MD;  Location: [facility] ENDOSCOPY;  Service: Endoscopy;  Laterality: N/A;   ECTOPIC PREGNANCY SURGERY  1970s   IR RADIOLOGY PERIPHERAL GUIDED IV START  07/02/2018   IR US GUIDE VASC ACCESS RIGHT  07/02/2018   right breast abscess drainage     TONSILLECTOMY     TUBAL LIGATION      Social History:  Social History   Socioeconomic History   Marital status:  Divorced    Spouse name: Not on file   Number of children: 1   Years of education: 12   Highest education level: Not on file  Occupational History   Occupation: Retired     Fish farm manager: [employer]  Tobacco Use   Smoking status: Never    Passive exposure: Never   Smokeless tobacco: Never  Vaping Use   Vaping Use: Never used  Substance and Sexual Activity   Alcohol use: No    Alcohol/week: 0.0 standard drinks of alcohol   Drug use: No   Sexual activity: Not Currently  Other Topics Concern   Not on file  Social History Narrative   Current Social History 06/14/2020  Patient lives alone in a ground floor apartment which is 1 story. There are not steps up to the entrance the patient uses.       Patient's method of transportation is personal car.      The highest level of education was high school diploma.      The patient currently retired from [employer].      Identified important Relationships are "My grandchildren (3), my daughter."       Pets : None       Interests / Fun: "I walk, talk to my neighbors."       Current Stressors: "Fear of having panic attacks/anxiety."       Religious / Personal Beliefs: "I accept Jesus Christ as my Savior; I am saved."       L. Ducatte, BSN, RN-BC       Social Determinants of Health   Financial Resource Strain: Low Risk  (09/14/2022)   Overall Financial Resource Strain (CARDIA)    Difficulty of Paying Living Expenses: Not hard at all  Food Insecurity: No Food Insecurity (09/14/2022)   Hunger Vital Sign    Worried About Running Out of Food in the Last Year: Never true    Ran Out of Food in the Last Year: Never true  Transportation Needs: No Transportation Needs (09/27/2022)   PRAPARE - Hydrologist (Medical): No    Lack of Transportation (Non-Medical): No  Physical Activity: Not on file  Stress: Not on file  Social Connections: Not on file  Intimate Partner Violence: Not on file     Family History:  Family History  Problem Relation Age of Onset   Heart disease Mother 5   Diabetes Mother    Hypertension Sister    Hypertension Sister    Hypertension Sister    Hypertension Brother    Hypertension Brother    Heart disease Maternal Grandmother    Migraines Granddaughter     Medications:   Current Outpatient Medications on File Prior to Visit  Medication Sig Dispense Refill   acetaminophen (TYLENOL) 500 MG tablet Take 500 mg by mouth every 6 (six) hours as needed for headache.     Cholecalciferol (VITAMIN D3 PO) Take 1 tablet by mouth daily.     diclofenac Sodium (VOLTAREN) 1 % GEL Apply 4 g topically 4 (four) times daily. 100 g 3   esomeprazole (NEXIUM) 20 MG capsule Take 1 capsule (20 mg total) by mouth daily. 30 capsule 2   fluticasone (FLONASE) 50 MCG/ACT nasal spray Place 2 sprays into both nostrils daily. (Patient taking differently: Place 2 sprays into both nostrils daily as needed for allergies.) 16 g 11   hydrocortisone cream 1 % Apply to affected area 2 times daily 30 g 3   meclizine (ANTIVERT) 25 MG tablet Take 1 tablet (25 mg total) by mouth 3 (three) times daily as needed for dizziness. 30 tablet 0   melatonin 5 MG TABS Take 1 tablet (5 mg total) by mouth at bedtime as needed.  0   Olmesartan-amLODIPine-HCTZ 40-5-25 MG TABS Take 1 tablet by mouth daily. 90 tablet 3   Omega-3 Fatty Acids (FISH OIL PO) Take 1 capsule by mouth daily.     UNABLE TO FIND daily. Med Name: Ashwagandha Vitamin 2 daily     vitamin C (ASCORBIC ACID) 500 MG tablet Take 500 mg by mouth daily.     zinc gluconate 50 MG tablet Take 50 mg by mouth every other day.  No current facility-administered medications on file prior to visit.    Allergies:   Allergies  Allergen Reactions   Ace Inhibitors Swelling   Benadryl [Diphenhydramine] Anxiety      OBJECTIVE:  Physical Exam  General: well developed, well nourished, very pleasant elderly African-American female,  seated, in no evident distress  Neurologic Exam Mental Status: Awake and fully alert. Oriented to place and time. Recent and remote memory intact. Attention span, concentration and fund of knowledge appropriate. Mood and affect appropriate.       ASSESSMENT/PLAN: Loyola Santino is a 71 y.o. year old female    Mild OSA: Diagnosed per HST 12/2021 with overall AHI 14.1/h.  Has had difficulty tolerating CPAP mask, will place order for mask refitting.  Discussed importance of nightly CPAP use and ensuring greater than 4 hours for optimal benefit and per insurance requirements but preferably use throughout the entire duration of sleep and with any daytime napping.  Discussed ways to help with desensitization.  Continue to follow with DME company for any needed supplies or CPAP related concerns.     Follow up in 4 months or call earlier if needed   CC:  PCP: Lucious Groves, DO    I spent 23 minutes of face-to-face and non-face-to-face time with patient via MyChart video visit.  This included previsit chart review, lab review, study review, order entry, electronic health record documentation, patient education regarding diagnosis of sleep apnea with review and discussion of compliance report and answered all other questions to patient's satisfaction   Frann Rider, [facility]  [facility] [address] [place], [place] [address]  Phone [phone] Fax [phone] Note: This document was prepared with digital dictation and possible smart phrase technology. Any transcriptional errors that result from this process are unintentional.

## 2022-10-24 ENCOUNTER — Telehealth (INDEPENDENT_AMBULATORY_CARE_PROVIDER_SITE_OTHER): Payer: Medicare Other | Admitting: Adult Health

## 2022-10-24 ENCOUNTER — Encounter: Payer: Self-pay | Admitting: Adult Health

## 2022-10-24 DIAGNOSIS — G4733 Obstructive sleep apnea (adult) (pediatric): Secondary | ICD-10-CM | POA: Diagnosis not present

## 2022-10-27 ENCOUNTER — Emergency Department (HOSPITAL_BASED_OUTPATIENT_CLINIC_OR_DEPARTMENT_OTHER)
Admission: EM | Admit: 2022-10-27 | Discharge: 2022-10-27 | Disposition: A | Discharge status: Home / Self Care | Payer: Medicare Other | Attending: Emergency Medicine | Admitting: Emergency Medicine

## 2022-10-27 ENCOUNTER — Other Ambulatory Visit: Payer: Self-pay

## 2022-10-27 ENCOUNTER — Encounter (HOSPITAL_BASED_OUTPATIENT_CLINIC_OR_DEPARTMENT_OTHER): Payer: Self-pay | Admitting: Emergency Medicine

## 2022-10-27 ENCOUNTER — Emergency Department (HOSPITAL_BASED_OUTPATIENT_CLINIC_OR_DEPARTMENT_OTHER): Payer: Medicare Other

## 2022-10-27 DIAGNOSIS — W1812XA Fall from or off toilet with subsequent striking against object, initial encounter: Secondary | ICD-10-CM | POA: Diagnosis not present

## 2022-10-27 DIAGNOSIS — Y92002 Bathroom of unspecified non-institutional (private) residence single-family (private) house as the place of occurrence of the external cause: Secondary | ICD-10-CM | POA: Insufficient documentation

## 2022-10-27 DIAGNOSIS — R55 Syncope and collapse: Secondary | ICD-10-CM | POA: Diagnosis not present

## 2022-10-27 DIAGNOSIS — S0990XA Unspecified injury of head, initial encounter: Secondary | ICD-10-CM | POA: Diagnosis not present

## 2022-10-27 LAB — CBC WITH DIFFERENTIAL/PLATELET
Abs Immature Granulocytes: 0.01 10*3/uL (ref 0.00–0.07)
Basophils Absolute: 0 10*3/uL (ref 0.0–0.1)
Basophils Relative: 1 %
Eosinophils Absolute: 0 10*3/uL (ref 0.0–0.5)
Eosinophils Relative: 0 %
HCT: 37.3 % (ref 36.0–46.0)
Hemoglobin: 12.6 g/dL (ref 12.0–15.0)
Immature Granulocytes: 0 %
Lymphocytes Relative: 21 %
Lymphs Abs: 1.2 10*3/uL (ref 0.7–4.0)
MCH: 29.8 pg (ref 26.0–34.0)
MCHC: 33.8 g/dL (ref 30.0–36.0)
MCV: 88.2 fL (ref 80.0–100.0)
Monocytes Absolute: 0.7 10*3/uL (ref 0.1–1.0)
Monocytes Relative: 11 %
Neutro Abs: 4.1 10*3/uL (ref 1.7–7.7)
Neutrophils Relative %: 67 %
Platelets: 272 10*3/uL (ref 150–400)
RBC: 4.23 MIL/uL (ref 3.87–5.11)
RDW: 13.4 % (ref 11.5–15.5)
WBC: 6 10*3/uL (ref 4.0–10.5)
nRBC: 0 % (ref 0.0–0.2)

## 2022-10-27 LAB — TROPONIN I (HIGH SENSITIVITY): Troponin I (High Sensitivity): 6 ng/L (ref <18)

## 2022-10-27 LAB — BASIC METABOLIC PANEL
Anion gap: 11 (ref 5–15)
BUN: 30 mg/dL — ABNORMAL HIGH (ref 8–23)
CO2: 25 mmol/L (ref 22–32)
Calcium: 9.5 mg/dL (ref 8.9–10.3)
Chloride: 96 mmol/L — ABNORMAL LOW (ref 98–111)
Creatinine, Ser: 1.85 mg/dL — ABNORMAL HIGH (ref 0.44–1.00)
GFR, Estimated: 28 mL/min — ABNORMAL LOW (ref >60)
Glucose, Bld: 131 mg/dL — ABNORMAL HIGH (ref 70–99)
Potassium: 3.5 mmol/L (ref 3.5–5.1)
Sodium: 132 mmol/L — ABNORMAL LOW (ref 135–145)

## 2022-10-27 MED ORDER — LACTATED RINGERS IV BOLUS: 1000.0000 mL | Freq: Once | INTRAVENOUS | Status: DC

## 2022-10-27 NOTE — ED Provider Notes (Signed)
[facility] EMERGENCY DEPARTMENT Provider Note   CSN: [id number] Arrival date & time: 10/27/22  2027     History Chief Complaint  Patient presents with   Loss of Consciousness    HPI Eileen Cameron is a 71 y.o. female presenting for presyncope and fall off the toilet.  She states that she was straining to go to the bathroom when she reached to get something off of her toilet side table and woke up on the ground. She denies fevers or chills, nausea vomiting, syncope or shortness of breath.  Has a lump to her right forehead.  No headache at this time.  No blood thinner use.  No history of similar.  Back to baseline feeling at this time..   Patient's recorded medical, surgical, social, medication list and allergies were reviewed in the Snapshot window as part of the initial history.   Review of Systems   Review of Systems  Constitutional:  Negative for chills and fever.  HENT:  Negative for ear pain and sore throat.   Eyes:  Negative for pain and visual disturbance.  Respiratory:  Negative for cough and shortness of breath.   Cardiovascular:  Negative for chest pain and palpitations.  Gastrointestinal:  Positive for constipation. Negative for abdominal pain and vomiting.  Genitourinary:  Negative for dysuria and hematuria.  Musculoskeletal:  Negative for arthralgias and back pain.  Skin:  Negative for color change and rash.  Neurological:  Negative for seizures and syncope.  All other systems reviewed and are negative.   Physical Exam Updated Vital Signs BP 119/62   Pulse 63   Temp 97.8 F (36.6 C) (Oral)   Resp 18   Ht '5\' 3"'$  (1.6 m)   Wt 83.5 kg   SpO2 98%   BMI 32.59 kg/m  Physical Exam Vitals and nursing note reviewed.  Constitutional:      General: She is not in acute distress.    Appearance: She is well-developed.  HENT:     Head: Normocephalic and atraumatic.  Eyes:     Conjunctiva/sclera: Conjunctivae normal.  Cardiovascular:     Rate and  Rhythm: Normal rate and regular rhythm.     Heart sounds: No murmur heard. Pulmonary:     Effort: Pulmonary effort is normal. No respiratory distress.     Breath sounds: Normal breath sounds.  Abdominal:     General: There is no distension.     Palpations: Abdomen is soft.     Tenderness: There is no abdominal tenderness. There is no right CVA tenderness or left CVA tenderness.  Musculoskeletal:        General: Deformity (2cm swelling to forehead) present. No swelling or tenderness. Normal range of motion.     Cervical back: Neck supple.  Skin:    General: Skin is warm and dry.  Neurological:     General: No focal deficit present.     Mental Status: She is alert and oriented to person, place, and time. Mental status is at baseline.     Cranial Nerves: No cranial nerve deficit.      ED Course/ Medical Decision Making/ A&P    Procedures Procedures   Medications Ordered in ED Medications  lactated ringers bolus 1,000 mL (1,000 mLs Intravenous Patient Refused/Not Given 10/27/22 2147)   Medical Decision Making:    Eileen Cameron is a 71 y.o. female who presented to the ED today with a moderate mechanisma trauma, detailed above.    Patient's presentation is complicated by their  history of advanced age.  Patient placed on continuous vitals and telemetry monitoring while in ED which was reviewed periodically.   Given this mechanism of trauma, a full physical exam was performed. Notably, patient was HDS in NAD.   Reviewed and confirmed nursing documentation for past medical history, family history, social history.    Initial Assessment/Plan:   This is a patient presenting with a moderate mechanism trauma.  As such, I have considered intracranial injuries including intracranial hemorrhage, intrathoracic injuries including blunt myocardial or blunt lung injury, blunt abdominal injuries including aortic dissection, bladder injury, spleen injury, liver injury and I have considered  orthopedic injuries including extremity or spinal injury.  With the patient's presentation of moderate mechanism trauma but an otherwise reassuring exam, patient warrants targeted evaluation for potential traumatic injuries. Will proceed with targeted evaluation for potential injuries. Will proceed with CTH and screening lab eval.  With the patient's presentation of syncope, most likely diagnosis is orthostatic hypotension vs vasovagal episode. Other diagnoses were considered including (but not limited to) arrythmogenic syncope, valvular abnormality, PE, aortic dissection. These are considered less likely due to history of present illness and physical exam findings.   This is most consistent with an acute life/limb threatening illness complicated by underlying chronic conditions. In particular, concerning cardiac etiology, this is less likely to be the etiology given the lack of chest pain, lack of serious comorbidities includingheart failure orCAD.   Additionally, patient's history appears more consistent with benign episodes including orthostatic remains vagal episode based on her description of abdominal strain and actively reaching off of the toilet.  FAINT score can be utilized in syncope to predict cardiac event in the appropriate patient. Utilization of BNP/Troponin testing can predict cardiac events within 30 days.   This patient is a candidate for a faint score as she has a baseline low pretest probability for cardiac etiology. Will proceed with BNP/Troponin testing as below. If negative, then patient without risk factor for acute cardiac event causing her syncope.    Syncope Plan:   Screening labs including CBC and Metabolic panel to evaluate for infectious or metabolic etiology of disease.  Troponin/EKG to evaluate for cardiac pathology. Utilization of FAINT scoring detailed above.  Objective evaluation as below reviewed after administration of IVF/Telemetry monitoring  Final Assessment  and Plan:   I was called back to bedside as patient is requesting discharge.  She does not want to stay any longer.  I discussed the results we have back including her elevated creatinine.  This likely represents developing AKI and possible etiology of her syncope.  She has refused an IV for fluids stated that she will drink water.  She attributes her dehydration to depression in the outpatient setting and that she has not been eating or drinking as much.  She stated that she would drink lots of fluids and follow-up with her primary doctor in the outpatient setting.  I recommended admission for syncope evaluation however she declined stating that she would rather be home for the holidays and follow-up with her PCP.  Given her understanding of the risks of going home including fall or other injury, patient is stable for outpatient care and management at this time.  Disposition:  I have considered need for hospitalization, however, considering all of the above, I believe this patient is stable for discharge at this time.  Patient/family educated about specific return precautions for given chief complaint and symptoms.  Patient/family educated about follow-up with PCP.     Patient/family  expressed understanding of return precautions and need for follow-up. Patient spoken to regarding all imaging and laboratory results and appropriate follow up for these results. All education provided in verbal form with additional information in written form. Time was allowed for answering of patient questions. Patient discharged.    Emergency Department Medication Summary:   Medications  lactated ringers bolus 1,000 mL (1,000 mLs Intravenous Patient Refused/Not Given 10/27/22 2147)        Clinical Impression:  1. Syncope and collapse      Discharge  Clinical Impression:  1. Syncope and collapse      Discharge   Final Clinical Impression(s) / ED Diagnoses Final diagnoses:  Syncope and collapse    Rx /  DC Orders ED Discharge Orders     None         Tretha Sciara, MD 10/27/22 2257

## 2022-10-27 NOTE — ED Triage Notes (Signed)
Pt reports syncopal episode while trying to drink mylanta after straining to have a BM. Pt woke up on the floor with a bump on her head. Pt reports hx of same "years ago". Denies blood thinner use. Hematoma to R forehead. Skin intact. Pt denies any sx now other than pain to R forehead at hematoma site.

## 2022-10-29 ENCOUNTER — Telehealth: Payer: Self-pay | Admitting: Internal Medicine

## 2022-10-29 DIAGNOSIS — R42 Dizziness and giddiness: Secondary | ICD-10-CM

## 2022-10-29 MED ORDER — MECLIZINE HCL 25 MG PO TABS: 25.0000 mg | ORAL_TABLET | Freq: Three times a day (TID) | ORAL | 0 refills | Status: DC | PRN

## 2022-10-29 NOTE — Telephone Encounter (Signed)
Received page from this patient requesting a call back. The patient states that over the last few days her vertigo has been flaring up. She feels that her head is spinning and she has been dizzy. This is associated with nausea, but no vomiting. In the past when the patient has had these symptoms, she has received meclizine, which has helped to relieve her symptoms.  Of note, the patient was in the ED two days ago for syncope. She was found to have an AKI at that time and was advised to stay in the hospital for IV fluids, however, she declined. The patient states that since then, she has not had any further syncopal episodes and she has been staying hydrated (drank 9 bottles of water yesterday).  Will send the patient an rx for meclizine at this time and will also send our front desk a message to get the patient scheduled after the New Year with her PCP.    Buddy Duty, DO PGY-2

## 2022-11-02 ENCOUNTER — Encounter: Payer: Self-pay | Admitting: Student

## 2022-11-02 ENCOUNTER — Other Ambulatory Visit: Payer: Self-pay

## 2022-11-02 ENCOUNTER — Emergency Department (HOSPITAL_COMMUNITY): Payer: Medicare Other

## 2022-11-02 ENCOUNTER — Emergency Department (HOSPITAL_COMMUNITY)
Admission: EM | Admit: 2022-11-02 | Discharge: 2022-11-03 | Disposition: A | Discharge status: Home / Self Care | Payer: Medicare Other | Attending: Student | Admitting: Student

## 2022-11-02 ENCOUNTER — Ambulatory Visit (HOSPITAL_COMMUNITY)
Admission: AD | Admit: 2022-11-02 | Discharge: 2022-11-02 | Disposition: A | Discharge status: Home / Self Care | Payer: Medicare Other | Source: Intra-hospital | Attending: Psychiatry | Admitting: Psychiatry

## 2022-11-02 ENCOUNTER — Ambulatory Visit (INDEPENDENT_AMBULATORY_CARE_PROVIDER_SITE_OTHER): Payer: Medicare Other | Admitting: Student

## 2022-11-02 ENCOUNTER — Telehealth: Payer: Self-pay | Admitting: *Deleted

## 2022-11-02 DIAGNOSIS — I10 Essential (primary) hypertension: Secondary | ICD-10-CM | POA: Insufficient documentation

## 2022-11-02 DIAGNOSIS — F32A Depression, unspecified: Secondary | ICD-10-CM | POA: Insufficient documentation

## 2022-11-02 DIAGNOSIS — R45851 Suicidal ideations: Secondary | ICD-10-CM | POA: Diagnosis not present

## 2022-11-02 DIAGNOSIS — R1010 Upper abdominal pain, unspecified: Secondary | ICD-10-CM | POA: Insufficient documentation

## 2022-11-02 DIAGNOSIS — R110 Nausea: Secondary | ICD-10-CM | POA: Insufficient documentation

## 2022-11-02 DIAGNOSIS — R079 Chest pain, unspecified: Secondary | ICD-10-CM | POA: Diagnosis not present

## 2022-11-02 DIAGNOSIS — R55 Syncope and collapse: Secondary | ICD-10-CM | POA: Insufficient documentation

## 2022-11-02 DIAGNOSIS — Z79899 Other long term (current) drug therapy: Secondary | ICD-10-CM | POA: Diagnosis not present

## 2022-11-02 DIAGNOSIS — R42 Dizziness and giddiness: Secondary | ICD-10-CM | POA: Insufficient documentation

## 2022-11-02 DIAGNOSIS — Z0279 Encounter for issue of other medical certificate: Secondary | ICD-10-CM | POA: Diagnosis present

## 2022-11-02 DIAGNOSIS — F411 Generalized anxiety disorder: Secondary | ICD-10-CM | POA: Diagnosis not present

## 2022-11-02 DIAGNOSIS — F331 Major depressive disorder, recurrent, moderate: Secondary | ICD-10-CM | POA: Diagnosis present

## 2022-11-02 DIAGNOSIS — R440 Auditory hallucinations: Secondary | ICD-10-CM | POA: Diagnosis not present

## 2022-11-02 DIAGNOSIS — R112 Nausea with vomiting, unspecified: Secondary | ICD-10-CM | POA: Diagnosis not present

## 2022-11-02 DIAGNOSIS — R109 Unspecified abdominal pain: Secondary | ICD-10-CM | POA: Diagnosis not present

## 2022-11-02 DIAGNOSIS — R0602 Shortness of breath: Secondary | ICD-10-CM | POA: Diagnosis not present

## 2022-11-02 LAB — COMPREHENSIVE METABOLIC PANEL
ALT: 9 U/L (ref 0–44)
AST: 20 U/L (ref 15–41)
Albumin: 4.3 g/dL (ref 3.5–5.0)
Alkaline Phosphatase: 52 U/L (ref 38–126)
Anion gap: 9 (ref 5–15)
BUN: 13 mg/dL (ref 8–23)
CO2: 24 mmol/L (ref 22–32)
Calcium: 9.7 mg/dL (ref 8.9–10.3)
Chloride: 103 mmol/L (ref 98–111)
Creatinine, Ser: 0.99 mg/dL (ref 0.44–1.00)
GFR, Estimated: >60 mL/min (ref >60)
Glucose, Bld: 109 mg/dL — ABNORMAL HIGH (ref 70–99)
Potassium: 3.7 mmol/L (ref 3.5–5.1)
Sodium: 136 mmol/L (ref 135–145)
Total Bilirubin: 0.8 mg/dL (ref 0.3–1.2)
Total Protein: 7.7 g/dL (ref 6.5–8.1)

## 2022-11-02 LAB — CBC WITH DIFFERENTIAL/PLATELET
Abs Immature Granulocytes: 0.01 10*3/uL (ref 0.00–0.07)
Basophils Absolute: 0 10*3/uL (ref 0.0–0.1)
Basophils Relative: 1 %
Eosinophils Absolute: 0.1 10*3/uL (ref 0.0–0.5)
Eosinophils Relative: 1 %
HCT: 39.2 % (ref 36.0–46.0)
Hemoglobin: 13 g/dL (ref 12.0–15.0)
Immature Granulocytes: 0 %
Lymphocytes Relative: 38 %
Lymphs Abs: 2 10*3/uL (ref 0.7–4.0)
MCH: 30.3 pg (ref 26.0–34.0)
MCHC: 33.2 g/dL (ref 30.0–36.0)
MCV: 91.4 fL (ref 80.0–100.0)
Monocytes Absolute: 0.6 10*3/uL (ref 0.1–1.0)
Monocytes Relative: 12 %
Neutro Abs: 2.5 10*3/uL (ref 1.7–7.7)
Neutrophils Relative %: 48 %
Platelets: 261 10*3/uL (ref 150–400)
RBC: 4.29 MIL/uL (ref 3.87–5.11)
RDW: 13.9 % (ref 11.5–15.5)
WBC: 5.1 10*3/uL (ref 4.0–10.5)
nRBC: 0 % (ref 0.0–0.2)

## 2022-11-02 LAB — RAPID URINE DRUG SCREEN, HOSP PERFORMED
Amphetamines: NOT DETECTED
Barbiturates: NOT DETECTED
Benzodiazepines: NOT DETECTED
Cocaine: NOT DETECTED
Opiates: NOT DETECTED
Tetrahydrocannabinol: NOT DETECTED

## 2022-11-02 LAB — SALICYLATE LEVEL: Salicylate Lvl: <7 mg/dL — ABNORMAL LOW (ref 7.0–30.0)

## 2022-11-02 LAB — TROPONIN I (HIGH SENSITIVITY): Troponin I (High Sensitivity): 5 ng/L (ref <18)

## 2022-11-02 LAB — ETHANOL: Alcohol, Ethyl (B): <10 mg/dL (ref <10)

## 2022-11-02 LAB — ACETAMINOPHEN LEVEL: Acetaminophen (Tylenol), Serum: <10 ug/mL — ABNORMAL LOW (ref 10–30)

## 2022-11-02 NOTE — Telephone Encounter (Signed)
Can she take her over to the [facility] on [address]?  That may be the best place to evaluate this issue.

## 2022-11-02 NOTE — Telephone Encounter (Signed)
Patient was given a TeleHealth with Dr. Coy Saunas for this afternoon.

## 2022-11-02 NOTE — ED Triage Notes (Signed)
Pt reports about 3 weeks ago she had onset of pain just to the left of the center of her chest. Pain comes and goes. Pt says her anxiety makes the pain worse. Pt reports she has felt bloated since having her endo/colonscopy a month ago, pt is on antibiotic for a "stomach infection".    Pt arrives from [facility] for medical clearance, pt says that she has anxiety, says she is hearing voices. Pt says about 2 weeks ago she did have SI, but she prayed about it and her thoughts went away, denies previous SI.

## 2022-11-02 NOTE — Progress Notes (Signed)
   CC: Panic attack  This is a telephone encounter between Eileen Cameron, her daughter, Eileen Cameron and Eileen Cameron on 11/02/2022 for panic attack, crying spells and hallucinations. The visit was conducted with the patient located at home, daughter located at workplace and Eileen Cameron at [facility]. The patient's identity was confirmed using their DOB and current address. The patient and her daughter has consented to being evaluated through a telephone encounter and understands the associated risks (an examination cannot be done and the patient may need to come in for an appointment) / benefits (allows the patient to remain at home, decreasing exposure to coronavirus). I personally spent 20 minutes on medical discussion.   HPI:  Eileen Cameron is a 71 y.o. with PMH as below.   Please see A&P for assessment of the patient's acute and chronic medical conditions.   Past Medical History:  Diagnosis Date   Adrenal incidentaloma ([place]) 07/08/2015   1.0 by 1.4 cm lateral limb left adrenal mass on CT abdomen on 07/08/15 Followed up in [place] 2018, stable no further imaging required.   Allergy    seasonal allergies, ace inhibitors--angioedema   Anxiety    Hyperlipidemia    Hypertension    Leg cramps 11/18/2015   Mold exposure 01/29/2020   Neck pain, musculoskeletal 12/27/2017   Post-nasal drip 10/10/2016   Right shoulder pain 04/20/2016   Review of Systems: Positive for depressed mood, anxiety, panic attack, crying spells, auditory hallucinations. Negative for suicidal ideation/intention, homicidal ideation or delusions  Assessment & Plan:   See Encounters Tab for problem based charting.  Patient discussed with Eileen Cameron  Eileen Dibbles, MD, MPH

## 2022-11-02 NOTE — Progress Notes (Signed)
Pt presents as a walk-in to [facility] with daughter for complaints of "sucidal thoughts, tearfulness, wanting to be alone, anxiety, and unable to get out of bed". Pt also rates pain at an 8 in her chest. Describes chest pain as mid chest, radiating to her back and feeling of fullness and gas like pain. Pt states this has been on and off for 3 weeks. She recently had a fall and complains of head pain. Daughter states she was evaluated in the urgent care for the fall but pt states she did not tell them about her chest pain.   Garrison Columbus, NP, met with patient and daughter and asked her about her pain/symptoms. [name] recommends evaluation to rule out cardiac issues in the emergency department due to pain level and description of symptoms.   Daughter states she will drive her mother over to [facility]. Patient prefers to go with daughter and does not want to go alone with safe transport. NP approves transport with daughter. Report called to charge nurse at [facility] ED.   Clayborne Dana

## 2022-11-02 NOTE — Telephone Encounter (Signed)
Daughter calling stating that mother states that she does not want to be alone. Panic Attacks.  Repeating self a lot.  Acts as if she is talking to some when she goes to the bathroom alone.  Hearing voices that are telling her to harm herself. Vertigo meds Meclizine.  Daughter has called [facility].  Patient was previously seen there  for Sleep Apnea.   They informed daughter that she will need a referral and they will see the patient.  Patient will ball self up and have crying spells.   Has been having Anxiety Attacks.  Fell on 10/26/2022.  Was seen for dehydration.  Feels tightening up in chest. Has been to the ER x3 and does not want to take her back. [phone] daughters number.

## 2022-11-02 NOTE — Assessment & Plan Note (Addendum)
Patient evaluated via telephone visit today due to concern for her safety in the setting of worsening mental health. Per daughter, patient has had auditory hallucinations, panic attacks and crying spells over the last week. Last night, patient had another episode with auditory hallucinations that made her uncomfortable to be left alone. Daughter concern for possible suicidal ideations. On further questioning, patient states she hears voices that are telling her that God does not love her but they are not telling her to kill herself or kill anyone. She denies any suicidal ideations or homicidal ideations. She reports feeling bad with panic attacks described as pressure in her chest as well as "battle in my mind". Of note, patient has failed multiple antianxiety and antidepressive medications over the past 2 years. Last medication was venlafaxine a few months ago. Patient currently not on any antipsychotic, antidepressive or antianxiety medications.  Due to concern for patient's safety and a new auditory hallucinations, advised daughter to take patient to the behavioral health urgent care for further evaluation and follow up with clinic next week.  Plan: -Advised to go to her behavioral health urgent care -Will require referral to psychiatry for treatment-resistant anxiety and depression -Follow-up in the next 1 to 2 weeks

## 2022-11-02 NOTE — Assessment & Plan Note (Signed)
Patient has failed multiple antianxiety medications over the last year.  She continues to have occasional panic attacks with the most recent one last night.  Patient will require further evaluation by psychiatrist to help manage severe anxiety and panic attacks.  Advised to go to behavioral health urgent care due to concern for safety and new auditory hallucinations. -Behavioral health urgent care for evaluation -Clinic visit follow up in 1 to 2 weeks

## 2022-11-02 NOTE — ED Provider Triage Note (Signed)
Emergency Medicine Provider Triage Evaluation Note  Eileen Cameron , a 71 y.o. female  was evaluated in triage.  Pt complains of substernal chest pain has been ongoing for several weeks.  She was seen evaluated over the telephone today by her primary care doctor.  He was ultimately sent to [facility] for further evaluation.  Patient was evaluated at [facility] for her symptoms and sent to the ED to rule out cardiac etiologies.  Patient was endorsing suicidal ideations at the behavior with urgent care.  Review of Systems  Positive:  Negative: See above   Physical Exam  There were no vitals taken for this visit. Gen:   Awake, no distress   Resp:  Normal effort  MSK:   Moves extremities without difficulty  Other:    Medical Decision Making  Medically screening exam initiated at 8:42 PM.  Appropriate orders placed.  Eileen Cameron was informed that the remainder of the evaluation will be completed by another provider, this initial triage assessment does not replace that evaluation, and the importance of remaining in the ED until their evaluation is complete.     Myna Bright [place], [place] 11/02/22 2043

## 2022-11-03 ENCOUNTER — Emergency Department (HOSPITAL_COMMUNITY): Payer: Medicare Other

## 2022-11-03 ENCOUNTER — Encounter (HOSPITAL_COMMUNITY): Payer: Self-pay

## 2022-11-03 DIAGNOSIS — F32A Depression, unspecified: Secondary | ICD-10-CM | POA: Diagnosis not present

## 2022-11-03 DIAGNOSIS — R109 Unspecified abdominal pain: Secondary | ICD-10-CM | POA: Diagnosis not present

## 2022-11-03 DIAGNOSIS — F411 Generalized anxiety disorder: Secondary | ICD-10-CM

## 2022-11-03 LAB — TROPONIN I (HIGH SENSITIVITY): Troponin I (High Sensitivity): 4 ng/L (ref <18)

## 2022-11-03 MED ORDER — SODIUM CHLORIDE 0.9 % IV BOLUS
1000.0000 mL | Freq: Once | INTRAVENOUS | Status: AC
  Administered 2022-11-03: 1000 mL via INTRAVENOUS

## 2022-11-03 MED ORDER — REMERON 15 MG PO TABS: 7.5000 mg | ORAL_TABLET | Freq: Every day | ORAL | 0 refills | Status: DC

## 2022-11-03 MED ORDER — IOHEXOL 300 MG/ML  SOLN
100.0000 mL | Freq: Once | INTRAMUSCULAR | Status: AC | PRN
  Administered 2022-11-03: 100 mL via INTRAVENOUS

## 2022-11-03 MED ORDER — SODIUM CHLORIDE (PF) 0.9 % IJ SOLN
INTRAMUSCULAR | Status: AC
  Filled 2022-11-03: qty 50

## 2022-11-03 NOTE — Discharge Summary (Signed)
[facility] ED Discharge  11/03/2022 1:05 PM Eileen Cameron  MRN:  [id number]  Principal Problem: Generalized anxiety disorder Discharge Diagnoses: Principal Problem:   Generalized anxiety disorder Active Problems:   MDD (major depressive disorder), recurrent episode, moderate (HCC)   Auditory hallucination  Clinical Impression:  Final diagnoses:  Depression, unspecified depression type  Near syncope  Nausea    ED Assessment Time Calculation: Start Time: 7654 Stop Time: 1230 Total Time in Minutes (Assessment Completion): 45   Subjective: Eileen Cameron, 71 y.o., female patient seen face to face by this provider, consulted with Dr. Dwyane Dee; and chart reviewed on 11/03/22.  On evaluation Eileen Cameron reports that she was having some bad anxiety, the worse it has ever been. Her daughter and son in law were in the room with her and with the patient's permission is okay to speak in front of her daughter and son-in-law.  Daughter stated that patient has been taken antibiotics for almost a month for H.  Pylori.  Daughter is saying today that the antibiotics have something to do with why the mother is having some audio hallucinations, she stated that the voices began to become worse on December 22 said the voices are saying God does not love you and the voices were telling her mother to commit suicide.  Patient said that 1 day she came over and found mother in the bathroom in a dark crying with patient states that she was in the bathroom praying.  Patient lives by herself by her daughter and son-in-law do not live too far from her patient says she has only been living by herself for about 5 years and that the last 3 years have become harder for her, she says she does not get to see her grandkids like she used to and she does not have people to cook for what she used to.  Patient says that her appetite and sleep are poor appetite has become worse since taking antibiotics sleep has become worse because she  stays up all night with anxiety patient currently denies SI and HI and AVH.  Patient is calm and cooperative, states that she wants to talk to her therapist or psychiatrist she wants something to help her with anxiety and her appetite and depression.  Patient said that that she does get out of the house during the day she walks, and rides her exercise bike at home, her daughter feels like she needs to get out and maybe go to a senior center and meet friends.  Patient is calm and cooperative bright affect and mood she said the only thing that is bothering her now is that she continues to feel constipated which she has felt that way for almost a month per her and daughter, she had a colonoscopy last month.  Patient denies use of any illicit drugs or alcohol.  She says the only medicines that she is taking is the antibiotics, omeprazole and meclizine  During evaluation Eileen Cameron is laying in bed in no acute distress. She is alert, oriented x 4, calm, cooperative and attentive. Her mood is euthymic with congruent affect. She has normal speech, and behavior.  Objectively there is no evidence of psychosis/mania or delusional thinking. Patient is able to converse coherently, goal directed thoughts, no distractibility, or pre-occupation.  She also denies suicidal/self-harm/homicidal ideation, psychosis, and paranoia. Patient answered question appropriately.    Consulted with Dr. Dwyane Dee and agreed that patient is able to be discharged to family and that patient will follow  up with [facility] on today or Tuesday 11/07/22. Patient will also start on Remeron 7.5 mg PO at bedtime for MDD and appetite.   At this time Eileen Cameron is educated and verbalizes understanding of mental health resources and other crisis services in the community. She is instructed to call 911 and present to the nearest emergency room should she experience any suicidal/homicidal ideation,  auditory/visual/hallucinations, or detrimental worsening of her mental health condition. She was a also advised by Probation officer that she could call the toll-free phone on back of  insurance card to assist with identifying in network counselors and agencies or number on back of Medicaid card to speak with care coordinator   Past Psychiatric History: Anxiety   Past Medical History:  Past Medical History:  Diagnosis Date   Adrenal incidentaloma ([place]) 07/08/2015   1.0 by 1.4 cm lateral limb left adrenal mass on CT abdomen on 07/08/15 Followed up in April 2018, stable no further imaging required.   Allergy    seasonal allergies, ace inhibitors--angioedema   Anxiety    Hyperlipidemia    Hypertension    Leg cramps 11/18/2015   Mold exposure 01/29/2020   Neck pain, musculoskeletal 12/27/2017   Post-nasal drip 10/10/2016   Right shoulder pain 04/20/2016    Past Surgical History:  Procedure Laterality Date   COLONOSCOPY N/A 01/08/2013   Procedure: COLONOSCOPY;  Surgeon: Arta Silence, MD;  Location: [facility];  Service: Endoscopy;  Laterality: N/A;   ECTOPIC PREGNANCY SURGERY  1970s   IR RADIOLOGY PERIPHERAL GUIDED IV START  07/02/2018   IR US GUIDE VASC ACCESS RIGHT  07/02/2018   right breast abscess drainage     TONSILLECTOMY     TUBAL LIGATION     Family History:  Family History  Problem Relation Age of Onset   Heart disease Mother 65   Diabetes Mother    Hypertension Sister    Hypertension Sister    Hypertension Sister    Hypertension Brother    Hypertension Brother    Heart disease Maternal Grandmother    Migraines Granddaughter     Social History:  Social History   Substance and Sexual Activity  Alcohol Use No   Alcohol/week: 0.0 standard drinks of alcohol     Social History   Substance and Sexual Activity  Drug Use No    Social History   Socioeconomic History   Marital status: Divorced    Spouse name: Not on file   Number of children: 1   Years of education: 12    Highest education level: Not on file  Occupational History   Occupation: Retired     Fish farm manager: [employer]  Tobacco Use   Smoking status: Never    Passive exposure: Never   Smokeless tobacco: Never  Vaping Use   Vaping Use: Never used  Substance and Sexual Activity   Alcohol use: No    Alcohol/week: 0.0 standard drinks of alcohol   Drug use: No   Sexual activity: Not Currently  Other Topics Concern   Not on file  Social History Narrative   Current Social History 06/14/2020        Patient lives alone in a ground floor apartment which is 1 story. There are not steps up to the entrance the patient uses.       Patient's method of transportation is personal car.      The highest level of education was high school diploma.      The patient  currently retired from [employer].      Identified important Relationships are "My grandchildren (3), my daughter."       Pets : None       Interests / Fun: "I walk, talk to my neighbors."       Current Stressors: "Fear of having panic attacks/anxiety."       Religious / Personal Beliefs: "I accept Jesus Christ as my Savior; I am saved."       L. Ducatte, BSN, RN-BC       Social Determinants of Health   Financial Resource Strain: Low Risk  (09/14/2022)   Overall Financial Resource Strain (CARDIA)    Difficulty of Paying Living Expenses: Not hard at all  Food Insecurity: No Food Insecurity (09/14/2022)   Hunger Vital Sign    Worried About Running Out of Food in the Last Year: Never true    Ran Out of Food in the Last Year: Never true  Transportation Needs: No Transportation Needs (09/27/2022)   PRAPARE - Hydrologist (Medical): No    Lack of Transportation (Non-Medical): No  Physical Activity: Not on file  Stress: Not on file  Social Connections: Not on file    Tobacco Cessation:  N/A, patient does not currently use tobacco products  Current Medications: No current facility-administered  medications for this encounter.   Current Outpatient Medications  Medication Sig Dispense Refill   REMERON 15 MG tablet Take 0.5 tablets (7.5 mg total) by mouth at bedtime. 30 tablet 0   acetaminophen (TYLENOL) 500 MG tablet Take 500 mg by mouth every 6 (six) hours as needed for headache.     Cholecalciferol (VITAMIN D3 PO) Take 1 tablet by mouth daily.     diclofenac Sodium (VOLTAREN) 1 % GEL Apply 4 g topically 4 (four) times daily. 100 g 3   esomeprazole (NEXIUM) 20 MG capsule Take 1 capsule (20 mg total) by mouth daily. 30 capsule 2   fluticasone (FLONASE) 50 MCG/ACT nasal spray Place 2 sprays into both nostrils daily. (Patient taking differently: Place 2 sprays into both nostrils daily as needed for allergies.) 16 g 11   hydrocortisone cream 1 % Apply to affected area 2 times daily 30 g 3   meclizine (ANTIVERT) 25 MG tablet Take 1 tablet (25 mg total) by mouth 3 (three) times daily as needed for dizziness. 30 tablet 0   melatonin 5 MG TABS Take 1 tablet (5 mg total) by mouth at bedtime as needed.  0   Olmesartan-amLODIPine-HCTZ 40-5-25 MG TABS Take 1 tablet by mouth daily. 90 tablet 3   Omega-3 Fatty Acids (FISH OIL PO) Take 1 capsule by mouth daily.     UNABLE TO FIND daily. Med Name: Ashwagandha Vitamin 2 daily     vitamin C (ASCORBIC ACID) 500 MG tablet Take 500 mg by mouth daily.     zinc gluconate 50 MG tablet Take 50 mg by mouth every other day.     PTA Medications: (Not in a hospital admission)   [place] Scale:  [place] ED from 11/02/2022 in [place] DEPT ED from 10/27/2022 in [place] ED from 09/26/2022 in [place] Error: Q3, 4, or 5 should not be populated when Q2 is No No Risk No Risk       Musculoskeletal: Strength & Muscle Tone: within normal limits Gait & Station: normal Patient leans: N/A  Psychiatric Specialty Exam:  Presentation   General Appearance:  Appropriate for Environment  Eye Contact: Good  Speech: Clear and Coherent  Speech Volume: Normal  Handedness: Right   Mood and Affect  Mood: Euthymic  Affect: Appropriate   Thought Process  Thought Processes: Coherent  Descriptions of Associations:Intact  Orientation:Full (Time, Place and Person)  Thought Content:WDL  History of Schizophrenia/Schizoaffective disorder:No data recorded Duration of Psychotic Symptoms:No data recorded Hallucinations:Hallucinations: None  Ideas of Reference:None  Suicidal Thoughts:Suicidal Thoughts: No  Homicidal Thoughts:Homicidal Thoughts: No   Sensorium  Memory: Immediate Fair; Remote Fair  Judgment: Fair  Insight: Good   Executive Functions  Concentration: Good  Attention Span: Good  Recall: Good  Fund of Knowledge: Good  Language: Good   Psychomotor Activity  Psychomotor Activity: Psychomotor Activity: Normal   Assets  Assets: Communication Skills; Financial Resources/Insurance; Housing; Social Support   Sleep  Sleep: Sleep: Poor    Physical Exam: Physical Exam Cardiovascular:     Rate and Rhythm: Normal rate.  Pulmonary:     Effort: Pulmonary effort is normal.  Musculoskeletal:        General: Normal range of motion.  Neurological:     Mental Status: She is alert.  Psychiatric:        Attention and Perception: Attention normal.        Speech: Speech normal.        Behavior: Behavior is cooperative.        Thought Content: Thought content normal.        Cognition and Memory: Memory normal.        Judgment: Judgment normal.    Review of Systems  Constitutional: Negative.   HENT: Negative.    Respiratory: Negative.    Psychiatric/Behavioral: Negative.     Blood pressure 105/78, pulse 64, temperature 97.9 F (36.6 C), temperature source Oral, resp. rate 14, SpO2 98 %. There is no height or weight on file to calculate BMI.   Demographic Factors:   Age 28 or older  Loss Factors: Decline in physical health  Historical Factors: NA  Risk Reduction Factors:   Sense of responsibility to family, Religious beliefs about death, Positive social support, and Positive coping skills or problem solving skills  Continued Clinical Symptoms:  Severe Anxiety and/or Agitation Depression:   Delusional  Cognitive Features That Contribute To Risk:  None    Suicide Risk:  Minimal: No identifiable suicidal ideation.  Patients presenting with no risk factors but with morbid ruminations; may be classified as minimal risk based on the severity of the depressive symptoms   Follow-up [place]. Schedule an appointment as soon as possible for a visit today.   Specialty: Behavioral Health Why: IF not able to make an appointment today, please try again Tuesday for [facility], an outpatient program Contact information: [place] [phone]                Medical Decision Making: After thorough evaluation and review of information currently presented on assessment of patient, there is insufficient findings to indicate patient requires an inpatient level of care. Ms. Woolford is alert/oriented x4, organized; mood congruent with affect; and denies suicidal/self-harm/homicidal ideation, psychosis, and paranoia.  Currently she is not significantly impaired, psychotic, or manic on exam.  A detailed risk assessment has been completed based on clinical exam and individual risk factors. Patient acute suicide risk is low; and a safety plan has been created jointly which involves patient  following up.     At this time patient is educated and verbalizes understanding of mental health resources and other crisis services in the community. They are instructed to call 911 and present to the nearest emergency room should they experience any suicidal/homicidal ideation,  auditory/visual/hallucinations, or detrimental worsening of their mental health condition. Writer also advised the patient to call the toll-free phone on insurance card to assist with identifying in network counselors and agencies.  Patient is psychiatrically cleared.   Disposition: Disposition: Patient does not meet criteria for psychiatric inpatient admission.   [place], PMHNP 11/03/2022, 1:05 PM

## 2022-11-03 NOTE — ED Notes (Signed)
Pt changed into hospital gown and burgundy scrub pants. Pt wearing gown d/t cardiac monitor. Pt belonging's bags collected by daughter. Daughter at bedside. Charge RN aware.

## 2022-11-03 NOTE — ED Provider Notes (Signed)
Emergency Medicine Observation Re-evaluation Note  Eileen Cameron is a 71 y.o. female, seen on rounds today.  Pt initially presented to the ED for complaints of Chest Pain and Medical Clearance Currently, the patient is has been medically cleared, awaiting TTS for SI.    Physical Exam  BP 111/60   Pulse (!) 56   Temp 98.3 F (36.8 C) (Oral)   Resp 14   SpO2 97%  Physical Exam General: NAD  Cardiac: RRR, equal bilateral upper and lower extremity pulses Abd: No tenderness, soft Lungs: normal effort Psych: normal affect  ED Course / MDM  EKG:   I have reviewed the labs performed to date as well as medications administered while in observation.  Recent changes in the last 24 hours include arrival to ED, work up.  Reviewed her workup with family at bedside.  She reports she has had ongoing abdominal problems, was diagnosed with question H. pylori with Dr. Paulita Fujita and placed on Flagyl monotherapy.  She discontinued this as she felt that it was worsening her anxiety.  Over the last 3 weeks, she has had episodes of chest pain and back pain that seem to worsen after eating and come and go.  She had an episode of syncope on the 22nd and was evaluated in the emergency department.  Reports that she feels abdominal pain, followed by lightheadedness and syncope.  She has not been eating or drinking as much in the setting of having depression.  Her workup has been completed showing moderate stool burden on CT without other intra-abdominal abnormalities.  She has normal pulses bilaterally, intermittent symptoms for 3 weeks, no chest x-ray abnormalities, or abdominal CT abnormalities and have low suspicion for aortic dissection.  No dyspnea and have low suspicion for pulmonary embolus.  She has had prior coronary CT in 2019, Holter for PVCs in 2020.   Overall suspect vasovagal syncope in the setting of dehydration, and abdominal pain.  Per family she had several episodes as they were taking her from  the waiting room, and presume this was during the same episode.  Do not feel she requires admission for syncope.  Discussed with cardiology, and will recommend outpatient follow-up.  Also recommend outpatient follow-up with gastroenterology.  She is medically cleared awaiting psychiatric placement/plan.  Plan  Current plan is for awaiting psychiatric obs/placement.    Gareth Morgan, MD 11/03/22 1015

## 2022-11-03 NOTE — ED Provider Notes (Addendum)
[facility] DEPT Provider Note   CSN: [id number] Arrival date & time: 11/02/22  2003     History  Chief Complaint  Patient presents with   Chest Pain   Medical Clearance    Eileen Cameron is a 71 y.o. female.  Patient with history of hypertension, GERD/hiatal hernia, currently undergoing treatment for H. pylori per family, reports recent endoscopy and colonoscopy about a month ago -- presents to the emergency department from [facility] for medical clearance.  Patient has been having (Per [facility] note and confirmed here) "suicidal thoughts, tearfulness, wanting to be alone, anxiety, and unable to get out of bed'.  At that time she also reported chest pain and near syncopal episodes, prompting them to come to the emergency department for medical clearance.  In regards to her medical complaint, she has been having waves of extreme nausea associated with abdominal pain.  Shortly after feeling these, she gets very lightheaded.  She had a couple spells including one in triage here where her blood pressure dropped temporarily.  She denies any vomiting or diarrhea currently.  Abdominal pain is in the upper abdomen, but intermittent.  Patient's family over bedside states that patient tends to downplay her symptoms.      Home Medications Prior to Admission medications   Medication Sig Start Date End Date Taking? Authorizing Provider  acetaminophen (TYLENOL) 500 MG tablet Take 500 mg by mouth every 6 (six) hours as needed for headache.    [provider]  Cholecalciferol (VITAMIN D3 PO) Take 1 tablet by mouth daily.    [provider]  diclofenac Sodium (VOLTAREN) 1 % GEL Apply 4 g topically 4 (four) times daily. 03/11/20   Lucious Groves, DO  esomeprazole (NEXIUM) 20 MG capsule Take 1 capsule (20 mg total) by mouth daily. 09/21/21 09/21/22  Orvis Brill, MD  fluticasone (FLONASE) 50 MCG/ACT nasal spray Place 2 sprays into both nostrils daily. Patient  taking differently: Place 2 sprays into both nostrils daily as needed for allergies. 02/16/21 02/16/22  Aldine Contes, MD  hydrocortisone cream 1 % Apply to affected area 2 times daily 06/02/22 06/02/23  Lucious Groves, DO  meclizine (ANTIVERT) 25 MG tablet Take 1 tablet (25 mg total) by mouth 3 (three) times daily as needed for dizziness. 10/29/22   Atway, Rayann N, DO  melatonin 5 MG TABS Take 1 tablet (5 mg total) by mouth at bedtime as needed. 09/14/22   Lucious Groves, DO  Olmesartan-amLODIPine-HCTZ 40-5-25 MG TABS Take 1 tablet by mouth daily. 07/06/22   Lucious Groves, DO  Omega-3 Fatty Acids (FISH OIL PO) Take 1 capsule by mouth daily.    [provider]  UNABLE TO FIND daily. Med Name: Ashwagandha Vitamin 2 daily    [provider]  vitamin C (ASCORBIC ACID) 500 MG tablet Take 500 mg by mouth daily.    [provider]  zinc gluconate 50 MG tablet Take 50 mg by mouth every other day.    [provider]      Allergies    Ace inhibitors and Benadryl [diphenhydramine]    Review of Systems   Review of Systems  Physical Exam Updated Vital Signs BP 133/76 (BP Location: Right Arm)   Pulse 72   Temp 98.3 F (36.8 C) (Oral)   Resp 16   SpO2 97%  Physical Exam Vitals and nursing note reviewed.  Constitutional:      General: She is not in acute distress.  Appearance: She is well-developed.  HENT:     Head: Normocephalic and atraumatic.     Right Ear: External ear normal.     Left Ear: External ear normal.     Nose: Nose normal.  Eyes:     Conjunctiva/sclera: Conjunctivae normal.  Cardiovascular:     Rate and Rhythm: Normal rate and regular rhythm.     Heart sounds: No murmur heard. Pulmonary:     Effort: No respiratory distress.     Breath sounds: No wheezing, rhonchi or rales.  Abdominal:     Palpations: Abdomen is soft.     Tenderness: There is no abdominal tenderness. There is no guarding or rebound.  Musculoskeletal:     Cervical  back: Normal range of motion and neck supple.     Right lower leg: No edema.     Left lower leg: No edema.  Skin:    General: Skin is warm and dry.     Findings: No rash.  Neurological:     General: No focal deficit present.     Mental Status: She is alert. Mental status is at baseline.     Motor: No weakness.  Psychiatric:        Mood and Affect: Mood normal.    ED Results / Procedures / Treatments   Labs (all labs ordered are listed, but only abnormal results are displayed) Labs Reviewed  COMPREHENSIVE METABOLIC PANEL - Abnormal; Notable for the following components:      Result Value   Glucose, Bld 109 (*)    All other components within normal limits  ACETAMINOPHEN LEVEL - Abnormal; Notable for the following components:   Acetaminophen (Tylenol), Serum <10 (*)    All other components within normal limits  SALICYLATE LEVEL - Abnormal; Notable for the following components:   Salicylate Lvl <2.8 (*)    All other components within normal limits  ETHANOL  RAPID URINE DRUG SCREEN, HOSP PERFORMED  CBC WITH DIFFERENTIAL/PLATELET  TROPONIN I (HIGH SENSITIVITY)  TROPONIN I (HIGH SENSITIVITY)    ED ECG REPORT   Date: 11/03/2022  Rate: 74  Rhythm: normal sinus rhythm  QRS Axis: normal  Intervals: normal  ST/T Wave abnormalities: normal  Conduction Disutrbances:none  Narrative Interpretation:   Old EKG Reviewed: unchanged from 10/27/22  I have personally reviewed the EKG tracing and agree with the computerized printout as noted.   Radiology DG Chest 2 View  Result Date: 11/02/2022 CLINICAL DATA:  Short of breath, altered level of consciousness EXAM: CHEST - 2 VIEW COMPARISON:  02/03/2017 FINDINGS: The heart size and mediastinal contours are within normal limits. Both lungs are clear. The visualized skeletal structures are unremarkable. IMPRESSION: No active cardiopulmonary disease. Electronically Signed   By: Randa Ngo M.D.   On: 11/02/2022 21:15     Procedures Procedures    Medications Ordered in ED Medications  sodium chloride 0.9 % bolus 1,000 mL (has no administration in time range)    ED Course/ Medical Decision Making/ A&P    Patient seen and examined. History obtained directly from patient. Work-up including labs, imaging, EKG ordered in triage, if performed, were reviewed.    Labs/EKG: Independently reviewed and interpreted.  This included: CBC unremarkable; CMP glucose 109 otherwise unremarkable; ethanol less than 10; undetectable acetaminophen and salicylate; troponin normal at 5; drug screen negative.   Imaging: Independently visualized and interpreted.  This included: Chest x-ray, agree negative.  Medications/Fluids: None ordered  Most recent vital signs reviewed and are as follows: BP 133/76 (  BP Location: Right Arm)   Pulse 72   Temp 98.3 F (36.8 C) (Oral)   Resp 16   SpO2 97%   Initial impression: Patient with presyncope/syncope associated with waves of abdominal pain and nausea.  Seems very vasovagal in nature.  Cardiac workup is reassuring.  Normal liver function tests.  Will give fluid bolus and check CT of the abdomen and pelvis.  I looked, however I cannot see endoscopy results in epic.  Overall exam is reassuring.  Pending TTS evaluation.  3:41 AM CT imaging of the abdomen pelvis personally reviewed and interpreted, agree no acute findings.  6:00 AM Reassessment performed. Patient appears stable. Currently sleeping.  ontinuing to await TTS eval.   Patient is medically cleared. Feel that lightheaded episodes 2/2 nausea and likely vasovagal. GI and cardiac work-up negative for acute findings. Pt was hydrated with IV fluids.   Most current vital signs reviewed and are as follows: BP 111/60   Pulse (!) 56   Temp 98.3 F (36.8 C) (Oral)   Resp 14   SpO2 97%   Plan: TTS eval, dispo per reccs.                           Medical Decision Making Amount and/or Complexity of Data Reviewed Radiology:  ordered.  Risk Prescription drug management.   Pt here for medical clearance as well as psychiatric eval.   Abdominal symptoms have been ongoing, current H. Pylori treatment, recent endo/colonoscopy. For this patient's complaint of abdominal pain, the following conditions were considered on the differential diagnosis: gastritis/PUD, enteritis/duodenitis, appendicitis, cholelithiasis/cholecystitis, cholangitis, pancreatitis, ruptured viscus, colitis, diverticulitis, small/large bowel obstruction, proctitis, cystitis, pyelonephritis, ureteral colic, aortic dissection, aortic aneurysm. In women, ectopic pregnancy, pelvic inflammatory disease, ovarian cysts, and tubo-ovarian abscess were also considered. Atypical chest etiologies were also considered including ACS, PE, and pneumonia.   Syncope/near-syncope temporally related with abd pain/nausea. This is likely vasovagal in nature due to pain and nausea. BP recovers after.   For this patient's complaint, the following emergent conditions were considered on the differential diagnosis: acute coronary syndrome, pulmonary embolism, pneumothorax, myocarditis, pericardial tamponade, aortic dissection, thoracic aortic aneurysm complication, esophageal perforation.   Other causes were also considered including: gastroesophageal reflux disease, musculoskeletal pain including costochondritis, pneumonia/pleurisy, herpes zoster, pericarditis.  In regards to possibility of ACS, patient has atypical features of pain, non-ischemic and unchanged EKG and negative troponin(s).   In regards to possibility of PE, symptoms are atypical for PE and risk profile is low, making PE low likelihood.            Final Clinical Impression(s) / ED Diagnoses Final diagnoses:  Depression, unspecified depression type    Rx / DC Orders ED Discharge Orders     None          Carlisle Cater, PA-C 11/03/22 [id number]    Teressa Lower, MD 11/03/22 [id number]

## 2022-11-03 NOTE — Discharge Instructions (Signed)
Discharge recommendations:  Patient is to take medications as prescribed. Please see information for follow-up appointment with psychiatry and therapy. Please follow up with your primary care provider for all medical related needs.   Therapy: We recommend that patient participate in individual therapy to address mental health concerns.  Medications: The patient or guardian is to contact a medical professional and/or outpatient provider to address any new side effects that develop. The patient or guardian should update outpatient providers of any new medications and/or medication changes.   Atypical antipsychotics: If you are prescribed an atypical antipsychotic, it is recommended that your height, weight, BMI, blood pressure, fasting lipid panel, and fasting blood sugar be monitored by your outpatient providers.  Safety:  The patient should abstain from use of illicit substances/drugs and abuse of any medications. If symptoms worsen or do not continue to improve or if the patient becomes actively suicidal or homicidal then it is recommended that the patient return to the closest hospital emergency department, the [facility], or call 911 for further evaluation and treatment. National Suicide Prevention Lifeline 1-800-SUICIDE or 631-551-7152.  About 988 988 offers 24/7 access to trained crisis counselors who can help people experiencing mental health-related distress. People can call or text 988 or chat 988lifeline.org for themselves or if they are worried about a loved one who may need crisis support.  Crisis Mobile: [facility]:                     819-855-6487 (for crisis response 24 hours a day) [place]:                                            (667)526-9104

## 2022-11-03 NOTE — ED Notes (Signed)
Pt is A&O x 4, informed this Probation officer that any and all private health information can be shared with her family. Pt denies being suicidal. Pt states it was one instance, "Where the devil got in my head." Pt denies AVH.

## 2022-11-03 NOTE — ED Notes (Signed)
Pt needs 22 g or greater IV for CT  

## 2022-11-04 ENCOUNTER — Other Ambulatory Visit: Payer: Self-pay | Admitting: Psychiatry

## 2022-11-09 NOTE — Progress Notes (Signed)
Internal Medicine Clinic Attending  Case discussed with the resident at the time of the visit.  We reviewed the resident's history and exam and pertinent patient test results.  I agree with the assessment, diagnosis, and plan of care documented in the resident's note.  

## 2022-11-26 ENCOUNTER — Other Ambulatory Visit: Payer: Self-pay | Admitting: Internal Medicine

## 2022-11-26 DIAGNOSIS — R42 Dizziness and giddiness: Secondary | ICD-10-CM

## 2022-11-27 ENCOUNTER — Telehealth: Payer: Self-pay

## 2022-11-27 NOTE — Telephone Encounter (Signed)
Refill request sent to PCP.

## 2022-11-27 NOTE — Telephone Encounter (Signed)
Requesting refill on meclizine by today. Please call pt back.

## 2022-11-28 ENCOUNTER — Ambulatory Visit (INDEPENDENT_AMBULATORY_CARE_PROVIDER_SITE_OTHER): Payer: 59 | Admitting: Mental Health

## 2022-11-28 ENCOUNTER — Encounter: Payer: Self-pay | Admitting: Cardiovascular Disease

## 2022-11-28 ENCOUNTER — Encounter (HOSPITAL_COMMUNITY): Payer: Self-pay

## 2022-11-28 DIAGNOSIS — F331 Major depressive disorder, recurrent, moderate: Secondary | ICD-10-CM

## 2022-11-28 DIAGNOSIS — F411 Generalized anxiety disorder: Secondary | ICD-10-CM

## 2022-11-28 NOTE — Progress Notes (Unsigned)
Cardiology Office Note:    Date:  11/29/2022   ID:  Eileen Cameron, DOB 03/14/1951, MRN [id number]  PCP:  Lucious Groves, [place] Providers Cardiologist:  Socorro Kanitz  Click to update primary MD,subspecialty MD or APP then REFRESH:1}    Referring MD: Gareth Morgan, MD   Chief Complaint  Patient presents with   Leg Swelling   Chest Pain     History of Present Illness:    Eileen Cameron is a 72 y.o. female with a hx of depression, hypertension, hyperlipidemia leg swelling.  Was referred for eval of chest pain  Has CP - midsternal , radiates through to her back  The episodes seem to be associated with anxiety. She tries to walk on occasion.  These episodes of pain are not necessarily associated with exertion  Occasionally associated with eating   Last for hours - all day  Takes nexium  - no relief   Tries to eat a low salt diet  [name] - once a month  Family hx  + for cad  Grandparents had CAD in their 13s   Past Medical History:  Diagnosis Date   Adrenal incidentaloma ([place]) 07/08/2015   1.0 by 1.4 cm lateral limb left adrenal mass on CT abdomen on 07/08/15 Followed up in April 2018, stable no further imaging required.   Allergy    seasonal allergies, ace inhibitors--angioedema   Anxiety    Hyperlipidemia    Hypertension    Leg cramps 11/18/2015   Mold exposure 01/29/2020   Neck pain, musculoskeletal 12/27/2017   Post-nasal drip 10/10/2016   Right shoulder pain 04/20/2016    Past Surgical History:  Procedure Laterality Date   COLONOSCOPY N/A 01/08/2013   Procedure: COLONOSCOPY;  Surgeon: Arta Silence, MD;  Location: [facility];  Service: Endoscopy;  Laterality: N/A;   ECTOPIC PREGNANCY SURGERY  1970s   IR RADIOLOGY PERIPHERAL GUIDED IV START  07/02/2018   IR US GUIDE VASC ACCESS RIGHT  07/02/2018   right breast abscess drainage     TONSILLECTOMY     TUBAL LIGATION      Current Medications: Current Meds  Medication Sig   acetaminophen  (TYLENOL) 500 MG tablet Take 500 mg by mouth every 6 (six) hours as needed for headache.   Cholecalciferol (VITAMIN D3 PO) Take 1 tablet by mouth daily.   Cyanocobalamin (VITAMIN B-12 PO) Take by mouth.   diclofenac Sodium (VOLTAREN) 1 % GEL Apply 4 g topically 4 (four) times daily.   esomeprazole (NEXIUM) 20 MG capsule Take 1 capsule (20 mg total) by mouth daily.   fluticasone (FLONASE) 50 MCG/ACT nasal spray Place 2 sprays into both nostrils daily.   hydrocortisone cream 1 % Apply to affected area 2 times daily   meclizine (ANTIVERT) 25 MG tablet TAKE 1 TABLET(25 MG) BY MOUTH THREE TIMES DAILY AS NEEDED FOR DIZZINESS   melatonin 5 MG TABS Take 1 tablet (5 mg total) by mouth at bedtime as needed.   metoprolol tartrate (LOPRESSOR) 100 MG tablet Take 1 tablet by mouth two hours prior to scan   Olmesartan-amLODIPine-HCTZ 40-5-25 MG TABS Take 1 tablet by mouth daily.   Omega-3 Fatty Acids (FISH OIL PO) Take 1 capsule by mouth daily.   Pyridoxine HCl (VITAMIN B-6 PO) Take by mouth.   REMERON 15 MG tablet Take 0.5 tablets (7.5 mg total) by mouth at bedtime.   UNABLE TO FIND daily. Med Name: Ashwagandha Vitamin 2 daily   vitamin C (ASCORBIC ACID) 500 MG  tablet Take 500 mg by mouth daily.   zinc gluconate 50 MG tablet Take 50 mg by mouth every other day.     Allergies:   Ace inhibitors and Benadryl [diphenhydramine]   Social History   Socioeconomic History   Marital status: Divorced    Spouse name: Not on file   Number of children: 1   Years of education: 12   Highest education level: Not on file  Occupational History   Occupation: Retired     Fish farm manager: [employer]  Tobacco Use   Smoking status: Never    Passive exposure: Never   Smokeless tobacco: Never  Vaping Use   Vaping Use: Never used  Substance and Sexual Activity   Alcohol use: No    Alcohol/week: 0.0 standard drinks of alcohol   Drug use: No   Sexual activity: Not Currently  Other Topics Concern   Not on file   Social History Narrative   Current Social History 06/14/2020        Patient lives alone in a ground floor apartment which is 1 story. There are not steps up to the entrance the patient uses.       Patient's method of transportation is personal car.      The highest level of education was high school diploma.      The patient currently retired from [employer].      Identified important Relationships are "My grandchildren (3), my daughter."       Pets : None       Interests / Fun: "I walk, talk to my neighbors."       Current Stressors: "Fear of having panic attacks/anxiety."       Religious / Personal Beliefs: "I accept Jesus Christ as my Savior; I am saved."       L. Ducatte, BSN, RN-BC       Social Determinants of Health   Financial Resource Strain: Low Risk  (09/14/2022)   Overall Financial Resource Strain (CARDIA)    Difficulty of Paying Living Expenses: Not hard at all  Food Insecurity: No Food Insecurity (09/14/2022)   Hunger Vital Sign    Worried About Running Out of Food in the Last Year: Never true    Ran Out of Food in the Last Year: Never true  Transportation Needs: No Transportation Needs (09/27/2022)   PRAPARE - Hydrologist (Medical): No    Lack of Transportation (Non-Medical): No  Physical Activity: Inactive (11/28/2022)   Exercise Vital Sign    Days of Exercise per Week: 0 days    Minutes of Exercise per Session: 0 min  Stress: Stress Concern Present (11/28/2022)   [place]    Feeling of Stress : To some extent  Social Connections: Moderately Isolated (11/28/2022)   Social Connection and Isolation Panel [NHANES]    Frequency of Communication with Friends and Family: More than three times a week    Frequency of Social Gatherings with Friends and Family: Three times a week    Attends Religious Services: More than 4 times per year    Active Member of Clubs or  Organizations: No    Attends Archivist Meetings: Never    Marital Status: Divorced     Family History: The patient's family history includes Diabetes in her mother; Heart disease in her maternal grandmother; Heart disease (age of onset: 42) in her mother; Hypertension in her brother, brother, sister, sister,  and sister; Migraines in her granddaughter.  ROS:   Please see the history of present illness.     All other systems reviewed and are negative.  EKGs/Labs/Other Studies Reviewed:    The following studies were reviewed today:   EKG:   November 02, 2022: November 02, 2022: Normal sinus rhythm.  No ST or T wave changes.  Recent Labs: 07/06/2022: B Natriuretic Peptide 52.8; Magnesium 2.8 11/02/2022: ALT 9; BUN 13; Creatinine, Ser 0.99; Hemoglobin 13.0; Platelets 261; Potassium 3.7; Sodium 136  Recent Lipid Panel    Component Value Date/Time   CHOL 185 12/30/2020 1126   TRIG 82 12/30/2020 1126   HDL 62 12/30/2020 1126   CHOLHDL 3.0 12/30/2020 1126   CHOLHDL 3.7 05/13/2013 1504   VLDL 20 05/13/2013 1504   LDLCALC 108 (H) 12/30/2020 1126     Risk Assessment/Calculations:                Physical Exam:    VS:  BP 138/78   Pulse 68   Ht '5\' 3"'$  (1.6 m)   Wt 161 lb 3.2 oz (73.1 kg)   SpO2 97%   BMI 28.56 kg/m     Wt Readings from Last 3 Encounters:  11/29/22 161 lb 3.2 oz (73.1 kg)  10/27/22 184 lb (83.5 kg)  09/26/22 184 lb (83.5 kg)     GEN:  Well nourished, well developed in no acute distress HEENT: Normal NECK: No JVD; No carotid bruits LYMPHATICS: No lymphadenopathy CARDIAC: RRR, no murmurs, rubs, gallops RESPIRATORY:  Clear to auscultation without rales, wheezing or rhonchi  ABDOMEN: Soft, non-tender, non-distended MUSCULOSKELETAL:  No edema; No deformity  SKIN: Warm and dry NEUROLOGIC:  Alert and oriented x 3 PSYCHIATRIC:  Normal affect   ASSESSMENT:    1. Mixed hyperlipidemia   2. Primary hypertension   3. Chest pain of uncertain  etiology   4. Pre-procedural cardiovascular examination    PLAN:      Chest discomfort: Mrs. Parchment presents for further evaluation of chest discomfort.  Some characteristics of this chest pain are somewhat worrisome and that it is midsternal with radiation through to her back.  Does not necessarily occur with exertion.  She does have a positive family history of coronary artery disease.  She has a personal history of hypertension and hyperlipidemia.  Will schedule her for a coronary CT angiogram for further evaluation. Will see her back on an as-needed basis based on the results of the coronary CTA.  2.  Hypertension: Blood pressure is well-controlled.  Continue current medications.  Will have her follow-up with her primary medical doctor.  2.  Hyperlipidemia: Lipid levels are mildly elevated.  Will be able to assess this further following the coronary CT angiogram.         Medication Adjustments/Labs and Tests Ordered: Current medicines are reviewed at length with the patient today.  Concerns regarding medicines are outlined above.  Orders Placed This Encounter  Procedures   CT CORONARY MORPH W/CTA COR W/SCORE W/CA W/CM &/OR WO/CM   Basic metabolic panel   Meds ordered this encounter  Medications   metoprolol tartrate (LOPRESSOR) 100 MG tablet    Sig: Take 1 tablet by mouth two hours prior to scan    Dispense:  1 tablet    Refill:  0    Patient Instructions  Medication Instructions:  Your physician recommends that you continue on your current medications as directed. Please refer to the Current Medication list given to you today.  *If  you need a refill on your cardiac medications before your next appointment, please call your pharmacy*  Lab Work: BMET today If you have labs (blood work) drawn today and your tests are completely normal, you will receive your results only by: [place] (if you have MyChart) OR A paper copy in the mail If you have any lab test that  is abnormal or we need to change your treatment, we will call you to review the results.  Testing/Procedures: Coronary CT Angiogram Your physician has requested that you have cardiac CT. Cardiac computed tomography (CT) is a painless test that uses an x-ray machine to take clear, detailed pictures of your heart. For further information please visit [email or web address].[email or web address]. Please follow instruction sheet as given.  Follow-Up: At [facility], you and your health needs are our priority.  As part of our continuing mission to provide you with exceptional heart care, we have created designated Provider Care Teams.  These Care Teams include your primary Cardiologist (physician) and Advanced Practice Providers (APPs -  Physician Assistants and Nurse Practitioners) who all work together to provide you with the care you need, when you need it.  Your next appointment:   As Needed  Provider:   Mertie Moores, MD      Other Instructions   Your cardiac CT will be scheduled at one of the below locations:   [facility] [address] [place], [place] [address] [phone]  please arrive at the [facility] and Children's Entrance (Entrance C2) of [facility] 30 minutes prior to test start time. You can use the FREE valet parking offered at entrance C (encouraged to control the heart rate for the test)  Proceed to the [facility] Radiology Department (first floor) to check-in and test prep.  All radiology patients and guests should use entrance C2 at [facility], accessed from [facility], even though the hospital's physical address listed is [address].     Please follow these instructions carefully (unless otherwise directed):  On the Night Before the Test: Be sure to Drink plenty of water. Do not consume any caffeinated/decaffeinated beverages or chocolate 12 hours prior to your test. Do not take any antihistamines 12 hours prior to your  test.  On the Day of the Test: Drink plenty of water until 1 hour prior to the test. Do not eat any food 1 hour prior to test. You may take your regular medications prior to the test.  Take metoprolol (Lopressor) two hours prior to test. HOLD Olmesartan/Amlodipine/Hydrochlorothiazide morning of the test. FEMALES- please wear underwire-free bra if available, avoid dresses & tight clothing   After the Test: Drink plenty of water. After receiving IV contrast, you may experience a mild flushed feeling. This is normal. On occasion, you may experience a mild rash up to 24 hours after the test. This is not dangerous. If this occurs, you can take Benadryl 25 mg and increase your fluid intake. If you experience trouble breathing, this can be serious. If it is severe call 911 IMMEDIATELY. If it is mild, please call our office. If you take any of these medications: Glipizide/Metformin, Avandament, Glucavance, please do not take 48 hours after completing test unless otherwise instructed.  We will call to schedule your test 2-4 weeks out understanding that some insurance companies will need an authorization prior to the service being performed.   For non-scheduling related questions, please contact the cardiac imaging nurse navigator should you have any questions/concerns:  Marchia Bond, Cardiac Imaging Nurse Navigator Gordy Clement, Cardiac Imaging Nurse Navigator [facility] Services Direct Office Dial: [phone]   For scheduling needs, including cancellations and rescheduling, please call [place], [phone].    Signed, Mertie Moores, MD  11/29/2022 5:47 PM    [place]

## 2022-11-29 ENCOUNTER — Encounter: Payer: Self-pay | Admitting: Cardiovascular Disease

## 2022-11-29 ENCOUNTER — Ambulatory Visit: Payer: 59 | Attending: Cardiovascular Disease | Admitting: Cardiovascular Disease

## 2022-11-29 VITALS — BP 138/78 | HR 68 | Ht 63.0 in | Wt 161.2 lb

## 2022-11-29 DIAGNOSIS — Z01810 Encounter for preprocedural cardiovascular examination: Secondary | ICD-10-CM

## 2022-11-29 DIAGNOSIS — E782 Mixed hyperlipidemia: Secondary | ICD-10-CM

## 2022-11-29 DIAGNOSIS — R079 Chest pain, unspecified: Secondary | ICD-10-CM

## 2022-11-29 DIAGNOSIS — I10 Essential (primary) hypertension: Secondary | ICD-10-CM

## 2022-11-29 MED ORDER — METOPROLOL TARTRATE 100 MG PO TABS: ORAL_TABLET | ORAL | 0 refills | Status: DC

## 2022-11-29 NOTE — Patient Instructions (Addendum)
Medication Instructions:  Your physician recommends that you continue on your current medications as directed. Please refer to the Current Medication list given to you today.  *If you need a refill on your cardiac medications before your next appointment, please call your pharmacy*  Lab Work: BMET today If you have labs (blood work) drawn today and your tests are completely normal, you will receive your results only by: [place] (if you have MyChart) OR A paper copy in the mail If you have any lab test that is abnormal or we need to change your treatment, we will call you to review the results.  Testing/Procedures: Coronary CT Angiogram Your physician has requested that you have cardiac CT. Cardiac computed tomography (CT) is a painless test that uses an x-ray machine to take clear, detailed pictures of your heart. For further information please visit [email or web address][facility][email or web address]. Please follow instruction sheet as given.  Follow-Up: At [facility], you and your health needs are our priority.  As part of our continuing mission to provide you with exceptional heart care, we have created designated Provider Care Teams.  These Care Teams include your primary Cardiologist (physician) and Advanced Practice Providers (APPs -  Physician Assistants and Nurse Practitioners) who all work together to provide you with the care you need, when you need it.  Your next appointment:   As Needed  Provider:   Mertie Moores, MD      Other Instructions   Your cardiac CT will be scheduled at one of the below locations:   [facility] [address] [place], [place] [address] [phone]  please arrive at the [facility] and Children's Entrance (Entrance C2) of [facility] 30 minutes prior to test start time. You can use the FREE valet parking offered at entrance C (encouraged to control the heart rate for the test)  Proceed to the [facility] Radiology Department (first floor)  to check-in and test prep.  All radiology patients and guests should use entrance C2 at [facility], accessed from [facility], even though the hospital's physical address listed is [address].     Please follow these instructions carefully (unless otherwise directed):  On the Night Before the Test: Be sure to Drink plenty of water. Do not consume any caffeinated/decaffeinated beverages or chocolate 12 hours prior to your test. Do not take any antihistamines 12 hours prior to your test.  On the Day of the Test: Drink plenty of water until 1 hour prior to the test. Do not eat any food 1 hour prior to test. You may take your regular medications prior to the test.  Take metoprolol (Lopressor) two hours prior to test. HOLD Olmesartan/Amlodipine/Hydrochlorothiazide morning of the test. FEMALES- please wear underwire-free bra if available, avoid dresses & tight clothing   After the Test: Drink plenty of water. After receiving IV contrast, you may experience a mild flushed feeling. This is normal. On occasion, you may experience a mild rash up to 24 hours after the test. This is not dangerous. If this occurs, you can take Benadryl 25 mg and increase your fluid intake. If you experience trouble breathing, this can be serious. If it is severe call 911 IMMEDIATELY. If it is mild, please call our office. If you take any of these medications: Glipizide/Metformin, Avandament, Glucavance, please do not take 48 hours after completing test unless otherwise instructed.  We will call to schedule your test 2-4 weeks out understanding that some insurance companies will  need an authorization prior to the service being performed.   For non-scheduling related questions, please contact the cardiac imaging nurse navigator should you have any questions/concerns: Marchia Bond, Cardiac Imaging Nurse Navigator Gordy Clement, Cardiac Imaging Nurse Navigator [place] [facility]  [facility] Direct Office Dial: [phone]   For scheduling needs, including cancellations and rescheduling, please call [place], [phone].

## 2022-11-30 ENCOUNTER — Encounter: Payer: Self-pay | Admitting: Internal Medicine

## 2022-11-30 ENCOUNTER — Telehealth: Payer: Self-pay | Admitting: *Deleted

## 2022-11-30 ENCOUNTER — Ambulatory Visit (INDEPENDENT_AMBULATORY_CARE_PROVIDER_SITE_OTHER): Payer: 59 | Admitting: Internal Medicine

## 2022-11-30 VITALS — BP 147/73 | HR 74 | Temp 98.1°F | Ht 63.0 in | Wt 161.6 lb

## 2022-11-30 DIAGNOSIS — F411 Generalized anxiety disorder: Secondary | ICD-10-CM | POA: Diagnosis not present

## 2022-11-30 DIAGNOSIS — I10 Essential (primary) hypertension: Secondary | ICD-10-CM

## 2022-11-30 DIAGNOSIS — R42 Dizziness and giddiness: Secondary | ICD-10-CM | POA: Diagnosis not present

## 2022-11-30 LAB — BASIC METABOLIC PANEL
BUN/Creatinine Ratio: 11 — ABNORMAL LOW (ref 12–28)
BUN: 10 mg/dL (ref 8–27)
CO2: 24 mmol/L (ref 20–29)
Calcium: 9.9 mg/dL (ref 8.7–10.3)
Chloride: 100 mmol/L (ref 96–106)
Creatinine, Ser: 0.92 mg/dL (ref 0.57–1.00)
Glucose: 80 mg/dL (ref 70–99)
Potassium: 4.2 mmol/L (ref 3.5–5.2)
Sodium: 143 mmol/L (ref 134–144)
eGFR: 66 mL/min/{1.73_m2} (ref >59)

## 2022-11-30 MED ORDER — OLMESARTAN-AMLODIPINE-HCTZ 40-5-25 MG PO TABS: 1.0000 | ORAL_TABLET | Freq: Every day | ORAL | 3 refills | Status: DC

## 2022-11-30 MED ORDER — SERTRALINE HCL 25 MG PO TABS: 25.0000 mg | ORAL_TABLET | Freq: Every day | ORAL | 2 refills | Status: DC
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
## 2022-11-30 NOTE — Telephone Encounter (Signed)
Would recommend taking in the morning, may start today or tomorrow.

## 2022-11-30 NOTE — Telephone Encounter (Signed)
Patient called in wanting to know what time of day she should take sertraline and when should she start it. States she will not take Remeron tonight.

## 2022-11-30 NOTE — Telephone Encounter (Signed)
Patient notified to take in the morning. She will start tomorrow AM.

## 2022-12-01 NOTE — Assessment & Plan Note (Signed)
Will retry sertraline at 25 mg.  Plan to see her back in 6 weeks.  Stop mirtazapine.  Continue to follow with behavioral health counseling.

## 2022-12-01 NOTE — Assessment & Plan Note (Signed)
She is not ready to go to vestibular rehab but I have placed a referral.

## 2022-12-01 NOTE — Progress Notes (Signed)
Established Patient Office Visit  Subjective   Patient ID: Eileen Cameron, female    DOB: June 29, 1951  Age: 72 y.o. MRN: [id number]  Chief Complaint  Patient presents with   ED F/U Visit    Anxiety.   Recent fall   Dizziness   Eileen Cameron presents today for follow-up for anxiety.  She is accompanied by her daughter Eileen Cameron.  While she has experienced generalized anxiety for quite some time recently this has become worse and was associated with a few panic attacks over the holidays.  She ended up needing to go to the hospital where she was having some anxiety induced "blackouts."  She was evaluated in the emergency department and sent a prescription for low-dose mirtazapine.  We have previously tried multiple SSRI however usually she has not taken more than a single dose due to anxiety about the package insert and possible side effects.  She is now most very motivated to take medication and her daughter is more involved in making sure that she does.  The mirtazapine was quite expensive and not covered by insurance they had to pay $200 out-of-pocket in addition she is having some excessive daytime fatigue even though she is taking the mirtazapine at night.  They are interested in alternative medication and are willing to try things that we have prescribed before.  Additionally she has had some episodes of some chest discomfort she was seen by cardiology and have a plan for cardiac CT that is scheduled for later this week.  She is still experiencing intermittent vertigo associated with BPPV.  We have previously discussed vestibular rehab but she had not been interested however she is now motivated to go she is receiving some benefit from meclizine as needed.     Objective:     BP (!) 147/73 (BP Location: Right Arm, Cuff Size: Normal)   Pulse 74   Temp 98.1 F (36.7 C) (Oral)   Ht '5\' 3"'$  (1.6 m)   Wt 161 lb 9.6 oz (73.3 kg)   SpO2 100% Comment: RA  BMI 28.63 kg/m  BP Readings from Last 3  Encounters:  11/30/22 (!) 147/73  11/29/22 138/78  11/03/22 137/62   Wt Readings from Last 3 Encounters:  11/30/22 161 lb 9.6 oz (73.3 kg)  11/29/22 161 lb 3.2 oz (73.1 kg)  10/27/22 184 lb (83.5 kg)      Physical Exam Vitals and nursing note reviewed.  Constitutional:      Appearance: Normal appearance.  Pulmonary:     Effort: Pulmonary effort is normal.  Skin:    General: Skin is warm and dry.  Neurological:     Mental Status: She is alert.  Psychiatric:        Mood and Affect: Mood normal.        Behavior: Behavior normal.        Thought Content: Thought content normal.      No results found for any visits on 11/30/22.  Last CBC Lab Results  Component Value Date   WBC 5.1 11/02/2022   HGB 13.0 11/02/2022   HCT 39.2 11/02/2022   MCV 91.4 11/02/2022   MCH 30.3 11/02/2022   RDW 13.9 11/02/2022   PLT 261 34/19/3790   Last metabolic panel Lab Results  Component Value Date   GLUCOSE 80 11/29/2022   NA 143 11/29/2022   K 4.2 11/29/2022   CL 100 11/29/2022   CO2 24 11/29/2022   BUN 10 11/29/2022   CREATININE 0.92 11/29/2022   EGFR  66 11/29/2022   CALCIUM 9.9 11/29/2022   PHOS 3.4 08/10/2019   PROT 7.7 11/02/2022   ALBUMIN 4.3 11/02/2022   LABGLOB 2.8 02/09/2022   AGRATIO 1.7 02/09/2022   BILITOT 0.8 11/02/2022   ALKPHOS 52 11/02/2022   AST 20 11/02/2022   ALT 9 11/02/2022   ANIONGAP 9 11/02/2022   Last hemoglobin A1c Lab Results  Component Value Date   HGBA1C 5.9 02/07/2010      The 10-year ASCVD risk score (Arnett DK, et al., 2019) is: 16.6%    Assessment & Plan:   Problem List Items Addressed This Visit       Cardiovascular and Mediastinum   Essential hypertension (Chronic)    Blood pressure is at goal yesterday at cardiology office she did not take blood pressure medications yet this morning we will not make any changes.      Relevant Medications   Olmesartan-amLODIPine-HCTZ 40-5-25 MG TABS     Other   Generalized anxiety  disorder (Chronic)    Will retry sertraline at 25 mg.  Plan to see her back in 6 weeks.  Stop mirtazapine.  Continue to follow with behavioral health counseling.      Relevant Medications   sertraline (ZOLOFT) 25 MG tablet   Vertigo - Primary    She is not ready to go to vestibular rehab but I have placed a referral.      Relevant Orders   Ambulatory referral to Physical Therapy    Return in about 6 weeks (around 01/11/2023).    Lucious Groves, DO

## 2022-12-01 NOTE — Assessment & Plan Note (Signed)
Blood pressure is at goal yesterday at cardiology office she did not take blood pressure medications yet this morning we will not make any changes.

## 2022-12-04 ENCOUNTER — Telehealth: Payer: Self-pay | Admitting: Cardiovascular Disease

## 2022-12-04 NOTE — Telephone Encounter (Signed)
Returned call to patient who wanted to verify that Sertraline would be okay for her to take prior to her Coronary CTA. Informed her yes, its fine and reminded her to hold her BP med that contains HCTZ and take metoprolol two hours prior. She verbalized understanding. No further questions.

## 2022-12-04 NOTE — Telephone Encounter (Signed)
Pt would like a callback regarding upcoming CT. She would like to know if she is ale to taker her anxiety medication, Sertraline before test as well as other questions that she has. Please advise

## 2022-12-05 ENCOUNTER — Telehealth (HOSPITAL_COMMUNITY): Payer: Self-pay | Admitting: Emergency Medicine

## 2022-12-05 NOTE — Telephone Encounter (Signed)
Reaching out to patient to offer assistance regarding upcoming cardiac imaging study; pt verbalizes understanding of appt date/time, parking situation and where to check in, pre-test NPO status and medications ordered, and verified current allergies; name and call back number provided for further questions should they arise Marchia Bond RN Navigator Cardiac Imaging [facility] [phone] office [phone] cell  Arrival [address] WC entrance '100mg'$  metoprolol Holding BP med olmesartan-amlodipine-HCTZ Denies iv issues Aware contrast/nitro

## 2022-12-06 ENCOUNTER — Ambulatory Visit (HOSPITAL_COMMUNITY)
Admission: RE | Admit: 2022-12-06 | Discharge: 2022-12-06 | Disposition: A | Discharge status: Home / Self Care | Payer: 59 | Source: Ambulatory Visit | Attending: Cardiovascular Disease | Admitting: Cardiovascular Disease

## 2022-12-06 DIAGNOSIS — I2510 Atherosclerotic heart disease of native coronary artery without angina pectoris: Secondary | ICD-10-CM | POA: Diagnosis not present

## 2022-12-06 DIAGNOSIS — E782 Mixed hyperlipidemia: Secondary | ICD-10-CM | POA: Insufficient documentation

## 2022-12-06 DIAGNOSIS — R079 Chest pain, unspecified: Secondary | ICD-10-CM | POA: Diagnosis not present

## 2022-12-06 DIAGNOSIS — I10 Essential (primary) hypertension: Secondary | ICD-10-CM | POA: Insufficient documentation

## 2022-12-06 MED ORDER — NITROGLYCERIN 0.4 MG SL SUBL
SUBLINGUAL_TABLET | SUBLINGUAL | Status: AC
  Filled 2022-12-06: qty 2

## 2022-12-06 MED ORDER — IOHEXOL 350 MG/ML SOLN
100.0000 mL | Freq: Once | INTRAVENOUS | Status: AC | PRN
  Administered 2022-12-06: 100 mL via INTRAVENOUS

## 2022-12-06 MED ORDER — NITROGLYCERIN 0.4 MG SL SUBL
0.8000 mg | SUBLINGUAL_TABLET | Freq: Once | SUBLINGUAL | Status: AC
  Administered 2022-12-06: 0.8 mg via SUBLINGUAL

## 2022-12-11 NOTE — Therapy (Signed)
OUTPATIENT PHYSICAL THERAPY VESTIBULAR EVALUATION     Patient Name: Eileen Cameron MRN: [id number] DOB:12-22-1950, 72 y.o., female Today's Date: 12/12/2022  END OF SESSION:  PT End of Session - 12/12/22 1228     Visit Number 1    Date for PT Re-Evaluation 02/20/23    Authorization Type [employer]    PT Start Time 1230    PT Stop Time 1315    PT Time Calculation (min) 45 min    Activity Tolerance Patient tolerated treatment well    Behavior During Therapy [facility] for tasks assessed/performed             Past Medical History:  Diagnosis Date   Adrenal incidentaloma ([place]) 07/08/2015   1.0 by 1.4 cm lateral limb left adrenal mass on CT abdomen on 07/08/15 Followed up in April 2018, stable no further imaging required.   Allergy    seasonal allergies, ace inhibitors--angioedema   Anxiety    Hyperlipidemia    Hypertension    Leg cramps 11/18/2015   Mold exposure 01/29/2020   Neck pain, musculoskeletal 12/27/2017   Post-nasal drip 10/10/2016   Right shoulder pain 04/20/2016   Past Surgical History:  Procedure Laterality Date   COLONOSCOPY N/A 01/08/2013   Procedure: COLONOSCOPY;  Surgeon: Arta Silence, MD;  Location: [facility];  Service: Endoscopy;  Laterality: N/A;   ECTOPIC PREGNANCY SURGERY  1970s   IR RADIOLOGY PERIPHERAL GUIDED IV START  07/02/2018   IR US GUIDE VASC ACCESS RIGHT  07/02/2018   right breast abscess drainage     TONSILLECTOMY     TUBAL LIGATION     Patient Active Problem List   Diagnosis Date Noted   Gastritis, Helicobacter pylori 93/90/3009   Auditory hallucination 11/02/2022   Mild cognitive impairment 09/15/2022   Piriformis syndrome, left 05/11/2022   Muscle spasm of back 02/09/2022   Moderate obstructive sleep apnea 02/09/2022   Iron deficiency 02/09/2022   Venous insufficiency 12/11/2021   Dyspepsia 10/08/2021   Diuretic-induced hypokalemia 10/08/2021   Headache in back of head 09/21/2021   Generalized anxiety disorder 07/14/2021    Gastroenteritis 01/24/2021   MDD (major depressive disorder), recurrent episode, moderate ([place]) 08/25/2019   Bilateral temporomandibular joint pain 05/01/2019   Palpitations 02/18/2018   Visual floaters, bilateral 02/18/2018   Primary osteoarthritis of right knee 02/14/2018   Rhinitis, chronic 01/22/2018   Vertigo 12/27/2017   Obesity (BMI 30.0-34.9) 08/09/2017   Vitamin D insufficiency 08/07/2017   Myalgia due to statin 02/08/2017   Risk for coronary artery disease between 10% and 20% in next 10 years 08/11/2016   History of statin therapy 08/10/2016   Encounter for immunization 07/18/2016   GERD (gastroesophageal reflux disease) 11/28/2014   Insomnia 09/18/2014   Seasonal allergies 12/31/2013   Fatigue due to sleep pattern disturbance 05/13/2013   Hot flashes 04/08/2013   Preventative health care 08/26/2012   Essential hypertension 04/01/2009   Lumbar back pain with radiculopathy affecting right lower extremity 04/01/2009    PCP: Joni Reining REFERRING PROVIDER: Joni Reining  REFERRING DIAG: R42  THERAPY DIAG:  No diagnosis found.  ONSET DATE: 11/30/22  Rationale for Evaluation and Treatment: Rehabilitation  SUBJECTIVE:   SUBJECTIVE STATEMENT: I am doing so-so, it was real bad one time but the doctor gave me some pills. When I turn my head to the right the room starts to spin. I tried the maneuvers from YouTube but they did not work.  Pt accompanied by: family member  PERTINENT HISTORY: HTN, anxiety  PAIN:  Are you having pain? No  PRECAUTIONS: None  WEIGHT BEARING RESTRICTIONS: No  FALLS: Has patient fallen in last 6 months? No  LIVING ENVIRONMENT: Lives with: lives alone Lives in: House/apartment Stairs: No Has following equipment at home: None  PLOF: Independent  PATIENT GOALS: work on tightness in my neck and get rid of dizziness  OBJECTIVE:   COGNITION: Overall cognitive status: Within functional limits for tasks  assessed   SENSATION: WFL  POSTURE:  rounded shoulders and forward head  Cervical ROM:    Active A/PROM (deg) eval  Flexion WFL a little dizzy  Extension WFL  Right lateral flexion 50%   Left lateral flexion 50%   Right rotation 50%  Left rotation 75%  (Blank rows = not tested)  UE and LE STRENGTH: 4 to 4+/5 overall  FUNCTIONAL TESTS:  5 times sit to stand: 15.01s  PATIENT SURVEYS:  FOTO 65  VESTIBULAR ASSESSMENT:  SYMPTOM BEHAVIOR:  Non-Vestibular symptoms: headaches and tinnitus  Type of dizziness: Spinning/Vertigo  Frequency: everyday  Duration: a few seconds  Aggravating factors: Induced by position change: rolling to the right  Relieving factors: lying supine, rest, and slow movements  Progression of symptoms: better  OCULOMOTOR EXAM:  Ocular Alignment: normal  Ocular ROM: No Limitations  Spontaneous Nystagmus: absent  Gaze-Induced Nystagmus: absent  Smooth Pursuits: intact  Saccades: intact  Convergence/Divergence: normal  VESTIBULAR - OCULAR REFLEX:   Slow VOR: Normal  VOR Cancellation: Normal  POSITIONAL TESTING: Right Dix-Hallpike: no nystagmus and Duration: dizziness lasting 10-15s    VESTIBULAR TREATMENT:                                                                                                   DATE: 12/12/22- EVAL  Canalith Repositioning:  Epley Right: Number of Reps: 2 and Response to Treatment: symptoms improved   PATIENT EDUCATION: Education details: HEP and POC Person educated: Patient Education method: Explanation Education comprehension: verbalized understanding  HOME EXERCISE PROGRAM:  GOALS: GOALS: Goals reviewed with patient? Yes  SHORT TERM GOALS: Target date: 01/16/23  Patient will be independent with initial HEP.  Goal status: INITIAL    LONG TERM GOALS: Target date: 02/20/23  Patient will be independent with advanced/ongoing HEP to improve outcomes and carryover.   Goal status: INITIAL  2.  Patient  will report 75% decrease in dizziness.  Goal status: INITIAL  3.  Patient will be able to turn over in bed without having dizziness  Goal status: INITIAL  4.  Patient will present with decreased tightness and increased neck ROM to WNL Goal status: INITIAL    ASSESSMENT:  CLINICAL IMPRESSION: Patient is a 72 y.o. female who was seen today for physical therapy evaluation and treatment for BPPV. She presents with dizziness with R Dix-hallpike-- completed Epley maneuver and she was dizzy when looking right and when looking down at floor. She also got dizzy when sitting back up so we waited and did it again. The second time she had no complaints of dizziness. Patient presents with increased tightness in bilateral upper traps and neck area. She will benefit from  skilled PT to address her dizziness and neck stiffness to be able to turn over in bed and do quick head movements without being dizzy.   OBJECTIVE IMPAIRMENTS: decreased balance and dizziness.   ACTIVITY LIMITATIONS: bending and sleeping  REHAB POTENTIAL: Good  CLINICAL DECISION MAKING: Stable/uncomplicated  EVALUATION COMPLEXITY: Low   PLAN:  PT FREQUENCY: 1-2x/week  PT DURATION: 10 weeks  PLANNED INTERVENTIONS: Therapeutic exercises, Therapeutic activity, Neuromuscular re-education, Balance training, Gait training, Patient/Family education, Self Care, Joint mobilization, Vestibular training, Canalith repositioning, Dry Needling, Cryotherapy, Moist heat, Traction, and Manual therapy  PLAN FOR NEXT SESSION: see how repositioning maneuver went, vestibular training, STM to cervical mm   Andris Baumann, PT 12/12/2022, 1:24 PM

## 2022-12-12 ENCOUNTER — Ambulatory Visit: Payer: 59 | Attending: Internal Medicine

## 2022-12-12 ENCOUNTER — Encounter: Payer: Self-pay | Admitting: Internal Medicine

## 2022-12-12 ENCOUNTER — Telehealth: Payer: Self-pay | Admitting: Internal Medicine

## 2022-12-12 DIAGNOSIS — H8111 Benign paroxysmal vertigo, right ear: Secondary | ICD-10-CM | POA: Diagnosis not present

## 2022-12-12 DIAGNOSIS — R42 Dizziness and giddiness: Secondary | ICD-10-CM | POA: Insufficient documentation

## 2022-12-12 DIAGNOSIS — K2970 Gastritis, unspecified, without bleeding: Secondary | ICD-10-CM | POA: Insufficient documentation

## 2022-12-12 DIAGNOSIS — M542 Cervicalgia: Secondary | ICD-10-CM | POA: Insufficient documentation

## 2022-12-12 MED ORDER — OMEPRAZOLE 20 MG PO CPDR: 20.0000 mg | DELAYED_RELEASE_CAPSULE | Freq: Two times a day (BID) | ORAL | 0 refills | Status: DC

## 2022-12-12 MED ORDER — DOXYCYCLINE HYCLATE 100 MG PO TABS: 100.0000 mg | ORAL_TABLET | Freq: Two times a day (BID) | ORAL | 0 refills | Status: DC

## 2022-12-12 MED ORDER — METRONIDAZOLE 250 MG PO TABS: 250.0000 mg | ORAL_TABLET | Freq: Four times a day (QID) | ORAL | 0 refills | Status: AC

## 2022-12-12 NOTE — Telephone Encounter (Signed)
Received records from [facility], she was positive for H Pylori from EGD.  Spoke with dr Lavetta Nielsen nurse was sent quad therapy with metronidazole, doxycyline, omeprazole, and pepto bismal.  I called [place], she has omeprazole but takes it only once a day, has the bottle of metronidazole but with 8 day supply left from when she stopped taking it (panic attack), she cannot find any doycycline and not sure if she took that.  I went over the plan for 14 day Quad therapy with her and also called and went over it with daughter April, (she will type it up).  Kitt also noted no side effects yet with zoloft, mild improvement in anxiety.  Will see her back in early march.

## 2022-12-14 ENCOUNTER — Telehealth: Payer: Self-pay | Admitting: Internal Medicine

## 2022-12-14 NOTE — Telephone Encounter (Signed)
Return pt's call. Pt stated Doxycycline or Metronidazole is making her stomach hurt. She's unsure which one. Stated she takes them after she has eaten breakfast.

## 2022-12-14 NOTE — Telephone Encounter (Signed)
Pt requesting a call back.  Pt states one of her medications is making her stomach upset but she doesnt know which medicine it is.

## 2022-12-15 NOTE — Telephone Encounter (Signed)
Patient calling again regarding medication causing vomiting. She thinks it's the doxycycline as she vomits w/i 1 hour every time she takes it. She was instructed to not take anymore doxy till she hears back from [place].  Also, patient thinks she needs to increase sertraline from 25 mg to 50 mg daily. States she discussed this with PCP during phone call earlier this week.

## 2022-12-16 ENCOUNTER — Encounter: Payer: Self-pay | Admitting: *Deleted

## 2022-12-18 MED ORDER — SERTRALINE HCL 50 MG PO TABS: 50.0000 mg | ORAL_TABLET | Freq: Every day | ORAL | 2 refills | Status: DC

## 2022-12-18 MED ORDER — TETRACYCLINE HCL 500 MG PO CAPS: 500.0000 mg | ORAL_CAPSULE | Freq: Four times a day (QID) | ORAL | 0 refills | Status: AC

## 2022-12-18 NOTE — Telephone Encounter (Signed)
Called [place] [place], will change Doxycycline to tetracycline and have her take 52m 4 times a day.  Told her it was fine to go ahead and increase sertraline to 51mdaily.

## 2022-12-20 ENCOUNTER — Ambulatory Visit: Payer: 59 | Admitting: Physical Therapy

## 2022-12-20 ENCOUNTER — Encounter: Payer: Self-pay | Admitting: Physical Therapy

## 2022-12-20 DIAGNOSIS — R42 Dizziness and giddiness: Secondary | ICD-10-CM

## 2022-12-20 DIAGNOSIS — H8111 Benign paroxysmal vertigo, right ear: Secondary | ICD-10-CM | POA: Diagnosis not present

## 2022-12-20 DIAGNOSIS — M542 Cervicalgia: Secondary | ICD-10-CM

## 2022-12-20 NOTE — Therapy (Signed)
OUTPATIENT PHYSICAL THERAPY VESTIBULAR EVALUATION     Patient Name: Eileen Cameron MRN: [id number] DOB:1951-05-07, 72 y.o., female Today's Date: 12/20/2022  END OF SESSION:  PT End of Session - 12/20/22 0928     Visit Number 2    Date for PT Re-Evaluation 02/20/23    PT Start Time 0927    PT Stop Time 1010    PT Time Calculation (min) 43 min    Activity Tolerance Patient tolerated treatment well    Behavior During Therapy [facility] for tasks assessed/performed              Past Medical History:  Diagnosis Date   Adrenal incidentaloma ([place]) 07/08/2015   1.0 by 1.4 cm lateral limb left adrenal mass on CT abdomen on 07/08/15 Followed up in April 2018, stable no further imaging required.   Allergy    seasonal allergies, ace inhibitors--angioedema   Anxiety    Hyperlipidemia    Hypertension    Leg cramps 11/18/2015   Mold exposure 01/29/2020   Neck pain, musculoskeletal 12/27/2017   Post-nasal drip 10/10/2016   Right shoulder pain 04/20/2016   Past Surgical History:  Procedure Laterality Date   COLONOSCOPY N/A 01/08/2013   Procedure: COLONOSCOPY;  Surgeon: Arta Silence, MD;  Location: [facility];  Service: Endoscopy;  Laterality: N/A;   ECTOPIC PREGNANCY SURGERY  1970s   IR RADIOLOGY PERIPHERAL GUIDED IV START  07/02/2018   IR US GUIDE VASC ACCESS RIGHT  07/02/2018   right breast abscess drainage     TONSILLECTOMY     TUBAL LIGATION     Patient Active Problem List   Diagnosis Date Noted   Gastritis, Helicobacter pylori 0000000   Auditory hallucination 11/02/2022   Mild cognitive impairment 09/15/2022   Piriformis syndrome, left 05/11/2022   Muscle spasm of back 02/09/2022   Moderate obstructive sleep apnea 02/09/2022   Iron deficiency 02/09/2022   Venous insufficiency 12/11/2021   Dyspepsia 10/08/2021   Diuretic-induced hypokalemia 10/08/2021   Headache in back of head 09/21/2021   Generalized anxiety disorder 07/14/2021   Gastroenteritis 01/24/2021   MDD  (major depressive disorder), recurrent episode, moderate ([place]) 08/25/2019   Bilateral temporomandibular joint pain 05/01/2019   Palpitations 02/18/2018   Visual floaters, bilateral 02/18/2018   Primary osteoarthritis of right knee 02/14/2018   Rhinitis, chronic 01/22/2018   Vertigo 12/27/2017   Obesity (BMI 30.0-34.9) 08/09/2017   Vitamin D insufficiency 08/07/2017   Myalgia due to statin 02/08/2017   Risk for coronary artery disease between 10% and 20% in next 10 years 08/11/2016   History of statin therapy 08/10/2016   Encounter for immunization 07/18/2016   GERD (gastroesophageal reflux disease) 11/28/2014   Insomnia 09/18/2014   Seasonal allergies 12/31/2013   Fatigue due to sleep pattern disturbance 05/13/2013   Hot flashes 04/08/2013   Preventative health care 08/26/2012   Essential hypertension 04/01/2009   Lumbar back pain with radiculopathy affecting right lower extremity 04/01/2009    PCP: Joni Reining REFERRING PROVIDER: Joni Reining  REFERRING DIAG: R42  THERAPY DIAG:  BPPV (benign paroxysmal positional vertigo), right  Dizziness and giddiness  Cervicalgia  ONSET DATE: 11/30/22  Rationale for Evaluation and Treatment: Rehabilitation  SUBJECTIVE:   SUBJECTIVE STATEMENT: I am doing so-so. She reports that her dizziness increased after her last treatment. She is still having the dizziness when she turns her head. She also reports some dizziness when moving sup to sit or sit to stand. Her neck remains very tight as well.  Pt accompanied by: family  member  PERTINENT HISTORY: HTN, anxiety  PAIN:  Are you having pain? No  PRECAUTIONS: None  WEIGHT BEARING RESTRICTIONS: No  FALLS: Has patient fallen in last 6 months? No  LIVING ENVIRONMENT: Lives with: lives alone Lives in: House/apartment Stairs: No Has following equipment at home: None  PLOF: Independent  PATIENT GOALS: work on tightness in my neck and get rid of dizziness  OBJECTIVE:    COGNITION: Overall cognitive status: Within functional limits for tasks assessed   SENSATION: WFL  POSTURE:  rounded shoulders and forward head  Cervical ROM:    Active A/PROM (deg) eval  Flexion WFL a little dizzy  Extension WFL  Right lateral flexion 50%   Left lateral flexion 50%   Right rotation 50%  Left rotation 75%  (Blank rows = not tested)  UE and LE STRENGTH: 4 to 4+/5 overall  FUNCTIONAL TESTS:  5 times sit to stand: 15.01s  PATIENT SURVEYS:  FOTO 65  VESTIBULAR ASSESSMENT:  SYMPTOM BEHAVIOR:  Non-Vestibular symptoms: headaches and tinnitus  Type of dizziness: Spinning/Vertigo  Frequency: everyday  Duration: a few seconds  Aggravating factors: Induced by position change: rolling to the right  Relieving factors: lying supine, rest, and slow movements  Progression of symptoms: better  OCULOMOTOR EXAM:  Ocular Alignment: normal  Ocular ROM: No Limitations  Spontaneous Nystagmus: absent  Gaze-Induced Nystagmus: absent  Smooth Pursuits: intact  Saccades: intact  Convergence/Divergence: normal  VESTIBULAR - OCULAR REFLEX:   Slow VOR: Normal  VOR Cancellation: Normal  POSITIONAL TESTING: Right Dix-Hallpike: no nystagmus and Duration: dizziness lasting 10-15s    VESTIBULAR TREATMENT:                                                                                                   DATE:  12/20/22 STM to cervical paraspinals, B LS and up traps, B scalenes and SCM, B pects F/B stretch to all muscle groups. Seated press ups for scapular stability x 5 R Epley- nystagmus and dizziness in first position, but none after that. She reports improvement afterwards.   12/12/22- EVAL  Canalith Repositioning:  Epley Right: Number of Reps: 2 and Response to Treatment: symptoms improved   PATIENT EDUCATION: Education details: HEP and POC Person educated: Patient Education method: Explanation Education comprehension: verbalized understanding  HOME  EXERCISE PROGRAM:  GOALS: GOALS: Goals reviewed with patient? Yes  SHORT TERM GOALS: Target date: 01/16/23  Patient will be independent with initial HEP.  Goal status: 12/20/22 ongoing    LONG TERM GOALS: Target date: 02/20/23  Patient will be independent with advanced/ongoing HEP to improve outcomes and carryover.   Goal status: INITIAL  2.  Patient will report 75% decrease in dizziness.  Goal status: INITIAL  3.  Patient will be able to turn over in bed without having dizziness  Goal status: INITIAL  4.  Patient will present with decreased tightness and increased neck ROM to WNL Goal status: INITIAL    ASSESSMENT:  CLINICAL IMPRESSION: Patient reports symptoms increased briefly after last treatment. Addressed neck tightness and pain with STM, stretch and strengthening, then repeated Epley, with  dizziness and nystagmus in forst position, but improved after that. HEP updated with stretch and strengthening.  OBJECTIVE IMPAIRMENTS: decreased balance and dizziness.   ACTIVITY LIMITATIONS: bending and sleeping  REHAB POTENTIAL: Good  CLINICAL DECISION MAKING: Stable/uncomplicated  EVALUATION COMPLEXITY: Low   PLAN:  PT FREQUENCY: 1-2x/week  PT DURATION: 10 weeks  PLANNED INTERVENTIONS: Therapeutic exercises, Therapeutic activity, Neuromuscular re-education, Balance training, Gait training, Patient/Family education, Self Care, Joint mobilization, Vestibular training, Canalith repositioning, Dry Needling, Cryotherapy, Moist heat, Traction, and Manual therapy  PLAN FOR NEXT SESSION: see how repositioning maneuver went, strengthening for posture   Marcelina Morel, DPT 12/20/2022, 10:17 AM

## 2022-12-24 ENCOUNTER — Telehealth: Payer: Self-pay | Admitting: Student

## 2022-12-24 NOTE — Telephone Encounter (Signed)
2:30 AM, paged to call patient back.  She reports symptoms of burning sensation of her arms and legs that started yesterday.  She associated this symptom to side effects of metronidazole, that she read on the bottle.  She has been taking quadruple therapy for H. pylori and has 3 days left of therapy.  Said that she will not take metronidazole anymore.  Peripheral neuropathy is one of the reported side effects of metronidazole, which is usually reversible within days or weeks of discontinuation of therapy.  I do not think we should switch her to a triple therapy now which can cause confusion.  Advised patient to continue the rest of the treatment except for the metronidazole to finish 14 days.  Will let her PCP Dr. Heber Weatherly know.

## 2022-12-26 ENCOUNTER — Ambulatory Visit (INDEPENDENT_AMBULATORY_CARE_PROVIDER_SITE_OTHER): Payer: 59 | Admitting: Mental Health

## 2022-12-26 DIAGNOSIS — F331 Major depressive disorder, recurrent, moderate: Secondary | ICD-10-CM | POA: Diagnosis not present

## 2022-12-26 DIAGNOSIS — F411 Generalized anxiety disorder: Secondary | ICD-10-CM

## 2022-12-26 NOTE — Telephone Encounter (Signed)
Called her back.  I agree with Dr Alfonse Spruce and she should remain off metronidazole.  I think this will get better with time.  Benadryl has historically worsened her anxiety, I discussed that it may be reasonable to try OTC Zyrtec to see if it helps the burning sensation in her extremitites.  Additionally she had her first visit with [facility] and given some mindfulness and breathing exercises which she will try.  She remains on 54m of zoloft but has not seen significant improvement yet.  Advised her to continue this for know.  We have an appointment scheduled for next month.  Advised that she may call back if needed, advised during business hours if not an emergency and it may take me a little bit of time to get back to her.

## 2022-12-26 NOTE — Telephone Encounter (Signed)
Patient calling in to speak with PCP. States last dose of metronidazole was 2/18, but arms and legs are still "burning."  Patient already has first available appt with PCP on 01/11/23.

## 2022-12-26 NOTE — Progress Notes (Signed)
   THERAPIST PROGRESS NOTE  Session Time: 10:01 am ( 43 minutes)  Participation Level: Active  Behavioral Response: CasualAlertAnxious  Type of Therapy: Individual Therapy  Treatment Goals addressed: STG: Allyssa will increase management of anxiety AEB development of x 3 effective grounding and relaxation skills with no more than x 1 anxiety attack weekly within the next 90 days   ProgressTowards Goals: Initial  Interventions: CBT and Supportive  Summary: Eileen Cameron is a 72 y.o. female who presents with dx of major depression moderate and generalized anxiety. Presents today with chief complaint of anxiety. Presents alert and oriented; mood and affect adequate. Speech clear and coherent. Thought process logical. Dressed appropriately for weather. Pleasant demeanor. Engaged and receptive to interventions. States sxs of anxiety have been unchanged despite medication compliance. Notes to have not felt well with stomach issues and believes medication for that has caused concern with burning in her legs. Shares anxiety causes headaches at times. Shares anxiety at times related to feeling as if something awful is going to happen to her. Able to engage with therapist with education on unrealistic vs. Realistic anxiety and notes to largely hold unrealistic anxiety. Notes some coping abiity through use of reading the bible and prayer. Reports very little interactions with others during the day has had presented for yoga and chair exercise in her community. Engaged and receptive to interventions. Able to reframe anxious thought with support. Able to  practice challenging thoughts and practicing deep breathing exercises. Agrees to practice. Denies current safety concerns.    Suicidal/Homicidal: Nowithout intent/plan  Therapist Response: Therapist engaged Geophysical data processor in therapy session. Reviewed assessment information; reviewed informed consent and bounds of confidentiality. Engaged in rapport building and  exploration of current level of stressors, sxs management and ability t manage sxs. Engaged in education on anxiety disorders and connection between thoughts, feelings and behaviors with use of visualization of cognitive triad. Engaged in education of ability to challenge anxious thoughts and reviewed coping thoughts to support in reframing anxious thoughts. Lead through deep breathing exercise box breathing and lead through practice in session. Encouraged engagement in community and connection with others. Encouraged practice of box breathing and challenging anxious thoughts. Reviewed session and provided follow up appointment.    Plan: Return again in x 3 weeks.  Diagnosis: MDD (major depressive disorder), recurrent episode, moderate ([facility])  Generalized anxiety disorder  Collaboration of Care: Other None  Patient/Guardian was advised Release of Information must be obtained prior to any record release in order to collaborate their care with an outside provider. Patient/Guardian was advised if they have not already done so to contact the registration department to sign all necessary forms in order for [place] to release information regarding their care.   Consent: Patient/Guardian gives verbal consent for treatment and assignment of benefits for services provided during this visit. Patient/Guardian expressed understanding and agreed to proceed.   Rockey Situ [place], [facility] 12/26/2022

## 2022-12-27 ENCOUNTER — Other Ambulatory Visit: Payer: Self-pay

## 2022-12-27 ENCOUNTER — Ambulatory Visit: Payer: 59

## 2022-12-27 DIAGNOSIS — H8111 Benign paroxysmal vertigo, right ear: Secondary | ICD-10-CM

## 2022-12-27 DIAGNOSIS — R42 Dizziness and giddiness: Secondary | ICD-10-CM

## 2022-12-27 DIAGNOSIS — M542 Cervicalgia: Secondary | ICD-10-CM

## 2022-12-27 NOTE — Therapy (Signed)
OUTPATIENT PHYSICAL THERAPY VESTIBULAR EVALUATION     Patient Name: Eileen Cameron MRN: [id number] DOB:Mar 07, 1951, 72 y.o., female Today's Date: 12/27/2022  END OF SESSION:  PT End of Session - 12/27/22 1429     Visit Number 3    Date for PT Re-Evaluation 02/20/23    Authorization Type [employer]    PT Start Time [id number]    PT Stop Time 1515    PT Time Calculation (min) 46 min              Past Medical History:  Diagnosis Date   Adrenal incidentaloma ([place]) 07/08/2015   1.0 by 1.4 cm lateral limb left adrenal mass on CT abdomen on 07/08/15 Followed up in April 2018, stable no further imaging required.   Allergy    seasonal allergies, ace inhibitors--angioedema   Anxiety    Hyperlipidemia    Hypertension    Leg cramps 11/18/2015   Mold exposure 01/29/2020   Neck pain, musculoskeletal 12/27/2017   Post-nasal drip 10/10/2016   Right shoulder pain 04/20/2016   Past Surgical History:  Procedure Laterality Date   COLONOSCOPY N/A 01/08/2013   Procedure: COLONOSCOPY;  Surgeon: Eileen Silence, MD;  Location: [facility];  Service: Endoscopy;  Laterality: N/A;   ECTOPIC PREGNANCY SURGERY  1970s   IR RADIOLOGY PERIPHERAL GUIDED IV START  07/02/2018   IR US GUIDE VASC ACCESS RIGHT  07/02/2018   right breast abscess drainage     TONSILLECTOMY     TUBAL LIGATION     Patient Active Problem List   Diagnosis Date Noted   Gastritis, Helicobacter pylori 0000000   Auditory hallucination 11/02/2022   Mild cognitive impairment 09/15/2022   Piriformis syndrome, left 05/11/2022   Muscle spasm of back 02/09/2022   Moderate obstructive sleep apnea 02/09/2022   Iron deficiency 02/09/2022   Venous insufficiency 12/11/2021   Dyspepsia 10/08/2021   Diuretic-induced hypokalemia 10/08/2021   Headache in back of head 09/21/2021   Generalized anxiety disorder 07/14/2021   Gastroenteritis 01/24/2021   MDD (major depressive disorder), recurrent episode, moderate ([place]) 08/25/2019   Bilateral  temporomandibular joint pain 05/01/2019   Palpitations 02/18/2018   Visual floaters, bilateral 02/18/2018   Primary osteoarthritis of right knee 02/14/2018   Rhinitis, chronic 01/22/2018   Vertigo 12/27/2017   Obesity (BMI 30.0-34.9) 08/09/2017   Vitamin D insufficiency 08/07/2017   Myalgia due to statin 02/08/2017   Risk for coronary artery disease between 10% and 20% in next 10 years 08/11/2016   History of statin therapy 08/10/2016   Encounter for immunization 07/18/2016   GERD (gastroesophageal reflux disease) 11/28/2014   Insomnia 09/18/2014   Seasonal allergies 12/31/2013   Fatigue due to sleep pattern disturbance 05/13/2013   Hot flashes 04/08/2013   Preventative health care 08/26/2012   Essential hypertension 04/01/2009   Lumbar back pain with radiculopathy affecting right lower extremity 04/01/2009    PCP: Eileen Cameron REFERRING PROVIDER: Joni Cameron  REFERRING DIAG: R42  THERAPY DIAG:  BPPV (benign paroxysmal positional vertigo), right  Dizziness and giddiness  Cervicalgia  ONSET DATE: 11/30/22  Rationale for Evaluation and Treatment: Rehabilitation  SUBJECTIVE:   SUBJECTIVE STATEMENT: Much improved, no occasions of vertigo since last week.  Neck still stiff. Reports "anxiety in her chest" states has been evaluated by multiple MD's to rule out heart problems just anxiety.Pt accompanied by: family member  PERTINENT HISTORY: HTN, anxiety  PAIN:  Are you having pain? No  PRECAUTIONS: None  WEIGHT BEARING RESTRICTIONS: No  FALLS: Has patient fallen in last  6 months? No  LIVING ENVIRONMENT: Lives with: lives alone Lives in: House/apartment Stairs: No Has following equipment at home: None  PLOF: Independent  PATIENT GOALS: work on tightness in my neck and get rid of dizziness  OBJECTIVE:   COGNITION: Overall cognitive status: Within functional limits for tasks assessed   SENSATION: WFL  POSTURE:  rounded shoulders and forward  head  Cervical ROM:    Active A/PROM (deg) eval  Flexion WFL a little dizzy  Extension WFL  Right lateral flexion 50%   Left lateral flexion 50%   Right rotation 50%  Left rotation 75%  (Blank rows = not tested)  UE and LE STRENGTH: 4 to 4+/5 overall  FUNCTIONAL TESTS:  5 times sit to stand: 15.01s  PATIENT SURVEYS:  FOTO 65  VESTIBULAR ASSESSMENT:  SYMPTOM BEHAVIOR:  Non-Vestibular symptoms: headaches and tinnitus  Type of dizziness: Spinning/Vertigo  Frequency: everyday  Duration: a few seconds  Aggravating factors: Induced by position change: rolling to the right  Relieving factors: lying supine, rest, and slow movements  Progression of symptoms: better  OCULOMOTOR EXAM:  Ocular Alignment: normal  Ocular ROM: No Limitations  Spontaneous Nystagmus: absent  Gaze-Induced Nystagmus: absent  Smooth Pursuits: intact  Saccades: intact  Convergence/Divergence: normal  VESTIBULAR - OCULAR REFLEX:   Slow VOR: Normal  VOR Cancellation: Normal  POSITIONAL TESTING: Right Dix-Hallpike: no nystagmus and Duration: dizziness lasting 10-15s    VESTIBULAR TREATMENT:                                                                                                   DATE:  12/27/22 Manual:  cervical Rom rotation R 75%, L 85%, reports L upper traps pulling  Supine for alternating gentle cervical spine traction, manual stretching of upper traps and levator scapulae, also contract relax for rotation of upper cervical spine musculature. 3 sets each 6 bouts  Prone for cross friction mass B upper traps, levator, gentle stretch PA upper post ribs and upper t spine. Assessed balance per assessment below with components of FGA  Therex:  instructed in theraband shoulder shrugs, also B shoulder ER with scapular retraction- yellow band   12/20/22 STM to cervical paraspinals, B LS and up traps, B scalenes and SCM, B pects F/B stretch to all muscle groups. Seated press ups for scapular  stability x 5 R Epley- nystagmus and dizziness in first position, but none after that. She reports improvement afterwards.   12/12/22- EVAL  Canalith Repositioning:  Epley Right: Number of Reps: 2 and Response to Treatment: symptoms improved   PATIENT EDUCATION: Education details: HEP and POC Person educated: Patient Education method: Explanation Education comprehension: verbalized understanding  HOME EXERCISE PROGRAM: Access Code: [email or web address] URL: [email or web address] Date: 12/27/2022 Prepared by: Jhostin Epps  Exercises - Seated Shoulder Shrugs  - 1 x daily - 3 x weekly - 3 sets - 10 reps - Shoulder W - External Rotation with Resistance  - 1 x daily - 3 x weekly - 3 sets - 10 reps GOALS: GOALS: Goals reviewed with patient? Yes  SHORT TERM GOALS: Target date: 01/16/23  Patient will be independent with initial HEP.  Goal status: 12/20/22 ongoing    LONG TERM GOALS: Target date: 02/20/23  Patient will be independent with advanced/ongoing HEP to improve outcomes and carryover.   Goal status: INITIAL  2.  Patient will report 75% decrease in dizziness.  Goal status: MET  3.  Patient will be able to turn over in bed without having dizziness  Goal status: MET  4.  Patient will present with decreased tightness and increased neck ROM to WNL Goal status: IN PROGRESS    ASSESSMENT:  CLINICAL IMPRESSION: Patient reports symptoms of vertigo much improved after last session. No episodes of dizziness at all.  Did reassess for R dix hallpike again today, and all clear no reproduction of Sx.  Also assessed components of FGA, walking backwards, with eyes closed , with fast turns, and unilateral balancing.  All of which she was able to perform without problems or vertigo Sx.  Did progress with additional postural training ex for cervical spine.  She is scheduled for next week and will continue to monitor her dizziness episodes. OBJECTIVE IMPAIRMENTS: decreased balance and  dizziness.   ACTIVITY LIMITATIONS: bending and sleeping  REHAB POTENTIAL: Good  CLINICAL DECISION MAKING: Stable/uncomplicated  EVALUATION COMPLEXITY: Low   PLAN:  PT FREQUENCY: 1-2x/week  PT DURATION: 10 weeks  PLANNED INTERVENTIONS: Therapeutic exercises, Therapeutic activity, Neuromuscular re-education, Balance training, Gait training, Patient/Family education, Self Care, Joint mobilization, Vestibular training, Canalith repositioning, Dry Needling, Cryotherapy, Moist heat, Traction, and Manual therapy  PLAN FOR NEXT SESSION: see how repositioning maneuver went, strengthening for posture   Calem Cocozza, PT, DPT 12/27/2022, 4:13 PM

## 2022-12-28 ENCOUNTER — Telehealth: Payer: Self-pay

## 2022-12-28 NOTE — Telephone Encounter (Addendum)
Return pt's call - she stated she's unsure which medication to take for the burning in her legs. She stated  she was told to take the one in the purple bottle; but Allegra is in the purple bottle. Is this correct?

## 2022-12-28 NOTE — Telephone Encounter (Signed)
Pt is requesting a call back .Marland Kitchen She stated that her PCP told her to take OTC  med for her  burning in her arm  but it is not working

## 2023-01-01 NOTE — Telephone Encounter (Signed)
Great, thanks

## 2023-01-01 NOTE — Telephone Encounter (Signed)
Called about taking Allegra - Dr Heber [place] states it's ok; "similar" to Zyrtec. At first, pt thought I stated Zantac; clarified and spelled for pt "Zyrtec". Pt stated she will try Zyrtec.

## 2023-01-02 ENCOUNTER — Telehealth: Payer: Self-pay

## 2023-01-02 NOTE — Telephone Encounter (Signed)
Return pt's call - stated she does not think Sertraline is helping her. She continues to have panic attacks; stated she had 1 this am. And she read the side effects - it can cause jittery and restless legs in which she has.

## 2023-01-02 NOTE — Telephone Encounter (Signed)
Requesting to speak with a nurse about sertraline (ZOLOFT) 50 MG tablet, states this medication is not helping her. Please call pt back.

## 2023-01-03 ENCOUNTER — Ambulatory Visit: Payer: 59

## 2023-01-03 MED ORDER — ESCITALOPRAM OXALATE 10 MG PO TABS: 10.0000 mg | ORAL_TABLET | Freq: Every day | ORAL | 2 refills | Status: DC

## 2023-01-03 NOTE — Telephone Encounter (Signed)
Called her, seems like she may be having some trouble sleeping and still with bad anxiety, in this case I will choose a more sedating ssri.  Will have her discontinue sertraline and start Lexapro.  She will see me next week.

## 2023-01-10 ENCOUNTER — Ambulatory Visit: Payer: 59 | Admitting: Physical Therapy

## 2023-01-10 DIAGNOSIS — I700 Atherosclerosis of aorta: Secondary | ICD-10-CM | POA: Insufficient documentation

## 2023-01-11 ENCOUNTER — Ambulatory Visit (INDEPENDENT_AMBULATORY_CARE_PROVIDER_SITE_OTHER): Payer: 59 | Admitting: Internal Medicine

## 2023-01-11 ENCOUNTER — Encounter: Payer: Self-pay | Admitting: Internal Medicine

## 2023-01-11 ENCOUNTER — Other Ambulatory Visit: Payer: Self-pay

## 2023-01-11 VITALS — BP 130/62 | HR 74 | Temp 97.8°F | Ht 63.0 in | Wt 151.1 lb

## 2023-01-11 DIAGNOSIS — F5105 Insomnia due to other mental disorder: Secondary | ICD-10-CM | POA: Diagnosis not present

## 2023-01-11 DIAGNOSIS — J309 Allergic rhinitis, unspecified: Secondary | ICD-10-CM

## 2023-01-11 DIAGNOSIS — F331 Major depressive disorder, recurrent, moderate: Secondary | ICD-10-CM

## 2023-01-11 DIAGNOSIS — F411 Generalized anxiety disorder: Secondary | ICD-10-CM

## 2023-01-11 DIAGNOSIS — E559 Vitamin D deficiency, unspecified: Secondary | ICD-10-CM

## 2023-01-11 DIAGNOSIS — I700 Atherosclerosis of aorta: Secondary | ICD-10-CM

## 2023-01-11 DIAGNOSIS — B9681 Helicobacter pylori [H. pylori] as the cause of diseases classified elsewhere: Secondary | ICD-10-CM

## 2023-01-11 DIAGNOSIS — M542 Cervicalgia: Secondary | ICD-10-CM

## 2023-01-11 DIAGNOSIS — K2970 Gastritis, unspecified, without bleeding: Secondary | ICD-10-CM

## 2023-01-11 DIAGNOSIS — R7302 Impaired glucose tolerance (oral): Secondary | ICD-10-CM

## 2023-01-11 MED ORDER — ESCITALOPRAM OXALATE 20 MG PO TABS: 20.0000 mg | ORAL_TABLET | Freq: Every day | ORAL | 2 refills | Status: DC

## 2023-01-11 MED ORDER — ROSUVASTATIN CALCIUM 5 MG PO TABS: 5.0000 mg | ORAL_TABLET | Freq: Every day | ORAL | 3 refills | Status: DC

## 2023-01-11 MED ORDER — FLUTICASONE PROPIONATE 50 MCG/ACT NA SUSP: 2.0000 | Freq: Every day | NASAL | 3 refills | Status: DC

## 2023-01-11 NOTE — Assessment & Plan Note (Addendum)
    01/11/2023   10:35 AM 11/30/2022   10:16 AM 11/28/2022    2:42 PM 09/14/2022   12:14 PM  GAD 7 : Generalized Anxiety Score  Nervous, Anxious, on Edge '2 2  1  '$ Control/stop worrying '1 2  1  '$ Worry too much - different things '1 1  1  '$ Trouble relaxing 2 0  1  Restless 1 0  0  Easily annoyed or irritable '1 2  1  '$ Afraid - awful might happen '2 2  1  '$ Total GAD 7 Score '10 9  6  '$ Anxiety Difficulty         Information is confidential and restricted. Go to Review Flowsheets to unlock data.    GAd-7 essentially unchanged.  Sleeping a little bit better we will increase Lexapro to 20 mg daily.

## 2023-01-11 NOTE — Assessment & Plan Note (Signed)
Complaining of some postnasal drip symptoms and has swollen inferior turbinates on exam.  Reminded her of prescription of Flonase will represcribe and have her do this.  Okay to continue Zyrtec.

## 2023-01-11 NOTE — Assessment & Plan Note (Addendum)
Aortic atherosclerosis was noted on her last CT in December.  Her ASCVD 10-year risk score is 11.3%.  She had a cardiac CT earlier this year with results: CAD-RADS 2. Mild non-obstructive CAD (25-49%). Consider non-atherosclerotic causes of chest pain. Consider preventive therapy and risk factor modification.  She was previously on Crestor 5 mg this was discontinued last year I am not quite sure why she is willing to restart today.

## 2023-01-11 NOTE — Patient Instructions (Addendum)
I will see about getting you set up with Eileen Cameron our behavioral therapist.  I am increasing your lexapro to '20mg'$  a day.  For your next pain I still want you to try Tylenol first, if that does not work you make take 2 tablets of Aleve (blue bottle with yellow top) you may take this up to twice a day but I do not want you to take it for more than 10 days in any given month.

## 2023-01-11 NOTE — Assessment & Plan Note (Signed)
She has grossly completed a second treatment course at next visit we will try to test for cure.

## 2023-01-11 NOTE — Progress Notes (Signed)
Established Patient Office Visit  Subjective   Patient ID: Eileen Cameron, female    DOB: Aug 24, 1951  Age: 72 y.o. MRN: [id number]  Chief Complaint  Patient presents with   Follow-up    "Nerves";anxiety. Pt c/o neck pain. Also weight loss noted.   Di [name] follows up today for hypertension and anxiety.  At her last visit she had recently had some panic attacks that resulted in an ED visit.  We restarted her on sertraline and I spoke with her a few times since then.  She is still having bad anxiety and felt sertraline was ineffective and may be causing some insomnia.  Given this I told her to discontinue sertraline and start Lexapro as this may have more of a sedating or at least less activating response.  She has had some improvement with sleep but still feeling very anxious.  She wonders about "neuropahty."  Neck pain still not full responding to tylenol.     Objective:     BP 130/62 (BP Location: Right Arm, Patient Position: Sitting, Cuff Size: Normal)   Pulse 74   Temp 97.8 F (36.6 C) (Oral)   Ht '5\' 3"'$  (1.6 m)   Wt 151 lb 1.6 oz (68.5 kg)   SpO2 100% Comment: RA  BMI 26.77 kg/m  BP Readings from Last 3 Encounters:  01/11/23 130/62  12/06/22 115/65  11/30/22 (!) 147/73   Wt Readings from Last 3 Encounters:  01/11/23 151 lb 1.6 oz (68.5 kg)  11/30/22 161 lb 9.6 oz (73.3 kg)  11/29/22 161 lb 3.2 oz (73.1 kg)   SpO2 Readings from Last 3 Encounters:  01/11/23 100%  11/30/22 100%  11/29/22 97%      Physical Exam Vitals and nursing note reviewed.  Constitutional:      Appearance: Normal appearance.  HENT:     Head:     Comments: Swollen inferior turbinates    Right Ear: Tympanic membrane and ear canal normal.     Left Ear: Tympanic membrane and ear canal normal.     Mouth/Throat:     Mouth: Mucous membranes are moist.     Pharynx: Oropharynx is clear. Uvula midline.     Tonsils: No tonsillar exudate.     Comments: Posterior cobblestoneing Musculoskeletal:      Comments: Tender and ropey cervical paraspinals bilaterally  Neurological:     Mental Status: She is alert.      No results found for any visits on 01/11/23.  Last CBC Lab Results  Component Value Date   WBC 5.1 11/02/2022   HGB 13.0 11/02/2022   HCT 39.2 11/02/2022   MCV 91.4 11/02/2022   MCH 30.3 11/02/2022   RDW 13.9 11/02/2022   PLT 261 [id number]   Last metabolic panel Lab Results  Component Value Date   GLUCOSE 80 11/29/2022   NA 143 11/29/2022   K 4.2 11/29/2022   CL 100 11/29/2022   CO2 24 11/29/2022   BUN 10 11/29/2022   CREATININE 0.92 11/29/2022   EGFR 66 11/29/2022   CALCIUM 9.9 11/29/2022   PHOS 3.4 08/10/2019   PROT 7.7 11/02/2022   ALBUMIN 4.3 11/02/2022   LABGLOB 2.8 02/09/2022   AGRATIO 1.7 02/09/2022   BILITOT 0.8 11/02/2022   ALKPHOS 52 11/02/2022   AST 20 11/02/2022   ALT 9 11/02/2022   ANIONGAP 9 11/02/2022   Last thyroid functions Lab Results  Component Value Date   TSH 0.533 09/26/2019      The 10-year ASCVD risk score (Arnett  DK, et al., 2019) is: 13.7%    Assessment & Plan:   Problem List Items Addressed This Visit       Cardiovascular and Mediastinum   Aortic atherosclerosis ([place]) - Primary (Chronic)    Aortic atherosclerosis was noted on her last CT in December.  Her ASCVD 10-year risk score is 11.3%.  She had a cardiac CT earlier this year with results: CAD-RADS 2. Mild non-obstructive CAD (25-49%). Consider non-atherosclerotic causes of chest pain. Consider preventive therapy and risk factor modification.  She was previously on Crestor 5 mg this was discontinued last year I am not quite sure why she is willing to restart today.      Relevant Medications   rosuvastatin (CRESTOR) 5 MG tablet   Other Relevant Orders   Lipid Profile     Respiratory   Allergic rhinitis    Complaining of some postnasal drip symptoms and has swollen inferior turbinates on exam.  Reminded her of prescription of Flonase will represcribe and  have her do this.  Okay to continue Zyrtec.        Digestive   Gastritis, Helicobacter pylori    She has grossly completed a second treatment course at next visit we will try to test for cure.        Other   MDD (major depressive disorder), recurrent episode, moderate ([facility]) (Chronic)       01/11/2023   10:34 AM 11/30/2022   10:15 AM 11/28/2022    2:45 PM  PHQ9 SCORE ONLY  PHQ-9 Total Score 8 12      Information is confidential and restricted. Go to Review Flowsheets to unlock data.  PHQ-9 has improved some we will increase Lexapro up to 20 mg      Relevant Medications   escitalopram (LEXAPRO) 20 MG tablet   Generalized anxiety disorder (Chronic)       01/11/2023   10:35 AM 11/30/2022   10:16 AM 11/28/2022    2:42 PM 09/14/2022   12:14 PM  GAD 7 : Generalized Anxiety Score  Nervous, Anxious, on Edge '2 2  1  '$ Control/stop worrying '1 2  1  '$ Worry too much - different things '1 1  1  '$ Trouble relaxing 2 0  1  Restless 1 0  0  Easily annoyed or irritable '1 2  1  '$ Afraid - awful might happen '2 2  1  '$ Total GAD 7 Score '10 9  6  '$ Anxiety Difficulty         Information is confidential and restricted. Go to Review Flowsheets to unlock data.   GAd-7 essentially unchanged.  Sleeping a little bit better we will increase Lexapro to 20 mg daily.        Relevant Medications   escitalopram (LEXAPRO) 20 MG tablet   Other Relevant Orders   TSH   T4, Free   T3   Vitamin D insufficiency   Relevant Orders   Vitamin D (25 hydroxy)   Neck pain, musculoskeletal    Continue tylenol. Add PRN aleve OTC.      Insomnia    Insomnia slightly improved with change from sertraline to Lexapro.  Continues with melatonin 5 mg nightly.  Continue to monitor      Other Visit Diagnoses     Impaired glucose tolerance       Relevant Orders   Hemoglobin A1c       Return in about 4 weeks (around 02/08/2023).    Lucious Groves, DO

## 2023-01-11 NOTE — Assessment & Plan Note (Addendum)
    01/11/2023   10:34 AM 11/30/2022   10:15 AM 11/28/2022    2:45 PM  PHQ9 SCORE ONLY  PHQ-9 Total Score 8 12      Information is confidential and restricted. Go to Review Flowsheets to unlock data.   PHQ-9 has improved some we will increase Lexapro up to 20 mg

## 2023-01-11 NOTE — Assessment & Plan Note (Signed)
Continue tylenol. Add PRN aleve OTC.

## 2023-01-11 NOTE — Assessment & Plan Note (Signed)
Insomnia slightly improved with change from sertraline to Lexapro.  Continues with melatonin 5 mg nightly.  Continue to monitor

## 2023-01-12 LAB — HEMOGLOBIN A1C
Est. average glucose Bld gHb Est-mCnc: 126 mg/dL
Hgb A1c MFr Bld: 6 % — ABNORMAL HIGH (ref 4.8–5.6)

## 2023-01-12 LAB — LIPID PANEL
Chol/HDL Ratio: 3.4 ratio (ref 0.0–4.4)
Cholesterol, Total: 223 mg/dL — ABNORMAL HIGH (ref 100–199)
HDL: 66 mg/dL (ref >39)
LDL Chol Calc (NIH): 141 mg/dL — ABNORMAL HIGH (ref 0–99)
Triglycerides: 93 mg/dL (ref 0–149)
VLDL Cholesterol Cal: 16 mg/dL (ref 5–40)

## 2023-01-12 LAB — VITAMIN D 25 HYDROXY (VIT D DEFICIENCY, FRACTURES): Vit D, 25-Hydroxy: 33.9 ng/mL (ref 30.0–100.0)

## 2023-01-12 LAB — T3: T3, Total: 123 ng/dL (ref 71–180)

## 2023-01-12 LAB — TSH: TSH: 0.65 u[IU]/mL (ref 0.450–4.500)

## 2023-01-12 LAB — T4, FREE: Free T4: 1.69 ng/dL (ref 0.82–1.77)

## 2023-01-12 MED ORDER — ROSUVASTATIN CALCIUM 10 MG PO TABS: 10.0000 mg | ORAL_TABLET | Freq: Every day | ORAL | 3 refills | Status: DC

## 2023-01-12 NOTE — Addendum Note (Signed)
Addended by: Lucious Groves on: 01/12/2023 02:13 PM   Modules accepted: Orders

## 2023-01-25 ENCOUNTER — Telehealth: Payer: Self-pay

## 2023-01-25 NOTE — Telephone Encounter (Signed)
Lauren I dont think I will have time to call her today, but the foam in the urine sounds like it could be increased protein, would want her to come in for a visit with one of the residents to get that checked.

## 2023-01-25 NOTE — Telephone Encounter (Signed)
Pt is requesting a call back .Eileen Cameron She is wanting to speak to her PCP about her BP med she stated that they had spoke about her having  headaches   and he told her it will go away but she stated it has gotten worse .Eileen Cameron Also she stated that her urine has a lot of form  and she is worried about why that is happening

## 2023-01-25 NOTE — Telephone Encounter (Signed)
Returned call to patient. States that pain left side of head, traveling down left arm and left leg "has gotten worse." States, "it's a nerve thing, my left arm is shaking." States PCP told her to walk when this happens. She has done that with no relief. She thinks this is a side effect of metoprolol. Denies, CP, SHOB, fever, chills.  Also, c/o "foam" in urine for last week. Does note increased urination but she has also increased her water intake. Denies burning, pain, itching, urgency with urination.

## 2023-01-26 NOTE — Telephone Encounter (Signed)
Great, thanks

## 2023-01-26 NOTE — Telephone Encounter (Signed)
Spoke with patient. Given first available appt 3/28 at 1315.

## 2023-01-31 ENCOUNTER — Ambulatory Visit (HOSPITAL_COMMUNITY): Payer: 59 | Admitting: Mental Health

## 2023-02-01 ENCOUNTER — Ambulatory Visit (INDEPENDENT_AMBULATORY_CARE_PROVIDER_SITE_OTHER): Payer: 59

## 2023-02-01 VITALS — BP 125/66 | HR 65 | Temp 97.9°F | Ht 63.0 in | Wt 149.2 lb

## 2023-02-01 DIAGNOSIS — F411 Generalized anxiety disorder: Secondary | ICD-10-CM | POA: Diagnosis not present

## 2023-02-01 DIAGNOSIS — R399 Unspecified symptoms and signs involving the genitourinary system: Secondary | ICD-10-CM | POA: Diagnosis not present

## 2023-02-01 DIAGNOSIS — M542 Cervicalgia: Secondary | ICD-10-CM

## 2023-02-01 DIAGNOSIS — I10 Essential (primary) hypertension: Secondary | ICD-10-CM

## 2023-02-01 NOTE — Patient Instructions (Signed)
Eileen Cameron, it was a pleasure seeing you today!  Today we discussed: Urinary symptoms - will call with lab results and treat if indicated High blood pressure  - continue on current medicine Neck pain  - take aleve, apply heat as needed, apply pain relieving cream as needed  I have ordered the following labs today:  Lab Orders  No laboratory test(s) ordered today     Tests ordered today:  UA  Referrals ordered today:   Referral Orders  No referral(s) requested today     I have ordered the following medication/changed the following medications:   Stop the following medications: There are no discontinued medications.   Start the following medications: No orders of the defined types were placed in this encounter.    Follow-up:  next week as scheduled    Please make sure to arrive 15 minutes prior to your next appointment. If you arrive late, you may be asked to reschedule.   We look forward to seeing you next time. Please call our clinic at [phone] if you have any questions or concerns. The best time to call is Monday-Friday from 9am-4pm, but there is someone available 24/7. If after hours or the weekend, call the main hospital number and ask for the Internal Medicine Resident On-Call. If you need medication refills, please notify your pharmacy one week in advance and they will send [place] a request.  Thank you for letting us take part in your care. Wishing you the best!  Thank you, Linward Natal, MD

## 2023-02-01 NOTE — Assessment & Plan Note (Signed)
Patient continues to endorse generalized anxiety. Reports she cannot identify effect of medication at this point but has only been taking current dose for a few weeks. Encouraged to continue for at least a few more weeks before trying a different medication. Advised to stop taking turmeric due to increased bleeding risk. -continue lexapro 20 mg daily

## 2023-02-01 NOTE — Progress Notes (Signed)
CC: urinary sxs  HPI:  [name] is a 72 y.o. with past medical history as below who presents for urinary sxs. See detailed assessment and plan for HPI.  Past Medical History:  Diagnosis Date   Adrenal incidentaloma ([place]) 07/08/2015   1.0 by 1.4 cm lateral limb left adrenal mass on CT abdomen on 07/08/15 Followed up in April 2018, stable no further imaging required.   Allergy    seasonal allergies, ace inhibitors--angioedema   Anxiety    Hyperlipidemia    Hypertension    Leg cramps 11/18/2015   Mold exposure 01/29/2020   Neck pain, musculoskeletal 12/27/2017   Post-nasal drip 10/10/2016   Right shoulder pain 04/20/2016   Review of Systems:  See detailed assessment and plan for pertinent ROS.  Physical Exam:  Vitals:   02/01/23 1310 02/01/23 1350  BP: (!) 143/65 125/66  Pulse: 73 65  Temp: 97.9 F (36.6 C)   TempSrc: Oral   SpO2: 100%   Weight: 149 lb 3.2 oz (67.7 kg)   Height: 5\' 3"  (1.6 m)    Physical Exam Constitutional:      General: She is not in acute distress. HENT:     Head: Normocephalic and atraumatic.  Eyes:     Extraocular Movements: Extraocular movements intact.  Cardiovascular:     Rate and Rhythm: Normal rate and regular rhythm.     Heart sounds: No murmur heard. Pulmonary:     Effort: Pulmonary effort is normal.     Breath sounds: No wheezing, rhonchi or rales.  Musculoskeletal:        General: Normal range of motion.     Cervical back: Normal range of motion.     Comments: Increased tension at left trapezius and cervical paraspinal muscles.  Skin:    General: Skin is warm and dry.  Neurological:     General: No focal deficit present.     Mental Status: She is alert and oriented to person, place, and time.     Sensory: No sensory deficit.     Motor: No weakness.  Psychiatric:        Mood and Affect: Mood normal.        Behavior: Behavior normal.      Assessment & Plan:   See Encounters Tab for problem based  charting.  Essential hypertension Blood pressure within goal at 125/66 on current regimen. Denies symptoms of hyper or hypotension including chest pain, shortness of breath, vision changes, headaches, dizziness. Endorses consistent medication compliance. -continue olmesartan-amlodipine-HCTZ 40-5-25 mg daily  Generalized anxiety disorder Patient continues to endorse generalized anxiety. Reports she cannot identify effect of medication at this point but has only been taking current dose for a few weeks. Encouraged to continue for at least a few more weeks before trying a different medication. Advised to stop taking turmeric due to increased bleeding risk. -continue lexapro 20 mg daily  Neck pain, musculoskeletal Patient continues to report left sided neck pain that extends to ipsilateral arm and skull base. Worse after sleeping (sleeps on left side). Denies weakness, paraesthesias. Reports tylenol dose not provide relief. Voltaren stopped providing benefit but she found an OTC topical analgesic that does provide relief. Heating helps as well. I feel this is most likely muscle tension / tension headache. She was recommended to take Aleve at last OV but has not done so. -discussed trial of Aleve -continue applying heat as needed -continue OTC topical pain reliever     Patient discussed with Dr. Daryll Drown

## 2023-02-01 NOTE — Assessment & Plan Note (Signed)
Blood pressure within goal at 125/66 on current regimen. Denies symptoms of hyper or hypotension including chest pain, shortness of breath, vision changes, headaches, dizziness. Endorses consistent medication compliance. -continue olmesartan-amlodipine-HCTZ 40-5-25 mg daily

## 2023-02-01 NOTE — Assessment & Plan Note (Signed)
Patient continues to report left sided neck pain that extends to ipsilateral arm and skull base. Worse after sleeping (sleeps on left side). Denies weakness, paraesthesias. Reports tylenol dose not provide relief. Voltaren stopped providing benefit but she found an OTC topical analgesic that does provide relief. Heating helps as well. I feel this is most likely muscle tension / tension headache. She was recommended to take Aleve at last OV but has not done so. -discussed trial of Aleve -continue applying heat as needed -continue OTC topical pain reliever

## 2023-02-02 LAB — URINALYSIS, ROUTINE W REFLEX MICROSCOPIC
Bilirubin, UA: NEGATIVE
Glucose, UA: NEGATIVE
Ketones, UA: NEGATIVE
Leukocytes,UA: NEGATIVE
Nitrite, UA: NEGATIVE
Protein,UA: NEGATIVE
RBC, UA: NEGATIVE
Specific Gravity, UA: 1.005 (ref 1.005–1.030)
Urobilinogen, Ur: 0.2 mg/dL (ref 0.2–1.0)
pH, UA: 6.5 (ref 5.0–7.5)

## 2023-02-08 ENCOUNTER — Ambulatory Visit (INDEPENDENT_AMBULATORY_CARE_PROVIDER_SITE_OTHER): Payer: 59 | Admitting: Student

## 2023-02-08 ENCOUNTER — Encounter: Payer: Self-pay | Admitting: Student

## 2023-02-08 VITALS — BP 140/65 | HR 70 | Temp 98.0°F | Ht 63.0 in | Wt 151.5 lb

## 2023-02-08 DIAGNOSIS — K2970 Gastritis, unspecified, without bleeding: Secondary | ICD-10-CM

## 2023-02-08 DIAGNOSIS — L299 Pruritus, unspecified: Secondary | ICD-10-CM | POA: Diagnosis not present

## 2023-02-08 DIAGNOSIS — I10 Essential (primary) hypertension: Secondary | ICD-10-CM

## 2023-02-08 DIAGNOSIS — B9681 Helicobacter pylori [H. pylori] as the cause of diseases classified elsewhere: Secondary | ICD-10-CM | POA: Diagnosis not present

## 2023-02-08 NOTE — Progress Notes (Signed)
Internal Medicine Clinic Attending  Case discussed with Dr. White  at the time of the visit.  We reviewed the resident's history and exam and pertinent patient test results.  I agree with the assessment, diagnosis, and plan of care documented in the resident's note.  

## 2023-02-08 NOTE — Patient Instructions (Signed)
It was a pleasure seeing you in clinic. Today we discussed:  Itching Please try a fragrance free moisturizer to see if this improve you symptoms  H.Pylori We will do a breath test to check for eradication of the bacteria   I do not think you would qualify for a handicap placard.  Your blood pressure is doing well and I do not think this is causing your symptoms. The feeling you describe may be your body reacting to changes in position. Please stand slowly when getting out of the car and wait until the dizziness subsides before walking to help prevent falls.

## 2023-02-10 LAB — H. PYLORI BREATH TEST: H pylori Breath Test: NEGATIVE

## 2023-02-12 NOTE — Progress Notes (Signed)
Internal Medicine Clinic Attending  Case discussed with Dr. Liang  at the time of the visit.  We reviewed the resident's history and exam and pertinent patient test results.  I agree with the assessment, diagnosis, and plan of care documented in the resident's note.  

## 2023-02-12 NOTE — Assessment & Plan Note (Signed)
BP mildly elevated at 141/66 and 140/65. Reports compliance with olmesartan-amlodipine-HCTZ. Reports she just took her medication prior to her OV today. Reports some dizziness with standing after getting out of the care. This lasts about 1 minute. Able to ambulate after this without falls. Normal orthostatics today. Will continue on current medications. Advised she stand slowly and allow her body to adjust when moving from seated to sitting.

## 2023-02-12 NOTE — Progress Notes (Signed)
Established Patient Office Visit  Subjective   Patient ID: Eileen Cameron, female    DOB: February 04, 1951  Age: 72 y.o. MRN: [id number]  Chief Complaint  Patient presents with   Pruritis    "Itching all over" started on scalp now itching all over    Dizziness   Arm Pain    Eileen Cameron is a 72 y.o. person living with a history listed below who presents to clinic for follow up of h pylori gastritis. Please refer to problem based charting for further details and assessment and plan of current problem and chronic medical conditions.     Patient Active Problem List   Diagnosis Date Noted   Allergic rhinitis 01/11/2023   Aortic atherosclerosis 01/10/2023   Gastritis, Helicobacter pylori 12/12/2022   Auditory hallucination 11/02/2022   Mild cognitive impairment 09/15/2022   Piriformis syndrome, left 05/11/2022   Muscle spasm of back 02/09/2022   Moderate obstructive sleep apnea 02/09/2022   Iron deficiency 02/09/2022   Venous insufficiency 12/11/2021   Dyspepsia 10/08/2021   Diuretic-induced hypokalemia 10/08/2021   Headache in back of head 09/21/2021   Generalized anxiety disorder 07/14/2021   Gastroenteritis 01/24/2021   MDD (major depressive disorder), recurrent episode, moderate 08/25/2019   Bilateral temporomandibular joint pain 05/01/2019   Palpitations 02/18/2018   Visual floaters, bilateral 02/18/2018   Primary osteoarthritis of right knee 02/14/2018   Rhinitis, chronic 01/22/2018   Neck pain, musculoskeletal 12/27/2017   Vertigo 12/27/2017   Obesity (BMI 30.0-34.9) 08/09/2017   Vitamin D insufficiency 08/07/2017   Myalgia due to statin 02/08/2017   Risk for coronary artery disease between 10% and 20% in next 10 years 08/11/2016   History of statin therapy 08/10/2016   Encounter for immunization 07/18/2016   GERD (gastroesophageal reflux disease) 11/28/2014   Pruritus 10/16/2014   Insomnia 09/18/2014   Seasonal allergies 12/31/2013   Fatigue due to sleep pattern  disturbance 05/13/2013   Hot flashes 04/08/2013   Preventative health care 08/26/2012   Essential hypertension 04/01/2009   Lumbar back pain with radiculopathy affecting right lower extremity 04/01/2009      ROS: negative as per HPI     Objective:     BP (!) 140/65 (BP Location: Left Arm, Cuff Size: Small)   Pulse 70   Temp 98 F (36.7 C) (Oral)   Ht 5\' 3"  (1.6 m)   Wt 151 lb 8 oz (68.7 kg)   SpO2 100%   BMI 26.84 kg/m  BP Readings from Last 3 Encounters:  02/08/23 (!) 140/65  02/01/23 125/66  01/11/23 130/62      Physical Exam Constitutional:      Appearance: Normal appearance.  HENT:     Head: Normocephalic and atraumatic.     Mouth/Throat:     Mouth: Mucous membranes are moist.     Pharynx: Oropharynx is clear.  Eyes:     Extraocular Movements: Extraocular movements intact.     Conjunctiva/sclera: Conjunctivae normal.     Pupils: Pupils are equal, round, and reactive to light.  Cardiovascular:     Rate and Rhythm: Normal rate and regular rhythm.     Pulses: Normal pulses.  Pulmonary:     Effort: Pulmonary effort is normal.     Breath sounds: No rhonchi or rales.  Abdominal:     General: Abdomen is flat. Bowel sounds are normal. There is no distension.     Palpations: Abdomen is soft. There is no mass.     Tenderness: There is no abdominal tenderness.  Musculoskeletal:        General: Normal range of motion.     Right lower leg: No edema.     Left lower leg: No edema.  Skin:    General: Skin is warm and dry.     Capillary Refill: Capillary refill takes less than 2 seconds.     Comments: No rash or lesions of the skin  Neurological:     General: No focal deficit present.     Mental Status: She is alert and oriented to person, place, and time.  Psychiatric:        Mood and Affect: Mood normal.        Behavior: Behavior normal.      Results for orders placed or performed in visit on 02/08/23  H. pylori breath test  Result Value Ref Range   H  pylori Breath Test Negative Negative    Last metabolic panel Lab Results  Component Value Date   GLUCOSE 80 11/29/2022   NA 143 11/29/2022   K 4.2 11/29/2022   CL 100 11/29/2022   CO2 24 11/29/2022   BUN 10 11/29/2022   CREATININE 0.92 11/29/2022   EGFR 66 11/29/2022   CALCIUM 9.9 11/29/2022   PHOS 3.4 08/10/2019   PROT 7.7 11/02/2022   ALBUMIN 4.3 11/02/2022   LABGLOB 2.8 02/09/2022   AGRATIO 1.7 02/09/2022   BILITOT 0.8 11/02/2022   ALKPHOS 52 11/02/2022   AST 20 11/02/2022   ALT 9 11/02/2022   ANIONGAP 9 11/02/2022      The 10-year ASCVD risk score (Arnett DK, et al., 2019) is: 18.4%    Assessment & Plan:   Problem List Items Addressed This Visit       Cardiovascular and Mediastinum   Essential hypertension (Chronic)    BP mildly elevated at 141/66 and 140/65. Reports compliance with olmesartan-amlodipine-HCTZ. Reports she just took her medication prior to her OV today. Reports some dizziness with standing after getting out of the care. This lasts about 1 minute. Able to ambulate after this without falls. Normal orthostatics today. Will continue on current medications. Advised she stand slowly and allow her body to adjust when moving from seated to sitting.         Digestive   Gastritis, Helicobacter pylori - Primary    Reports she is fasting today. Not on PPI or other over the counter medications of heart burn or GI symptoms. Completed course of treatment bout 2 months ago will check H. Pylori breath test today for cure.        Relevant Orders   H. pylori breath test (Completed)     Musculoskeletal and Integument   Pruritus    Reports generalized itching in the last month. Started at frontal hair line and now all over. No rash or skin changes with this. She is trying to change her moisturizer and detergents to see if this help. Recommended she try fragrance free formulations to see if this helps. Appears she has had similar symptoms in the past. She is not sure  if it improved with antihistamines. Advised she try an over the counter second generation antihistamine like Claritin, xyxal, or allegra. Would avoid benadryl given her complaints with dizziness with positional changes.        Return in about 3 months (around 05/10/2023).    Quincy Simmonds, MD

## 2023-02-12 NOTE — Assessment & Plan Note (Signed)
Reports she is fasting today. Not on PPI or other over the counter medications of heart burn or GI symptoms. Completed course of treatment bout 2 months ago will check H. Pylori breath test today for cure.

## 2023-02-12 NOTE — Assessment & Plan Note (Signed)
Reports generalized itching in the last month. Started at frontal hair line and now all over. No rash or skin changes with this. She is trying to change her moisturizer and detergents to see if this help. Recommended she try fragrance free formulations to see if this helps. Appears she has had similar symptoms in the past. She is not sure if it improved with antihistamines. Advised she try an over the counter second generation antihistamine like Claritin, xyxal, or allegra. Would avoid benadryl given her complaints with dizziness with positional changes.

## 2023-02-12 NOTE — Addendum Note (Signed)
Addended by: Debe Coder B on: 02/12/2023 02:13 PM   Modules accepted: Level of Service

## 2023-03-06 ENCOUNTER — Other Ambulatory Visit: Payer: Self-pay

## 2023-03-06 MED ORDER — ESCITALOPRAM OXALATE 20 MG PO TABS: 20.0000 mg | ORAL_TABLET | Freq: Every day | ORAL | 3 refills | Status: DC

## 2023-03-29 ENCOUNTER — Other Ambulatory Visit: Payer: Self-pay

## 2023-03-29 ENCOUNTER — Encounter: Payer: Self-pay | Admitting: Internal Medicine

## 2023-03-29 ENCOUNTER — Ambulatory Visit (INDEPENDENT_AMBULATORY_CARE_PROVIDER_SITE_OTHER): Payer: 59 | Admitting: Internal Medicine

## 2023-03-29 VITALS — BP 132/72 | HR 63 | Temp 98.2°F | Ht 63.0 in | Wt 156.5 lb

## 2023-03-29 DIAGNOSIS — I10 Essential (primary) hypertension: Secondary | ICD-10-CM | POA: Diagnosis not present

## 2023-03-29 DIAGNOSIS — F411 Generalized anxiety disorder: Secondary | ICD-10-CM | POA: Diagnosis not present

## 2023-03-29 DIAGNOSIS — M159 Polyosteoarthritis, unspecified: Secondary | ICD-10-CM

## 2023-03-30 NOTE — Assessment & Plan Note (Signed)
Discussed that think she has a component of arthritis in her right hip as well as the right knee.  Functionally she is doing very well continue Voltaren gel where it is helping.  Add Tylenol.  Completed paperwork for handicap placard

## 2023-03-30 NOTE — Assessment & Plan Note (Signed)
Likely a small component of whitecoat but blood pressures are good at home.  Will continue her Tribenzor 40 - 5 - 25 daily

## 2023-03-30 NOTE — Progress Notes (Signed)
Established Patient Office Visit  Subjective   Patient ID: Eileen Cameron, female    DOB: September 06, 1951  Age: 72 y.o. MRN: [id number]  Chief Complaint  Patient presents with   Follow-up   Right hip pain   [name] is here for follow-up of anxiety and hypertension.  She notes that her anxiety is doing much better she is thankful for the Lexapro medication but also wonders how long she will need to take it.  She is taking her antihypertensives as directed and not having any side effects.  She gets nervous when she comes to the office but has been checking her blood pressure at home and brings in some of the readings.  She is generally well-controlled at home.  She notes she is still having occasional right hip pain she will use some arthritis gel to the area which helps she does not usually take Tylenol.  This sometimes limits her walking a bit some days more than others.  She is wondering about a handicap placard.     Objective:     BP 132/72   Pulse 63   Temp 98.2 F (36.8 C) (Oral)   Ht 5\' 3"  (1.6 m)   Wt 156 lb 8 oz (71 kg)   SpO2 100% Comment: RA  BMI 27.72 kg/m  BP Readings from Last 3 Encounters:  03/29/23 132/72  02/08/23 (!) 140/65  02/01/23 125/66   Wt Readings from Last 3 Encounters:  03/29/23 156 lb 8 oz (71 kg)  02/08/23 151 lb 8 oz (68.7 kg)  02/01/23 149 lb 3.2 oz (67.7 kg)      Physical Exam Constitutional:      Appearance: Normal appearance.  Cardiovascular:     Rate and Rhythm: Normal rate and regular rhythm.  Pulmonary:     Effort: Pulmonary effort is normal.  Musculoskeletal:     Right hip: No bony tenderness or crepitus. Decreased range of motion (slightly decreased int/ext rotation with some pain).     Left hip: No crepitus. Normal range of motion.  Neurological:     Mental Status: She is alert.  Psychiatric:        Mood and Affect: Mood normal.        Behavior: Behavior normal.     Comments: Demenor markedly less anxious compared to last few  visits.      No results found for any visits on 03/29/23.  Last CBC Lab Results  Component Value Date   WBC 5.1 11/02/2022   HGB 13.0 11/02/2022   HCT 39.2 11/02/2022   MCV 91.4 11/02/2022   MCH 30.3 11/02/2022   RDW 13.9 11/02/2022   PLT 261 11/02/2022   Last metabolic panel Lab Results  Component Value Date   GLUCOSE 80 11/29/2022   NA 143 11/29/2022   K 4.2 11/29/2022   CL 100 11/29/2022   CO2 24 11/29/2022   BUN 10 11/29/2022   CREATININE 0.92 11/29/2022   EGFR 66 11/29/2022   CALCIUM 9.9 11/29/2022   PHOS 3.4 08/10/2019   PROT 7.7 11/02/2022   ALBUMIN 4.3 11/02/2022   LABGLOB 2.8 02/09/2022   AGRATIO 1.7 02/09/2022   BILITOT 0.8 11/02/2022   ALKPHOS 52 11/02/2022   AST 20 11/02/2022   ALT 9 11/02/2022   ANIONGAP 9 11/02/2022      The 10-year ASCVD risk score (Arnett DK, et al., 2019) is: 17.8%    Assessment & Plan:   Problem List Items Addressed This Visit  Cardiovascular and Mediastinum   Essential hypertension (Chronic)    Likely a small component of whitecoat but blood pressures are good at home.  Will continue her Tribenzor 40 - 5 - 25 daily        Musculoskeletal and Integument   Primary osteoarthritis involving multiple joints    Discussed that think she has a component of arthritis in her right hip as well as the right knee.  Functionally she is doing very well continue Voltaren gel where it is helping.  Add Tylenol.  Completed paperwork for handicap placard        Other   Generalized anxiety disorder - Primary (Chronic)    Anxiety much improved on Lexapro 20 mg daily.  Discussed that she should take this for at least a year but we may want to continue much longer given how drastically improved her symptoms are.       Return in about 6 months (around 09/29/2023).    Gust Rung, DO

## 2023-03-30 NOTE — Assessment & Plan Note (Signed)
Anxiety much improved on Lexapro 20 mg daily.  Discussed that she should take this for at least a year but we may want to continue much longer given how drastically improved her symptoms are.

## 2023-04-09 ENCOUNTER — Other Ambulatory Visit: Payer: Self-pay

## 2023-04-09 MED ORDER — FLUTICASONE PROPIONATE 50 MCG/ACT NA SUSP: 2.0000 | Freq: Every day | NASAL | 3 refills | Status: DC

## 2023-07-18 ENCOUNTER — Ambulatory Visit (INDEPENDENT_AMBULATORY_CARE_PROVIDER_SITE_OTHER): Payer: 59 | Admitting: *Deleted

## 2023-07-18 DIAGNOSIS — Z23 Encounter for immunization: Secondary | ICD-10-CM

## 2023-09-12 DIAGNOSIS — R293 Abnormal posture: Secondary | ICD-10-CM | POA: Diagnosis not present

## 2023-09-12 DIAGNOSIS — M25512 Pain in left shoulder: Secondary | ICD-10-CM | POA: Diagnosis not present

## 2023-09-12 DIAGNOSIS — M542 Cervicalgia: Secondary | ICD-10-CM | POA: Diagnosis not present

## 2023-09-18 DIAGNOSIS — R293 Abnormal posture: Secondary | ICD-10-CM | POA: Diagnosis not present

## 2023-09-18 DIAGNOSIS — M542 Cervicalgia: Secondary | ICD-10-CM | POA: Diagnosis not present

## 2023-09-18 DIAGNOSIS — M25512 Pain in left shoulder: Secondary | ICD-10-CM | POA: Diagnosis not present

## 2023-09-20 DIAGNOSIS — M25512 Pain in left shoulder: Secondary | ICD-10-CM | POA: Diagnosis not present

## 2023-09-20 DIAGNOSIS — M542 Cervicalgia: Secondary | ICD-10-CM | POA: Diagnosis not present

## 2023-09-20 DIAGNOSIS — R293 Abnormal posture: Secondary | ICD-10-CM | POA: Diagnosis not present

## 2023-09-25 DIAGNOSIS — M25512 Pain in left shoulder: Secondary | ICD-10-CM | POA: Diagnosis not present

## 2023-09-25 DIAGNOSIS — M542 Cervicalgia: Secondary | ICD-10-CM | POA: Diagnosis not present

## 2023-09-25 DIAGNOSIS — R293 Abnormal posture: Secondary | ICD-10-CM | POA: Diagnosis not present

## 2023-09-27 DIAGNOSIS — M25512 Pain in left shoulder: Secondary | ICD-10-CM | POA: Diagnosis not present

## 2023-09-27 DIAGNOSIS — R293 Abnormal posture: Secondary | ICD-10-CM | POA: Diagnosis not present

## 2023-09-27 DIAGNOSIS — M542 Cervicalgia: Secondary | ICD-10-CM | POA: Diagnosis not present

## 2023-10-01 DIAGNOSIS — M542 Cervicalgia: Secondary | ICD-10-CM | POA: Diagnosis not present

## 2023-10-01 DIAGNOSIS — M25512 Pain in left shoulder: Secondary | ICD-10-CM | POA: Diagnosis not present

## 2023-10-01 DIAGNOSIS — R293 Abnormal posture: Secondary | ICD-10-CM | POA: Diagnosis not present

## 2023-10-09 DIAGNOSIS — M25512 Pain in left shoulder: Secondary | ICD-10-CM | POA: Diagnosis not present

## 2023-10-09 DIAGNOSIS — M542 Cervicalgia: Secondary | ICD-10-CM | POA: Diagnosis not present

## 2023-10-09 DIAGNOSIS — R293 Abnormal posture: Secondary | ICD-10-CM | POA: Diagnosis not present

## 2023-10-10 ENCOUNTER — Ambulatory Visit: Payer: 59

## 2023-10-10 VITALS — Ht 63.0 in | Wt 160.0 lb

## 2023-10-10 DIAGNOSIS — Z0000 Encounter for general adult medical examination without abnormal findings: Secondary | ICD-10-CM | POA: Diagnosis not present

## 2023-10-10 NOTE — Patient Instructions (Signed)
Ms. Grindel , Thank you for taking time to come for your Medicare Wellness Visit. I appreciate your ongoing commitment to your health goals. Please review the following plan we discussed and let me know if I can assist you in the future.   Referrals/Orders/Follow-Ups/Clinician Recommendations: You are due for a mammogram.  Please discuss with Dr. Mikey Bussing during your up coming visit, about placing an order for the mammogram.  Also, please discuss getting a Shingles vaccine with Dr. Mikey Bussing.  It was nice talking to you today and keep up the good work.  Have a Merry Christmas.  This is a list of the screening recommended for you and due dates:  Health Maintenance  Topic Date Due   COVID-19 Vaccine (4 - 2023-24 season) 10/26/2023*   Mammogram  11/06/2023*   Zoster (Shingles) Vaccine (1 of 2) 11/06/2023*   DTaP/Tdap/Td vaccine (3 - Td or Tdap) 07/12/2024   Medicare Annual Wellness Visit  10/09/2024   Colon Cancer Screening  09/06/2032   Pneumonia Vaccine  Completed   Flu Shot  Completed   DEXA scan (bone density measurement)  Completed   Hepatitis C Screening  Completed   HPV Vaccine  Aged Out  *Topic was postponed. The date shown is not the original due date.    Advanced directives: (In Chart) A copy of your advanced directives are scanned into your chart should your provider ever need it.  Next Medicare Annual Wellness Visit scheduled for next year: Yes

## 2023-10-10 NOTE — Progress Notes (Signed)
Subjective:   Eileen Cameron is a 72 y.o. female who presents for Medicare Annual (Subsequent) preventive examination.  Visit Complete: Virtual I connected with  Eileen Cameron on 10/10/23 by a audio enabled telemedicine application and verified that I am speaking with the correct person using two identifiers.  Patient Location: Home  Provider Location: Home Office  I discussed the limitations of evaluation and management by telemedicine. The patient expressed understanding and agreed to proceed.  Vital Signs: Because this visit was a virtual/telehealth visit, some criteria may be missing or patient reported. Any vitals not documented were not able to be obtained and vitals that have been documented are patient reported.   Cardiac Risk Factors include: advanced age (>60men, >28 women);hypertension;Other (see comment), Risk factor comments: Aortic atherosclerosis,     Objective:    Today's Vitals   10/10/23 1103  Weight: 160 lb (72.6 kg)  Height: 5\' 3"  (1.6 m)   Body mass index is 28.34 kg/m.     10/10/2023   11:18 AM 03/29/2023   10:22 AM 02/08/2023   11:15 AM 02/01/2023    1:13 PM 01/11/2023   10:21 AM 12/12/2022   12:29 PM 11/30/2022    9:45 AM  Advanced Directives  Does Patient Have a Medical Advance Directive? Yes Yes Yes Yes Yes Yes Yes  Type of Estate agent of [place];Living will Healthcare Power of [place];Living will Living will;Healthcare Power of Attorney Living will;Healthcare Power of [employer] Power of [place];Living will Healthcare Power of [place];Living will Healthcare Power of [place];Living will  Does patient want to make changes to medical advance directive? No - Patient declined No - Patient declined No - Patient declined No - Patient declined No - Patient declined  No - Patient declined  Copy of Healthcare Power of Attorney in Chart? Yes - validated most recent copy scanned in chart (See row information) Yes - validated most  recent copy scanned in chart (See row information) Yes - validated most recent copy scanned in chart (See row information) Yes - validated most recent copy scanned in chart (See row information) Yes - validated most recent copy scanned in chart (See row information) Yes - validated most recent copy scanned in chart (See row information) Yes - validated most recent copy scanned in chart (See row information)    Current Medications (verified) Outpatient Encounter Medications as of 10/10/2023  Medication Sig   acetaminophen (TYLENOL) 500 MG tablet Take 500 mg by mouth every 6 (six) hours as needed for headache.   Cholecalciferol (VITAMIN D3 PO) Take 1 tablet by mouth daily.   Cyanocobalamin (VITAMIN B-12 PO) Take by mouth.   diclofenac Sodium (VOLTAREN) 1 % GEL Apply 4 g topically 4 (four) times daily.   escitalopram (LEXAPRO) 20 MG tablet Take 1 tablet (20 mg total) by mouth daily.   Fexofenadine HCl (ALLEGRA ALLERGY PO) Take by mouth.   fluticasone (FLONASE) 50 MCG/ACT nasal spray Place 2 sprays into both nostrils daily.   meclizine (ANTIVERT) 25 MG tablet TAKE 1 TABLET(25 MG) BY MOUTH THREE TIMES DAILY AS NEEDED FOR DIZZINESS   melatonin 5 MG TABS Take 1 tablet (5 mg total) by mouth at bedtime as needed.   metoprolol tartrate (LOPRESSOR) 100 MG tablet Take 1 tablet by mouth two hours prior to scan   Olmesartan-amLODIPine-HCTZ 40-5-25 MG TABS Take 1 tablet by mouth daily.   Omega-3 Fatty Acids (FISH OIL PO) Take 1 capsule by mouth daily.   Pyridoxine HCl (VITAMIN B-6 PO) Take by  mouth.   rosuvastatin (CRESTOR) 10 MG tablet Take 1 tablet (10 mg total) by mouth daily.   UNABLE TO FIND daily. Med Name: Ashwagandha Vitamin 2 daily   vitamin C (ASCORBIC ACID) 500 MG tablet Take 500 mg by mouth daily.   zinc gluconate 50 MG tablet Take 50 mg by mouth every other day.   omeprazole (PRILOSEC) 20 MG capsule Take 1 capsule (20 mg total) by mouth 2 (two) times daily before a meal for 14 days.   No  facility-administered encounter medications on file as of 10/10/2023.    Allergies (verified) Ace inhibitors and Benadryl [diphenhydramine]   History: Past Medical History:  Diagnosis Date   Adrenal incidentaloma (HCC) 07/08/2015   1.0 by 1.4 cm lateral limb left adrenal mass on CT abdomen on 07/08/15 Followed up in April 2018, stable no further imaging required.   Allergy    seasonal allergies, ace inhibitors--angioedema   Anxiety    Hyperlipidemia    Hypertension    Leg cramps 11/18/2015   Mold exposure 01/29/2020   Neck pain, musculoskeletal 12/27/2017   Post-nasal drip 10/10/2016   Right shoulder pain 04/20/2016   Past Surgical History:  Procedure Laterality Date   COLONOSCOPY N/A 01/08/2013   Procedure: COLONOSCOPY;  Surgeon: Willis Modena, MD;  Location: [facility];  Service: Endoscopy;  Laterality: N/A;   ECTOPIC PREGNANCY SURGERY  1970s   IR RADIOLOGY PERIPHERAL GUIDED IV START  07/02/2018   IR US GUIDE VASC ACCESS RIGHT  07/02/2018   right breast abscess drainage     TONSILLECTOMY     TUBAL LIGATION     Family History  Problem Relation Age of Onset   Heart disease Mother 31   Diabetes Mother    Hypertension Sister    Hypertension Sister    Hypertension Sister    Hypertension Brother    Hypertension Brother    Heart disease Maternal Grandmother    Migraines Granddaughter    Social History   Socioeconomic History   Marital status: Divorced    Spouse name: Not on file   Number of children: 1   Years of education: 12   Highest education level: Not on file  Occupational History   Occupation: Retired     Associate Professor: [employer]  Tobacco Use   Smoking status: Never    Passive exposure: Never   Smokeless tobacco: Never  Vaping Use   Vaping status: Never Used  Substance and Sexual Activity   Alcohol use: No    Alcohol/week: 0.0 standard drinks of alcohol   Drug use: No   Sexual activity: Not Currently  Other Topics Concern   Not on file  Social  History Narrative   Current Social History 06/14/2020        Patient lives alone in a ground floor apartment which is 1 story. There are not steps up to the entrance the patient uses.       Patient's method of transportation is personal car.      The highest level of education was high school diploma.      The patient currently retired from [employer].      Identified important Relationships are "My grandchildren (3), my daughter."       Pets : None       Interests / Fun: "I walk, talk to my neighbors."       Current Stressors: "Fear of having panic attacks/anxiety."       Religious / Personal Beliefs: "I accept Jesus Christ  as my Savior; I am saved."       L. Ducatte, BSN, RN-BC       Social Determinants of Health   Financial Resource Strain: Low Risk  (10/10/2023)   Overall Financial Resource Strain (CARDIA)    Difficulty of Paying Living Expenses: Not hard at all  Food Insecurity: No Food Insecurity (10/10/2023)   Hunger Vital Sign    Worried About Running Out of Food in the Last Year: Never true    Ran Out of Food in the Last Year: Never true  Transportation Needs: No Transportation Needs (10/10/2023)   PRAPARE - Administrator, Civil Service (Medical): No    Lack of Transportation (Non-Medical): No  Physical Activity: Insufficiently Active (10/10/2023)   Exercise Vital Sign    Days of Exercise per Week: 3 days    Minutes of Exercise per Session: 30 min  Stress: No Stress Concern Present (10/10/2023)   [employer] of Occupational Health - Occupational Stress Questionnaire    Feeling of Stress : Not at all  Social Connections: Moderately Isolated (10/10/2023)   Social Connection and Isolation Panel [NHANES]    Frequency of Communication with Friends and Family: More than three times a week    Frequency of Social Gatherings with Friends and Family: More than three times a week    Attends Religious Services: 1 to 4 times per year    Active Member of  [employer] or Organizations: No    Attends Engineer, structural: Never    Marital Status: Divorced    Tobacco Counseling Counseling given: Not Answered   Clinical Intake:  Pre-visit preparation completed: Yes  Pain : No/denies pain     BMI - recorded: 28.34 Nutritional Status: BMI 25 -29 Overweight Nutritional Risks: None Diabetes: No  How often do you need to have someone help you when you read instructions, pamphlets, or other written materials from your doctor or pharmacy?: 1 - Never  Interpreter Needed?: No  Information entered by :: Aundray Cartlidge, RMA   Activities of Daily Living    10/10/2023   11:06 AM 03/29/2023   10:21 AM  In your present state of health, do you have any difficulty performing the following activities:  Hearing? 0 0  Vision? 0 0  Difficulty concentrating or making decisions? 0 0  Walking or climbing stairs? 0 0  Dressing or bathing? 0 0  Doing errands, shopping? 0 0  Preparing Food and eating ? N   Using the Toilet? N   In the past six months, have you accidently leaked urine? N   Do you have problems with loss of bowel control? N   Managing your Medications? N   Managing your Finances? N   Housekeeping or managing your Housekeeping? N     Patient Care Team: Gust Rung, DO as PCP - General (Internal Medicine) Nahser, Deloris Ping, MD as PCP - Cardiology (Cardiology) Huston Foley, MD as Attending Physician (Neurology)  Indicate any recent Medical Services you may have received from other than [facility] providers in the past year (date may be approximate).     Assessment:   This is a routine wellness examination for Eileen Cameron.  Hearing/Vision screen Hearing Screening - Comments:: Denies hearing difficulties   Vision Screening - Comments:: Denies vision issues   Goals Addressed               This Visit's Progress     Continue walking 1 mile every day (pt-stated)  On track     Depression Screen    10/10/2023   11:26 AM  03/29/2023   10:21 AM 02/08/2023   10:43 AM 01/11/2023   10:34 AM 11/30/2022   10:15 AM 11/28/2022    2:45 PM 09/14/2022    9:40 AM  PHQ 2/9 Scores  PHQ - 2 Score 0 0 0 3 4  0  PHQ- 9 Score 1  4 8 12        Information is confidential and restricted. Go to Review Flowsheets to unlock data.    Fall Risk    10/10/2023   11:19 AM 03/29/2023   10:20 AM 02/08/2023   10:38 AM 02/01/2023    1:13 PM 01/11/2023   10:21 AM  Fall Risk   Falls in the past year? 0 1 1 1 1   Number falls in past yr: 0 0 1 1 0  Injury with Fall? 0 1 1 1 1   Risk for fall due to : No Fall Risks Other (Comment) Other (Comment)  Other (Comment)  Risk for fall due to: Comment  Passed out.   Vertigo.  Follow up Falls prevention discussed;Falls evaluation completed Falls evaluation completed;Falls prevention discussed Falls evaluation completed Falls evaluation completed Falls evaluation completed;Falls prevention discussed    MEDICARE RISK AT HOME: Medicare Risk at Home Any stairs in or around the home?: No Home free of loose throw rugs in walkways, pet beds, electrical cords, etc?: Yes Adequate lighting in your home to reduce risk of falls?: Yes Life alert?: No Use of a cane, walker or w/c?: No Grab bars in the bathroom?: Yes Shower chair or bench in shower?: No Elevated toilet seat or a handicapped toilet?: Yes  TIMED UP AND GO:  Was the test performed?  No    Cognitive Function:      09/14/2022    4:58 PM  Montreal Cognitive Assessment   Visuospatial/ Executive (0/5) 4  Naming (0/3) 2  Attention: Read list of digits (0/2) 2  Attention: Read list of letters (0/1) 1  Attention: Serial 7 subtraction starting at 100 (0/3) 1  Language: Repeat phrase (0/2) 1  Language : Fluency (0/1) 1  Abstraction (0/2) 1  Delayed Recall (0/5) 2  Orientation (0/6) 6  Total 21  Adjusted Score (based on education) 22      10/10/2023   11:20 AM  6CIT Screen  What Year? 0 points  What month? 0 points  What time? 0 points   Count back from 20 0 points  Months in reverse 0 points  Repeat phrase 0 points  Total Score 0 points    Immunizations Immunization History  Administered Date(s) Administered   Fluad Quad(high Dose 65+) 07/28/2021, 07/17/2022   Fluad Trivalent(High Dose 65+) 07/18/2023   Influenza Split 07/30/2012   Influenza, High Dose Seasonal PF 01/21/2021   Influenza,inj,Quad PF,6+ Mos 08/19/2013, 07/17/2014, 07/05/2015, 07/18/2016, 08/09/2017   Influenza-Unspecified 08/06/2018   Moderna Sars-Covid-2 Vaccination 12/30/2019, 01/27/2020, 01/21/2021   Pneumococcal Conjugate-13 04/20/2015   Pneumococcal Polysaccharide-23 03/31/2014, 01/21/2021   Tdap 07/30/2012, 07/12/2014   Zoster, Unspecified 02/26/2015    TDAP status: Up to date  Flu Vaccine status: Up to date  Pneumococcal vaccine status: Up to date  Covid-19 vaccine status: Completed vaccines  Qualifies for Shingles Vaccine? Yes   Zostavax completed Yes   Shingrix Completed?: No.    Education has been provided regarding the importance of this vaccine. Patient has been advised to call insurance company to determine out of pocket expense if they  have not yet received this vaccine. Advised may also receive vaccine at local pharmacy or Health Dept. Verbalized acceptance and understanding.  Screening Tests Health Maintenance  Topic Date Due   COVID-19 Vaccine (4 - 2023-24 season) 10/26/2023 (Originally 07/08/2023)   MAMMOGRAM  11/06/2023 (Originally 10/05/2021)   Zoster Vaccines- Shingrix (1 of 2) 11/06/2023 (Originally 02/15/1999)   DTaP/Tdap/Td (3 - Td or Tdap) 07/12/2024   Medicare Annual Wellness (AWV)  10/09/2024   Colonoscopy  09/06/2032   Pneumonia Vaccine 43+ Years old  Completed   INFLUENZA VACCINE  Completed   DEXA SCAN  Completed   Hepatitis C Screening  Completed   HPV VACCINES  Aged Out    Health Maintenance  There are no preventive care reminders to display for this patient.   Colorectal cancer screening: Type of  screening: Colonoscopy. Completed 09/06/2022. Repeat every 10 years  Mammogram status: Ordered 10/25/2023. Pt provided with contact info and advised to call to schedule appt.   Bone Density status: Completed 08/30/2020. Results reflect: Bone density results: OSTEOPENIA. Repeat every 2 years.  Lung Cancer Screening: (Low Dose CT Chest recommended if Age 82-80 years, 20 pack-year currently smoking OR have quit w/in 15years.) does not qualify.   Lung Cancer Screening Referral: N/A  Additional Screening:  Hepatitis C Screening: does qualify; Completed 08/10/2016  Vision Screening: Recommended annual ophthalmology exams for early detection of glaucoma and other disorders of the eye. Is the patient up to date with their annual eye exam?  No  Who is the provider or what is the name of the office in which the patient attends annual eye exams? [facility] If pt is not established with a provider, would they like to be referred to a provider to establish care? Yes .   Dental Screening: Recommended annual dental exams for proper oral hygiene   Community Resource Referral / Chronic Care Management: CRR required this visit?  No   CCM required this visit?  No     Plan:     I have personally reviewed and noted the following in the patient's chart:   Medical and social history Use of alcohol, tobacco or illicit drugs  Current medications and supplements including opioid prescriptions. Patient is not currently taking opioid prescriptions. Functional ability and status Nutritional status Physical activity Advanced directives List of other physicians Hospitalizations, surgeries, and ER visits in previous 12 months Vitals Screenings to include cognitive, depression, and falls Referrals and appointments  In addition, I have reviewed and discussed with patient certain preventive protocols, quality metrics, and best practice recommendations. A written personalized care plan for preventive  services as well as general preventive health recommendations were provided to patient.     Serene Kopf L Ivanna Cameron, CMA   10/10/2023   After Visit Summary: (MyChart) Due to this being a telephonic visit, the after visit summary with patients personalized plan was offered to patient via MyChart   Nurse Notes: Patient is due for a Shingrix vaccine and she is due for a mammogram.  Patient has an appointment coming up this month with Dr. Mikey Bussing for mammogram order can be placed during that appointment.  Patient would like to discuss if she should get Shingrix vaccine with provider during her up coming visit.  She had no other concerns to address today.

## 2023-10-11 DIAGNOSIS — M542 Cervicalgia: Secondary | ICD-10-CM | POA: Diagnosis not present

## 2023-10-11 DIAGNOSIS — M25512 Pain in left shoulder: Secondary | ICD-10-CM | POA: Diagnosis not present

## 2023-10-11 DIAGNOSIS — R293 Abnormal posture: Secondary | ICD-10-CM | POA: Diagnosis not present

## 2023-10-18 ENCOUNTER — Encounter: Payer: 59 | Admitting: Internal Medicine

## 2023-10-18 DIAGNOSIS — R293 Abnormal posture: Secondary | ICD-10-CM | POA: Diagnosis not present

## 2023-10-18 DIAGNOSIS — M25512 Pain in left shoulder: Secondary | ICD-10-CM | POA: Diagnosis not present

## 2023-10-18 DIAGNOSIS — M542 Cervicalgia: Secondary | ICD-10-CM | POA: Diagnosis not present

## 2023-10-25 ENCOUNTER — Encounter: Payer: Self-pay | Admitting: Internal Medicine

## 2023-10-25 ENCOUNTER — Ambulatory Visit (INDEPENDENT_AMBULATORY_CARE_PROVIDER_SITE_OTHER): Payer: 59 | Admitting: Internal Medicine

## 2023-10-25 VITALS — BP 129/80 | HR 63 | Temp 98.4°F | Ht 63.0 in | Wt 175.8 lb

## 2023-10-25 DIAGNOSIS — J302 Other seasonal allergic rhinitis: Secondary | ICD-10-CM | POA: Diagnosis not present

## 2023-10-25 DIAGNOSIS — I700 Atherosclerosis of aorta: Secondary | ICD-10-CM

## 2023-10-25 DIAGNOSIS — I10 Essential (primary) hypertension: Secondary | ICD-10-CM

## 2023-10-25 DIAGNOSIS — G44209 Tension-type headache, unspecified, not intractable: Secondary | ICD-10-CM | POA: Diagnosis not present

## 2023-10-25 DIAGNOSIS — F411 Generalized anxiety disorder: Secondary | ICD-10-CM | POA: Diagnosis not present

## 2023-10-25 MED ORDER — CETIRIZINE HCL 10 MG PO TABS: 10.0000 mg | ORAL_TABLET | Freq: Every day | ORAL | Status: AC

## 2023-10-25 NOTE — Progress Notes (Signed)
Established Patient Office Visit  Subjective   Patient ID: Eileen Cameron, female    DOB: April 07, 1951  Age: 72 y.o. MRN: [id number]  Chief Complaint  Patient presents with   6 Month F/U Visit    H/A's continue. Allergies.   Joe is here for 44-month follow-up of anxiety.  Since increasing her Lexapro dose her anxiety is actually been much better controlled.  She still notes some occasional anxiety but it is not debilitating and she feels that the medication has been working well she denies any known side effects.  Her only main complaint today is occasional headaches she notes that starts with some tension in her back goes up her neck to the back of her head and occasionally feels like a squeezing feeling.  She usually takes Tylenol for this and it helps some.  She had worked with physical therapy but had not yet started to incorporate the printed exercises into her daily routine.  She she also complains of some itching of her eyes and postnasal drip.  She previously has taken some Allegra from time to time but unsure whether it was effective she does not think she took it with any sort of regularity.     Objective:     BP 129/80 (BP Location: Left Arm, Patient Position: Sitting, Cuff Size: Normal)   Pulse 63   Temp 98.4 F (36.9 C) (Oral)   Ht 5\' 3"  (1.6 m)   Wt 175 lb 12.8 oz (79.7 kg)   SpO2 99% Comment: RA  BMI 31.14 kg/m  BP Readings from Last 3 Encounters:  10/25/23 129/80  03/29/23 132/72  02/08/23 (!) 140/65   Wt Readings from Last 3 Encounters:  10/25/23 175 lb 12.8 oz (79.7 kg)  10/10/23 160 lb (72.6 kg)  03/29/23 156 lb 8 oz (71 kg)      Physical Exam Vitals and nursing note reviewed.  Constitutional:      Appearance: Normal appearance.  HENT:     Nose:     Comments: Enlarged inferior turbinates    Mouth/Throat:     Mouth: Mucous membranes are moist.     Comments: Posterior cobblestoning Eyes:     Comments: Allergic shiners  Cardiovascular:     Rate and  Rhythm: Normal rate and regular rhythm.  Musculoskeletal:     Right lower leg: No edema.     Left lower leg: No edema.  Neurological:     Mental Status: She is alert.  Psychiatric:        Mood and Affect: Mood normal.        Behavior: Behavior normal.      Results for orders placed or performed in visit on 10/25/23  CMP14 + Anion Gap  Result Value Ref Range   Glucose 82 70 - 99 mg/dL   BUN 21 8 - 27 mg/dL   Creatinine, Ser 4.09 0.57 - 1.00 mg/dL   eGFR 60 >81 XB/JYN/8.29   BUN/Creatinine Ratio 21 12 - 28   Sodium 137 134 - 144 mmol/L   Potassium 4.3 3.5 - 5.2 mmol/L   Chloride 98 96 - 106 mmol/L   CO2 21 20 - 29 mmol/L   Anion Gap 18.0 10.0 - 18.0 mmol/L   Calcium 9.8 8.7 - 10.3 mg/dL   Total Protein 7.4 6.0 - 8.5 g/dL   Albumin 4.6 3.8 - 4.8 g/dL   Globulin, Total 2.8 1.5 - 4.5 g/dL   Bilirubin Total 0.5 0.0 - 1.2 mg/dL   Alkaline Phosphatase  104 44 - 121 IU/L   AST 16 0 - 40 IU/L   ALT 7 0 - 32 IU/L  CBC no Diff  Result Value Ref Range   WBC 4.7 3.4 - 10.8 x10E3/uL   RBC 4.56 3.77 - 5.28 x10E6/uL   Hemoglobin 14.1 11.1 - 15.9 g/dL   Hematocrit 52.8 41.3 - 46.6 %   MCV 91 79 - 97 fL   MCH 30.9 26.6 - 33.0 pg   MCHC 33.8 31.5 - 35.7 g/dL   RDW 24.4 01.0 - 27.2 %   Platelets 254 150 - 450 x10E3/uL  Lipid Profile  Result Value Ref Range   Cholesterol, Total 196 100 - 199 mg/dL   Triglycerides 83 0 - 149 mg/dL   HDL 73 >53 mg/dL   VLDL Cholesterol Cal 15 5 - 40 mg/dL   LDL Chol Calc (NIH) 664 (H) 0 - 99 mg/dL   Chol/HDL Ratio 2.7 0.0 - 4.4 ratio    Last CBC Lab Results  Component Value Date   WBC 4.7 10/25/2023   HGB 14.1 10/25/2023   HCT 41.7 10/25/2023   MCV 91 10/25/2023   MCH 30.9 10/25/2023   RDW 12.8 10/25/2023   PLT 254 10/25/2023   Last metabolic panel Lab Results  Component Value Date   GLUCOSE 82 10/25/2023   NA 137 10/25/2023   K 4.3 10/25/2023   CL 98 10/25/2023   CO2 21 10/25/2023   BUN 21 10/25/2023   CREATININE 0.99 10/25/2023    EGFR 60 10/25/2023   CALCIUM 9.8 10/25/2023   PHOS 3.4 08/10/2019   PROT 7.4 10/25/2023   ALBUMIN 4.6 10/25/2023   LABGLOB 2.8 10/25/2023   AGRATIO 1.7 02/09/2022   BILITOT 0.5 10/25/2023   ALKPHOS 104 10/25/2023   AST 16 10/25/2023   ALT 7 10/25/2023   ANIONGAP 9 11/02/2022   Last lipids Lab Results  Component Value Date   CHOL 196 10/25/2023   HDL 73 10/25/2023   LDLCALC 108 (H) 10/25/2023   TRIG 83 10/25/2023   CHOLHDL 2.7 10/25/2023   Last hemoglobin A1c Lab Results  Component Value Date   HGBA1C 6.0 (H) 01/11/2023      The 10-year ASCVD risk score (Arnett DK, et al., 2019) is: 15.9%    Assessment & Plan:   Problem List Items Addressed This Visit       Cardiovascular and Mediastinum   Essential hypertension - Primary (Chronic)   Relevant Orders   CMP14 + Anion Gap (Completed)   CBC no Diff (Completed)   Lipid Profile (Completed)   Aortic atherosclerosis (HCC) (Chronic)   Relevant Orders   Lipid Profile (Completed)     Other   Seasonal allergies (Chronic)   Encouraged her to try over-the-counter Zyrtec I want her to try this 30 days in a row to see if it helps her allergy symptoms.      Generalized anxiety disorder (Chronic)   Anxiety continues to be very well-controlled on Lexapro 20 mg daily.  Again reinforced plan to continue this medication for at least a year at which point we can consider risk and benefits of slow taper off.      Tension headache   Discussed nature of tension headaches and the interplay with the muscles of the neck and back.  Discussed stretching use of heat and range of motion exercises.  Can use Tylenol as needed.       Return in about 6 months (around 04/24/2024).    Gust Rung,  DO

## 2023-10-26 DIAGNOSIS — G44209 Tension-type headache, unspecified, not intractable: Secondary | ICD-10-CM | POA: Insufficient documentation

## 2023-10-26 LAB — CMP14 + ANION GAP
ALT: 7 [IU]/L (ref 0–32)
AST: 16 [IU]/L (ref 0–40)
Albumin: 4.6 g/dL (ref 3.8–4.8)
Alkaline Phosphatase: 104 [IU]/L (ref 44–121)
Anion Gap: 18 mmol/L (ref 10.0–18.0)
BUN/Creatinine Ratio: 21 (ref 12–28)
BUN: 21 mg/dL (ref 8–27)
Bilirubin Total: 0.5 mg/dL (ref 0.0–1.2)
CO2: 21 mmol/L (ref 20–29)
Calcium: 9.8 mg/dL (ref 8.7–10.3)
Chloride: 98 mmol/L (ref 96–106)
Creatinine, Ser: 0.99 mg/dL (ref 0.57–1.00)
Globulin, Total: 2.8 g/dL (ref 1.5–4.5)
Glucose: 82 mg/dL (ref 70–99)
Potassium: 4.3 mmol/L (ref 3.5–5.2)
Sodium: 137 mmol/L (ref 134–144)
Total Protein: 7.4 g/dL (ref 6.0–8.5)
eGFR: 60 mL/min/{1.73_m2} (ref >59)

## 2023-10-26 LAB — LIPID PANEL
Chol/HDL Ratio: 2.7 {ratio} (ref 0.0–4.4)
Cholesterol, Total: 196 mg/dL (ref 100–199)
HDL: 73 mg/dL (ref >39)
LDL Chol Calc (NIH): 108 mg/dL — ABNORMAL HIGH (ref 0–99)
Triglycerides: 83 mg/dL (ref 0–149)
VLDL Cholesterol Cal: 15 mg/dL (ref 5–40)

## 2023-10-26 LAB — CBC
Hematocrit: 41.7 % (ref 34.0–46.6)
Hemoglobin: 14.1 g/dL (ref 11.1–15.9)
MCH: 30.9 pg (ref 26.6–33.0)
MCHC: 33.8 g/dL (ref 31.5–35.7)
MCV: 91 fL (ref 79–97)
Platelets: 254 10*3/uL (ref 150–450)
RBC: 4.56 x10E6/uL (ref 3.77–5.28)
RDW: 12.8 % (ref 11.7–15.4)
WBC: 4.7 10*3/uL (ref 3.4–10.8)

## 2023-10-26 NOTE — Assessment & Plan Note (Signed)
Discussed nature of tension headaches and the interplay with the muscles of the neck and back.  Discussed stretching use of heat and range of motion exercises.  Can use Tylenol as needed.

## 2023-10-26 NOTE — Assessment & Plan Note (Signed)
Anxiety continues to be very well-controlled on Lexapro 20 mg daily.  Again reinforced plan to continue this medication for at least a year at which point we can consider risk and benefits of slow taper off.

## 2023-10-26 NOTE — Assessment & Plan Note (Signed)
Encouraged her to try over-the-counter Zyrtec I want her to try this 30 days in a row to see if it helps her allergy symptoms.

## 2023-11-07 ENCOUNTER — Ambulatory Visit (HOSPITAL_COMMUNITY)
Admission: EM | Admit: 2023-11-07 | Discharge: 2023-11-07 | Disposition: A | Discharge status: Home / Self Care | Payer: 59 | Attending: Physician Assistant | Admitting: Physician Assistant

## 2023-11-07 ENCOUNTER — Encounter (HOSPITAL_COMMUNITY): Payer: Self-pay

## 2023-11-07 DIAGNOSIS — J0100 Acute maxillary sinusitis, unspecified: Secondary | ICD-10-CM

## 2023-11-07 DIAGNOSIS — M546 Pain in thoracic spine: Secondary | ICD-10-CM

## 2023-11-07 LAB — POCT URINALYSIS DIP (MANUAL ENTRY)
Bilirubin, UA: NEGATIVE
Blood, UA: NEGATIVE
Glucose, UA: NEGATIVE mg/dL
Ketones, POC UA: NEGATIVE mg/dL
Leukocytes, UA: NEGATIVE
Nitrite, UA: NEGATIVE
Protein Ur, POC: NEGATIVE mg/dL
Spec Grav, UA: 1.01 (ref 1.010–1.025)
Urobilinogen, UA: 0.2 U/dL
pH, UA: 6 (ref 5.0–8.0)

## 2023-11-07 MED ORDER — LIDOCAINE 5 % EX PTCH: 1.0000 | MEDICATED_PATCH | CUTANEOUS | 0 refills | Status: AC

## 2023-11-07 MED ORDER — FLUTICASONE PROPIONATE 50 MCG/ACT NA SUSP: 1.0000 | Freq: Every day | NASAL | 0 refills | Status: DC

## 2023-11-07 MED ORDER — AMOXICILLIN-POT CLAVULANATE 500-125 MG PO TABS: 1.0000 | ORAL_TABLET | Freq: Two times a day (BID) | ORAL | 0 refills | Status: DC

## 2023-11-07 NOTE — ED Triage Notes (Signed)
 Back pain, bloating, gas, productive cough, and runny nose X 8 days. States the back pain started in the mid back around [place] on and off. Now going into the right lower back.   Nasal congestion and productive cough onset 1 week ago. Mucus is yellow. No known sick exposure. Patient has not taken any otc cold meds. Patient does have history of allergies.

## 2023-11-07 NOTE — ED Provider Notes (Signed)
 [facility]    CSN: [id number] Arrival date & time: 11/07/23  1045      History   Chief Complaint Chief Complaint  Patient presents with   Back Pain   Abdominal Pain    HPI Eileen Cameron is a 73 y.o. female.   Patient presents today with several concerns.  Her primary concern today is intermittent right flank pain.  Symptoms began on 10/30/2023 and were exacerbated by certain movements.  She has been taking Tylenol  which provided some improvement but continues to have intermittent discomfort.  Denies any change in her activity level or injury prior to symptom onset.  She reports that pain is localized to her right thoracic back with radiation around to her abdomen, described as sharp, worse with certain movements, no alleviating factors identified.  She denies any associated fever, nausea, vomiting, diarrhea, changes in her bowel habits.  Last bowel movement was earlier today and was normal.  She denies any significant abdominal surgery and still has her gallbladder and appendix.  She has not noticed any association with oral intake.  She denies any urinary symptoms.  Denies history of nephrolithiasis.  She reports her symptoms have gradually been improving but she still wanted to have them evaluated.  In addition, she reports a week plus long history of URI symptoms including sinus pressure, congestion, intermittent cough.  Denies any fever, chest pain, shortness of breath.  She does have a history of allergies for which she is prescribed cetirizine  but has not been taking this regularly.  She has not tried over-the-counter medication for symptom management.  Denies any known sick contacts.  She denies any recent antibiotics or steroids.  She is concerned because her symptoms have persisted and are worsening so she is concerned that she has a sinus infection.  Blood pressure is elevated today.  Patient reports she took her blood pressure little bit later today and wonders if this  could have contributed to the elevated reading today.  She denies any chest pain, shortness of breath, headache, vision change, dizziness.    Past Medical History:  Diagnosis Date   Adrenal incidentaloma (HCC) 07/08/2015   1.0 by 1.4 cm lateral limb left adrenal mass on CT abdomen on 07/08/15 Followed up in April 2018, stable no further imaging required.   Allergy    seasonal allergies, ace inhibitors--angioedema   Anxiety    Hyperlipidemia    Hypertension    Leg cramps 11/18/2015   Mold exposure 01/29/2020   Neck pain, musculoskeletal 12/27/2017   Post-nasal drip 10/10/2016   Right shoulder pain 04/20/2016    Patient Active Problem List   Diagnosis Date Noted   Tension headache 10/26/2023   Allergic rhinitis 01/11/2023   Aortic atherosclerosis (HCC) 01/10/2023   Gastritis, Helicobacter pylori 12/12/2022   Auditory hallucination 11/02/2022   Mild cognitive impairment 09/15/2022   Piriformis syndrome, left 05/11/2022   Muscle spasm of back 02/09/2022   Iron deficiency 02/09/2022   Venous insufficiency 12/11/2021   Dyspepsia 10/08/2021   Diuretic-induced hypokalemia 10/08/2021   Headache in back of head 09/21/2021   Generalized anxiety disorder 07/14/2021   Gastroenteritis 01/24/2021   MDD (major depressive disorder), recurrent episode, moderate (HCC) 08/25/2019   Bilateral temporomandibular joint pain 05/01/2019   Palpitations 02/18/2018   Visual floaters, bilateral 02/18/2018   Primary osteoarthritis involving multiple joints 02/14/2018   Obstructive sleep apnea of adult 01/22/2018   Rhinitis, chronic 01/22/2018   Neck pain, musculoskeletal 12/27/2017   Vertigo 12/27/2017   Obesity (  BMI 30.0-34.9) 08/09/2017   Vitamin D  insufficiency 08/07/2017   Myalgia due to statin 02/08/2017   Risk for coronary artery disease between 10% and 20% in next 10 years 08/11/2016   History of statin therapy 08/10/2016   Encounter for immunization 07/18/2016   GERD (gastroesophageal  reflux disease) 11/28/2014   Pruritus 10/16/2014   Insomnia 09/18/2014   Seasonal allergies 12/31/2013   Fatigue due to sleep pattern disturbance 05/13/2013   Hot flashes 04/08/2013   Preventative health care 08/26/2012   Essential hypertension 04/01/2009   Lumbar back pain with radiculopathy affecting right lower extremity 04/01/2009    Past Surgical History:  Procedure Laterality Date   COLONOSCOPY N/A 01/08/2013   Procedure: COLONOSCOPY;  Surgeon: Elsie Cree, MD;  Location: [facility];  Service: Endoscopy;  Laterality: N/A;   ECTOPIC PREGNANCY SURGERY  1970s   IR RADIOLOGY PERIPHERAL GUIDED IV START  07/02/2018   IR US  GUIDE VASC ACCESS RIGHT  07/02/2018   right breast abscess drainage     TONSILLECTOMY     TUBAL LIGATION      OB History   No obstetric history on file.      Home Medications    Prior to Admission medications   Medication Sig Start Date End Date Taking? Authorizing Provider  acetaminophen  (TYLENOL ) 500 MG tablet Take 500 mg by mouth every 6 (six) hours as needed for headache.   Yes [provider]  amoxicillin -clavulanate (AUGMENTIN ) 500-125 MG tablet Take 1 tablet by mouth in the morning and at bedtime. 11/07/23  Yes Victorya Hillman, Rocky POUR, PA-C  cetirizine  (ZYRTEC  ALLERGY) 10 MG tablet Take 1 tablet (10 mg total) by mouth daily. 10/25/23 10/24/24 Yes Rosan Dayton BROCKS, DO  Cholecalciferol (VITAMIN D3 PO) Take 1 tablet by mouth daily.   Yes [provider]  Cyanocobalamin  (VITAMIN B-12 PO) Take by mouth.   Yes [provider]  diclofenac  Sodium (VOLTAREN ) 1 % GEL Apply 4 g topically 4 (four) times daily. 03/11/20  Yes Rosan Dayton BROCKS, DO  escitalopram  (LEXAPRO ) 20 MG tablet Take 1 tablet (20 mg total) by mouth daily. 03/06/23  Yes Rosan Dayton BROCKS, DO  fluticasone  (FLONASE ) 50 MCG/ACT nasal spray Place 1 spray into both nostrils daily. 11/07/23  Yes Lasonia Casino K, PA-C  lidocaine  (LIDODERM ) 5 % Place 1 patch onto the skin daily. Remove & Discard  patch within 12 hours or as directed by MD 11/07/23  Yes Axavier Pressley K, PA-C  meclizine  (ANTIVERT ) 25 MG tablet TAKE 1 TABLET(25 MG) BY MOUTH THREE TIMES DAILY AS NEEDED FOR DIZZINESS 11/27/22  Yes Rosan Dayton BROCKS, DO  metoprolol  tartrate (LOPRESSOR ) 100 MG tablet Take 1 tablet by mouth two hours prior to scan 11/29/22  Yes Nahser, Aleene PARAS, MD  Olmesartan -amLODIPine -HCTZ 40-5-25 MG TABS Take 1 tablet by mouth daily. 11/30/22  Yes Rosan Dayton BROCKS, DO  Omega-3 Fatty Acids (FISH OIL PO) Take 1 capsule by mouth daily.   Yes [provider]  Pyridoxine HCl (VITAMIN B-6 PO) Take by mouth.   Yes [provider]  rosuvastatin  (CRESTOR ) 10 MG tablet Take 1 tablet (10 mg total) by mouth daily. 01/12/23 01/12/24 Yes Rosan Dayton BROCKS, DO  UNABLE TO FIND daily. Med Name: Ashwagandha Vitamin 2 daily   Yes [provider]  vitamin C (ASCORBIC ACID) 500 MG tablet Take 500 mg by mouth daily.   Yes [provider]  zinc gluconate 50 MG tablet Take 50 mg by mouth every other day.   Yes [provider]    Family History Family History  Problem Relation Age of Onset   Heart disease Mother 34   Diabetes Mother    Hypertension Sister    Hypertension Sister    Hypertension Sister    Hypertension Brother    Hypertension Brother    Heart disease Maternal Grandmother    Migraines Granddaughter     Social History Social History   Tobacco Use   Smoking status: Never    Passive exposure: Never   Smokeless tobacco: Never  Vaping Use   Vaping status: Never Used  Substance Use Topics   Alcohol use: No    Alcohol/week: 0.0 standard drinks of alcohol   Drug use: No     Allergies   Ace inhibitors and Benadryl  [diphenhydramine ]   Review of Systems Review of Systems  Constitutional:  Positive for activity change. Negative for appetite change, fatigue and fever.  HENT:  Positive for congestion, postnasal drip and sinus pressure. Negative for sneezing and sore throat.    Respiratory:  Positive for cough. Negative for shortness of breath.   Cardiovascular:  Negative for chest pain.  Gastrointestinal:  Negative for abdominal pain, diarrhea, nausea and vomiting.  Genitourinary:  Positive for flank pain. Negative for dysuria, frequency and urgency.  Musculoskeletal:  Positive for back pain. Negative for arthralgias and myalgias.  Neurological:  Negative for dizziness, light-headedness and headaches.     Physical Exam Triage Vital Signs ED Triage Vitals  Encounter Vitals Group     BP 11/07/23 1112 (!) 161/79     Systolic BP Percentile --      Diastolic BP Percentile --      Pulse Rate 11/07/23 1112 67     Resp 11/07/23 1112 18     Temp 11/07/23 1112 97.6 F (36.4 C)     Temp Source 11/07/23 1112 Oral     SpO2 11/07/23 1112 96 %     Weight 11/07/23 1111 174 lb (78.9 kg)     Height 11/07/23 1111 5' 3 (1.6 m)     Head Circumference --      Peak Flow --      Pain Score 11/07/23 1109 4     Pain Loc --      Pain Education --      Exclude from Growth Chart --    No data found.  Updated Vital Signs BP 136/73 (BP Location: Left Arm)   Pulse 64   Temp 97.6 F (36.4 C) (Oral)   Resp 18   Ht 5' 3 (1.6 m)   Wt 174 lb (78.9 kg)   SpO2 96%   BMI 30.82 kg/m   Visual Acuity Right Eye Distance:   Left Eye Distance:   Bilateral Distance:    Right Eye Near:   Left Eye Near:    Bilateral Near:     Physical Exam Vitals reviewed.  Constitutional:      General: She is awake. She is not in acute distress.    Appearance: Normal appearance. She is well-developed. She is not ill-appearing.     Comments: Very pleasant female appears stated age in no acute distress sitting comfortably in exam room  HENT:     Head: Normocephalic and atraumatic.     Right Ear: Tympanic membrane, ear canal and external ear normal. Tympanic membrane is not erythematous or bulging.     Left Ear: Tympanic membrane, ear canal and external ear normal. Tympanic membrane is not  erythematous or bulging.  Nose: Nose normal.     Right Sinus: No maxillary sinus tenderness or frontal sinus tenderness.     Left Sinus: No maxillary sinus tenderness or frontal sinus tenderness.     Mouth/Throat:     Mouth: Mucous membranes are moist.     Pharynx: Uvula midline. Postnasal drip present. No oropharyngeal exudate or posterior oropharyngeal erythema.  Cardiovascular:     Rate and Rhythm: Normal rate and regular rhythm.     Heart sounds: Normal heart sounds, S1 normal and S2 normal. No murmur heard. Pulmonary:     Effort: Pulmonary effort is normal.     Breath sounds: Normal breath sounds. No wheezing, rhonchi or rales.     Comments: Clear to auscultation bilaterally Abdominal:     General: Bowel sounds are normal.     Palpations: Abdomen is soft.     Tenderness: There is no abdominal tenderness. There is no right CVA tenderness, left CVA tenderness, guarding or rebound. Negative signs include Murphy's sign.     Comments: Benign abdominal exam  Musculoskeletal:     Cervical back: No tenderness or bony tenderness.     Thoracic back: No spasms, tenderness or bony tenderness.     Lumbar back: No tenderness or bony tenderness.     Comments: Back: No pain percussion of vertebrae.  Nontender to palpation of paraspinal muscles.  No deformity or step-off noted.  Normal active range of motion.  Skin:    General: Skin is warm.     Findings: No rash.     Comments: No rash on exam  Psychiatric:        Behavior: Behavior is cooperative.      UC Treatments / Results  Labs (all labs ordered are listed, but only abnormal results are displayed) Labs Reviewed  POCT URINALYSIS DIP (MANUAL ENTRY)    EKG   Radiology No results found.  Procedures Procedures (including critical care time)  Medications Ordered in UC Medications - No data to display  Initial Impression / Assessment and Plan / UC Course  I have reviewed the triage vital signs and the nursing  notes.  Pertinent labs & imaging results that were available during my care of the patient were reviewed by me and considered in my medical decision making (see chart for details).     Patient is well-appearing, afebrile, nontoxic, nontachycardic.  Vital signs and physical exam are reassuring with no indication for emergent evaluation or imaging.  Urine was obtained and was normal with no evidence of infection or blood concerning for nephrolithiasis.  Low suspicion for intra-abdominal etiology given she had no tenderness on exam and has no ongoing GI symptoms.  Suspect musculoskeletal etiology of back pain and she was encouraged to continue Tylenol  as well as use lidocaine  patch.  Discussed that she is to apply 1 lidocaine  patch during the day and then remove this at night using only 1 patch per 24 hours.  Additional workup including blood work was deferred given her benign abdominal exam but we did discuss that if her symptoms are not improving quickly or if anything worsens she needs to be seen immediately including fever, abdominal pain, worsening back pain, nausea, vomiting, changes in her bowel habits.  Strict return precautions given.  Given her prolonged and worsening congestion symptoms will cover for secondary bacterial infection.  No indication for viral testing as she has been symptomatic for over a week and this would not change management.  She was started on Augmentin  500 mg /  125 mg twice daily for 7 days.  No indication for dose adjustment based on metabolic panel from 10/25/2023 with creatinine of 0.99 and calculated creatinine clearance of 62.12 mL/min.  She is to use over-the-counter medication including Mucinex  and Flonase  as well as nasal saline sinus rinses.  If her symptoms are not improving quickly or if anything worsens she needs to be seen immediately.  Strict return precautions given.  Recommend close follow-up with primary care.  Blood pressure was initially elevated but this  resolved on recheck.  She was encouraged to follow-up with her PCP regarding ongoing management.  Final Clinical Impressions(s) / UC Diagnoses   Final diagnoses:  Acute right-sided thoracic back pain  Acute non-recurrent maxillary sinusitis     Discharge Instructions      Your urine was normal with no evidence of infection or blood concerning for a kidney stone.  I suspect the symptoms are related to muscle injury.  Use heat and gentle stretch.  You can use lidocaine  patch during the day for symptom relief but remove this at night and use only 1 patch per 24 hours.  You can continue Tylenol  for pain relief.  Follow-up with your primary care if your symptoms are not improving.  We are treating you for a sinus infection.  Start Augmentin  twice daily for 7 days.  Use fluticasone  nasal spray.  I also recommend nasal saline and sinus rinses for additional symptom relief.  If your symptoms are not improving please follow-up with your primary care.     ED Prescriptions     Medication Sig Dispense Auth. Provider   lidocaine  (LIDODERM ) 5 % Place 1 patch onto the skin daily. Remove & Discard patch within 12 hours or as directed by MD 7 patch Canaan Prue K, PA-C   amoxicillin -clavulanate (AUGMENTIN ) 500-125 MG tablet Take 1 tablet by mouth in the morning and at bedtime. 14 tablet Hazelgrace Bonham K, PA-C   fluticasone  (FLONASE ) 50 MCG/ACT nasal spray Place 1 spray into both nostrils daily. 16 g Jodene Polyak K, PA-C      PDMP not reviewed this encounter.   Sherrell Rocky POUR, PA-C 11/07/23 1205

## 2023-11-07 NOTE — Discharge Instructions (Signed)
 Your urine was normal with no evidence of infection or blood concerning for a kidney stone.  I suspect the symptoms are related to muscle injury.  Use heat and gentle stretch.  You can use lidocaine  patch during the day for symptom relief but remove this at night and use only 1 patch per 24 hours.  You can continue Tylenol  for pain relief.  Follow-up with your primary care if your symptoms are not improving.  We are treating you for a sinus infection.  Start Augmentin  twice daily for 7 days.  Use fluticasone  nasal spray.  I also recommend nasal saline and sinus rinses for additional symptom relief.  If your symptoms are not improving please follow-up with your primary care.

## 2023-11-12 ENCOUNTER — Other Ambulatory Visit: Payer: Self-pay

## 2023-11-12 MED ORDER — OLMESARTAN-AMLODIPINE-HCTZ 40-5-25 MG PO TABS: 1.0000 | ORAL_TABLET | Freq: Every day | ORAL | 3 refills | Status: DC

## 2023-11-12 NOTE — Telephone Encounter (Signed)
 Medication sent to pharmacy

## 2023-12-27 ENCOUNTER — Other Ambulatory Visit: Payer: Self-pay

## 2023-12-27 MED ORDER — ROSUVASTATIN CALCIUM 10 MG PO TABS: 10.0000 mg | ORAL_TABLET | Freq: Every day | ORAL | 3 refills | Status: AC

## 2023-12-27 NOTE — Telephone Encounter (Signed)
 Medication sent to pharmacy

## 2024-02-11 ENCOUNTER — Ambulatory Visit: Payer: Self-pay

## 2024-02-11 NOTE — Telephone Encounter (Signed)
 Chief Complaint: Lower leg swelling, feet swelling both legs/feet Symptoms: Burning pain 6-7/10  Frequency: Swelling a couple of day, burning last week Pertinent Negatives: Patient denies other symptoms Disposition: [] ED /[] Urgent Care (no appt availability in office) / [x] Appointment(In office/virtual)/ []  [place] [facility]/ [] Home Care/ [] Refused Recommended Disposition /[] [place] [facility]/ []  Follow-up with PCP Additional Notes: Advised OV today, she was not able to come today due to transportation, asked for tomorrow or Wednesday. Called [facility] and spoke to [place] who was able to schedule for tomorrow at 1415, patient agreed.   Copied from CRM [id number]. Topic: Clinical - Red Word Triage >> Feb 11, 2024  9:37 AM Philippa Chester F wrote: [facility] that prompted transfer to Nurse Triage: burning legs; experiencing a little fluid in her legs; sensation starts in her knees and goes down to her ankles Reason for Disposition  [1] MODERATE leg swelling (e.g., swelling extends up to knees) AND [2] new-onset or worsening  Answer Assessment - Initial Assessment Questions 1. ONSET: "When did the swelling start?" (e.g., minutes, hours, days)     Noticed a couple of days ago 2. LOCATION: "What part of the leg is swollen?"  "Are both legs swollen or just one leg?"     Both legs below the knee and both feet 3. SEVERITY: "How bad is the swelling?" (e.g., localized; mild, moderate, severe)   - Localized: Small area of swelling localized to one leg.   - MILD pedal edema: Swelling limited to foot and ankle, pitting edema < 1/4 inch (6 mm) deep, rest and elevation eliminate most or all swelling.   - MODERATE edema: Swelling of lower leg to knee, pitting edema > 1/4 inch (6 mm) deep, rest and elevation only partially reduce swelling.   - SEVERE edema: Swelling extends above knee, facial or hand swelling present.      Moderate 4. REDNESS: "Does the swelling look red or infected?"     No 5. PAIN: "Is the  swelling painful to touch?" If Yes, ask: "How painful is it?"   (Scale 1-10; mild, moderate or severe)     6-7 6. FEVER: "Do you have a fever?" If Yes, ask: "What is it, how was it measured, and when did it start?"      No 7. CAUSE: "What do you think is causing the leg swelling?"     I don't know 8. MEDICAL HISTORY: "Do you have a history of blood clots (e.g., DVT), cancer, heart failure, kidney disease, or liver failure?"     No 9. RECURRENT SYMPTOM: "Have you had leg swelling before?" If Yes, ask: "When was the last time?" "What happened that time?"     No 10. OTHER SYMPTOMS: "Do you have any other symptoms?" (e.g., chest pain, difficulty breathing)     No, sometimes chest feels tight but not at this time  Protocols used: Leg Swelling and Edema-A-AH

## 2024-02-12 ENCOUNTER — Encounter: Payer: Self-pay | Admitting: Student

## 2024-02-12 ENCOUNTER — Ambulatory Visit: Admitting: Student

## 2024-02-12 VITALS — BP 151/89 | HR 87 | Ht 63.0 in | Wt 183.1 lb

## 2024-02-12 DIAGNOSIS — I872 Venous insufficiency (chronic) (peripheral): Secondary | ICD-10-CM | POA: Diagnosis not present

## 2024-02-12 DIAGNOSIS — Z8739 Personal history of other diseases of the musculoskeletal system and connective tissue: Secondary | ICD-10-CM | POA: Diagnosis not present

## 2024-02-12 NOTE — Assessment & Plan Note (Signed)
 Patient endorses having bilateral lower extremity burning.  She states this started a week ago.  However per chart review, patient has been dealing with this for quite some time.  Patient states it is intermittent.  She states she has tried creams for this and it helps sometimes.  She states sometimes the covers hurt her as well when she is sleeping.  She states at night it gets worse.  She denies any rashes.  She denies any falls.  She denies any hiking, gardening, sexual activity.  On my exam, patient has no rashes.  On my exam patient has no tenderness to palpation.  Patient does not have diabetes so this is less likely diabetic induced neuropathy.  Given history do not think this is related to Lyme disease or syphilis.  Do not think this is related to vitamin deficiencies as patient is on supplementation for B6 and B12.  Will further characterize this with nerve conduction study.  Plan: -Obtain nerve conduction study

## 2024-02-12 NOTE — Assessment & Plan Note (Signed)
 Patient has a longstanding history of venous insufficiency.  She reports today having lower extremity swelling intermittently.  She denies using her compression stockings.  I encouraged her to use them.  She states she occasionally has burning sensation of her lower extremities.  Objectively today, she does have trace edema to bilateral lower extremities.  No pain on palpation.  Reviewed imaging which shows patient does have venous reflux.  Encourage patient to continue using compression stockings and elevate legs.  Patient understood this.  Plan: -Continue compression stockings -Continue elevation of lower extremities

## 2024-02-12 NOTE — Patient Instructions (Addendum)
 Eileen Cameron, Reining you for allowing me to take part in your care today.  Here are your instructions.  1. The swelling in your legs is likely in the setting of leaky veins. Please elevated yor legs when you are sitting and wear compression stockings.  2. Regarding the burning sensation I want to get a test to see how the nerves are in your legs.   3. Come back in 1 month and we can see how it is doing.   Thank you, Dr. Allena Katz  If you have any other questions please contact the internal medicine clinic at [phone] If it is after hours, please call the [facility] at [phone] and then ask the person who picks up for the resident on call.

## 2024-02-12 NOTE — Progress Notes (Signed)
 CC: Burning Sensation in legs   HPI:  [name] is a 73 y.o. female with past medical history of hypertension, GERD, back pain, seasonal allergies who presents for leg swelling.  Please see assessment and plan for full HPI  Medications: Pain: Tylenol, Voltaren gel 4 grams 4 times daily, lidocaine patch Seasonal allergies: Zyrtec 10 mg daily, Flonase Vitamin D supplementation Vitamin B 12 supplementation Anxiety: Lexapro 20 mg daily Vertigo: Meclizine 25 mg Hypertension: On losartan-amlodipine-HCTZ 40-5-25 mg daily Hyperlipidemia: Crestor 10 mg, omega-3 fatty acid Vitamin B6 supplement Zinc supplement Vitamin C supplement  Imaging: 08/24/2022: Vascular lower extremity ultrasound no evidence of deep vein thrombosis.  Right leg has a good amount of venous reflux.  12/06/2022: CT coronary Calcium score 9.  Mild plaque in RCA, LVH, left circumflex.  Nonobstructive.  Past Medical History:  Diagnosis Date   Adrenal incidentaloma (HCC) 07/08/2015   1.0 by 1.4 cm lateral limb left adrenal mass on CT abdomen on 07/08/15 Followed up in April 2018, stable no further imaging required.   Allergy    seasonal allergies, ace inhibitors--angioedema   Anxiety    Hyperlipidemia    Hypertension    Leg cramps 11/18/2015   Mold exposure 01/29/2020   Neck pain, musculoskeletal 12/27/2017   Post-nasal drip 10/10/2016   Right shoulder pain 04/20/2016     Current Outpatient Medications:    acetaminophen (TYLENOL) 500 MG tablet, Take 500 mg by mouth every 6 (six) hours as needed for headache., Disp: , Rfl:    amoxicillin-clavulanate (AUGMENTIN) 500-125 MG tablet, Take 1 tablet by mouth in the morning and at bedtime., Disp: 14 tablet, Rfl: 0   cetirizine (ZYRTEC ALLERGY) 10 MG tablet, Take 1 tablet (10 mg total) by mouth daily., Disp: , Rfl:    Cholecalciferol (VITAMIN D3 PO), Take 1 tablet by mouth daily., Disp: , Rfl:    Cyanocobalamin (VITAMIN B-12 PO), Take by mouth., Disp: , Rfl:     diclofenac Sodium (VOLTAREN) 1 % GEL, Apply 4 g topically 4 (four) times daily., Disp: 100 g, Rfl: 3   escitalopram (LEXAPRO) 20 MG tablet, Take 1 tablet (20 mg total) by mouth daily., Disp: 90 tablet, Rfl: 3   fluticasone (FLONASE) 50 MCG/ACT nasal spray, Place 1 spray into both nostrils daily., Disp: 16 g, Rfl: 0   lidocaine (LIDODERM) 5 %, Place 1 patch onto the skin daily. Remove & Discard patch within 12 hours or as directed by MD, Disp: 7 patch, Rfl: 0   meclizine (ANTIVERT) 25 MG tablet, TAKE 1 TABLET(25 MG) BY MOUTH THREE TIMES DAILY AS NEEDED FOR DIZZINESS, Disp: 30 tablet, Rfl: 0   metoprolol tartrate (LOPRESSOR) 100 MG tablet, Take 1 tablet by mouth two hours prior to scan, Disp: 1 tablet, Rfl: 0   Olmesartan-amLODIPine-HCTZ 40-5-25 MG TABS, Take 1 tablet by mouth daily., Disp: 90 tablet, Rfl: 3   Omega-3 Fatty Acids (FISH OIL PO), Take 1 capsule by mouth daily., Disp: , Rfl:    Pyridoxine HCl (VITAMIN B-6 PO), Take by mouth., Disp: , Rfl:    rosuvastatin (CRESTOR) 10 MG tablet, Take 1 tablet (10 mg total) by mouth daily., Disp: 90 tablet, Rfl: 3   UNABLE TO FIND, daily. Med Name: Ashwagandha Vitamin 2 daily, Disp: , Rfl:    vitamin C (ASCORBIC ACID) 500 MG tablet, Take 500 mg by mouth daily., Disp: , Rfl:    zinc gluconate 50 MG tablet, Take 50 mg by mouth every other day., Disp: , Rfl:   Review of  Systems:    MSK: Patient endorses bilateral lower extremity burning  Physical Exam:  Vitals:   02/12/24 1356  BP: (!) 151/89  Pulse: 87  SpO2: 100%  Weight: 183 lb 1.6 oz (83.1 kg)  Height: 5\' 3"  (1.6 m)    General: Patient is sitting comfortably in the room  Cardio: Regular rate and rhythm, no murmurs, rubs or gallops Pulmonary: Clear to ausculation bilaterally with no rales, rhonchi, and crackles  MSK: Bilateral lower extremities with trace edema present.  No rashes appreciated to the bilateral lower extremities.  2+ pedal pulses.  No tenderness on palpation to bilateral lower  extremities.   Assessment & Plan:   Venous insufficiency Patient has a longstanding history of venous insufficiency.  She reports today having lower extremity swelling intermittently.  She denies using her compression stockings.  I encouraged her to use them.  She states she occasionally has burning sensation of her lower extremities.  Objectively today, she does have trace edema to bilateral lower extremities.  No pain on palpation.  Reviewed imaging which shows patient does have venous reflux.  Encourage patient to continue using compression stockings and elevate legs.  Patient understood this.  Plan: -Continue compression stockings -Continue elevation of lower extremities  History of burning pain in leg Patient endorses having bilateral lower extremity burning.  She states this started a week ago.  However per chart review, patient has been dealing with this for quite some time.  Patient states it is intermittent.  She states she has tried creams for this and it helps sometimes.  She states sometimes the covers hurt her as well when she is sleeping.  She states at night it gets worse.  She denies any rashes.  She denies any falls.  She denies any hiking, gardening, sexual activity.  On my exam, patient has no rashes.  On my exam patient has no tenderness to palpation.  Patient does not have diabetes so this is less likely diabetic induced neuropathy.  Given history do not think this is related to Lyme disease or syphilis.  Do not think this is related to vitamin deficiencies as patient is on supplementation for B6 and B12.  Will further characterize this with nerve conduction study.  Plan: -Obtain nerve conduction study  Patient discussed with Dr.  Milagros Evener, DO PGY-2 Internal Medicine Resident  Pager: [phone]

## 2024-02-13 NOTE — Progress Notes (Signed)
 Internal Medicine Clinic Attending  Case discussed with the resident at the time of the visit.  We reviewed the resident's history and exam and pertinent patient test results.  I agree with the assessment, diagnosis, and plan of care documented in the resident's note.

## 2024-03-14 ENCOUNTER — Encounter: Admitting: Student

## 2024-03-25 ENCOUNTER — Ambulatory Visit: Payer: Self-pay

## 2024-03-25 NOTE — Telephone Encounter (Signed)
  Chief Complaint: left shoulder pain Symptoms: moderate left shoulder pain radiates to the left arm, left neck pain Frequency: x months Pertinent Negatives: Patient denies numbness, weakness, chest pain, rash, fever Disposition: [] ED /[] Urgent Care (no appt availability in office) / [x] Appointment(In office/virtual)/ []  [facility]/ [] Home Care/ [] Refused Recommended Disposition /[] [facility]/ []  Follow-up with PCP Additional Notes: Patient requesting appt with PCP, scheduled for acute visit this week. Advised patient to call back for new or worsening symptoms, patient verbalizes understanding.  Copied from CRM [id number]. Topic: Clinical - Red Word Triage >> Mar 25, 2024 10:31 AM Tisa Forester wrote: Red Word that prompted transfer to Nurse Triage:  Been going on for awhile neck and arm on left side hurt when lift arms up for example to take off a shirt it painful rate 8  the pain comes and goes Patient call back number [phone] Reason for Disposition  [1] MODERATE pain (e.g., interferes with normal activities) AND [2] present > 3 days  Answer Assessment - Initial Assessment Questions 1. ONSET: "When did the pain start?"     More than 2 months.  2. LOCATION: "Where is the pain located?"     Left shoulder and radiates down to left arm.  3. PAIN: "How bad is the pain?" (Scale 1-10; or mild, moderate, severe)   - MILD (1-3): doesn't interfere with normal activities   - MODERATE (4-7): interferes with normal activities (e.g., work or school) or awakens from sleep   - SEVERE (8-10): excruciating pain, unable to do any normal activities, unable to move arm at all due to pain     7-8/10. Intermittent/comes and goes.  4. WORK OR EXERCISE: "Has there been any recent work or exercise that involved this part of the body?"     She states she goes to the grocery store and lifts the water bottles and heavy items by herself.  5. CAUSE: "What do you think is causing the shoulder  pain?"     Unsure, she states the pain is worse when lifting heavy objects.  6. OTHER SYMPTOMS: "Do you have any other symptoms?" (e.g., neck pain, swelling, rash, fever, numbness, weakness)     Left neck pain.  7. PREGNANCY: "Is there any chance you are pregnant?" "When was your last menstrual period?"     N/A.  Protocols used: Shoulder Pain-A-AH

## 2024-03-25 NOTE — Telephone Encounter (Signed)
 Pt's appt is 03/27/24.

## 2024-03-27 ENCOUNTER — Encounter: Payer: Self-pay | Admitting: Internal Medicine

## 2024-03-27 ENCOUNTER — Ambulatory Visit (INDEPENDENT_AMBULATORY_CARE_PROVIDER_SITE_OTHER): Admitting: Internal Medicine

## 2024-03-27 VITALS — BP 142/71 | HR 75 | Temp 98.1°F | Ht 63.0 in | Wt 184.2 lb

## 2024-03-27 DIAGNOSIS — M7542 Impingement syndrome of left shoulder: Secondary | ICD-10-CM | POA: Insufficient documentation

## 2024-03-27 DIAGNOSIS — J302 Other seasonal allergic rhinitis: Secondary | ICD-10-CM | POA: Diagnosis not present

## 2024-03-27 DIAGNOSIS — I10 Essential (primary) hypertension: Secondary | ICD-10-CM

## 2024-03-27 MED ORDER — LIDOCAINE HCL (PF) 1 % IJ SOLN
2.0000 mL | Freq: Once | INTRAMUSCULAR | Status: AC
  Administered 2024-03-27: 2 mL

## 2024-03-27 MED ORDER — TRIAMCINOLONE ACETONIDE 40 MG/ML IJ SUSP
40.0000 mg | Freq: Once | INTRAMUSCULAR | Status: AC
  Administered 2024-03-27: 40 mg via INTRA_ARTICULAR

## 2024-03-27 MED ORDER — FLUTICASONE PROPIONATE 50 MCG/ACT NA SUSP: 1.0000 | Freq: Every day | NASAL | 3 refills | Status: AC

## 2024-03-27 NOTE — Progress Notes (Signed)
 Established Patient Office Visit  Subjective   Patient ID: Eileen Cameron, female    DOB: [birth year]  Age: 73 y.o. MRN: [id number]  Chief Complaint  Patient presents with   Left shoulder pain     Esp. With lifting; getting worse.   Eileen Cameron presents today for left shoulder pain.  She notes over the last few months it has been getting worse.  She went to physical therapy for neck pain but was also treated for her left shoulder in November and December she notes some improvement when she was doing those exercises.  She notes lately it has been worse with reaching overhead for items.  Also worse at night when laying on her left side.  She has not used any medications.  She did try capsaicin cream that provided mild relief but ended up getting some of it in her mouth and eyes.  She has Voltaren  gel for her knees but did not try that on her shoulder.  She was seen at urgent care in January and treated for a bacterial sinusitis.  Also prescribed Flonase  has not been using it.  Notes pressure in her forehead and under her eyes, nasal drainage.  Has not been consistent with Zyrtec  use  Has been taking her antihypertensive as prescribed.     Objective:     BP (!) 142/71 (BP Location: Left Arm, Patient Position: Sitting, Cuff Size: Normal)   Pulse 75   Temp 98.1 F (36.7 C) (Oral)   Ht 5\' 3"  (1.6 m)   Wt 184 lb 3.2 oz (83.6 kg)   SpO2 100% Comment: RA  BMI 32.63 kg/m  BP Readings from Last 3 Encounters:  03/27/24 (!) 142/71  02/12/24 (!) 151/89  11/07/23 136/73   Wt Readings from Last 3 Encounters:  03/27/24 184 lb 3.2 oz (83.6 kg)  02/12/24 183 lb 1.6 oz (83.1 kg)  11/07/23 174 lb (78.9 kg)      Physical Exam Vitals and nursing note reviewed.  Constitutional:      General: She is not in acute distress. HENT:     Nose:     Comments: Swollen inferior turbinates    Mouth/Throat:     Comments: Cobblestoning of oropharynx Pulmonary:     Effort: Pulmonary effort is normal.   Musculoskeletal:     Right shoulder: No swelling or effusion. Normal range of motion.     Left shoulder: Tenderness present. No swelling or effusion. Decreased range of motion.     Right lower leg: No edema.     Left lower leg: No edema.  Psychiatric:        Mood and Affect: Mood normal.      No results found for any visits on 03/27/24.  Last CBC Lab Results  Component Value Date   WBC 4.7 10/25/2023   HGB 14.1 10/25/2023   HCT 41.7 10/25/2023   MCV 91 10/25/2023   MCH 30.9 10/25/2023   RDW 12.8 10/25/2023   PLT 254 10/25/2023   Last metabolic panel Lab Results  Component Value Date   GLUCOSE 82 10/25/2023   NA 137 10/25/2023   K 4.3 10/25/2023   CL 98 10/25/2023   CO2 21 10/25/2023   BUN 21 10/25/2023   CREATININE 0.99 10/25/2023   EGFR 60 10/25/2023   CALCIUM  9.8 10/25/2023   PHOS 3.4 08/10/2019   PROT 7.4 10/25/2023   ALBUMIN 4.6 10/25/2023   LABGLOB 2.8 10/25/2023   AGRATIO 1.7 02/09/2022   BILITOT 0.5 10/25/2023  ALKPHOS 104 10/25/2023   AST 16 10/25/2023   ALT 7 10/25/2023   ANIONGAP 9 11/02/2022      The 10-year ASCVD risk score (Arnett DK, et al., 2019) is: 19.3%    Assessment & Plan:   Problem List Items Addressed This Visit       Cardiovascular and Mediastinum   Essential hypertension (Chronic)   BP has been historically well-controlled very mildly elevated today.  No changes planned.        Musculoskeletal and Integument   Impingement syndrome of left shoulder - Primary   Discussed rotator cuff tendinopathy and impingement syndrome.  Discussed using Voltaren  gel.  Offered referral back to physical therapy patient declined.  Patient interested in steroid injection which was completed today.  PROCEDURE NOTE  PROCEDURE: left shoulder joint steroid injection.  PREOPERATIVE DIAGNOSIS: Bursitis of the left shoulder.  POSTOPERATIVE DIAGNOSIS: Bursitis of the left shoulder.  PROCEDURE: The patient was apprised of the risks and the  benefits of the procedure and informed consent was obtained. Time-out procedure was performed, with confirmation of the patient's name, date of birth, and correct identification of the left shoulder to be injected. The patient's shoulder was then marked at the appropriate site for injection placement. The shoulder was sterilely prepped with Betadine. A 40 mg (1 milliliter) solution of Kenalog  was drawn up into a 3 mL syringe with a 2 mL of 1% lidocaine . The patient was injected with a 25 gauge needle at the lateral  aspect of her  left shoulder. There were no complications. The patient tolerated the procedure well. There was minimal bleeding. The patient was instructed to ice her shoulder upon leaving clinic and refrain from overuse over the next 3 days. The patient was instructed to go to the emergency room with any usual pain, swelling, or redness occurred in the injected area. The patient was given a followup appointment to evaluate response to the injection to his increased range of motion and reduction of pain.           Other   Seasonal allergies (Chronic)   Reinforced taking Zyrtec  daily and use of intranasal corticosteroid-Flonase        Return in about 3 months (around 06/27/2024) for HTN.    Priscella Brooms, DO

## 2024-03-27 NOTE — Assessment & Plan Note (Signed)
 Reinforced taking Zyrtec  daily and use of intranasal corticosteroid-Flonase 

## 2024-03-27 NOTE — Assessment & Plan Note (Signed)
 BP has been historically well-controlled very mildly elevated today.  No changes planned.

## 2024-03-27 NOTE — Assessment & Plan Note (Signed)
 Discussed rotator cuff tendinopathy and impingement syndrome.  Discussed using Voltaren  gel.  Offered referral back to physical therapy patient declined.  Patient interested in steroid injection which was completed today.  PROCEDURE NOTE  PROCEDURE: left shoulder joint steroid injection.  PREOPERATIVE DIAGNOSIS: Bursitis of the left shoulder.  POSTOPERATIVE DIAGNOSIS: Bursitis of the left shoulder.  PROCEDURE: The patient was apprised of the risks and the benefits of the procedure and informed consent was obtained. Time-out procedure was performed, with confirmation of the patient's name, date of birth, and correct identification of the left shoulder to be injected. The patient's shoulder was then marked at the appropriate site for injection placement. The shoulder was sterilely prepped with Betadine. A 40 mg (1 milliliter) solution of Kenalog  was drawn up into a 3 mL syringe with a 2 mL of 1% lidocaine . The patient was injected with a 25 gauge needle at the lateral  aspect of her  left shoulder. There were no complications. The patient tolerated the procedure well. There was minimal bleeding. The patient was instructed to ice her shoulder upon leaving clinic and refrain from overuse over the next 3 days. The patient was instructed to go to the emergency room with any usual pain, swelling, or redness occurred in the injected area. The patient was given a followup appointment to evaluate response to the injection to his increased range of motion and reduction of pain.

## 2024-05-06 ENCOUNTER — Other Ambulatory Visit: Payer: Self-pay | Admitting: Internal Medicine

## 2024-05-06 MED ORDER — ESCITALOPRAM OXALATE 20 MG PO TABS: 20.0000 mg | ORAL_TABLET | Freq: Every day | ORAL | 3 refills | Status: AC

## 2024-05-06 NOTE — Telephone Encounter (Signed)
 Copied from CRM [id number]. Topic: Clinical - Medication Refill >> May 06, 2024  9:31 AM Cherylann S wrote: Medication: escitalopram  (LEXAPRO ) 20 MG tablet  Has the patient contacted their pharmacy? Yes (Agent: If no, request that the patient contact the pharmacy for the refill. If patient does not wish to contact the pharmacy document the reason why and proceed with request.) (Agent: If yes, when and what did the pharmacy advise?)  This is the patient's preferred pharmacy:  [id number] [place], [place] - [address] AT [facility] OF [facility] & [address] [place] [address] Phone: [phone] Fax: [phone]  Is this the correct pharmacy for this prescription? Yes If no, delete pharmacy and type the correct one.   Has the prescription been filled recently? No  Is the patient out of the medication? No 5 days remaining  Has the patient been seen for an appointment in the last year OR does the patient have an upcoming appointment? Yes  Can we respond through MyChart? Yes  Agent: Please be advised that Rx refills may take up to 3 business days. We ask that you follow-up with your pharmacy.

## 2024-06-16 ENCOUNTER — Ambulatory Visit: Payer: Self-pay | Admitting: Internal Medicine

## 2024-06-23 ENCOUNTER — Encounter: Payer: Self-pay | Admitting: Internal Medicine

## 2024-06-23 ENCOUNTER — Ambulatory Visit (INDEPENDENT_AMBULATORY_CARE_PROVIDER_SITE_OTHER): Payer: Self-pay | Admitting: Internal Medicine

## 2024-06-23 VITALS — BP 127/70 | HR 61 | Temp 97.9°F | Ht 63.0 in | Wt 189.8 lb

## 2024-06-23 DIAGNOSIS — G8929 Other chronic pain: Secondary | ICD-10-CM | POA: Diagnosis not present

## 2024-06-23 DIAGNOSIS — I10 Essential (primary) hypertension: Secondary | ICD-10-CM

## 2024-06-23 DIAGNOSIS — I700 Atherosclerosis of aorta: Secondary | ICD-10-CM | POA: Diagnosis not present

## 2024-06-23 DIAGNOSIS — E66811 Obesity, class 1: Secondary | ICD-10-CM

## 2024-06-23 DIAGNOSIS — Z131 Encounter for screening for diabetes mellitus: Secondary | ICD-10-CM

## 2024-06-23 DIAGNOSIS — M7542 Impingement syndrome of left shoulder: Secondary | ICD-10-CM | POA: Diagnosis not present

## 2024-06-23 DIAGNOSIS — J302 Other seasonal allergic rhinitis: Secondary | ICD-10-CM

## 2024-06-23 DIAGNOSIS — F331 Major depressive disorder, recurrent, moderate: Secondary | ICD-10-CM

## 2024-06-23 DIAGNOSIS — R7303 Prediabetes: Secondary | ICD-10-CM | POA: Diagnosis not present

## 2024-06-23 DIAGNOSIS — M542 Cervicalgia: Secondary | ICD-10-CM | POA: Diagnosis not present

## 2024-06-23 DIAGNOSIS — E559 Vitamin D deficiency, unspecified: Secondary | ICD-10-CM

## 2024-06-23 DIAGNOSIS — E669 Obesity, unspecified: Secondary | ICD-10-CM

## 2024-06-23 DIAGNOSIS — Z6833 Body mass index (BMI) 33.0-33.9, adult: Secondary | ICD-10-CM

## 2024-06-23 MED ORDER — SPIRONOLACTONE 25 MG PO TABS: 12.5000 mg | ORAL_TABLET | Freq: Every day | ORAL | 3 refills | Status: AC

## 2024-06-23 NOTE — Assessment & Plan Note (Signed)
 Still has some signs of left shoulder impingment but steroid injection was not helpful.  She did [facility] at [facility] [address] last year for left shoulder pain and neck pain which was benefital.  We discussed that pain may be referred from her neck, will obtain Xrays of neck and shoulder and discuss potential referral back to PT.

## 2024-06-23 NOTE — Assessment & Plan Note (Signed)
 Positive spurlings on left. I suspect a degree of this shoulder pain may be coming from the neck, we have not had Xrays of her neck and with start with obtaining plain films.

## 2024-06-23 NOTE — Patient Instructions (Addendum)
 I am going send you for Xray of your neck and shoulder.  We can discuss the results and potentially going back to physical therapy.  You can get the Xrays at [facility] at [address] [place], [place], [place] [address]  their number is [phone]  I am starting you on a new blood pressure medication you will take a half of a pill every day (spironolactone ).

## 2024-06-23 NOTE — Assessment & Plan Note (Signed)
 Encouraged resumption of at least zyrtec , she is considering restarting using netty pot nasal irrigation
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
## 2024-06-23 NOTE — Assessment & Plan Note (Signed)
 BP elevated over last several visits.  Discussed adding low dose spironolactone  12.5mg  daily.  BP at end of visit did improve but I think we may keep with the addition given her overall BP trend.

## 2024-06-24 LAB — HEMOGLOBIN A1C
Est. average glucose Bld gHb Est-mCnc: 120 mg/dL
Hgb A1c MFr Bld: 5.8 % — ABNORMAL HIGH (ref 4.8–5.6)

## 2024-06-24 LAB — LIPID PANEL
Chol/HDL Ratio: 3.2 ratio (ref 0.0–4.4)
Cholesterol, Total: 212 mg/dL — ABNORMAL HIGH (ref 100–199)
HDL: 67 mg/dL (ref >39)
LDL Chol Calc (NIH): 132 mg/dL — ABNORMAL HIGH (ref 0–99)
Triglycerides: 76 mg/dL (ref 0–149)
VLDL Cholesterol Cal: 13 mg/dL (ref 5–40)

## 2024-06-24 LAB — BASIC METABOLIC PANEL WITH GFR
BUN/Creatinine Ratio: 24 (ref 12–28)
BUN: 26 mg/dL (ref 8–27)
CO2: 21 mmol/L (ref 20–29)
Calcium: 9.8 mg/dL (ref 8.7–10.3)
Chloride: 101 mmol/L (ref 96–106)
Creatinine, Ser: 1.07 mg/dL — ABNORMAL HIGH (ref 0.57–1.00)
Glucose: 89 mg/dL (ref 70–99)
Potassium: 4.6 mmol/L (ref 3.5–5.2)
Sodium: 137 mmol/L (ref 134–144)
eGFR: 54 mL/min/1.73 — ABNORMAL LOW (ref >59)

## 2024-06-24 LAB — VITAMIN D 25 HYDROXY (VIT D DEFICIENCY, FRACTURES): Vit D, 25-Hydroxy: 28.8 ng/mL — ABNORMAL LOW (ref 30.0–100.0)

## 2024-06-30 ENCOUNTER — Other Ambulatory Visit: Payer: Self-pay | Admitting: Internal Medicine

## 2024-06-30 ENCOUNTER — Telehealth: Payer: Self-pay | Admitting: *Deleted

## 2024-06-30 DIAGNOSIS — Z1231 Encounter for screening mammogram for malignant neoplasm of breast: Secondary | ICD-10-CM

## 2024-06-30 NOTE — Telephone Encounter (Signed)
 Called pt - stated she has been waiting for [facility] to call and schedule her x-rays. Informed pt appt is not needed. Stated she will go tomorrow am.

## 2024-06-30 NOTE — Telephone Encounter (Signed)
 Copied from CRM #[id number]. Topic: Clinical - Request for Lab/Test Order >> Jun 30, 2024 10:53 AM Diannia H wrote: Reason for CRM: Patient had some orders put in for imaging on 08/18 and has not received a call. Could you assist? Patients callback number is [phone].

## 2024-07-01 ENCOUNTER — Ambulatory Visit
Admission: RE | Admit: 2024-07-01 | Discharge: 2024-07-01 | Disposition: A | Discharge status: Home / Self Care | Source: Ambulatory Visit | Attending: Internal Medicine | Admitting: Internal Medicine

## 2024-07-01 DIAGNOSIS — M7542 Impingement syndrome of left shoulder: Secondary | ICD-10-CM

## 2024-07-01 DIAGNOSIS — M25512 Pain in left shoulder: Secondary | ICD-10-CM | POA: Diagnosis not present

## 2024-07-01 DIAGNOSIS — G8929 Other chronic pain: Secondary | ICD-10-CM

## 2024-07-01 DIAGNOSIS — M19012 Primary osteoarthritis, left shoulder: Secondary | ICD-10-CM | POA: Diagnosis not present

## 2024-07-01 DIAGNOSIS — M4312 Spondylolisthesis, cervical region: Secondary | ICD-10-CM | POA: Diagnosis not present

## 2024-07-01 DIAGNOSIS — M4722 Other spondylosis with radiculopathy, cervical region: Secondary | ICD-10-CM | POA: Diagnosis not present

## 2024-07-24 ENCOUNTER — Ambulatory Visit

## 2024-08-12 ENCOUNTER — Ambulatory Visit
Admission: RE | Admit: 2024-08-12 | Discharge: 2024-08-12 | Disposition: A | Discharge status: Home / Self Care | Source: Ambulatory Visit | Attending: Internal Medicine | Admitting: Internal Medicine

## 2024-08-12 DIAGNOSIS — Z1231 Encounter for screening mammogram for malignant neoplasm of breast: Secondary | ICD-10-CM

## 2024-08-15 ENCOUNTER — Other Ambulatory Visit: Payer: Self-pay

## 2024-08-15 MED ORDER — OLMESARTAN-AMLODIPINE-HCTZ 40-5-25 MG PO TABS: 1.0000 | ORAL_TABLET | Freq: Every day | ORAL | 3 refills | Status: AC

## 2024-08-15 NOTE — Telephone Encounter (Signed)
 Pharmacy requesting additional refills to place on file.  Medication sent to pharmacy.

## 2024-09-08 ENCOUNTER — Ambulatory Visit: Admission: EM | Admit: 2024-09-08 | Discharge: 2024-09-08 | Disposition: A | Discharge status: Home / Self Care

## 2024-09-08 ENCOUNTER — Encounter: Payer: Self-pay | Admitting: Emergency Medicine

## 2024-09-08 DIAGNOSIS — B349 Viral infection, unspecified: Secondary | ICD-10-CM | POA: Diagnosis not present

## 2024-09-08 DIAGNOSIS — Z1211 Encounter for screening for malignant neoplasm of colon: Secondary | ICD-10-CM | POA: Insufficient documentation

## 2024-09-08 DIAGNOSIS — J300 Vasomotor rhinitis: Secondary | ICD-10-CM | POA: Insufficient documentation

## 2024-09-08 DIAGNOSIS — H1045 Other chronic allergic conjunctivitis: Secondary | ICD-10-CM | POA: Insufficient documentation

## 2024-09-08 LAB — POC COVID19/FLU A&B COMBO
Covid Antigen, POC: NEGATIVE
Influenza A Antigen, POC: NEGATIVE
Influenza B Antigen, POC: NEGATIVE

## 2024-09-08 LAB — POCT RAPID STREP A (OFFICE): Rapid Strep A Screen: NEGATIVE

## 2024-09-08 MED ORDER — AZELASTINE HCL 0.1 % NA SOLN: 1.0000 | Freq: Two times a day (BID) | NASAL | 1 refills | Status: AC

## 2024-09-08 MED ORDER — PROMETHAZINE-DM 6.25-15 MG/5ML PO SYRP: 10.0000 mL | ORAL_SOLUTION | Freq: Three times a day (TID) | ORAL | 0 refills | Status: AC | PRN

## 2024-09-08 NOTE — ED Provider Notes (Signed)
 [facility]  Note:  This document was prepared using Conservation officer, historic buildings and may include unintentional dictation errors.  MRN: [id number] DOB: 11-15-1950  Subjective:   Eileen Cameron is a 73 y.o. female presenting for productive cough, nasal congestion, postnasal drip, sore throat x 4 days.  Patient denies any fever/chills, vomiting, abdominal pain, shortness of breath, chest pain, body aches.  Patient reports that her grandson was sick with cold-like symptoms for a few days before her symptom onset.  Patient has been doing salt water gargles and taking Alka-Seltzer cold tablets with minimal improvement of her symptoms.  Patient has not taken any other over-the-counter medication to treat symptoms prior to arrival in urgent care today.  No current facility-administered medications for this encounter.  Current Outpatient Medications:    azelastine (ASTELIN) 0.1 % nasal spray, Place 1 spray into both nostrils 2 (two) times daily. Use in each nostril as directed, Disp: 30 mL, Rfl: 1   cetirizine  (ZYRTEC  ALLERGY) 10 MG tablet, Take 1 tablet (10 mg total) by mouth daily., Disp: , Rfl:    escitalopram  (LEXAPRO ) 20 MG tablet, Take 1 tablet (20 mg total) by mouth daily., Disp: 90 tablet, Rfl: 3   fluticasone  (FLONASE ) 50 MCG/ACT nasal spray, Place 1 spray into both nostrils daily., Disp: 16 g, Rfl: 3   Olmesartan -amLODIPine -HCTZ 40-5-25 MG TABS, Take 1 tablet by mouth daily., Disp: 90 tablet, Rfl: 3   promethazine-dextromethorphan (PROMETHAZINE-DM) 6.25-15 MG/5ML syrup, Take 10 mLs by mouth 3 (three) times daily as needed., Disp: 240 mL, Rfl: 0   acetaminophen  (TYLENOL ) 500 MG tablet, Take 500 mg by mouth every 6 (six) hours as needed for headache., Disp: , Rfl:    amLODipine  (NORVASC ) 10 MG tablet, 1/2 Tablet Orally Once a day; Duration: 30 day(s) (Patient not taking: Reported on 09/08/2024), Disp: , Rfl:    amLODipine -olmesartan  (AZOR ) 5-40 MG tablet, TAKE 1 TABLET BY MOUTH  DAILY Oral; Duration: 90 (Patient not taking: Reported on 09/08/2024), Disp: , Rfl:    Cholecalciferol (VITAMIN D3 PO), Take 1 tablet by mouth daily., Disp: , Rfl:    Coenzyme Q10 (Q-SORB CO Q-10) 100 MG capsule, 1 capsule with a meal Orally Once a day; Duration: 30 day(s), Disp: , Rfl:    Cyanocobalamin  (VITAMIN B-12 PO), Take by mouth., Disp: , Rfl:    diclofenac  Sodium (VOLTAREN ) 1 % GEL, Apply 4 g topically 4 (four) times daily., Disp: 100 g, Rfl: 3   doxycycline  (MONODOX ) 100 MG capsule, 1 capsule. (Patient not taking: Reported on 09/08/2024), Disp: , Rfl:    lidocaine  (LIDODERM ) 5 %, Place 1 patch onto the skin daily. Remove & Discard patch within 12 hours or as directed by MD (Patient not taking: Reported on 09/08/2024), Disp: 7 patch, Rfl: 0   losartan -hydrochlorothiazide  (HYZAAR) 100-25 MG tablet, 1 tablet Orally Once a day; Duration: 30 day(s) (Patient not taking: Reported on 09/08/2024), Disp: , Rfl:    meclizine  (ANTIVERT ) 25 MG tablet, TAKE 1 TABLET(25 MG) BY MOUTH THREE TIMES DAILY AS NEEDED FOR DIZZINESS (Patient not taking: Reported on 09/08/2024), Disp: 30 tablet, Rfl: 0   mometasone  (ELOCON ) 0.1 % cream, APPLY TO EXTERNAL EAR ONCE A DAY AS NEEDED ITCHING External; Duration: 20, Disp: , Rfl:    Olopatadine HCl (PATADAY) 0.2 % SOLN, 1 drop into affected eye Ophthalmic Once a day; Duration: 30 days (Patient not taking: Reported on 09/08/2024), Disp: , Rfl:    Omega-3 Fatty Acids (FISH OIL PO), Take 1 capsule by mouth daily., Disp: ,  Rfl:    omeprazole  (PRILOSEC) 20 MG capsule, 1 capsule 30 minutes before morning meal Orally Once a day; Duration: 30 day(s), Disp: , Rfl:    Pyridoxine HCl (VITAMIN B-6 PO), Take by mouth. (Patient not taking: Reported on 09/08/2024), Disp: , Rfl:    rosuvastatin  (CRESTOR ) 10 MG tablet, Take 1 tablet (10 mg total) by mouth daily. (Patient not taking: Reported on 09/08/2024), Disp: 90 tablet, Rfl: 3   sertraline  (ZOLOFT ) 50 MG tablet, Oral; Duration: 30 (Patient not  taking: Reported on 09/08/2024), Disp: , Rfl:    spironolactone  (ALDACTONE ) 25 MG tablet, Take 0.5 tablets (12.5 mg total) by mouth daily. (Patient not taking: Reported on 09/08/2024), Disp: 45 tablet, Rfl: 3   UNABLE TO FIND, daily. Med Name: Ashwagandha Vitamin 2 daily (Patient not taking: Reported on 09/08/2024), Disp: , Rfl:    venlafaxine  XR (EFFEXOR -XR) 37.5 MG 24 hr capsule, Oral for 30 (Patient not taking: Reported on 09/08/2024), Disp: , Rfl:    vitamin C (ASCORBIC ACID) 500 MG tablet, Take 500 mg by mouth daily., Disp: , Rfl:    zinc gluconate 50 MG tablet, Take 50 mg by mouth every other day., Disp: , Rfl:    Allergies  Allergen Reactions   Ace Inhibitors Swelling   Benadryl  [Diphenhydramine ] Anxiety    Past Medical History:  Diagnosis Date   Adrenal incidentaloma 07/08/2015   1.0 by 1.4 cm lateral limb left adrenal mass on CT abdomen on 07/08/15 Followed up in April 2018, stable no further imaging required.   Allergy    seasonal allergies, ace inhibitors--angioedema   Anxiety    Hyperlipidemia    Hypertension    Leg cramps 11/18/2015   Mold exposure 01/29/2020   Neck pain, musculoskeletal 12/27/2017   Post-nasal drip 10/10/2016   Right shoulder pain 04/20/2016     Past Surgical History:  Procedure Laterality Date   COLONOSCOPY N/A 01/08/2013   Procedure: COLONOSCOPY;  Surgeon: Elsie Cree, MD;  Location: [facility];  Service: Endoscopy;  Laterality: N/A;   ECTOPIC PREGNANCY SURGERY  1970s   IR RADIOLOGY PERIPHERAL GUIDED IV START  07/02/2018   IR US  GUIDE VASC ACCESS RIGHT  07/02/2018   right breast abscess drainage     TONSILLECTOMY     TUBAL LIGATION      Family History  Problem Relation Age of Onset   Heart disease Mother 75   Diabetes Mother    Hypertension Sister    Hypertension Sister    Hypertension Sister    Heart disease Maternal Grandmother    Hypertension Brother    Hypertension Brother    Migraines Granddaughter    Breast cancer Neg Hx      Social History   Tobacco Use   Smoking status: Never    Passive exposure: Never   Smokeless tobacco: Never  Vaping Use   Vaping status: Never Used  Substance Use Topics   Alcohol use: No    Alcohol/week: 0.0 standard drinks of alcohol   Drug use: No    ROS Refer to HPI for ROS details.  Objective:   Vitals: BP 137/84 (BP Location: Right Arm)   Pulse 75   Temp 97.8 F (36.6 C) (Oral)   Resp 18   SpO2 93%   Physical Exam Vitals and nursing note reviewed.  Constitutional:      General: She is not in acute distress.    Appearance: Normal appearance. She is well-developed. She is not ill-appearing or toxic-appearing.  HENT:  Head: Normocephalic and atraumatic.     Mouth/Throat:     Mouth: Mucous membranes are moist.     Pharynx: Oropharynx is clear. No oropharyngeal exudate or posterior oropharyngeal erythema.  Cardiovascular:     Rate and Rhythm: Normal rate and regular rhythm.     Heart sounds: Normal heart sounds. No murmur heard. Pulmonary:     Effort: Pulmonary effort is normal. No respiratory distress.     Breath sounds: Normal breath sounds. No stridor. No wheezing, rhonchi or rales.  Chest:     Chest wall: No tenderness.  Skin:    General: Skin is warm and dry.  Neurological:     General: No focal deficit present.     Mental Status: She is alert and oriented to person, place, and time.  Psychiatric:        Mood and Affect: Mood normal.        Behavior: Behavior normal.     Procedures  Results for orders placed or performed during the hospital encounter of 09/08/24 (from the past 24 hours)  POCT rapid strep A     Status: Normal   Collection Time: 09/08/24 12:07 PM  Result Value Ref Range   Rapid Strep A Screen Negative Negative  POC Covid19/Flu A&B Antigen     Status: Normal   Collection Time: 09/08/24 12:07 PM  Result Value Ref Range   Influenza A Antigen, POC Negative Negative   Influenza B Antigen, POC Negative Negative   Covid  Antigen, POC Negative Negative    No results found.   Assessment and Plan :     Discharge Instructions       1. Acute viral syndrome (Primary) - POCT rapid strep A complete UC is negative - POC Covid19/Flu A&B Antigen in UC is negative - azelastine (ASTELIN) 0.1 % nasal spray; Place 1 spray into both nostrils 2 (two) times daily. Use in each nostril as directed  Dispense: 30 mL; Refill: 1 - promethazine-dextromethorphan (PROMETHAZINE-DM) 6.25-15 MG/5ML syrup; Take 10 mLs by mouth 3 (three) times daily as needed.  Dispense: 240 mL; Refill: 0 -Continue to monitor symptoms for any change in severity if there is any escalation of current symptoms or development of new symptoms follow-up in ER for further evaluation and management.       Chiniqua Kilcrease B Gargi Berch   Daysi Boggan, [place] B, [place] 09/08/24 1228

## 2024-09-08 NOTE — Discharge Instructions (Signed)
  1. Acute viral syndrome (Primary) - POCT rapid strep A complete UC is negative - POC Covid19/Flu A&B Antigen in UC is negative - azelastine (ASTELIN) 0.1 % nasal spray; Place 1 spray into both nostrils 2 (two) times daily. Use in each nostril as directed  Dispense: 30 mL; Refill: 1 - promethazine-dextromethorphan (PROMETHAZINE-DM) 6.25-15 MG/5ML syrup; Take 10 mLs by mouth 3 (three) times daily as needed.  Dispense: 240 mL; Refill: 0 -Continue to monitor symptoms for any change in severity if there is any escalation of current symptoms or development of new symptoms follow-up in ER for further evaluation and management.

## 2024-09-08 NOTE — ED Triage Notes (Signed)
 Pt reports sore throat, productive cough, and nasal congestion x4 days. Denies fevers and chills. Reports her grandchild in daycare was sick a few days before her. Has tried saltwater gargles and alka seltzer cold tablets with no relief.

## 2024-11-21 ENCOUNTER — Ambulatory Visit
Admission: EM | Admit: 2024-11-21 | Discharge: 2024-11-21 | Disposition: A | Discharge status: Home / Self Care | Source: Home / Self Care

## 2024-11-21 ENCOUNTER — Encounter (HOSPITAL_COMMUNITY): Payer: Self-pay | Admitting: Emergency Medicine

## 2024-11-21 ENCOUNTER — Emergency Department (HOSPITAL_COMMUNITY)
Admission: EM | Admit: 2024-11-21 | Discharge: 2024-11-22 | Disposition: A | Discharge status: Home / Self Care | Attending: Emergency Medicine | Admitting: Emergency Medicine

## 2024-11-21 ENCOUNTER — Other Ambulatory Visit: Payer: Self-pay

## 2024-11-21 DIAGNOSIS — R55 Syncope and collapse: Secondary | ICD-10-CM

## 2024-11-21 DIAGNOSIS — E871 Hypo-osmolality and hyponatremia: Secondary | ICD-10-CM | POA: Insufficient documentation

## 2024-11-21 LAB — URINALYSIS, ROUTINE W REFLEX MICROSCOPIC
Bacteria, UA: NONE SEEN
Bilirubin Urine: NEGATIVE
Glucose, UA: NEGATIVE mg/dL
Hgb urine dipstick: NEGATIVE
Ketones, ur: NEGATIVE mg/dL
Nitrite: NEGATIVE
Protein, ur: NEGATIVE mg/dL
Specific Gravity, Urine: 1.006 (ref 1.005–1.030)
pH: 6 (ref 5.0–8.0)

## 2024-11-21 LAB — COMPREHENSIVE METABOLIC PANEL WITH GFR
ALT: <5 U/L (ref 0–44)
AST: 22 U/L (ref 15–41)
Albumin: 4.5 g/dL (ref 3.5–5.0)
Alkaline Phosphatase: 87 U/L (ref 38–126)
Anion gap: 14 (ref 5–15)
BUN: 18 mg/dL (ref 8–23)
CO2: 23 mmol/L (ref 22–32)
Calcium: 9.6 mg/dL (ref 8.9–10.3)
Chloride: 96 mmol/L — ABNORMAL LOW (ref 98–111)
Creatinine, Ser: 1.03 mg/dL — ABNORMAL HIGH (ref 0.44–1.00)
GFR, Estimated: 56 mL/min — ABNORMAL LOW
Glucose, Bld: 105 mg/dL — ABNORMAL HIGH (ref 70–99)
Potassium: 3.6 mmol/L (ref 3.5–5.1)
Sodium: 133 mmol/L — ABNORMAL LOW (ref 135–145)
Total Bilirubin: 0.6 mg/dL (ref 0.0–1.2)
Total Protein: 8.1 g/dL (ref 6.5–8.1)

## 2024-11-21 LAB — CBC
HCT: 41.5 % (ref 36.0–46.0)
Hemoglobin: 13.6 g/dL (ref 12.0–15.0)
MCH: 30 pg (ref 26.0–34.0)
MCHC: 32.8 g/dL (ref 30.0–36.0)
MCV: 91.6 fL (ref 80.0–100.0)
Platelets: 261 K/uL (ref 150–400)
RBC: 4.53 MIL/uL (ref 3.87–5.11)
RDW: 13.6 % (ref 11.5–15.5)
WBC: 6.9 K/uL (ref 4.0–10.5)
nRBC: 0 % (ref 0.0–0.2)

## 2024-11-21 LAB — TROPONIN T, HIGH SENSITIVITY
Troponin T High Sensitivity: <15 ng/L (ref 0–19)
Troponin T High Sensitivity: <15 ng/L (ref 0–19)

## 2024-11-21 LAB — CBG MONITORING, ED: Glucose-Capillary: 108 mg/dL — ABNORMAL HIGH (ref 70–99)

## 2024-11-21 NOTE — ED Triage Notes (Signed)
 Patient coming to ED for evaluation of syncopal episode after having a BM today.  Reports she was seen at Urgent Care and referred her for evaluation.  States her stomach began to cramp and she felt sick.  Went to bathroom and was able to have a BM.  During BM she began to have dizziness.  No reports of nausea or vomiting.  No diarrhea.  After leaving bathroom, dizziness continued and she recalls waking up in the kitchen.  No reports of head injury.  Abdominal cramping has improved.

## 2024-11-21 NOTE — ED Provider Notes (Signed)
 " [facility]    CSN: [id number] Arrival date & time: 11/21/24  1719      History   Chief Complaint Chief Complaint  Patient presents with   Abdominal Pain    HPI Eileen Cameron is a 74 y.o. female.   Patient reports that she had some abdominal cramping earlier today and went to the bathroom.  Patient states that she thinks that she passed out.  Patient states that she woke up on the floor.  She is unsure if she hit her head.  Patient does not recall having any chest pain.  Patient states that she has passed out in the past.  Patient states that she had to crawl to the door to let her grandson in.  Patient denies any current complaints other than still feeling dizzy.  Patient states that she has not been drinking very much fluid.  She states that she ate a yogurt and blueberries today but she has not had very much appetite.  Patient denies any nausea or vomiting she is not currently having any abdominal pain.  Patient denies any chest pain or shortness of breath.  Patient is unsure how long she was on the floor.   Abdominal Pain   Past Medical History:  Diagnosis Date   Adrenal incidentaloma 07/08/2015   1.0 by 1.4 cm lateral limb left adrenal mass on CT abdomen on 07/08/15 Followed up in April 2018, stable no further imaging required.   Allergy    seasonal allergies, ace inhibitors--angioedema   Anxiety    Hyperlipidemia    Hypertension    Leg cramps 11/18/2015   Mold exposure 01/29/2020   Neck pain, musculoskeletal 12/27/2017   Post-nasal drip 10/10/2016   Right shoulder pain 04/20/2016    Patient Active Problem List   Diagnosis Date Noted   Chronic allergic conjunctivitis 09/08/2024   Vasomotor rhinitis 09/08/2024   Encounter for screening for malignant neoplasm of colon 09/08/2024   Prediabetes 06/23/2024   Impingement syndrome of left shoulder 03/27/2024   History of burning pain in leg 02/12/2024   Tension headache 10/26/2023   Allergic rhinitis  01/11/2023   Aortic atherosclerosis 01/10/2023   Gastritis, Helicobacter pylori 12/12/2022   Auditory hallucination 11/02/2022   Mild cognitive impairment 09/15/2022   Piriformis syndrome, left 05/11/2022   Muscle spasm of back 02/09/2022   Iron deficiency 02/09/2022   Venous insufficiency 12/11/2021   Dyspepsia 10/08/2021   Diuretic-induced hypokalemia 10/08/2021   Headache in back of head 09/21/2021   Generalized anxiety disorder 07/14/2021   Gastroenteritis 01/24/2021   MDD (major depressive disorder), recurrent episode, moderate (HCC) 08/25/2019   Bilateral temporomandibular joint pain 05/01/2019   Palpitations 02/18/2018   Visual floaters, bilateral 02/18/2018   Primary osteoarthritis involving multiple joints 02/14/2018   Obstructive sleep apnea of adult 01/22/2018   Rhinitis, chronic 01/22/2018   Neck pain, chronic 12/27/2017   Vertigo 12/27/2017   Obesity (BMI 30.0-34.9) 08/09/2017   Vitamin D  insufficiency 08/07/2017   Myalgia due to statin 02/08/2017   Risk for coronary artery disease between 10% and 20% in next 10 years 08/11/2016   History of statin therapy 08/10/2016   Encounter for immunization 07/18/2016   GERD (gastroesophageal reflux disease) 11/28/2014   Pruritus 10/16/2014   Insomnia 09/18/2014   Seasonal allergies 12/31/2013   Fatigue due to sleep pattern disturbance 05/13/2013   Hot flashes 04/08/2013   Preventative health care 08/26/2012   Benign essential hypertension 04/01/2009   Lumbar back pain with radiculopathy affecting right lower  extremity 04/01/2009    Past Surgical History:  Procedure Laterality Date   COLONOSCOPY N/A 01/08/2013   Procedure: COLONOSCOPY;  Surgeon: Elsie Cree, MD;  Location: [facility];  Service: Endoscopy;  Laterality: N/A;   ECTOPIC PREGNANCY SURGERY  1970s   IR RADIOLOGY PERIPHERAL GUIDED IV START  07/02/2018   IR US  GUIDE VASC ACCESS RIGHT  07/02/2018   right breast abscess drainage     TONSILLECTOMY     TUBAL  LIGATION      OB History   No obstetric history on file.      Home Medications    Prior to Admission medications  Medication Sig Start Date End Date Taking? Authorizing Provider  acetaminophen  (TYLENOL ) 500 MG tablet Take 500 mg by mouth every 6 (six) hours as needed for headache.    [provider]  amLODipine  (NORVASC ) 10 MG tablet 1/2 Tablet Orally Once a day; Duration: 30 day(s) Patient not taking: Reported on 09/08/2024    [provider]  amLODipine -olmesartan  (AZOR ) 5-40 MG tablet TAKE 1 TABLET BY MOUTH DAILY Oral; Duration: 90 Patient not taking: Reported on 09/08/2024    [provider]  azelastine  (ASTELIN ) 0.1 % nasal spray Place 1 spray into both nostrils 2 (two) times daily. Use in each nostril as directed 09/08/24   Reddick, Johnathan B, NP  cetirizine  (ZYRTEC  ALLERGY) 10 MG tablet Take 1 tablet (10 mg total) by mouth daily. 10/25/23 10/24/24  Rosan Dayton BROCKS, DO  Cholecalciferol (VITAMIN D3 PO) Take 1 tablet by mouth daily.    [provider]  Coenzyme Q10 (Q-SORB CO Q-10) 100 MG capsule 1 capsule with a meal Orally Once a day; Duration: 30 day(s)    [provider]  Cyanocobalamin  (VITAMIN B-12 PO) Take by mouth.    [provider]  diclofenac  Sodium (VOLTAREN ) 1 % GEL Apply 4 g topically 4 (four) times daily. 03/11/20   Rosan Dayton BROCKS, DO  doxycycline  (MONODOX ) 100 MG capsule 1 capsule. Patient not taking: Reported on 09/08/2024 09/21/22   [provider]  escitalopram  (LEXAPRO ) 20 MG tablet Take 1 tablet (20 mg total) by mouth daily. 05/06/24   Rosan Dayton BROCKS, DO  fluticasone  (FLONASE ) 50 MCG/ACT nasal spray Place 1 spray into both nostrils daily. 03/27/24   Rosan Dayton BROCKS, DO  lidocaine  (LIDODERM ) 5 % Place 1 patch onto the skin daily. Remove & Discard patch within 12 hours or as directed by MD Patient not taking: Reported on 09/08/2024 11/07/23   Raspet, Erin K, PA-C  losartan -hydrochlorothiazide  (HYZAAR) 100-25  MG tablet 1 tablet Orally Once a day; Duration: 30 day(s) Patient not taking: Reported on 09/08/2024    [provider]  meclizine  (ANTIVERT ) 25 MG tablet TAKE 1 TABLET(25 MG) BY MOUTH THREE TIMES DAILY AS NEEDED FOR DIZZINESS Patient not taking: Reported on 09/08/2024 11/27/22   Rosan Dayton BROCKS, DO  mometasone  (ELOCON ) 0.1 % cream APPLY TO EXTERNAL EAR ONCE A DAY AS NEEDED ITCHING External; Duration: 20    [provider]  Olmesartan -amLODIPine -HCTZ 40-5-25 MG TABS Take 1 tablet by mouth daily. 08/15/24   Rosan Dayton BROCKS, DO  Olopatadine HCl (PATADAY) 0.2 % SOLN 1 drop into affected eye Ophthalmic Once a day; Duration: 30 days Patient not taking: Reported on 09/08/2024    [provider]  Omega-3 Fatty Acids (FISH OIL PO) Take 1 capsule by mouth daily.    [provider]  omeprazole  (PRILOSEC) 20 MG capsule 1 capsule 30 minutes before morning meal Orally Once a  day; Duration: 30 day(s) 09/21/22   [provider]  promethazine -dextromethorphan (PROMETHAZINE -DM) 6.25-15 MG/5ML syrup Take 10 mLs by mouth 3 (three) times daily as needed. 09/08/24   Reddick, Johnathan B, NP  Pyridoxine HCl (VITAMIN B-6 PO) Take by mouth. Patient not taking: Reported on 09/08/2024    [provider]  rosuvastatin  (CRESTOR ) 10 MG tablet Take 1 tablet (10 mg total) by mouth daily. Patient not taking: Reported on 09/08/2024 12/27/23 12/26/24  Rosan Dayton BROCKS, DO  sertraline  (ZOLOFT ) 50 MG tablet Oral; Duration: 30 Patient not taking: Reported on 09/08/2024    [provider]  spironolactone  (ALDACTONE ) 25 MG tablet Take 0.5 tablets (12.5 mg total) by mouth daily. Patient not taking: Reported on 09/08/2024 06/23/24   Rosan Dayton BROCKS, DO  UNABLE TO FIND daily. Med Name: Ashwagandha Vitamin 2 daily Patient not taking: Reported on 09/08/2024    [provider]  venlafaxine  XR (EFFEXOR -XR) 37.5 MG 24 hr capsule Oral for 30 Patient not taking: Reported on 09/08/2024     [provider]  vitamin C (ASCORBIC ACID) 500 MG tablet Take 500 mg by mouth daily.    [provider]  zinc gluconate 50 MG tablet Take 50 mg by mouth every other day.    [provider]    Family History Family History  Problem Relation Age of Onset   Heart disease Mother 77   Diabetes Mother    Hypertension Sister    Hypertension Sister    Hypertension Sister    Heart disease Maternal Grandmother    Hypertension Brother    Hypertension Brother    Migraines Granddaughter    Breast cancer Neg Hx     Social History Social History[1]   Allergies   Ace inhibitors and Benadryl  [diphenhydramine ]   Review of Systems Review of Systems  Gastrointestinal:  Positive for abdominal pain.  All other systems reviewed and are negative.    Physical Exam Triage Vital Signs ED Triage Vitals  Encounter Vitals Group     BP 11/21/24 1810 (!) 145/83     Girls Systolic BP Percentile --      Girls Diastolic BP Percentile --      Boys Systolic BP Percentile --      Boys Diastolic BP Percentile --      Pulse Rate 11/21/24 1810 68     Resp --      Temp 11/21/24 1810 98.4 F (36.9 C)     Temp Source 11/21/24 1810 Oral     SpO2 11/21/24 1810 96 %     Weight --      Height --      Head Circumference --      Peak Flow --      Pain Score 11/21/24 1808 5     Pain Loc --      Pain Education --      Exclude from Growth Chart --    No data found.  Updated Vital Signs BP (!) 145/83 (BP Location: Left Arm)   Pulse 68   Temp 98.4 F (36.9 C) (Oral)   SpO2 96%   Visual Acuity Right Eye Distance:   Left Eye Distance:   Bilateral Distance:    Right Eye Near:   Left Eye Near:    Bilateral Near:     Physical Exam Vitals and nursing note reviewed.  Constitutional:      Appearance: She is well-developed.  HENT:     Head: Normocephalic.  Eyes:  Extraocular Movements: Extraocular movements intact.  Cardiovascular:     Rate and Rhythm: Normal  rate.  Pulmonary:     Effort: Pulmonary effort is normal.  Abdominal:     General: Abdomen is flat. Bowel sounds are normal. There is no distension.     Palpations: Abdomen is soft.     Tenderness: There is no abdominal tenderness.  Musculoskeletal:        General: Normal range of motion.     Cervical back: Normal range of motion.  Skin:    General: Skin is warm.  Neurological:     General: No focal deficit present.     Mental Status: She is alert and oriented to person, place, and time.      UC Treatments / Results  Labs (all labs ordered are listed, but only abnormal results are displayed) Labs Reviewed - No data to display  EKG   Radiology No results found.  Procedures Procedures (including critical care time)  Medications Ordered in UC Medications - No data to display  Initial Impression / Assessment and Plan / UC Course  I have reviewed the triage vital signs and the nursing notes.  Pertinent labs & imaging results that were available during my care of the patient were reviewed by me and considered in my medical decision making (see chart for details).     EKG normal sinus normal EKG. Patient had a syncopal episode today and was on the floor.  Patient is not sure what happened she is unsure if she struck her head. Patient states that her grandson brought her here.  Patient and grandson are advised that she should go to the emergency department for more thorough evaluation. Final Clinical Impressions(s) / UC Diagnoses   Final diagnoses:  Syncope, unspecified syncope type   Discharge Instructions   None    ED Prescriptions   None    PDMP not reviewed this encounter.  An After Visit Summary was printed and given to the patient.     [1]  Social History Tobacco Use   Smoking status: Never    Passive exposure: Never   Smokeless tobacco: Never  Vaping Use   Vaping status: Never Used  Substance Use Topics   Alcohol use: No    Alcohol/week: 0.0  standard drinks of alcohol   Drug use: No     Flint Sonny POUR, PA-C 11/21/24 1840  "

## 2024-11-21 NOTE — ED Triage Notes (Signed)
 Pt reports upper abdominal pain and states felt like I had indigestion up in my chest. She went to the bathroom and had a BM. States she was feeling dizzy. Then I fell out. States she isn't sure how long she was out. Reports her grandson was at the front door and I had to crawl to let him in. States this was about 4 pm. States she is no longer dizzy after lying down.

## 2024-11-21 NOTE — ED Notes (Signed)
 Patient is being discharged from the Urgent Care and sent to the Emergency Department via POV . Per Darice Showers, PA-C, patient is in need of higher level of care due to Syncope. Patient is aware and verbalizes understanding of plan of care.  Vitals:   11/21/24 1810  BP: (!) 145/83  Pulse: 68  Temp: 98.4 F (36.9 C)  SpO2: 96%

## 2024-11-21 NOTE — Discharge Instructions (Signed)
Go to the Emergency department for evaluation

## 2024-11-21 NOTE — ED Provider Notes (Signed)
 " [place] [facility] Provider Note   CSN: [id number] Arrival date & time: 11/21/24  1907     History Chief Complaint  Patient presents with   Loss of Consciousness    HPI Eileen Cameron is a 74 y.o. female presenting for chief complaint of syncopal event.  74 year old female with a history of similar secondary to either anxiety or vasovagal reflex during bowel movements.  States tonight she had a difficult bowel movement and then while standing from the toilet got lightheaded, tried to go to the kitchen to get water before syncopized and completely.  Denies fevers chills nausea vomiting shortness breath.  Had a 5-hour wait tonight states all symptoms are resolved at time of my evaluation..   Patient's recorded medical, surgical, social, medication list and allergies were reviewed in the Snapshot window as part of the initial history.   Review of Systems   Review of Systems  Constitutional:  Negative for chills and fever.  HENT:  Negative for ear pain and sore throat.   Eyes:  Negative for pain and visual disturbance.  Respiratory:  Negative for cough and shortness of breath.   Cardiovascular:  Negative for chest pain and palpitations.  Gastrointestinal:  Negative for abdominal pain and vomiting.  Genitourinary:  Negative for dysuria and hematuria.  Musculoskeletal:  Negative for arthralgias and back pain.  Skin:  Negative for color change and rash.  Neurological:  Positive for syncope. Negative for seizures.  All other systems reviewed and are negative.   Physical Exam Updated Vital Signs BP (!) 149/58 (BP Location: Right Arm)   Pulse 68   Temp 97.7 F (36.5 C) (Oral)   Resp 17   Ht 5' 3 (1.6 m)   Wt 85.7 kg   SpO2 100%   BMI 33.48 kg/m  Physical Exam Vitals and nursing note reviewed.  Constitutional:      General: She is not in acute distress.    Appearance: She is well-developed.  HENT:     Head: Normocephalic and atraumatic.   Eyes:     Conjunctiva/sclera: Conjunctivae normal.  Cardiovascular:     Rate and Rhythm: Normal rate and regular rhythm.     Heart sounds: No murmur heard. Pulmonary:     Effort: Pulmonary effort is normal. No respiratory distress.     Breath sounds: Normal breath sounds.  Abdominal:     General: There is no distension.     Palpations: Abdomen is soft.     Tenderness: There is no abdominal tenderness. There is no right CVA tenderness or left CVA tenderness.  Musculoskeletal:        General: No swelling or tenderness. Normal range of motion.     Cervical back: Neck supple.  Skin:    General: Skin is warm and dry.  Neurological:     General: No focal deficit present.     Mental Status: She is alert and oriented to person, place, and time. Mental status is at baseline.     Cranial Nerves: No cranial nerve deficit.      ED Course/ Medical Decision Making/ A&P    Procedures Procedures   Medications Ordered in ED Medications - No data to display Medical Decision Making:   Eileen Cameron is a 74 y.o. female who presented to the ED today with a syncopal episode detailed above.    Patient placed on continuous vitals and telemetry monitoring while in ED which was reviewed periodically.  Complete initial physical exam  performed, notably the patient  was HDS in NAD.    Reviewed and confirmed nursing documentation for past medical history, family history, social history.    Initial Assessment:   With the patient's presentation of syncope, most likely diagnosis is orthostatic hypotension vs vasovagal episode. Other diagnoses were considered including (but not limited to) arrythmogenic syncope, valvular abnormality, PE, aortic dissection. These are considered less likely due to history of present illness and physical exam findings.   Additionally, patient's history appears more consistent with benign episodes including orthostatic event vs  vagal episode based on prodromal event during the  bowel movement.  Initial Plan:  Screening labs including CBC and Metabolic panel to evaluate for infectious or metabolic etiology of disease.  Urinalysis with reflex culture ordered to evaluate for UTI or relevant urologic/nephrologic pathology.  EKG and serial troponin to evaluate for cardiac pathology. Objective evaluation as below reviewed after administration of IVF/Telemetry monitoring  Initial Study Results:   Laboratory  All laboratory results reviewed without evidence of clinically relevant pathology.     EKG EKG was reviewed independently. Rate, rhythm, axis, intervals all examined and without medically relevant abnormality. ST segments without concerns for elevations.    Final Assessment and Plan:   Patient observed for 5 hours in the emergency room.  On serial reassessments she is minimal to ambulate, tolerate p.o. intake and states that all symptoms have completely resolved.  Patient feels comfortable discharging soon as possible. Recommend she switch to Maalox for better control of her acid reflux symptoms.  Otherwise no acute indication for intervention here in the emergency room.  Clinical Impression:  1. Syncope and collapse      Discharge   Final Clinical Impression(s) / ED Diagnoses Final diagnoses:  Syncope and collapse    Rx / DC Orders ED Discharge Orders          Ordered    alum & mag hydroxide-simeth (MAALOX MAX) 400-400-40 MG/5ML suspension  Every 6 hours PRN        11/22/24 0045              Jerral Meth, MD 11/22/24 0045  "

## 2024-11-21 NOTE — ED Notes (Signed)
 Pt's grandson here to pick pt up. Aware of recommendation that she go to the ED for further evaluation.

## 2024-11-22 MED ORDER — MAALOX MAX 400-400-40 MG/5ML PO SUSP: 15.0000 mL | Freq: Four times a day (QID) | ORAL | 0 refills | Status: AC | PRN

## 2024-11-27 ENCOUNTER — Ambulatory Visit

## 2024-11-27 VITALS — BP 149/81 | HR 72 | Temp 98.2°F | Ht 63.0 in | Wt 180.4 lb

## 2024-11-27 DIAGNOSIS — G3184 Mild cognitive impairment, so stated: Secondary | ICD-10-CM | POA: Diagnosis not present

## 2024-11-27 DIAGNOSIS — R1013 Epigastric pain: Secondary | ICD-10-CM | POA: Diagnosis not present

## 2024-11-27 DIAGNOSIS — R55 Syncope and collapse: Secondary | ICD-10-CM

## 2024-11-27 MED ORDER — OMEPRAZOLE 20 MG PO CPDR: 20.0000 mg | DELAYED_RELEASE_CAPSULE | Freq: Every day | ORAL | 2 refills | Status: AC

## 2024-11-27 NOTE — Progress Notes (Signed)
 "  Established Patient Office Visit  Subjective   Patient ID: Eileen Cameron, female    DOB: 07-30-1951  Age: 74 y.o. MRN: [id number]  Chief Complaint  Patient presents with   Headache   Abdominal Pain   Flank Pain    HPI Patient is a HTN, Bilateral TMJ, GERD, Insomnia, MDD, OSA, obesity, Prediabetes that presents today for ED visit follow up and acute concern of abdominal pain.  See problem based plan and assessment for more details.    ROS See problem based plan and assessment for more details.    Objective:     BP (!) 149/81 (BP Location: Right Arm, Cuff Size: Small)   Pulse 72   Temp 98.2 F (36.8 C) (Oral)   Ht 5' 3 (1.6 m)   Wt 180 lb 6.4 oz (81.8 kg)   SpO2 100%   BMI 31.96 kg/m  BP Readings from Last 3 Encounters:  11/27/24 (!) 149/81  11/22/24 (!) 156/71  11/21/24 (!) 145/83      Physical Exam Eyes:     Extraocular Movements:     Right eye: No nystagmus.     Left eye: No nystagmus.  Cardiovascular:     Rate and Rhythm: Normal rate and regular rhythm.     Heart sounds: No murmur heard. Pulmonary:     Effort: Pulmonary effort is normal. No respiratory distress.  Abdominal:     General: Bowel sounds are normal. There is no distension.     Palpations: Abdomen is soft.     Tenderness: There is no abdominal tenderness. There is no guarding.  Neurological:     Mental Status: She is alert.     Gait: Gait normal.  Psychiatric:        Mood and Affect: Mood normal.      No results found for any visits on 11/27/24.    The 10-year ASCVD risk score (Arnett DK, et al., 2019) is: 21.3%    Assessment & Plan:   Problem List Items Addressed This Visit       Other   Dyspepsia   Mild cognitive impairment - Primary   Relevant Orders   AMB Referral [facility] Care Management   Syncope   Assessment & Plan Mild cognitive impairment Do believe that this is contributing to patient's condition.  Patient is unsure of what medication she is on.  She lives by  herself but does have support of her grandson.  Do think that she would benefit from medication reconciliation sure that these medications are not the cause of her syncope.  Also recommended that she bring in family member with her or have them on the phone for visits.  Plan: [facility] referral for [facility] pharmacy management  Dyspepsia Patient notices that she has episodic epigastric pain that does improve with pepto bismal or maalox. She have a history of chronic episodic diarrhea as well but is unsure which foods these are associated with although she can say that it is with greens and chili beans.  She also endorses excessive flatulence.Has seen GI in the past and has a history of Hpylori for which she stated she took treatment and was retested. She states that at that time she was told that the infection had cleared. She is unsure of the symptoms that she was having around that time. Her abdominal exam is benign with no epigastric tenderness, no guarding or distention noted.   Plan: Continue omeprazole  20 mg( PT unsure if she has this medication, have sent  refills) FODMAP diet given  Counseled patient on journaling GI symptoms, when they occur and what foods. Syncope, unspecified syncope type Patient states that these episodes have happened before.  She is unsure of specific timings but most episodes that she can describe are usually associated around bowel movements, urination which she usually describes with straining. She states that she usually loses consciousness for a few seconds and comes back to without intervention.   She states during 1 episode she started feeling better after Pepto-Bismol.  Orthostatics at this visit were not positive.  She denies any vertigo-like symptoms although she does have a history but has completed vestibular rehab.  In the ED her workup was largely negative, no UTI, troponins negative, no leukocytosis.  Previous cardiac workup showed a coronary calcium  score of 0 with  some mild CAD.  EKG was unrevealing for any arrhythmias or signs of ischemia in the ED.  Personally I have a lower differential for cardiac causes.  Most likely vasovagal in nature although I do wonder if polypharmacy could play a component as well. Patient is unsure of what medicines she is taking.   Plan: Advised patient on not immediately sitting up after using the restroom.  Decluttering the area to avoid falls and having railings nearby to hold onto. Encouraged her to keep a log of when she does feel like this to get a better idea of symptoms and program surrounding. -Consider cardiac monitor or TSH if these symptoms are unresolved    Return in about 3 weeks (around 12/18/2024) for medication managment, BP, and abdominal pain .    Reigan Tolliver D'Mello, DO Patient discussed with Dr. Jeanelle  "

## 2024-11-27 NOTE — Assessment & Plan Note (Signed)
 Patient notices that she has episodic epigastric pain that does improve with pepto bismal or maalox. She have a history of chronic episodic diarrhea as well but is unsure which foods these are associated with although she can say that it is with greens and chili beans.  She also endorses excessive flatulence.Has seen GI in the past and has a history of Hpylori for which she stated she took treatment and was retested. She states that at that time she was told that the infection had cleared. She is unsure of the symptoms that she was having around that time. Her abdominal exam is benign with no epigastric tenderness, no guarding or distention noted.   Plan: Continue omeprazole  20 mg( PT unsure if she has this medication, have sent refills) FODMAP diet given  Counseled patient on journaling GI symptoms, when they occur and what foods.

## 2024-11-27 NOTE — Assessment & Plan Note (Signed)
 Patient states that these episodes have happened before.  She is unsure of specific timings but most episodes that she can describe are usually associated around bowel movements, urination which she usually describes with straining. She states that she usually loses consciousness for a few seconds and comes back to without intervention.   She states during 1 episode she started feeling better after Pepto-Bismol.  Orthostatics at this visit were not positive.  She denies any vertigo-like symptoms although she does have a history but has completed vestibular rehab.  In the ED her workup was largely negative, no UTI, troponins negative, no leukocytosis.  Previous cardiac workup showed a coronary calcium  score of 0 with some mild CAD.  EKG was unrevealing for any arrhythmias or signs of ischemia in the ED.  Personally I have a lower differential for cardiac causes.  Most likely vasovagal in nature although I do wonder if polypharmacy could play a component as well. Patient is unsure of what medicines she is taking.   Plan: Advised patient on not immediately sitting up after using the restroom.  Decluttering the area to avoid falls and having railings nearby to hold onto. Encouraged her to keep a log of when she does feel like this to get a better idea of symptoms and program surrounding. -Consider cardiac monitor or TSH if these symptoms are unresolved

## 2024-11-27 NOTE — Patient Instructions (Addendum)
 Today we discussed the following medical conditions and plan:   For your gas and bloating I have attached a handout about a diet that may help with that.   Start logging when you are feeling the abdominal pain, what foods, any diarrhea that you notice. I have given you a few refills on the prilosec medication.   Also continue to be careful when using the bathroom.Make sure the area is well lit. Sit for a little bit before getting up from the toilet. Try to keep the area uncluttered.   We look forward to seeing you next time. Please call our clinic at [phone] if you have any questions or concerns. The best time to call is Monday-Friday from 9am-4pm, but there is someone available 24/7. If you need medication refills, please notify your pharmacy one week in advance and they will send us  a request   Thank you for trusting me with your care. Wishing you the best!   Candiace West D'Mello, DO  [facility]

## 2024-11-27 NOTE — Assessment & Plan Note (Signed)
 Do believe that this is contributing to patient's condition.  Patient is unsure of what medication she is on.  She lives by herself but does have support of her grandson.  Do think that she would benefit from medication reconciliation sure that these medications are not the cause of her syncope.  Also recommended that she bring in family member with her or have them on the phone for visits.  Plan: [facility] referral for [facility] pharmacy management

## 2024-11-29 NOTE — Progress Notes (Signed)
 Internal Medicine Clinic Attending  Case discussed with the resident at the time of the visit.  We reviewed the resident's history and exam and pertinent patient test results.  I agree with the assessment, diagnosis, and plan of care documented in the resident's note.

## 2024-12-04 ENCOUNTER — Telehealth: Payer: Self-pay | Admitting: *Deleted

## 2024-12-04 NOTE — Progress Notes (Signed)
 Care Guide Pharmacy Note  12/04/2024 Name: Eileen Cameron MRN: [id number] DOB: [birth year]  Referred By: Rosan Dayton BROCKS, DO Reason for referral: Complex Care Management (Initial outreach to schedule referral with Pharmacy )   Eileen Cameron is a 74 y.o. year old female who is a primary care patient of Rosan Dayton BROCKS, DO.  Eileen Cameron was referred to the pharmacist for assistance related to: Mild cognitive impairment and medication adherence   Successful contact was made with the patient to discuss pharmacy services including being ready for the pharmacist to call at least 5 minutes before the scheduled appointment time and to have medication bottles and any blood pressure readings ready for review. The patient agreed to meet with the pharmacist via telephone visit on (date/time).01/01/25 at 1030 AM   Eileen Cameron  [facility] [address], [facility] Guide  Direct Dial: [phone]  Fax [phone]

## 2024-12-29 ENCOUNTER — Ambulatory Visit: Payer: Self-pay | Admitting: Internal Medicine

## 2025-01-01 ENCOUNTER — Other Ambulatory Visit: Payer: Self-pay
